# Patient Record
Sex: Female | Born: 1945 | Race: White | Hispanic: No | Marital: Married | Smoking: Never smoker
Health system: Southern US, Community
[De-identification: ages and names within clinical notes are randomized; demographics above are authoritative.]

## PROBLEM LIST (undated history)

## (undated) DIAGNOSIS — K5730 Diverticulosis of large intestine without perforation or abscess without bleeding: Secondary | ICD-10-CM

## (undated) DIAGNOSIS — E039 Hypothyroidism, unspecified: Secondary | ICD-10-CM

## (undated) DIAGNOSIS — Z9889 Other specified postprocedural states: Secondary | ICD-10-CM

## (undated) DIAGNOSIS — E119 Type 2 diabetes mellitus without complications: Secondary | ICD-10-CM

## (undated) DIAGNOSIS — R351 Nocturia: Secondary | ICD-10-CM

## (undated) DIAGNOSIS — E785 Hyperlipidemia, unspecified: Secondary | ICD-10-CM

## (undated) DIAGNOSIS — J45909 Unspecified asthma, uncomplicated: Secondary | ICD-10-CM

## (undated) DIAGNOSIS — I4510 Unspecified right bundle-branch block: Secondary | ICD-10-CM

## (undated) DIAGNOSIS — M5431 Sciatica, right side: Secondary | ICD-10-CM

## (undated) DIAGNOSIS — Z8639 Personal history of other endocrine, nutritional and metabolic disease: Secondary | ICD-10-CM

## (undated) DIAGNOSIS — Z87440 Personal history of urinary (tract) infections: Secondary | ICD-10-CM

## (undated) DIAGNOSIS — N200 Calculus of kidney: Secondary | ICD-10-CM

## (undated) DIAGNOSIS — K219 Gastro-esophageal reflux disease without esophagitis: Secondary | ICD-10-CM

## (undated) DIAGNOSIS — Z87442 Personal history of urinary calculi: Secondary | ICD-10-CM

## (undated) DIAGNOSIS — R112 Nausea with vomiting, unspecified: Secondary | ICD-10-CM

## (undated) DIAGNOSIS — Z8719 Personal history of other diseases of the digestive system: Secondary | ICD-10-CM

## (undated) DIAGNOSIS — T7840XA Allergy, unspecified, initial encounter: Secondary | ICD-10-CM

## (undated) DIAGNOSIS — R7303 Prediabetes: Secondary | ICD-10-CM

## (undated) DIAGNOSIS — I10 Essential (primary) hypertension: Secondary | ICD-10-CM

## (undated) HISTORY — DX: Gastro-esophageal reflux disease without esophagitis: K21.9

## (undated) HISTORY — PX: TONSILLECTOMY: SUR1361

## (undated) HISTORY — DX: Hyperlipidemia, unspecified: E78.5

## (undated) HISTORY — DX: Calculus of kidney: N20.0

## (undated) HISTORY — DX: Allergy, unspecified, initial encounter: T78.40XA

## (undated) HISTORY — DX: Essential (primary) hypertension: I10

## (undated) HISTORY — DX: Type 2 diabetes mellitus without complications: E11.9

---

## 1971-08-30 HISTORY — PX: TUBAL LIGATION: SHX77

## 1971-08-30 HISTORY — PX: OTHER SURGICAL HISTORY: SHX169

## 1972-08-29 HISTORY — PX: APPENDECTOMY: SHX54

## 1979-08-30 HISTORY — PX: OTHER SURGICAL HISTORY: SHX169

## 1993-08-29 HISTORY — PX: VAGINAL HYSTERECTOMY: SUR661

## 1998-02-04 ENCOUNTER — Other Ambulatory Visit: Admission: RE | Admit: 1998-02-04 | Discharge: 1998-02-04 | Payer: Self-pay | Admitting: *Deleted

## 1998-06-16 ENCOUNTER — Other Ambulatory Visit: Admission: RE | Admit: 1998-06-16 | Discharge: 1998-06-16 | Payer: Self-pay | Admitting: Podiatry

## 1999-04-21 ENCOUNTER — Other Ambulatory Visit: Admission: RE | Admit: 1999-04-21 | Discharge: 1999-04-21 | Payer: Self-pay | Admitting: *Deleted

## 2001-03-27 ENCOUNTER — Other Ambulatory Visit: Admission: RE | Admit: 2001-03-27 | Discharge: 2001-03-27 | Payer: Self-pay | Admitting: Internal Medicine

## 2002-12-28 ENCOUNTER — Encounter: Payer: Self-pay | Admitting: Internal Medicine

## 2003-10-22 ENCOUNTER — Encounter (INDEPENDENT_AMBULATORY_CARE_PROVIDER_SITE_OTHER): Payer: Self-pay | Admitting: Specialist

## 2003-10-22 ENCOUNTER — Ambulatory Visit (HOSPITAL_COMMUNITY): Admission: RE | Admit: 2003-10-22 | Discharge: 2003-10-22 | Payer: Self-pay | Admitting: Internal Medicine

## 2004-11-10 ENCOUNTER — Ambulatory Visit: Payer: Self-pay | Admitting: Internal Medicine

## 2004-11-17 ENCOUNTER — Ambulatory Visit: Payer: Self-pay | Admitting: Internal Medicine

## 2004-11-30 ENCOUNTER — Ambulatory Visit: Payer: Self-pay | Admitting: Internal Medicine

## 2004-12-15 ENCOUNTER — Ambulatory Visit: Payer: Self-pay | Admitting: Internal Medicine

## 2004-12-21 ENCOUNTER — Ambulatory Visit: Payer: Self-pay | Admitting: Internal Medicine

## 2004-12-21 LAB — HM COLONOSCOPY

## 2005-02-16 ENCOUNTER — Ambulatory Visit: Payer: Self-pay | Admitting: Internal Medicine

## 2005-06-01 ENCOUNTER — Ambulatory Visit: Payer: Self-pay | Admitting: Internal Medicine

## 2005-06-15 ENCOUNTER — Ambulatory Visit: Payer: Self-pay | Admitting: Internal Medicine

## 2005-08-01 ENCOUNTER — Ambulatory Visit: Payer: Self-pay | Admitting: Internal Medicine

## 2005-08-10 ENCOUNTER — Ambulatory Visit: Payer: Self-pay | Admitting: Internal Medicine

## 2005-08-17 ENCOUNTER — Ambulatory Visit: Payer: Self-pay | Admitting: Internal Medicine

## 2005-12-21 ENCOUNTER — Ambulatory Visit: Payer: Self-pay | Admitting: Internal Medicine

## 2005-12-28 ENCOUNTER — Ambulatory Visit: Payer: Self-pay | Admitting: Internal Medicine

## 2006-03-23 ENCOUNTER — Ambulatory Visit: Payer: Self-pay | Admitting: Internal Medicine

## 2006-03-30 ENCOUNTER — Ambulatory Visit: Payer: Self-pay | Admitting: Internal Medicine

## 2006-07-31 ENCOUNTER — Ambulatory Visit: Payer: Self-pay | Admitting: Internal Medicine

## 2006-10-05 ENCOUNTER — Ambulatory Visit: Payer: Self-pay | Admitting: Internal Medicine

## 2006-10-05 LAB — CONVERTED CEMR LAB
ALT: 24 units/L (ref 0–40)
AST: 27 units/L (ref 0–37)
Hgb A1c MFr Bld: 5.9 % (ref 4.6–6.0)
VLDL: 18 mg/dL (ref 0–40)

## 2006-11-30 ENCOUNTER — Ambulatory Visit: Payer: Self-pay | Admitting: Internal Medicine

## 2007-04-26 ENCOUNTER — Telehealth: Payer: Self-pay | Admitting: Internal Medicine

## 2007-04-27 ENCOUNTER — Encounter: Payer: Self-pay | Admitting: Internal Medicine

## 2007-06-22 ENCOUNTER — Ambulatory Visit: Payer: Self-pay | Admitting: Internal Medicine

## 2007-06-22 LAB — CONVERTED CEMR LAB
Alkaline Phosphatase: 76 units/L (ref 39–117)
BUN: 18 mg/dL (ref 6–23)
Basophils Relative: 0.5 % (ref 0.0–1.0)
Bilirubin, Direct: 0.1 mg/dL (ref 0.0–0.3)
CO2: 30 meq/L (ref 19–32)
Cholesterol: 162 mg/dL (ref 0–200)
GFR calc Af Amer: 93 mL/min
Glucose, Bld: 113 mg/dL — ABNORMAL HIGH (ref 70–99)
Glucose, Urine, Semiquant: NEGATIVE
HDL: 40.4 mg/dL (ref >39.0)
Hemoglobin: 13.3 g/dL (ref 12.0–15.0)
Lymphocytes Relative: 28.3 % (ref 12.0–46.0)
Monocytes Absolute: 0.6 10*3/uL (ref 0.2–0.7)
Monocytes Relative: 7.6 % (ref 3.0–11.0)
Neutro Abs: 4.9 10*3/uL (ref 1.4–7.7)
Potassium: 4.9 meq/L (ref 3.5–5.1)
Protein, U semiquant: NEGATIVE
Specific Gravity, Urine: 1.015
TSH: 2.35 microintl units/mL (ref 0.35–5.50)
Total Protein: 6.7 g/dL (ref 6.0–8.3)
VLDL: 15 mg/dL (ref 0–40)
Vit D, 1,25-Dihydroxy: 48 (ref 30–89)
WBC Urine, dipstick: NEGATIVE
pH: 5.5

## 2007-06-29 ENCOUNTER — Ambulatory Visit: Payer: Self-pay | Admitting: Internal Medicine

## 2007-06-29 DIAGNOSIS — N951 Menopausal and female climacteric states: Secondary | ICD-10-CM | POA: Insufficient documentation

## 2007-06-29 DIAGNOSIS — E039 Hypothyroidism, unspecified: Secondary | ICD-10-CM | POA: Insufficient documentation

## 2007-06-29 DIAGNOSIS — R7309 Other abnormal glucose: Secondary | ICD-10-CM | POA: Insufficient documentation

## 2007-06-29 DIAGNOSIS — J309 Allergic rhinitis, unspecified: Secondary | ICD-10-CM | POA: Insufficient documentation

## 2007-06-29 DIAGNOSIS — I10 Essential (primary) hypertension: Secondary | ICD-10-CM

## 2007-06-29 DIAGNOSIS — E782 Mixed hyperlipidemia: Secondary | ICD-10-CM

## 2007-06-29 DIAGNOSIS — M79609 Pain in unspecified limb: Secondary | ICD-10-CM | POA: Insufficient documentation

## 2007-06-29 DIAGNOSIS — K219 Gastro-esophageal reflux disease without esophagitis: Secondary | ICD-10-CM | POA: Insufficient documentation

## 2007-06-29 LAB — HM MAMMOGRAPHY

## 2007-08-13 ENCOUNTER — Telehealth: Payer: Self-pay | Admitting: *Deleted

## 2007-08-13 ENCOUNTER — Telehealth: Payer: Self-pay | Admitting: Internal Medicine

## 2007-08-27 ENCOUNTER — Ambulatory Visit (HOSPITAL_COMMUNITY): Admission: RE | Admit: 2007-08-27 | Discharge: 2007-08-27 | Payer: Self-pay | Admitting: Urology

## 2007-09-24 ENCOUNTER — Telehealth (INDEPENDENT_AMBULATORY_CARE_PROVIDER_SITE_OTHER): Payer: Self-pay | Admitting: *Deleted

## 2007-12-12 ENCOUNTER — Encounter: Payer: Self-pay | Admitting: Internal Medicine

## 2008-01-08 ENCOUNTER — Telehealth: Payer: Self-pay | Admitting: *Deleted

## 2008-03-06 ENCOUNTER — Ambulatory Visit: Payer: Self-pay | Admitting: Internal Medicine

## 2008-03-13 ENCOUNTER — Ambulatory Visit: Payer: Self-pay | Admitting: Internal Medicine

## 2008-03-13 DIAGNOSIS — R05 Cough: Secondary | ICD-10-CM

## 2008-03-13 DIAGNOSIS — Z87442 Personal history of urinary calculi: Secondary | ICD-10-CM | POA: Insufficient documentation

## 2008-03-18 ENCOUNTER — Encounter: Payer: Self-pay | Admitting: Internal Medicine

## 2008-04-07 ENCOUNTER — Encounter: Payer: Self-pay | Admitting: Internal Medicine

## 2008-04-21 ENCOUNTER — Encounter: Payer: Self-pay | Admitting: Internal Medicine

## 2008-04-25 ENCOUNTER — Ambulatory Visit (HOSPITAL_COMMUNITY): Admission: RE | Admit: 2008-04-25 | Discharge: 2008-04-25 | Payer: Self-pay | Admitting: Surgery

## 2008-04-29 ENCOUNTER — Telehealth: Payer: Self-pay | Admitting: Internal Medicine

## 2008-05-01 ENCOUNTER — Encounter: Payer: Self-pay | Admitting: Internal Medicine

## 2008-06-06 ENCOUNTER — Encounter: Payer: Self-pay | Admitting: Internal Medicine

## 2008-06-16 ENCOUNTER — Ambulatory Visit: Payer: Self-pay | Admitting: Internal Medicine

## 2008-06-16 DIAGNOSIS — N390 Urinary tract infection, site not specified: Secondary | ICD-10-CM

## 2008-06-16 LAB — CONVERTED CEMR LAB
Blood in Urine, dipstick: NEGATIVE
Glucose, Urine, Semiquant: NEGATIVE
Ketones, urine, test strip: NEGATIVE

## 2008-07-29 ENCOUNTER — Telehealth: Payer: Self-pay | Admitting: Internal Medicine

## 2008-08-14 ENCOUNTER — Encounter: Payer: Self-pay | Admitting: Internal Medicine

## 2008-08-14 ENCOUNTER — Encounter (INDEPENDENT_AMBULATORY_CARE_PROVIDER_SITE_OTHER): Payer: Self-pay | Admitting: Surgery

## 2008-08-14 ENCOUNTER — Ambulatory Visit (HOSPITAL_COMMUNITY): Admission: RE | Admit: 2008-08-14 | Discharge: 2008-08-14 | Payer: Self-pay | Admitting: Surgery

## 2008-08-14 HISTORY — PX: OTHER SURGICAL HISTORY: SHX169

## 2008-09-04 ENCOUNTER — Encounter: Payer: Self-pay | Admitting: Internal Medicine

## 2008-09-20 ENCOUNTER — Encounter: Admission: RE | Admit: 2008-09-20 | Discharge: 2008-09-20 | Payer: Self-pay | Admitting: Orthopaedic Surgery

## 2008-10-21 ENCOUNTER — Encounter: Payer: Self-pay | Admitting: Internal Medicine

## 2008-11-27 HISTORY — PX: KNEE ARTHROSCOPY: SUR90

## 2008-12-11 ENCOUNTER — Ambulatory Visit: Payer: Self-pay | Admitting: Internal Medicine

## 2008-12-11 LAB — CONVERTED CEMR LAB
AST: 33 units/L (ref 0–37)
Albumin: 3.8 g/dL (ref 3.5–5.2)
Basophils Absolute: 0 10*3/uL (ref 0.0–0.1)
CO2: 33 meq/L — ABNORMAL HIGH (ref 19–32)
Eosinophils Absolute: 0.3 10*3/uL (ref 0.0–0.7)
GFR calc non Af Amer: 76.55 mL/min (ref >60)
Glucose, Bld: 119 mg/dL — ABNORMAL HIGH (ref 70–99)
HCT: 38.7 % (ref 36.0–46.0)
HDL: 38.2 mg/dL — ABNORMAL LOW (ref >39.00)
Hgb A1c MFr Bld: 6.4 % (ref 4.6–6.5)
Lymphs Abs: 2.2 10*3/uL (ref 0.7–4.0)
MCHC: 34.8 g/dL (ref 30.0–36.0)
Monocytes Absolute: 0.6 10*3/uL (ref 0.1–1.0)
Monocytes Relative: 6.8 % (ref 3.0–12.0)
Platelets: 257 10*3/uL (ref 150.0–400.0)
Potassium: 4.5 meq/L (ref 3.5–5.1)
RDW: 12.8 % (ref 11.5–14.6)
Sodium: 145 meq/L (ref 135–145)
TSH: 2.43 microintl units/mL (ref 0.35–5.50)
Total Bilirubin: 0.6 mg/dL (ref 0.3–1.2)
Triglycerides: 174 mg/dL — ABNORMAL HIGH (ref 0.0–149.0)
VLDL: 34.8 mg/dL (ref 0.0–40.0)

## 2008-12-23 ENCOUNTER — Ambulatory Visit: Payer: Self-pay | Admitting: Internal Medicine

## 2008-12-23 DIAGNOSIS — E669 Obesity, unspecified: Secondary | ICD-10-CM

## 2009-01-07 ENCOUNTER — Encounter: Payer: Self-pay | Admitting: Internal Medicine

## 2009-02-19 ENCOUNTER — Ambulatory Visit (HOSPITAL_COMMUNITY): Admission: RE | Admit: 2009-02-19 | Discharge: 2009-02-25 | Payer: Self-pay | Admitting: Orthopaedic Surgery

## 2009-03-04 ENCOUNTER — Encounter (HOSPITAL_COMMUNITY): Admission: RE | Admit: 2009-03-04 | Discharge: 2009-04-03 | Payer: Self-pay | Admitting: Orthopaedic Surgery

## 2009-04-10 ENCOUNTER — Telehealth: Payer: Self-pay | Admitting: *Deleted

## 2009-04-18 ENCOUNTER — Ambulatory Visit: Payer: Self-pay | Admitting: Family Medicine

## 2009-04-18 LAB — CONVERTED CEMR LAB
Bilirubin Urine: NEGATIVE
Blood in Urine, dipstick: NEGATIVE
Glucose, Urine, Semiquant: NEGATIVE
Ketones, urine, test strip: NEGATIVE
pH: 6

## 2009-04-19 ENCOUNTER — Encounter: Payer: Self-pay | Admitting: Internal Medicine

## 2009-04-21 ENCOUNTER — Ambulatory Visit: Payer: Self-pay | Admitting: Internal Medicine

## 2009-04-21 LAB — CONVERTED CEMR LAB: Hgb A1c MFr Bld: 6.3 % (ref 4.6–6.5)

## 2009-04-24 ENCOUNTER — Telehealth: Payer: Self-pay | Admitting: *Deleted

## 2009-05-15 ENCOUNTER — Ambulatory Visit: Payer: Self-pay | Admitting: Internal Medicine

## 2009-07-08 ENCOUNTER — Ambulatory Visit: Payer: Self-pay | Admitting: Internal Medicine

## 2009-09-04 ENCOUNTER — Telehealth: Payer: Self-pay | Admitting: *Deleted

## 2009-09-10 ENCOUNTER — Ambulatory Visit: Payer: Self-pay | Admitting: Internal Medicine

## 2009-09-22 ENCOUNTER — Telehealth: Payer: Self-pay | Admitting: *Deleted

## 2009-11-11 ENCOUNTER — Encounter: Payer: Self-pay | Admitting: Internal Medicine

## 2009-11-17 ENCOUNTER — Inpatient Hospital Stay (HOSPITAL_COMMUNITY): Admission: RE | Admit: 2009-11-17 | Discharge: 2009-11-19 | Payer: Self-pay | Admitting: Orthopaedic Surgery

## 2009-11-17 HISTORY — PX: TOTAL KNEE ARTHROPLASTY: SHX125

## 2009-12-08 ENCOUNTER — Telehealth: Payer: Self-pay | Admitting: *Deleted

## 2009-12-28 ENCOUNTER — Encounter (HOSPITAL_COMMUNITY): Admission: RE | Admit: 2009-12-28 | Discharge: 2010-01-27 | Payer: Self-pay | Admitting: Orthopaedic Surgery

## 2010-01-26 ENCOUNTER — Ambulatory Visit: Payer: Self-pay | Admitting: Internal Medicine

## 2010-01-26 LAB — CONVERTED CEMR LAB
Albumin: 4.2 g/dL (ref 3.5–5.2)
Basophils Absolute: 0 10*3/uL (ref 0.0–0.1)
Basophils Relative: 0.4 % (ref 0.0–3.0)
Bilirubin Urine: NEGATIVE
Blood in Urine, dipstick: NEGATIVE
CO2: 32 meq/L (ref 19–32)
Calcium: 10.4 mg/dL (ref 8.4–10.5)
Chloride: 100 meq/L (ref 96–112)
Creatinine, Ser: 1 mg/dL (ref 0.4–1.2)
Eosinophils Absolute: 0.3 10*3/uL (ref 0.0–0.7)
Glucose, Bld: 115 mg/dL — ABNORMAL HIGH (ref 70–99)
Glucose, Urine, Semiquant: NEGATIVE
HDL: 44.3 mg/dL (ref >39.00)
Hemoglobin: 13 g/dL (ref 12.0–15.0)
Ketones, urine, test strip: NEGATIVE
Lymphocytes Relative: 25.8 % (ref 12.0–46.0)
MCHC: 33.9 g/dL (ref 30.0–36.0)
MCV: 88.9 fL (ref 78.0–100.0)
Monocytes Absolute: 0.6 10*3/uL (ref 0.1–1.0)
Neutro Abs: 6 10*3/uL (ref 1.4–7.7)
RBC: 4.32 M/uL (ref 3.87–5.11)
RDW: 14.5 % (ref 11.5–14.6)
Sodium: 143 meq/L (ref 135–145)
Specific Gravity, Urine: 1.02
TSH: 2.28 microintl units/mL (ref 0.35–5.50)
Total CHOL/HDL Ratio: 4
Total Protein: 7.3 g/dL (ref 6.0–8.3)
Triglycerides: 157 mg/dL — ABNORMAL HIGH (ref 0.0–149.0)
pH: 6

## 2010-01-28 ENCOUNTER — Encounter: Payer: Self-pay | Admitting: Internal Medicine

## 2010-01-29 ENCOUNTER — Encounter (HOSPITAL_COMMUNITY): Admission: RE | Admit: 2010-01-29 | Discharge: 2010-02-28 | Payer: Self-pay | Admitting: Orthopaedic Surgery

## 2010-02-02 ENCOUNTER — Ambulatory Visit: Payer: Self-pay | Admitting: Internal Medicine

## 2010-03-15 ENCOUNTER — Telehealth: Payer: Self-pay | Admitting: *Deleted

## 2010-03-24 ENCOUNTER — Telehealth: Payer: Self-pay | Admitting: *Deleted

## 2010-04-19 ENCOUNTER — Encounter: Payer: Self-pay | Admitting: Internal Medicine

## 2010-04-23 ENCOUNTER — Encounter: Payer: Self-pay | Admitting: Internal Medicine

## 2010-08-03 ENCOUNTER — Ambulatory Visit: Payer: Self-pay | Admitting: Internal Medicine

## 2010-08-03 DIAGNOSIS — L299 Pruritus, unspecified: Secondary | ICD-10-CM | POA: Insufficient documentation

## 2010-08-04 LAB — CONVERTED CEMR LAB
Alkaline Phosphatase: 84 units/L (ref 39–117)
Bilirubin, Direct: 0.1 mg/dL (ref 0.0–0.3)
Calcium: 9.7 mg/dL (ref 8.4–10.5)
GFR calc non Af Amer: 71.02 mL/min (ref >60.00)
Hgb A1c MFr Bld: 6.6 % — ABNORMAL HIGH (ref 4.6–6.5)
Potassium: 4.6 meq/L (ref 3.5–5.1)
Sodium: 143 meq/L (ref 135–145)
TSH: 2.98 microintl units/mL (ref 0.35–5.50)
Total Bilirubin: 0.4 mg/dL (ref 0.3–1.2)
Total Protein: 7 g/dL (ref 6.0–8.3)

## 2010-08-18 ENCOUNTER — Telehealth: Payer: Self-pay | Admitting: *Deleted

## 2010-09-07 ENCOUNTER — Telehealth: Payer: Self-pay | Admitting: *Deleted

## 2010-09-26 LAB — CONVERTED CEMR LAB
CO2: 29 meq/L (ref 19–32)
Calcium: 10.6 mg/dL — ABNORMAL HIGH (ref 8.4–10.5)
Creatinine, Ser: 0.8 mg/dL (ref 0.4–1.2)
Glucose, Bld: 99 mg/dL (ref 70–99)
Vit D, 1,25-Dihydroxy: 33 (ref 30–89)

## 2010-09-30 NOTE — Progress Notes (Signed)
Summary: refills  Phone Note Call from Patient Call back at Home Phone [phone]   Caller: Patient Summary of Call: Pt needs simvastatin and omeparazole fax to [facility].  Initial call taken by: Romualdo Bolk, CMA Duncan Dull),  March 15, 2010 4:50 PM  Follow-up for Phone Call        Phone Call Completed Follow-up by: Romualdo Bolk, CMA Duncan Dull),  March 15, 2010 4:50 PM    Prescriptions: OMEPRAZOLE 20 MG CPDR (OMEPRAZOLE) 1 by mouth once daily  #90 x 3   Entered by:   Romualdo Bolk, CMA (AAMA)   Authorized by:   Madelin Headings MD   Signed by:   Romualdo Bolk, CMA (AAMA) on 03/15/2010   Method used:   Electronically to        [facility] (retail)       [address] [place], [place]  [address]       Ph: [phone]       Fax: [phone]   RxID:   [id number] SIMVASTATIN 40 MG TABS (SIMVASTATIN) 1 by mouth once daily  #90 x 3   Entered by:   Romualdo Bolk, CMA (AAMA)   Authorized by:   Madelin Headings MD   Signed by:   Romualdo Bolk, CMA (AAMA) on 03/15/2010   Method used:   Electronically to        [facility] (retail)       [address] [place], [place]  [address]       Ph: [phone]       Fax: [phone]   RxID:   [id number]

## 2010-09-30 NOTE — Assessment & Plan Note (Signed)
Summary: 6 month rov/njr   Vital Signs:  Patient profile:   65 year old female Menstrual status:  hysterectomy Weight:      183 pounds Pulse rate:   66 / minute BP sitting:   120 / 80  (left arm) Cuff size:   regular  Vitals Entered By: Romualdo Bolk, CMA (AAMA) (August 03, 2010 8:11 AM) CC: Follow-up visit , Hypertension Management- Simvastatin causes itchy   History of Present Illness: Kerry Walters comes in today  for follow up of multiple medical problems . She feels well  but bothered  by itching   at times she thinks is from medication .  onset of itching   after 2 weeks  of  taking the simvastatin when she began taking the medicine in the morning she started doing better.  she had no other changes in no rash she is tolerating this problem. HT : doing well no change in medication. Thyroid:  taking medication every day. No chest pain shortness of breath ...... obesity: has had a problem losing weight tends to like her sweets. She plans on doing better.  Hypertension History:      She complains of side effects from treatment, but denies headache, chest pain, palpitations, dyspnea with exertion, orthopnea, PND, peripheral edema, visual symptoms, neurologic problems, and syncope.  She notes the following problems with antihypertensive medication side effects: simvastatin makes her itch.        Positive major cardiovascular risk factors include female age 63 years old or older, hyperlipidemia, and hypertension.  Negative major cardiovascular risk factors include non-tobacco-user status.        Further assessment for target organ damage reveals no history of ASHD, cardiac end-organ damage (CHF/LVH), stroke/TIA, peripheral vascular disease, or renal insufficiency.     Preventive Screening-Counseling & Management  Alcohol-Tobacco     Alcohol drinks/day: 0     Smoking Status: never  Caffeine-Diet-Exercise     Caffeine use/day: 1     Does Patient Exercise: yes     Type of  exercise: water aerobics  Current Medications (verified): 1)  Simvastatin 40 Mg Tabs (Simvastatin) .[name].. 1 By Mouth Once Daily 2)  Levothyroxine Sodium 75 Mcg Tabs (Levothyroxine Sodium) .... Take 1 Tablet By Mouth Once A Day 3)  Triamterene-Hctz 37.5-25 Mg Tabs (Triamterene-Hctz) .... Take 1 Tablet By Mouth Once A Day 4)  Omeprazole 20 Mg Cpdr (Omeprazole) .[name].. 1 By Mouth Once Daily 5)  Amoxicillin 500 Mg Caps (Amoxicillin) .... Take 4 Caps By Mouth 30-60 Mins Pre Procedure  Allergies (verified): 1)  Codeine Phosphate (Codeine Phosphate)  Past History:  Past medical, surgical, family and social histories (including risk factors) reviewed for relevance to current acute and chronic problems.  Past Medical History: Reviewed history from 12/23/2008 and no changes required. Allergic rhinitis GERD hx of stricture  2005  Hyperlipidemia Hypertension Hypothyroidism g2p2 Nephrolithiasis, hx of increase FBS  2007  [facility]  [facility]     Past Surgical History: Reviewed history from 02/02/2010 and no changes required. Appendectomy   fibroids and endometriosis   and total  Tonsillectomy   Parathryroidectomy 12/09 knee surgery  april 2010  Past History:  Care Management: Orthopedics: Dr. Cleophas Dunker- Surgery on Knee done 12/25/08. Gastroenterology: Lowella Curb : urology  [facility]   Family History: Reviewed history from 12/23/2008 and no changes required. Family History Diabetes 1st degree relative braon cancer liver cancer       Social History: Reviewed history from 02/02/2010 and no changes  required. Married Never Smoked Alcohol use-no  HHof 2     3 dogs     Review of Systems  The patient denies anorexia, fever, weight loss, vision loss, decreased hearing, dyspnea on exertion, peripheral edema, prolonged cough, headaches, abdominal pain, abnormal bleeding, enlarged lymph nodes, and angioedema.         left knee doing ok.  Physical Exam  General:   Well-developed,well-nourished,in no acute distress; alert,appropriate and cooperative throughout examination Head:  normocephalic and atraumatic.   Eyes:  vision grossly intact, pupils equal, and pupils round.   Neck:  No deformities, masses, or tenderness noted. Lungs:  Normal respiratory effort, chest expands symmetrically. Lungs are clear to auscultation, no crackles or wheezes. Heart:  Normal rate and regular rhythm. S1 and S2 normal without gallop, murmur, click, rub or other extra sounds. Abdomen:  Bowel sounds positive,abdomen soft and non-tender without masses, organomegaly or   noted. Pulses:  pulses intact without delay   Neurologic:  grossly non focal  Skin:  turgor normal, color normal, no ecchymoses, and no petechiae.   Cervical Nodes:  No lymphadenopathy noted Psych:  Oriented X3, good eye contact, not anxious appearing, and not depressed appearing.     Impression & Recommendations:  Problem # 1:  HYPERGLYCEMIA, FASTING (ICD-790.29)  recheck  is fasting today  Orders: TLB-A1C / Hgb A1C (Glycohemoglobin) (83036-A1C) TLB-BMP (Basic Metabolic Panel-BMET) (80048-METABOL) Specimen Handling (51884) Venipuncture (16606)  Labs Reviewed: Creat: 1.0 (01/26/2010)     Problem # 2:  HYPERLIPIDEMIA (ICD-272.2) changed med for cost reasons from crestor    ? if itching a se no other se  Her updated medication list for this problem includes:    Simvastatin 40 Mg Tabs (Simvastatin) .[name]... 1 by mouth once daily  Orders: TLB-Hepatic/Liver Function Pnl (80076-HEPATIC) Specimen Handling (30160) Venipuncture (10932) Prescription Created Electronically [phone])  Labs Reviewed: SGOT: 32 (01/26/2010)   SGPT: 34 (01/26/2010)  Lipid Goals: Chol Goal: 200 (06/29/2007)   HDL Goal: 40 (06/29/2007)   LDL Goal: 130 (06/29/2007)   TG Goal: 150 (06/29/2007)  Prior 10 Yr Risk Heart Disease: 13 % (02/02/2010)   HDL:44.30 (01/26/2010), 38.20 (12/11/2008)  LDL:120 (01/26/2010), 118 (12/11/2008)   Chol:196 (01/26/2010), 191 (12/11/2008)  Trig:157.0 (01/26/2010), 174.0 (12/11/2008)  Problem # 3:  HYPERTENSION (ICD-401.9) controlled  Her updated medication list for this problem includes:    Triamterene-hctz 37.5-25 Mg Tabs (Triamterene-hctz) .[name]... Take 1 tablet by mouth once a day  BP today: 120/80 Prior BP: 120/78 (02/02/2010)  Prior 10 Yr Risk Heart Disease: 13 % (02/02/2010)  Labs Reviewed: K+: 4.4 (01/26/2010) Creat: : 1.0 (01/26/2010)   Chol: 196 (01/26/2010)   HDL: 44.30 (01/26/2010)   LDL: 120 (01/26/2010)   TG: 157.0 (01/26/2010)  Problem # 4:  PRURITUS (ICD-698.9) Assessment: New pt feels from medication  as timing cw this.  no other alarm signs  no rashes noted Orders: TLB-TSH (Thyroid Stimulating Hormone) (84443-TSH) TLB-Hepatic/Liver Function Pnl (80076-HEPATIC) Specimen Handling (22025) Venipuncture (42706)  Problem # 5:  OBESITY (ICD-278.00) counseled  Ht: 62.25 (02/02/2010)   Wt: 183 (08/03/2010)   BMI: 32.23 (02/02/2010)  Complete Medication List: 1)  Simvastatin 40 Mg Tabs (Simvastatin) .[name].. 1 by mouth once daily 2)  Levothyroxine Sodium 75 Mcg Tabs (Levothyroxine sodium) .... Take 1 tablet by mouth once a day 3)  Triamterene-hctz 37.5-25 Mg Tabs (Triamterene-hctz) .... Take 1 tablet by mouth once a day 4)  Omeprazole 20 Mg Cpdr (Omeprazole) .[name].. 1 by mouth once daily 5)  Amoxicillin 500 Mg Caps (  Amoxicillin) .... Take 4 caps by mouth 30-60 mins pre procedure  Other Orders: Flu Vaccine 59yrs + MEDICARE PATIENTS (Z6109) Administration Flu vaccine - MCR (U0454)  Hypertension Assessment/Plan:      The patient's hypertensive risk group is category B: At least one risk factor (excluding diabetes) with no target organ damage.  Her calculated 10 year risk of coronary heart disease is 13 %.  Today's blood pressure is 120/80.  Her blood pressure goal is < 140/90.  Patient Instructions: 1)  consider    trial off of simvastatin  to see if itching goes away.  2)   And then if rechallendge and comes back it is the medication. 3)  Consider change to lipitor   as this is now generic 4)  call if want to switch .  5)  You will be informed of lab results when available.  6)  otherwise yearly  check up in a year  Prescriptions: TRIAMTERENE-HCTZ 37.5-25 MG TABS (TRIAMTERENE-HCTZ) Take 1 tablet by mouth once a day  #90 x 3   Entered and Authorized by:   Madelin Headings MD   Signed by:   Madelin Headings MD on 08/03/2010   Method used:   Electronically to        [facility] (retail)       [address] [place], [place]  [address]       Ph: [phone]       Fax: [phone]   RxID:   [id number] LEVOTHYROXINE SODIUM 75 MCG TABS (LEVOTHYROXINE SODIUM) Take 1 tablet by mouth once a day  #90 x 3   Entered and Authorized by:   Madelin Headings MD   Signed by:   Madelin Headings MD on 08/03/2010   Method used:   Electronically to        [facility] ([facility]ail)       [address] [place], [place]  [address]       Ph: [phone]       Fax: [phone]   RxID:   [id number]    Orders Added: 1)  Flu Vaccine 68yrs + MEDICARE PATIENTS [Q2039] 2)  Administration Flu vaccine - MCR [G0008] 3)  TLB-A1C / Hgb A1C (Glycohemoglobin) [83036-A1C] 4)  TLB-BMP (Basic Metabolic Panel-BMET) [80048-METABOL] 5)  TLB-TSH (Thyroid Stimulating Hormone) [84443-TSH] 6)  TLB-Hepatic/Liver Function Pnl [80076-HEPATIC] 7)  Specimen Handling [99000] 8)  Venipuncture [36415] 9)  Est. Patient Level IV [16010] 10)  Prescription Created Electronically [X3235] Flu Vaccine Consent Questions     Do you have a history of severe allergic reactions to this vaccine? no    Any prior history of allergic reactions to egg and/or gelatin? no    Do you have a sensitivity to the preservative Thimersol? no    Do you have a past history of Guillan-Barre Syndrome? no    Do you currently have an acute febrile illness? no    Have you ever had a severe reaction to latex? no    Vaccine  information given and explained to patient? yes    Are you currently pregnant? no    Lot Number:[id number]   Exp Date:02/26/2011   Manufacturer: [employer]    Site Given  Left Deltoid IM Romualdo Bolk, CMA (AAMA)  August 03, 2010 8:15 AM  istration Flu vaccine - MCR [G0008]       .medflu

## 2010-09-30 NOTE — Letter (Signed)
Summary: Request for Surgical Clearance/[facility]  Request for Surgical Clearance/[facility]   Imported By: Maryln Gottron 09/15/2009 13:26:41  _____________________________________________________________________  External Attachment:    Type:   Image     Comment:   External Document

## 2010-09-30 NOTE — Assessment & Plan Note (Signed)
Summary: m30 visit per shannon//ccm   Vital Signs:  Patient profile:   65 year old female Menstrual status:  hysterectomy Height:      62.25 inches Weight:      177 pounds BMI:     32.23 Pulse rate:   66 / minute BP sitting:   120 / 78  (left arm) Cuff size:   regular  Vitals Entered By: Romualdo Bolk, CMA (AAMA) (February 02, 2010 11:25 AM) CC: Follow-up visit on labs, Hypertension Management   History of Present Illness: Kerry Walters   comesin  for  yearly visit  wellness evaluation and med management . Since last visit  here  there have been no major changes in health status   She did have her surgery on her knee and doing better  Here for Medicare AWV:  1.   Risk factors based on Past M, S, F history: HT lipids weight BG  assesssed  2.   Physical Activities: counseled about exercise   no acute limitations 3.   Depression/mood:  doing well  4.   Hearing:   ok   no change  5.   ADL's: independent  6.   Fall Risk: assessed and no falling 7.   Home Safety:  reviewed  8.   Height, weight, &visual acuity: vision no  change  eye doc is Dr Dione Booze  9.   Counseling:  see below   in CPOA 10.   Labs ordered based on risk factors:  get dexa  11.           Referral Coordination   12.           Care Plan :see below 13.            Cognitive Assessment .   Pt is A&Ox3,affect,speech,memory,attention,&motor skills appear intact.  GERD :  ocass two times a day  ocass cough   LIPIDS:   ok  nose of meds DJD:  sp knee surgery this spring   HT stable. on medications Allergies no change HYperglycemia :  aware  of diet and exercise and weight loss as rx .  Hypertension History:      She denies headache, chest pain, palpitations, dyspnea with exertion, orthopnea, PND, peripheral edema, visual symptoms, neurologic problems, syncope, and side effects from treatment.  She notes no problems with any antihypertensive medication side effects.        Positive major cardiovascular risk factors include  female age 74 years old or older, hyperlipidemia, and hypertension.  Negative major cardiovascular risk factors include non-tobacco-user status.        Further assessment for target organ damage reveals no history of ASHD, cardiac end-organ damage (CHF/LVH), stroke/TIA, peripheral vascular disease, or renal insufficiency.    Preventive Care Screening  Mammogram:    Date:  01/28/2010    Results:  normal   Colonoscopy:    Date:  12/21/2004    Results:  normal   Last Tetanus Booster:    Date:  08/30/2003    Results:  Historical   Prior Values:    Last Flu Shot:  Fluvax 3+ (07/08/2009)   Contraindications/Deferment of Procedures/Staging:    Test/Procedure: Pneumovax vaccine    Reason for deferment: patient declined     Test/Procedure: PAP Smear    Reason for deferment: hysterectomy    Preventive Screening-Counseling & Management  Alcohol-Tobacco     Alcohol drinks/day: 0     Smoking Status: never  Caffeine-Diet-Exercise  Caffeine use/day: 1     Does Patient Exercise: yes     Type of exercise: water aerobics  Hep-HIV-STD-Contraception     Dental Visit-last 6 months yes     Sun Exposure-Excessive: no  Safety-Violence-Falls     Seat Belt Use: yes     Firearms in the Home: firearms in the home     Firearm Counseling: not indicated; uses recommended firearm safety measures     Smoke Detectors: yes     Fall Risk: none       Blood Transfusions:  no.    Current Medications (verified): 1)  Simvastatin 40 Mg Tabs (Simvastatin) .[name].. 1 By Mouth Once Daily 2)  Levothyroxine Sodium 75 Mcg Tabs (Levothyroxine Sodium) .... Take 1 Tablet By Mouth Once A Day 3)  Triamterene-Hctz 37.5-25 Mg Tabs (Triamterene-Hctz) .... Take 1 Tablet By Mouth Once A Day 4)  Omeprazole 20 Mg Cpdr (Omeprazole) .[name].. 1 By Mouth Once Daily  Allergies (verified): 1)  Codeine Phosphate (Codeine Phosphate)  Past History:  Past medical, surgical, family and social histories (including risk factors)  reviewed, and no changes noted (except as noted below).  Past Medical History: Reviewed history from 12/23/2008 and no changes required. Allergic rhinitis GERD hx of stricture  2005  Hyperlipidemia Hypertension Hypothyroidism g2p2 Nephrolithiasis, hx of increase FBS  2007  [facility]     Past Surgical History: Appendectomy   fibroids and endometriosis   and total  Tonsillectomy   Parathryroidectomy 12/09 knee surgery  april 2010  Past History:  Care Management: Orthopedics: Dr. Cleophas Dunker- Surgery on Knee done 12/25/08. Gastroenterology: Lowella Curb : urology  [facility]   Family History: Reviewed history from 12/23/2008 and no changes required. Family History Diabetes 1st degree relative braon cancer liver cancer       Social History: Reviewed history from 12/23/2008 and no changes required. Married Never Smoked Alcohol use-no  HHof 2     3 dogs   Seat Belt Use:  yes Dental Care w/in 6 mos.:  yes Sun Exposure-Excessive:  no Fall Risk:  none  Blood Transfusions:  no  Review of Systems  The patient denies anorexia, fever, weight loss, weight gain, vision loss, decreased hearing, chest pain, syncope, dyspnea on exertion, headaches, melena, hematochezia, severe indigestion/heartburn, muscle weakness, transient blindness, abnormal bleeding, enlarged lymph nodes, angioedema, and breast masses.         reflux cough on meds  Physical Exam  General:  alert, well-developed, well-nourished, and well-hydrated.   Head:  normocephalic, atraumatic, no abnormalities observed, and no abnormalities palpated.   Eyes:  PERRL, EOMs full, conjunctiva clear  Ears:  R ear normal, L ear normal, and no external deformities.   Nose:  no external deformity, no external erythema, and no nasal discharge.   Mouth:  good dentition and pharynx pink and moist.   Neck:  No deformities, masses, or tenderness noted. Chest Wall:  No deformities, masses, or tenderness  noted. Breasts:  No mass, nodules, thickening, tenderness, bulging, retraction, inflamation, nipple discharge or skin changes noted.   Lungs:  Normal respiratory effort, chest expands symmetrically. Lungs are clear to auscultation, no crackles or wheezes.no dullness.   Heart:  Normal rate and regular rhythm. S1 and S2 normal without gallop, murmur, click, rub or other extra sounds.no lifts.   Abdomen:  Bowel sounds positive,abdomen soft and non-tender without masses, organomegaly or   noted. Genitalia:  had hysterectomy  Msk:  healed lwft knee  no redness milninmal swelling  LOM  rest of jpoint no acut changes  Pulses:  pulses intact without delay   Extremities:  no clubbing cyanosis or edema  Neurologic:  slight antallgic alert & oriented X3 and strength normal in all extremities.   Skin:  turgor normal, color normal, no ecchymoses, no petechiae, and no purpura.   Cervical Nodes:  No lymphadenopathy notedno anterior cervical adenopathy and no posterior cervical adenopathy.   Axillary Nodes:  no R axillary adenopathy and no L axillary adenopathy.   Inguinal Nodes:  No significant adenopathyno R inguinal adenopathy and no L inguinal adenopathy.   Psych:  Pt is A&Ox3,affect,speech,memory,attention,&motor skills appear intact.  EKG dated November 01 2009 from Dr Joni Reining office: SInus srhytym  [employer] RBBB  no changes from previous ekg   Impression & Recommendations:  Problem # 1:  PREVENTIVE HEALTH CARE (ICD-V70.0)  Discussed nutrition,exercise,diet,healthy weight, vitamin D and calcium.          Orders: First annual wellness visit with prevention plan  [phone])  Problem # 2:  HYPERLIPIDEMIA (ICD-272.2)  acceptible but could be better   lifestyle intervention  Her updated medication list for this problem includes:    Simvastatin 40 Mg Tabs (Simvastatin) .[name]... 1 by mouth once daily  Labs Reviewed: SGOT: 32 (01/26/2010)   SGPT: 34 (01/26/2010)  Lipid Goals: Chol Goal: 200 (06/29/2007)    HDL Goal: 40 (06/29/2007)   LDL Goal: 130 (06/29/2007)   TG Goal: 150 (06/29/2007)  10 Yr Risk Heart Disease: 13 % Prior 10 Yr Risk Heart Disease: 17 % (06/29/2007)   HDL:44.30 (01/26/2010), 38.20 (12/11/2008)  LDL:120 (01/26/2010), 118 (12/11/2008)  Chol:196 (01/26/2010), 191 (12/11/2008)  Trig:157.0 (01/26/2010), 174.0 (12/11/2008)  Orders: First annual wellness visit with prevention plan  (Q4696)  Problem # 3:  HYPERTENSION (ICD-401.9)  Her updated medication list for this problem includes:    Triamterene-hctz 37.5-25 Mg Tabs (Triamterene-hctz) .[name]... Take 1 tablet by mouth once a day  BP today: 120/78 Prior BP: 150/90 (05/15/2009)  10 Yr Risk Heart Disease: 13 % Prior 10 Yr Risk Heart Disease: 17 % (06/29/2007)  Labs Reviewed: K+: 4.4 (01/26/2010) Creat: : 1.0 (01/26/2010)   Chol: 196 (01/26/2010)   HDL: 44.30 (01/26/2010)   LDL: 120 (01/26/2010)   TG: 157.0 (01/26/2010)  Orders: First annual wellness visit with prevention plan  (E9528)  Problem # 4:  GERD (ICD-530.81)  stable  Her updated medication list for this problem includes:    Omeprazole 20 Mg Cpdr (Omeprazole) .[name]... 1 by mouth once daily  Orders: First annual wellness visit with prevention plan  [phone])  Problem # 5:  HYPERGLYCEMIA, FASTING (ICD-790.29) Assessment: Unchanged  lifestyle intervention   Orders: First annual wellness visit with prevention plan  (M0102)  Problem # 6:  ALLERGIC RHINITIS (ICD-477.9)  stable   Orders: First annual wellness visit with prevention plan  (V2536)  Problem # 7:  DJD  no change   Complete Medication List: 1)  Simvastatin 40 Mg Tabs (Simvastatin) .[name].. 1 by mouth once daily 2)  Levothyroxine Sodium 75 Mcg Tabs (Levothyroxine sodium) .... Take 1 tablet by mouth once a day 3)  Triamterene-hctz 37.5-25 Mg Tabs (Triamterene-hctz) .... Take 1 tablet by mouth once a day 4)  Omeprazole 20 Mg Cpdr (Omeprazole) .[name].. 1 by mouth once daily  Hypertension Assessment/Plan:       The patient's hypertensive risk group is category B: At least one risk factor (excluding diabetes) with no target organ damage.  Her calculated 10 year risk of coronary heart disease is  13 %.  Today's blood pressure is 120/78.  Her blood pressure goal is < 140/90.  Patient Instructions: 1)  continued weight loss will help your knee lipids and blood sugar. 2)  Limit sweets and simple carbohydrateds  3)  No change in medications today.  4)  Get a dexa scan and will notify you of results. 5)  calcium 1000-1200 mg per day of calcium in diet  or supplements.  6)  Vitamin d [phone] international units vit d  per day. 7)  ROV in 6 months . 8)  DEXA scan  Preventive Care Screening  Mammogram:    Date:  01/28/2010    Results:  normal   Colonoscopy:    Date:  12/21/2004    Results:  normal   Last Tetanus Booster:    Date:  08/30/2003    Results:  Historical   Prior Values:    Last Flu Shot:  Fluvax 3+ (07/08/2009)

## 2010-09-30 NOTE — Progress Notes (Signed)
Summary: change crestor to something cheapier  Phone Note Call from Patient Call back at Home Phone [phone]   Caller: Patient Summary of Call: Pt wants to change crestor to something cheaper due to cost. She also needs a refill on omeprazole sent [facility]. Rx sent to [facility] for the omeprazole. Pt has an appt in May.  Initial call taken by: Romualdo Bolk, CMA Duncan Dull),  December 08, 2009 3:15 PM  Follow-up for Phone Call        she can change to  a les potent statin medication to try.  simvastatin 40 mg  1 by mouth once daily  disp 30 refill x 3  then get LIPIDS LFTS done in 6-8 weeks on meds .  Then OV . looks like she is  due for other labs at that time ...bmp, hg a1c , tsh .  Ok to refill med omeprezole until  next visit  Follow-up by: Madelin Headings MD,  December 08, 2009 5:27 PM  Additional Follow-up for Phone Call Additional follow up Details #1::        LMTOCB- Rx sent to [facility]. Also lets reschedule rov in may since she is due for labs. So we can do labs in 6-8 weeks with a rov afterwards. Additional Follow-up by: Romualdo Bolk, CMA Duncan Dull),  December 09, 2009 8:56 AM    Additional Follow-up for Phone Call Additional follow up Details #2::    LMTOCB Romualdo Bolk, CMA Duncan Dull)  December 16, 2009 8:53 AM Left message on machine to cancel may's appt and schedule labs in 6-8 weeks with a rov afterwards. Follow-up by: Romualdo Bolk, CMA (AAMA),  December 16, 2009 11:39 AM  New/Updated Medications: SIMVASTATIN 40 MG TABS (SIMVASTATIN) 1 by mouth once daily Prescriptions: SIMVASTATIN 40 MG TABS (SIMVASTATIN) 1 by mouth once daily  #90 x 0   Entered by:   Romualdo Bolk, CMA (AAMA)   Authorized by:   Madelin Headings MD   Signed by:   Romualdo Bolk, CMA (AAMA) on 12/09/2009   Method used:   Electronically to        [facility]* (retail)       [address] [place], [place]  [address]       Ph: [phone]       Fax: [phone]   RxID:    [id number] OMEPRAZOLE 20 MG CPDR (OMEPRAZOLE) 1 by mouth once daily  #90 x 0   Entered by:   Romualdo Bolk, CMA (AAMA)   Authorized by:   Madelin Headings MD   Signed by:   Romualdo Bolk, CMA (AAMA) on 12/08/2009   Method used:   Electronically to        [facility]* (retail)       [address] [place], [place]  [address]       Ph: [phone]       Fax: [phone]   RxID:   [id number]

## 2010-09-30 NOTE — Progress Notes (Signed)
Summary: refills  Phone Note Call from Patient Call back at Home Phone [phone]   Caller: Patient Summary of Call: Pt called wanting a refill on crestor and nexium rx sent to [facility] for 90 days. Initial call taken by: Romualdo Bolk, CMA Duncan Dull),  September 22, 2009 4:39 PM  Follow-up for Phone Call        Rx sent to pharmacy. Follow-up by: Romualdo Bolk, CMA (AAMA),  September 22, 2009 4:40 PM    Prescriptions: CRESTOR 10 MG TABS (ROSUVASTATIN CALCIUM) Take 1 twice a week.  #72 x 0   Entered by:   Romualdo Bolk, CMA (AAMA)   Authorized by:   Madelin Headings MD   Signed by:   Romualdo Bolk, CMA (AAMA) on 09/22/2009   Method used:   Electronically to        [facility]* (retail)       [address] [place], [place]  [address]       Ph: [phone]       Fax: [phone]   RxID:   [id number] OMEPRAZOLE 20 MG CPDR (OMEPRAZOLE) 1 by mouth once daily  #90 x 0   Entered by:   Romualdo Bolk, CMA (AAMA)   Authorized by:   Madelin Headings MD   Signed by:   Romualdo Bolk, CMA (AAMA) on 09/22/2009   Method used:   Electronically to        [facility]* (retail)       [address] [place], [place]  [address]       Ph: [phone]       Fax: [phone]   RxID:   [id number]

## 2010-09-30 NOTE — Progress Notes (Signed)
  Phone Note Call from Patient Call back at [facility] Phone [phone]   Caller: Patient Summary of Call: Pt called saying that [facility] didn't get her rx for her bp medications. Initial call taken by: Romualdo Bolk, CMA Duncan Dull),  August 18, 2010 4:47 PM  Follow-up for Phone Call        rx resent. Follow-up by: Romualdo Bolk, CMA Duncan Dull),  August 18, 2010 4:47 PM    Prescriptions: TRIAMTERENE-HCTZ 37.5-25 MG TABS (TRIAMTERENE-HCTZ) Take 1 tablet by mouth once a day  #90 x 3   Entered by:   Romualdo Bolk, CMA (AAMA)   Authorized by:   Madelin Headings MD   Signed by:   Romualdo Bolk, CMA (AAMA) on 08/18/2010   Method used:   Faxed to ...       [facility]* (retail)       [address] [place], [place]  [address]       Ph: [phone]       Fax: [phone]   RxID:   [id number]

## 2010-09-30 NOTE — Progress Notes (Signed)
Summary: premedication for teeth cleaning  Phone Note Call from Patient Call back at Home Phone [phone]   Caller: Patient Summary of Call: Pt called saying that she has to be premedicated for her teeth cleaning due to her knee replacement. Pt wants rx called into [facility]. Initial call taken by: Romualdo Bolk, CMA Duncan Dull),  March 24, 2010 12:11 PM  Follow-up for Phone Call        indication  less than 2 year  knee replacement  amoxicillin  500  take  4 by mouth 30- 60 minutes pr procedure  disp  : 4  refill x 1  Follow-up by: Madelin Headings MD,  March 24, 2010 2:15 PM  Additional Follow-up for Phone Call Additional follow up Details #1::        Rx sent to pharmacy and pt aware. Additional Follow-up by: Romualdo Bolk, CMA (AAMA),  March 24, 2010 2:45 PM    New/Updated Medications: AMOXICILLIN 500 MG CAPS (AMOXICILLIN) take 4 caps by mouth 30-60 mins pre procedure Prescriptions: AMOXICILLIN 500 MG CAPS (AMOXICILLIN) take 4 caps by mouth 30-60 mins pre procedure  #4 x 1   Entered by:   Romualdo Bolk, CMA (AAMA)   Authorized by:   Madelin Headings MD   Signed by:   Romualdo Bolk, CMA (AAMA) on 03/24/2010   Method used:   Electronically to        [facility]  [address] (retail)       [address]       [place], [place]  [address]       Ph: [phone]       Fax: [phone]   RxID:   [id number]

## 2010-09-30 NOTE — Progress Notes (Signed)
Summary: refills on synthroid and bp meds  Phone Note Call from Patient Call back at [facility] Phone [phone]   Caller: Patient Summary of Call: Pt needs refills on synthroid and bp meds. Pt is now going thru [facility] rx. Initial call taken by: Romualdo Bolk, CMA (AAMA),  September 04, 2009 11:23 AM    Prescriptions: TRIAMTERENE-HCTZ 37.5-25 MG TABS (TRIAMTERENE-HCTZ) Take 1 tablet by mouth once a day  #90 x 3   Entered by:   Romualdo Bolk, CMA (AAMA)   Authorized by:   Madelin Headings MD   Signed by:   Romualdo Bolk, CMA (AAMA) on 09/04/2009   Method used:   Electronically to        [facility]* (retail)       [address] [place], [place]  [address]       Ph: [phone]       Fax: [phone]   RxID:   [id number] LEVOTHYROXINE SODIUM 75 MCG TABS (LEVOTHYROXINE SODIUM) Take 1 tablet by mouth once a day  #90 x 3   Entered by:   Romualdo Bolk, CMA (AAMA)   Authorized by:   Madelin Headings MD   Signed by:   Romualdo Bolk, CMA (AAMA) on 09/04/2009   Method used:   Electronically to        [facility]* (retail)       [address] [place], [place]  [address]       Ph: [phone]       Fax: [phone]   RxID:   [id number]

## 2010-09-30 NOTE — Progress Notes (Signed)
Summary: change from simvastatin to crestor  Phone Note Call from Patient Call back at [facility] Phone [phone]   Caller: Patient Summary of Call: Pt needs a refill on crestor and omeprazole sent thru [facility]. Pt states that simvastatin doesn't work. She states that it makes her itch. So she wants to change to crestor. Omeprazole sent into [facility]. Initial call taken by: Romualdo Bolk, CMA (AAMA),  September 07, 2010 10:51 AM  Follow-up for Phone Call        can change ot crestor 10 mg  1 by mouth once daily  disp enpugh for 90 days  checkLIPIds and lfts in 6-8 week after the change  and then follow up OV. Follow-up by: Madelin Headings MD,  September 16, 2010 9:41 PM  Additional Follow-up for Phone Call Additional follow up Details #1::        Pt aware of this. Pt to call back to schedule these labs and rov in 6-8 weeks after starting this medication. Additional Follow-up by: Romualdo Bolk, CMA (AAMA),  September 17, 2010 8:26 AM    New/Updated Medications: CRESTOR 10 MG TABS (ROSUVASTATIN CALCIUM) 1 by mouth once daily Prescriptions: CRESTOR 10 MG TABS (ROSUVASTATIN CALCIUM) 1 by mouth once daily  #90 x 0   Entered by:   Romualdo Bolk, CMA (AAMA)   Authorized by:   Madelin Headings MD   Signed by:   Romualdo Bolk, CMA (AAMA) on 09/17/2010   Method used:   Electronically to        [facility]* (retail)             ,          Ph: [phone]       Fax: [phone]   RxID:   [id number] OMEPRAZOLE 20 MG CPDR (OMEPRAZOLE) 1 by mouth once daily  #90 x 0   Entered by:   Romualdo Bolk, CMA (AAMA)   Authorized by:   Madelin Headings MD   Signed by:   Romualdo Bolk, CMA (AAMA) on 09/07/2010   Method used:   Electronically to        [facility]* (retail)             ,          Ph: [phone]       Fax: [phone]   RxID:   [id number]

## 2010-11-15 ENCOUNTER — Other Ambulatory Visit (INDEPENDENT_AMBULATORY_CARE_PROVIDER_SITE_OTHER): Payer: PRIVATE HEALTH INSURANCE | Admitting: Internal Medicine

## 2010-11-15 DIAGNOSIS — E785 Hyperlipidemia, unspecified: Secondary | ICD-10-CM

## 2010-11-15 DIAGNOSIS — T887XXA Unspecified adverse effect of drug or medicament, initial encounter: Secondary | ICD-10-CM

## 2010-11-15 LAB — LIPID PANEL
HDL: 39.5 mg/dL (ref >39.00)
Total CHOL/HDL Ratio: 3

## 2010-11-15 LAB — HEPATIC FUNCTION PANEL
ALT: 33 U/L (ref 0–35)
AST: 33 U/L (ref 0–37)
Albumin: 4.1 g/dL (ref 3.5–5.2)
Alkaline Phosphatase: 67 U/L (ref 39–117)
Bilirubin, Direct: 0.1 mg/dL (ref 0.0–0.3)
Total Bilirubin: 0.6 mg/dL (ref 0.3–1.2)
Total Protein: 6.9 g/dL (ref 6.0–8.3)

## 2010-11-18 ENCOUNTER — Encounter: Payer: Self-pay | Admitting: Internal Medicine

## 2010-11-21 LAB — CBC
HCT: 31.5 % — ABNORMAL LOW (ref 36.0–46.0)
HCT: 33.8 % — ABNORMAL LOW (ref 36.0–46.0)
Hemoglobin: 10.9 g/dL — ABNORMAL LOW (ref 12.0–15.0)
MCHC: 34.2 g/dL (ref 30.0–36.0)
MCHC: 34.5 g/dL (ref 30.0–36.0)
MCHC: 34.5 g/dL (ref 30.0–36.0)
MCV: 89.1 fL (ref 78.0–100.0)
Platelets: 228 10*3/uL (ref 150–400)
Platelets: 251 10*3/uL (ref 150–400)
RBC: 3.79 MIL/uL — ABNORMAL LOW (ref 3.87–5.11)
RBC: 4.47 MIL/uL (ref 3.87–5.11)
RDW: 14.2 % (ref 11.5–15.5)

## 2010-11-21 LAB — URINALYSIS, ROUTINE W REFLEX MICROSCOPIC
Bilirubin Urine: NEGATIVE
Bilirubin Urine: NEGATIVE
Glucose, UA: NEGATIVE mg/dL
Glucose, UA: NEGATIVE mg/dL
Hgb urine dipstick: NEGATIVE
Hgb urine dipstick: NEGATIVE
Ketones, ur: NEGATIVE mg/dL
Protein, ur: NEGATIVE mg/dL
Specific Gravity, Urine: 1.008 (ref 1.005–1.030)
pH: 6 (ref 5.0–8.0)
pH: 6.5 (ref 5.0–8.0)

## 2010-11-21 LAB — BASIC METABOLIC PANEL
BUN: 12 mg/dL (ref 6–23)
BUN: 13 mg/dL (ref 6–23)
CO2: 28 mEq/L (ref 19–32)
CO2: 31 mEq/L (ref 19–32)
Chloride: 103 mEq/L (ref 96–112)
Creatinine, Ser: 0.77 mg/dL (ref 0.4–1.2)
GFR calc non Af Amer: >60 mL/min (ref >60)
Glucose, Bld: 172 mg/dL — ABNORMAL HIGH (ref 70–99)
Potassium: 3.8 mEq/L (ref 3.5–5.1)
Potassium: 4.1 mEq/L (ref 3.5–5.1)
Sodium: 138 mEq/L (ref 135–145)

## 2010-11-21 LAB — COMPREHENSIVE METABOLIC PANEL
ALT: 42 U/L — ABNORMAL HIGH (ref 0–35)
AST: 32 U/L (ref 0–37)
Alkaline Phosphatase: 94 U/L (ref 39–117)
CO2: 30 mEq/L (ref 19–32)
Calcium: 9.4 mg/dL (ref 8.4–10.5)
GFR calc Af Amer: >60 mL/min (ref >60)
Potassium: 3.8 mEq/L (ref 3.5–5.1)
Sodium: 138 mEq/L (ref 135–145)
Total Protein: 6.9 g/dL (ref 6.0–8.3)

## 2010-11-21 LAB — DIFFERENTIAL
Eosinophils Absolute: 0.2 10*3/uL (ref 0.0–0.7)
Eosinophils Relative: 3 % (ref 0–5)
Lymphs Abs: 2.7 10*3/uL (ref 0.7–4.0)
Monocytes Relative: 6 % (ref 3–12)

## 2010-11-21 LAB — URINE CULTURE
Colony Count: NO GROWTH
Colony Count: NO GROWTH
Culture: NO GROWTH

## 2010-11-21 LAB — PROTIME-INR: Prothrombin Time: 13.8 seconds (ref 11.6–15.2)

## 2010-11-21 LAB — TYPE AND SCREEN: ABO/RH(D): O POS

## 2010-11-22 ENCOUNTER — Encounter: Payer: Self-pay | Admitting: Internal Medicine

## 2010-11-22 ENCOUNTER — Ambulatory Visit (INDEPENDENT_AMBULATORY_CARE_PROVIDER_SITE_OTHER): Payer: PRIVATE HEALTH INSURANCE | Admitting: Internal Medicine

## 2010-11-22 DIAGNOSIS — E669 Obesity, unspecified: Secondary | ICD-10-CM

## 2010-11-22 DIAGNOSIS — M25569 Pain in unspecified knee: Secondary | ICD-10-CM

## 2010-11-22 DIAGNOSIS — R7309 Other abnormal glucose: Secondary | ICD-10-CM

## 2010-11-22 DIAGNOSIS — E782 Mixed hyperlipidemia: Secondary | ICD-10-CM

## 2010-11-22 DIAGNOSIS — K219 Gastro-esophageal reflux disease without esophagitis: Secondary | ICD-10-CM

## 2010-11-22 DIAGNOSIS — I10 Essential (primary) hypertension: Secondary | ICD-10-CM

## 2010-11-22 LAB — GLUCOSE, POCT (MANUAL RESULT ENTRY): POC Glucose: 169

## 2010-11-22 MED ORDER — ROSUVASTATIN CALCIUM 20 MG PO TABS: 20.0000 mg | ORAL_TABLET | Freq: Every day | ORAL | Status: DC

## 2010-11-22 MED ORDER — TRIAMTERENE-HCTZ 37.5-25 MG PO TABS: 1.0000 | ORAL_TABLET | Freq: Every day | ORAL | Status: DC

## 2010-11-22 MED ORDER — OMEPRAZOLE 20 MG PO CPDR: 20.0000 mg | DELAYED_RELEASE_CAPSULE | Freq: Every day | ORAL | Status: DC

## 2010-11-22 MED ORDER — LEVOTHYROXINE SODIUM 75 MCG PO TABS: 75.0000 ug | ORAL_TABLET | Freq: Every day | ORAL | Status: DC

## 2010-11-22 NOTE — Assessment & Plan Note (Signed)
Weight loss may help. No   alarm features today.

## 2010-11-22 NOTE — Assessment & Plan Note (Signed)
Last hemoglobin A1c was 6.6 or below her blood sugar today was 169 a few hours after fiber breakfast bar. Discussed importance of continued lifestyle intervention agree with stopping her desserts losing weight will help.

## 2010-11-22 NOTE — Progress Notes (Signed)
  Subjective:    Patient ID: Kerry Walters, female    DOB: August 17, 1946, 65 y.o.   MRN: [id number]  HPI  patient comes in today for followup of multiple medical problems but more specifically hyperlipidemia. Her medicines have been changed because of formulary status however her new insurance Will cover Crestor which she had been on without side effect in the past. So she restarted this and comes in today for followup readings.   Hypertension:  No side effects of medicine and is not checking her readings but they have been normal.  GERD:  Continues  On Prilosec mostly in the evening has some breakthrough symptoms has been evaluated before she gets nocturnal cough. No dysphasia  Hyperglycemia: since her last visit she has stopped most desserts and is trying to cut out sugar and feels that she is reasonably successful. Past Medical History  Diagnosis Date  . Allergy   . GERD (gastroesophageal reflux disease)     hx of stricture  2005  . Hyperlipidemia   . Hypertension   . Nephrolithiasis   . Fasting hyperglycemia 2007   Past Surgical History  Procedure Date  . Appendectomy   . Tonsillectomy   . Parathryroidectomy 07/2008  . Knee surgery 11-2008  . Abdominal hysterectomy     fibroids and endometriosis total    reports that she has never smoked. She does not have any smokeless tobacco history on file. She reports that she does not drink alcohol or use illicit drugs. family history includes Cancer in her father and mother and Diabetes in her sister. Allergies  Allergen Reactions  . Codeine Phosphate     REACTION: nausea    Review of Systems  No chest pain shortness of breath weight loss her knee is stable she is doing water aerobics no bleeding change in her other status. No swelling numbness vision changes.    Objective:   Physical Exam Weight: 185 lb (83.915 kg)  Wt Readings from Last 3 Encounters:  11/22/10 185 lb (83.915 kg)  08/03/10 183 lb (83.008 kg)  02/02/10 177 lb (80.287  kg)   well-developed well-nourished in no acute distress HEENT  Grossly normal Neck supple no masses  Chest:  Clear to A&P without wheezes rales or rhonchi CV:  S1-S2 no gallops or murmurs peripheral perfusion is normal No clubbing cyanosis or edema  Labs reviewed  ldl below 100 PP bg 169         Assessment & Plan:  LIPIDs   Controlled on Crestor. Obesity   Continues. HT  controlled GERD Hyperglycemia   Continuing in the range of prediabetic or early diabetic discussed potential medication metformin when she comes back for her checkup if this is continuing.

## 2010-11-22 NOTE — Assessment & Plan Note (Signed)
His back on the Crestor because of insurance changes and no side effects. Improved numbers. Lifestyle interventions.

## 2010-11-22 NOTE — Patient Instructions (Signed)
Lifestyle interventions are reviewed intensify, lose weight etc. And we'll see her in per checkup in August labs at that visit.

## 2010-11-22 NOTE — Assessment & Plan Note (Signed)
No side effects of medicine appears to be controlled

## 2011-01-11 NOTE — Op Note (Signed)
Kerry Walters, Kerry Walters                  ACCOUNT NO.:  [id number]   MEDICAL RECORD NO.:  [id number]          PATIENT TYPE:  OIB   LOCATION:  5125                         FACILITY:  [facility]   PHYSICIAN:  Velora Heckler, MD      DATE OF BIRTH:  1946-01-11   DATE OF PROCEDURE:  08/14/2008  DATE OF DISCHARGE:                               OPERATIVE REPORT   PREOPERATIVE DIAGNOSIS:  Primary hyperparathyroidism.   POSTOPERATIVE DIAGNOSIS:  Primary hyperparathyroidism.   PROCEDURE:  Right inferior minimally invasive parathyroidectomy.   SURGEON:  Velora Heckler, MD, FACS   ASSISTANT:  Leonie Man, MD   ANESTHESIA:  General per Dr. Diamantina Monks.   ESTIMATED BLOOD LOSS:  Minimal.   PREPARATION:  Chlorhexadine   COMPLICATIONS:  None.   INDICATIONS:  The patient is a 65 year old white female from [place],  [place] referred by Dr. Berniece Andreas for primary  hyperparathyroidism.  The patient has had elevated serum calcium levels  ranging from 10.6-10.8.  Intact parathyroid hormone level was elevated  at 91.  The patient has a history of nephrolithiasis.  Sestamibi scan  was obtained and localized an area of increased uptake behind the right  lobe of the thyroid consistent with parathyroid adenoma.  The patient  now comes to surgery for exploration.   BODY OF REPORT:  Procedure was done in OR #60 at the [facility].  The patient was brought to the operating room,  placed in supine position on the operating room table.  Following  administration of general anesthesia, the patient was positioned and  then prepped and draped in the usual strict aseptic fashion.  After  ascertaining that an adequate level of anesthesia had been achieved, a  right neck incision was made with a #15 blade.  Dissection was carried  down through subcutaneous tissues and platysma.  Hemostasis was obtained  with electrocautery.  Skin flaps were elevated cephalad and caudad.  Weitlaner  retractors placed for exposure.  Strap muscles were incised in  the midline and reflected to the right.  Right thyroid lobe was exposed.  Small venous tributaries were divided between small Ligaclip.  Thyroid  lobe was rolled anteriorly.  In the tracheoesophageal groove posterior  to the right lobe of the thyroid below the level of the inferior thyroid  artery is a nodular density consistent with parathyroid adenoma.  It is  small.  It is gently dissected out.  It appears bilobar.  Both fragments  are mobilized.  Vascular tributaries were divided between small Ligaclip  and the tissue was excised.  The dominant nodule appeared to be  parathyroid tissue.  It was less than a centimeter in size.  It was  submitted fresh to Pathology for frozen section where Dr. 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   Further exploration on the right side revealed no evidence of enlarged  parathyroid tissue.  Wound was irrigated and good hemostasis was noted.  Surgicel was placed in the operative field.  Strap muscles were  reapproximated in the midline with interrupted  3-0 Vicryl sutures.  Platysma was closed with interrupted 3-0 Vicryl sutures.  Skin was  anesthetized with local anesthetic.  Skin was closed with running 4-0  Monocryl subcuticular suture.  Wound was washed and dried and Benzoin  and Steri-Strips were applied.  Sterile dressings were applied.  The  patient was awakened from anesthesia and brought to the recovery room in  stable condition.  The patient tolerated the procedure well.      Velora Heckler, MD  Electronically Signed     TMG/MEDQ  D:  08/14/2008  T:  08/14/2008  Job:  [id number]   cc:   Neta Mends. Fabian Sharp, MD  Lindaann Slough, M.D.

## 2011-02-22 ENCOUNTER — Encounter: Payer: Self-pay | Admitting: Internal Medicine

## 2011-04-08 ENCOUNTER — Encounter: Payer: Self-pay | Admitting: Internal Medicine

## 2011-04-12 ENCOUNTER — Ambulatory Visit (INDEPENDENT_AMBULATORY_CARE_PROVIDER_SITE_OTHER): Payer: Medicare Other | Admitting: Internal Medicine

## 2011-04-12 ENCOUNTER — Encounter: Payer: Self-pay | Admitting: Internal Medicine

## 2011-04-12 VITALS — BP 178/90 | HR 66 | Ht 62.25 in | Wt 182.0 lb

## 2011-04-12 DIAGNOSIS — I10 Essential (primary) hypertension: Secondary | ICD-10-CM

## 2011-04-12 DIAGNOSIS — N951 Menopausal and female climacteric states: Secondary | ICD-10-CM

## 2011-04-12 DIAGNOSIS — Z0000 Encounter for general adult medical examination without abnormal findings: Secondary | ICD-10-CM

## 2011-04-12 DIAGNOSIS — R7309 Other abnormal glucose: Secondary | ICD-10-CM

## 2011-04-12 DIAGNOSIS — E782 Mixed hyperlipidemia: Secondary | ICD-10-CM

## 2011-04-12 DIAGNOSIS — K219 Gastro-esophageal reflux disease without esophagitis: Secondary | ICD-10-CM

## 2011-04-12 DIAGNOSIS — E669 Obesity, unspecified: Secondary | ICD-10-CM

## 2011-04-12 DIAGNOSIS — E039 Hypothyroidism, unspecified: Secondary | ICD-10-CM

## 2011-04-12 LAB — CBC WITH DIFFERENTIAL/PLATELET
Basophils Absolute: 0 10*3/uL (ref 0.0–0.1)
Eosinophils Absolute: 0.3 10*3/uL (ref 0.0–0.7)
HCT: 40.3 % (ref 36.0–46.0)
Hemoglobin: 13.5 g/dL (ref 12.0–15.0)
Lymphocytes Relative: 30 % (ref 12.0–46.0)
Lymphs Abs: 2.6 10*3/uL (ref 0.7–4.0)
MCHC: 33.4 g/dL (ref 30.0–36.0)
Neutro Abs: 5.1 10*3/uL (ref 1.4–7.7)
Platelets: 261 10*3/uL (ref 150.0–400.0)
RDW: 14.2 % (ref 11.5–14.6)

## 2011-04-12 LAB — LIPID PANEL
Cholesterol: 130 mg/dL (ref 0–200)
HDL: 44.1 mg/dL (ref >39.00)
LDL Cholesterol: 66 mg/dL (ref 0–99)
Total CHOL/HDL Ratio: 3
Triglycerides: 100 mg/dL (ref 0.0–149.0)

## 2011-04-12 LAB — TSH: TSH: 2.36 u[IU]/mL (ref 0.35–5.50)

## 2011-04-12 LAB — HEPATIC FUNCTION PANEL
ALT: 32 U/L (ref 0–35)
Bilirubin, Direct: 0 mg/dL (ref 0.0–0.3)
Total Bilirubin: 0.4 mg/dL (ref 0.3–1.2)

## 2011-04-12 LAB — BASIC METABOLIC PANEL
BUN: 24 mg/dL — ABNORMAL HIGH (ref 6–23)
Calcium: 10.1 mg/dL (ref 8.4–10.5)
Chloride: 105 mEq/L (ref 96–112)
Creatinine, Ser: 1 mg/dL (ref 0.4–1.2)

## 2011-04-12 LAB — HEMOGLOBIN A1C: Hgb A1c MFr Bld: 6.8 % — ABNORMAL HIGH (ref 4.6–6.5)

## 2011-04-12 NOTE — Progress Notes (Signed)
Subjective:    Kerry Walters is a 65 y.o. female who presents for  Medicare  Wellness  And exam.  No major change in health status since last visit . Stress with sons schizophrenia and off his med but coping.. Exercises with knee limitations and eats out a good bit. No se of meds and taking  gerd med  Every other day or daily.  Cardiac risk factors: advanced age (older than 77 for men, 2 for women), dyslipidemia, hypertension and obesity (BMI >= 30 kg/m2).  Activities of Daily Living  In your present state of health, do you have any difficulty performing the following activities?:  Preparing food and eating?: No Bathing yourself: No Getting dressed: No Using the toilet:No Moving around from place to place: No In the past year have you fallen or had a near fall?:No  Current exercise habits: Gym/ health club routine includes Water aerobics.   Dietary issues discussed:  Eating out sugar limitations  Depression Screen (Note: if answer to either of the following is "Yes", then a more complete depression screening is indicated)  Q1: Over the past two weeks, have you felt down, depressed or hopeless?no Q2: Over the past two weeks, have you felt little interest or pleasure in doing things? no     Hearing: ok  Vision:  No limitations at present . Dr Dione Booze   Safety:  Has smoke detector and wears seat belts.  No firearms. No excess sun exposure. Sees dentist regularly.  Falls: no  Advance directive :  Reviewed  Has one.  Memory: Felt to be good  , no concern from her or her family.  Depression: No anhedonia unusual crying or depressive symptoms  Nutrition: Eats well balanced diet; adequate calcium and vitamin D. No swallowing chewiing problems.  Injury: no major injuries in the last six months.  Other healthcare providers:  Reviewed today .  Social:  Lives with husband married. 4  pets. Dogs   Preventive parameters: up-to-date on colonoscopy, mammogram, immunizations.  Including Tdap declined pneumovax.  ADLS:   There are no problems or need for assistance  driving, feeding, obtaining food, dressing, toileting and bathing, managing money using phone. She is independent.  ROS:  GEN/ HEENTNo fever, significant weight changes sweats headaches vision problems hearing changes, CV/ PULM; No chest pain shortness of breath cough, syncope,edema  change in exercise tolerance. GI /GU: No adominal pain, vomiting, change in bowel habits. No blood in the stool. No significant GU symptoms. SKIN/HEME: ,no acute skin rashes suspicious lesions or bleeding. No lymphadenopathy, nodules, masses.  NEURO/ PSYCH:  No neurologic signs such as weakness numbness No depression anxiety. IMM/ Allergy: No unusual infections.  Allergy .   REST of 12 system review negative   Hot flushes  At night   Past history family history social history reviewed in the electronic medical record.   Objective:     Vision done 03/21/11 normal, done by Dr. Lavona Mound Blood pressure 178/90, pulse 66, height 5' 2.25" (1.581 m), weight 182 lb (82.555 kg). Body mass index is 33.02 kg/(m^2).  Physical Exam: Vital signs reviewed NWG:NFAO is a well-developed well-nourished alert cooperative  white female who appears her stated age in no acute distress.  HEENT: normocephalic  traumatic , Eyes: PERRL EOM's full, conjunctiva clear, Nares: paten,t no deformity discharge or tenderness., Ears: no deformity EAC's clear TMs with normal landmarks. Mouth: clear OP, no lesions, edema.  Moist mucous membranes. Dentition in adequate repair. NECK: supple without masses, thyromegaly or bruits.  CHEST/PULM:  Clear to auscultation and percussion breath sounds equal no wheeze , rales or rhonchi. No chest wall deformities or tenderness. CV: PMI is nondisplaced, S1 S2 no gallops, murmurs, rubs. Peripheral pulses are full without delay.No JVD . REPEAT BLOOD PRESSURE 130/72 right arm ABDOMEN: Bowel sounds normal nontender  No guard or  rebound, no hepato splenomegal no CVA tenderness.  No hernia. Extremtities:  No clubbing cyanosis or edema, no acute joint swelling or redness no focal atrophy scar left knee no edema NEURO:  Oriented x3, cranial nerves 3-12 appear to be intact, no obvious focal weakness,gait within normal limits no abnormal reflexes or asymmetrical SKIN: No acute rashes normal turgor, color, no bruising or petechiae. Wynelle Link chandges  PSYCH: Oriented, good eye contact, no obvious depression anxiety, cognition and judgment appear normal. Oriented x 3 and no noted deficits in memory, attention, and speech. LN: no cervical axillary inguinal adenopathy Breast: normal by inspection . No dimpling, discharge, masses, tenderness or discharge . Rectal; neg masses brown stool .  Oriented x 3 and no noted deficits in memory, attention, and speech.   Assessment:  Preventive Health Care Counseled regarding healthy nutrition, exercise, sleep, injury prevention, calcium vit d and healthy weight . Hyperglycemia   Pre diabetes Hypothyroid Hyperlipidemia  No se meds HT  Stable due for labs   Up today from stress  Nl on repeat Obesity  Hot flushes   Disc weight loss as an options and other options except estrogen not that effective       Plan:  Counseled regarding healthy nutrition, exercise, sleep, injury prevention, calcium vit d and healthy weight . Get bmi below 30 could help her health risk and problems  Otherwise doing pretty  Well.  Labs pending and fu if needed   During the course of the visit the patient was educated and counseled about appropriate screening and preventive services :    Patient Instructions (the written plan) was given to the patient.   Medicare Attestation I have personally reviewed: The patient's medical and social history Their use of alcohol, tobacco or illicit drugs Their current medications and supplements The patient's functional ability including ADLs,fall risks, home safety risks,  cognitive, and hearing and visual impairment Diet and physical activities Evidence for depression or mood disorders  The patient's weight, height, BMI, and visual acuity have been recorded in the chart.  I have made referrals, counseling, and provided education to the patient based on review of the above and I have provided the patient with a written personalized care plan for preventive services.

## 2011-04-12 NOTE — Patient Instructions (Signed)
Will notify you  of labs when available.  lifestyle intervention healthy eating and exercise . Weight loss may help your hot flushes

## 2011-04-13 ENCOUNTER — Telehealth: Payer: Self-pay | Admitting: *Deleted

## 2011-04-13 DIAGNOSIS — R7303 Prediabetes: Secondary | ICD-10-CM

## 2011-04-13 NOTE — Telephone Encounter (Signed)
Pt aware of results. She isn't happy about taking another medication. She wants to think about this before starting a new medication. Pt will call back to schedule a lab appointment and a rov. Order placed in epic for lab.

## 2011-04-13 NOTE — Telephone Encounter (Signed)
Message copied by Romualdo Bolk on Wed Apr 13, 2011  2:56 PM ------      Message from: [facility] [place] [facility]      Created: Tue Apr 12, 2011  6:18 PM       Tell patient that her laboratory tests are normal except that her blood sugar is now in the early diabetic range.            I think we should start metformin 500 mg XR 1 by mouth daily with meal. This will help with her fasting blood sugar.      Continue her lifestyle intervention as we discussed.      Check hemoglobin A1c in 3 months then ROV.

## 2011-06-03 LAB — COMPREHENSIVE METABOLIC PANEL
ALT: 39 U/L — ABNORMAL HIGH (ref 0–35)
AST: 37 U/L (ref 0–37)
Albumin: 3.9 g/dL (ref 3.5–5.2)
Alkaline Phosphatase: 87 U/L (ref 39–117)
Chloride: 103 mEq/L (ref 96–112)
Potassium: 3.8 mEq/L (ref 3.5–5.1)
Sodium: 141 mEq/L (ref 135–145)
Total Protein: 6.5 g/dL (ref 6.0–8.3)

## 2011-06-03 LAB — BASIC METABOLIC PANEL
CO2: 29
Calcium: 10.3
Creatinine, Ser: 0.74
Glucose, Bld: 102 — ABNORMAL HIGH

## 2011-06-03 LAB — DIFFERENTIAL
Basophils Relative: 0 % (ref 0–1)
Eosinophils Absolute: 0.3 10*3/uL (ref 0.0–0.7)
Eosinophils Relative: 2 % (ref 0–5)
Monocytes Absolute: 0.8 10*3/uL (ref 0.1–1.0)
Monocytes Relative: 7 % (ref 3–12)

## 2011-06-03 LAB — URINALYSIS, ROUTINE W REFLEX MICROSCOPIC
Hgb urine dipstick: NEGATIVE
Nitrite: NEGATIVE
Specific Gravity, Urine: 1.006 (ref 1.005–1.030)
Urobilinogen, UA: 0.2 mg/dL (ref 0.0–1.0)

## 2011-06-03 LAB — CBC
Platelets: 302 10*3/uL (ref 150–400)
RDW: 13.3 % (ref 11.5–15.5)
WBC: 12.3 10*3/uL — ABNORMAL HIGH (ref 4.0–10.5)

## 2011-06-03 LAB — URINE MICROSCOPIC-ADD ON

## 2011-06-13 ENCOUNTER — Telehealth: Payer: Self-pay | Admitting: Internal Medicine

## 2011-06-13 NOTE — Telephone Encounter (Signed)
Please order labs for her fup in December. Thanks.

## 2011-06-13 NOTE — Telephone Encounter (Signed)
Orders place in Epic for HgA1c only. Pt doesn't have to see Dr. Fabian Sharp unless she is having a problem. Left message for pt to call back about this.

## 2011-06-14 NOTE — Telephone Encounter (Signed)
Left message with pt's spouse about this.

## 2011-08-26 ENCOUNTER — Other Ambulatory Visit (INDEPENDENT_AMBULATORY_CARE_PROVIDER_SITE_OTHER): Payer: Medicare Other

## 2011-08-26 ENCOUNTER — Ambulatory Visit: Payer: Medicare Other | Admitting: Internal Medicine

## 2011-08-26 ENCOUNTER — Other Ambulatory Visit: Payer: Self-pay | Admitting: Internal Medicine

## 2011-08-26 DIAGNOSIS — I10 Essential (primary) hypertension: Secondary | ICD-10-CM

## 2011-08-26 DIAGNOSIS — R7309 Other abnormal glucose: Secondary | ICD-10-CM

## 2011-08-26 DIAGNOSIS — R7303 Prediabetes: Secondary | ICD-10-CM

## 2011-08-26 LAB — BASIC METABOLIC PANEL
BUN: 26 mg/dL — ABNORMAL HIGH (ref 6–23)
Calcium: 10.1 mg/dL (ref 8.4–10.5)
GFR: 71.77 mL/min (ref >60.00)
Glucose, Bld: 144 mg/dL — ABNORMAL HIGH (ref 70–99)

## 2011-09-27 ENCOUNTER — Encounter: Payer: Self-pay | Admitting: Internal Medicine

## 2011-09-27 ENCOUNTER — Ambulatory Visit (INDEPENDENT_AMBULATORY_CARE_PROVIDER_SITE_OTHER): Payer: Medicare Other | Admitting: Internal Medicine

## 2011-09-27 VITALS — BP 140/80 | HR 72 | Wt 184.0 lb

## 2011-09-27 DIAGNOSIS — E782 Mixed hyperlipidemia: Secondary | ICD-10-CM

## 2011-09-27 DIAGNOSIS — R7303 Prediabetes: Secondary | ICD-10-CM

## 2011-09-27 DIAGNOSIS — R7309 Other abnormal glucose: Secondary | ICD-10-CM

## 2011-09-27 DIAGNOSIS — E039 Hypothyroidism, unspecified: Secondary | ICD-10-CM

## 2011-09-27 DIAGNOSIS — I10 Essential (primary) hypertension: Secondary | ICD-10-CM

## 2011-09-27 MED ORDER — METFORMIN HCL ER 500 MG PO TB24: 500.0000 mg | ORAL_TABLET | Freq: Every day | ORAL | Status: DC

## 2011-09-27 NOTE — Progress Notes (Signed)
  Subjective:    Patient ID: Kerry Walters, female    DOB: 04-12-1946, 66 y.o.   MRN: [id number]  HPI Patient comes in today for follow up of  multiple medical problems.   Over all she is feeling well but hasnt done much for dietary changes  Just started curves  And swimming.   But  eating all the time.  Denies cp sob infections vision changes .   BP  Has been ok.  THyroid no change GERD stable Review of Systems. Neg cp sob weight loss bleeding   Rest as per hpi Past history family history social history reviewed in the electronic medical record.      Objective:   Physical Exam Wt Readings from Last 3 Encounters:  09/27/11 184 lb (83.462 kg)  04/12/11 182 lb (82.555 kg)  11/22/10 185 lb (83.915 kg)   WDWN in nad Neck: Supple without adenopathy or masses or bruits Chest:  Clear to A&P without wheezes rales or rhonchi CV:  S1-S2 no gallops or murmurs peripheral perfusion is normal Neuro grossly normal.skin no acute changes Lab Results  Component Value Date   HGBA1C 6.8* 08/26/2011   Lab Results  Component Value Date   CHOL 130 04/12/2011   HDL 44.10 04/12/2011   LDLCALC 66 04/12/2011   TRIG 100.0 04/12/2011   CHOLHDL 3 04/12/2011          Assessment & Plan:  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 At length about the importance of lifestyle intervention healthy eating and exercise .to avoid medical complication  Of dysmetabolic  problems . Will begin metformin  And fu.  Declined nutritional referral at this time.   LIPID  Crestor has been good  wo se but insurance changes and asks for change to less costly med: unsure which one didn't work or had se  Asked to call us or fax  List from  insurance co   Obesity; no change needs to lose weight.   HT  Stable borderline systolic today . LSI will help at this point will not add med but consider adding ace if needed in future.  1/ 31 /13   Received list of  Tiered lipid meds  atorva is q limit  30 and tier 1  Will plan to change to atorvastatin. and recheck her labs at the 3 months check.

## 2011-09-27 NOTE — Patient Instructions (Signed)
Look at atorvastatin  As an alternative of  Crestor.   You need to lose weight and get your blood sugars down.  Begin metformin 500 1 po qd with am meal   Check labs in 3 months and then rov.

## 2011-09-29 NOTE — Assessment & Plan Note (Signed)
Get [place] copy of  Covered meds    atorva is  tier

## 2011-11-10 ENCOUNTER — Encounter: Payer: Self-pay | Admitting: Internal Medicine

## 2011-11-10 ENCOUNTER — Telehealth: Payer: Self-pay | Admitting: *Deleted

## 2011-11-10 ENCOUNTER — Ambulatory Visit (INDEPENDENT_AMBULATORY_CARE_PROVIDER_SITE_OTHER): Payer: Medicare Other | Admitting: Internal Medicine

## 2011-11-10 VITALS — BP 140/80 | HR 66 | Wt 181.0 lb

## 2011-11-10 DIAGNOSIS — N644 Mastodynia: Secondary | ICD-10-CM

## 2011-11-10 DIAGNOSIS — N63 Unspecified lump in unspecified breast: Secondary | ICD-10-CM

## 2011-11-10 MED ORDER — CEPHALEXIN 500 MG PO CAPS: 500.0000 mg | ORAL_CAPSULE | Freq: Four times a day (QID) | ORAL | Status: AC

## 2011-11-10 NOTE — Patient Instructions (Signed)
Unsure if this is sometime of plugged gland or early infection   Get ultrasound asap may need a mammography if radiologist suggests this to help with dx.

## 2011-11-10 NOTE — Progress Notes (Signed)
  Subjective:    Patient ID: Kerry Walters, female    DOB: 04-20-1946, 66 y.o.   MRN: [id number]  HPI Patient comes in today for SDA for  new problem evaluation. Onset of pain 2 days ago on the left breast And pain at site of area that she can fill lumpiness. She has pain no matter what when she touches when she lays all the time. It is now progressing no treatment she has never had this before there's been no discharge redness or fever..    Last mammogram less than a year. No hx of breast cysts etc some thickening of breast per mammogram.  Family hx neg for breast disease  She is not on hormones. No other change in the rest of her health   Review of Systems Negative chest pain shortness of breath nipple discharge swollen glands.  Past history family history social history reviewed in the electronic medical record.     Objective:   Physical Exam Well-developed well-nourished in no acute distress Neck: Supple without adenopathy or masses or bruits Breast: normal by inspection . No dimpling, discharge, Left nipple might be slightly flatter than the right there is tenderness and lumpiness at about 12:00 just above the nipple in about a 2-3 cm area. It is very tender to touch and there is thickening of the breast. There is no redness or skin changes. No nipple discharge. Right breast is normal axilla is clear bilaterally. Abdomen:  Sof,t normal bowel sounds without hepatosplenomegaly, no guarding rebound or masses no CVA tenderness       Assessment & Plan:  Left breast lumps and pain.   New onset   significant pain No redness like mastitis  But I suppose possible .   Patient feels like she cannot tolerate a mammogram but will do it if necessary we will send her for an immediate ultrasound. uses to direct further treatment.

## 2011-11-10 NOTE — Telephone Encounter (Signed)
Per Dr. Fabian Sharp call in Keflex 500mg  1  q 6 hours x 7 days. If gets nausea then drop back to tid. Use warm compresses. Pt aware and rx called in.

## 2011-11-17 ENCOUNTER — Encounter: Payer: Self-pay | Admitting: Internal Medicine

## 2011-11-30 ENCOUNTER — Telehealth: Payer: Self-pay | Admitting: Internal Medicine

## 2011-11-30 NOTE — Telephone Encounter (Signed)
Patient called stating she need a refill for 90 supply of synthroid sent to [facility]. Please assist.

## 2011-12-02 MED ORDER — LEVOTHYROXINE SODIUM 75 MCG PO TABS: 75.0000 ug | ORAL_TABLET | Freq: Every day | ORAL | Status: DC

## 2011-12-06 LAB — HM MAMMOGRAPHY: HM Mammogram: NEGATIVE

## 2011-12-20 ENCOUNTER — Other Ambulatory Visit (INDEPENDENT_AMBULATORY_CARE_PROVIDER_SITE_OTHER): Payer: Medicare Other

## 2011-12-20 DIAGNOSIS — R7309 Other abnormal glucose: Secondary | ICD-10-CM

## 2011-12-20 DIAGNOSIS — E785 Hyperlipidemia, unspecified: Secondary | ICD-10-CM

## 2011-12-20 LAB — HEMOGLOBIN A1C: Hgb A1c MFr Bld: 6.8 % — ABNORMAL HIGH (ref 4.6–6.5)

## 2011-12-27 ENCOUNTER — Ambulatory Visit (INDEPENDENT_AMBULATORY_CARE_PROVIDER_SITE_OTHER): Payer: Medicare Other | Admitting: Internal Medicine

## 2011-12-27 ENCOUNTER — Encounter: Payer: Self-pay | Admitting: Internal Medicine

## 2011-12-27 VITALS — BP 132/72 | HR 90 | Temp 97.9°F | Wt 179.0 lb

## 2011-12-27 DIAGNOSIS — R7309 Other abnormal glucose: Secondary | ICD-10-CM

## 2011-12-27 DIAGNOSIS — E782 Mixed hyperlipidemia: Secondary | ICD-10-CM

## 2011-12-27 DIAGNOSIS — I10 Essential (primary) hypertension: Secondary | ICD-10-CM

## 2011-12-27 DIAGNOSIS — E669 Obesity, unspecified: Secondary | ICD-10-CM

## 2011-12-27 DIAGNOSIS — R7303 Prediabetes: Secondary | ICD-10-CM

## 2011-12-27 DIAGNOSIS — M545 Low back pain, unspecified: Secondary | ICD-10-CM

## 2011-12-27 MED ORDER — LEVOTHYROXINE SODIUM 75 MCG PO TABS: 75.0000 ug | ORAL_TABLET | Freq: Every day | ORAL | Status: DC

## 2011-12-27 MED ORDER — ATORVASTATIN CALCIUM 20 MG PO TABS: 20.0000 mg | ORAL_TABLET | Freq: Every day | ORAL | Status: DC

## 2011-12-27 MED ORDER — OMEPRAZOLE 20 MG PO CPDR: 20.0000 mg | DELAYED_RELEASE_CAPSULE | Freq: Every day | ORAL | Status: DC

## 2011-12-27 MED ORDER — METFORMIN HCL ER 500 MG PO TB24: 1000.0000 mg | ORAL_TABLET | Freq: Every day | ORAL | Status: DC

## 2011-12-27 MED ORDER — TRIAMTERENE-HCTZ 37.5-25 MG PO TABS: 1.0000 | ORAL_TABLET | Freq: Every day | ORAL | Status: DC

## 2011-12-27 MED ORDER — LIDOCAINE 5 % EX PTCH: 1.0000 | MEDICATED_PATCH | CUTANEOUS | Status: DC

## 2011-12-27 NOTE — Patient Instructions (Signed)
Continue your lifestyle changes and losing weight.  [facility].  We will increase the metformin to 2 pills a day or 1000 mg.  We'll send a prescription for Lidoderm patch to your local pharmacy but it may not be approved for your insurance. I agree that it is safer and probably more effective review for your back pain.   We should  plan on rechecking full set of labs at a wellness visit in August or September. We can do laboratory studies at that visit

## 2011-12-27 NOTE — Progress Notes (Signed)
  Subjective:    Patient ID: Kerry Walters, female    DOB: 29-Dec-1945, 66 y.o.   MRN: [id number]  HPI Patient comes in today for follow up of  multiple medical problems.   Change in diet  And less sugar. No tea and soft drinks Had cereal fiber this am and  Milk  No change reflux   Clear throat.  Stable LIPIDS never had the med change but formulary covers lipitor  Has intermittent back pain has seen dt stern in remote past  Non surgical problem  Used someone's lidoderm patch and it helped a lot when flared asks for rx.  Review of Systems Neg cp  sob  numbness vision change infection or falling  Past history family history social history reviewed in the electronic medical record. Outpatient Prescriptions Prior to Visit  Medication Sig Dispense Refill  . levothyroxine (SYNTHROID, LEVOTHROID) 75 MCG tablet Take 1 tablet (75 mcg total) by mouth daily.  90 tablet  0  . metFORMIN (GLUCOPHAGE-XR) 500 MG 24 hr tablet Take 1 tablet (500 mg total) by mouth daily with breakfast.  30 tablet  6  . omeprazole (PRILOSEC) 20 MG capsule Take 1 capsule (20 mg total) by mouth daily.  90 capsule  3  . rosuvastatin (CRESTOR) 20 MG tablet Take 1 tablet (20 mg total) by mouth daily.  90 tablet  3  . triamterene-hydrochlorothiazide (MAXZIDE-25) 37.5-25 MG per tablet Take 1 tablet by mouth daily.  90 tablet  3       Objective:   Physical Exam BP 132/72  Pulse 90  Temp(Src) 97.9 F (36.6 C) (Oral)  Wt 179 lb (81.194 kg)  SpO2 98% Wt Readings from Last 3 Encounters:  12/27/11 179 lb (81.194 kg)  11/10/11 181 lb (82.101 kg)  09/27/11 184 lb (83.462 kg)   WDWN in nad looks well  Back no bony tendernes  Walks well  Lab Results  Component Value Date   HGBA1C 6.8* 12/20/2011   Lab Results  Component Value Date   CHOL 129 12/20/2011   HDL 41.70 12/20/2011   LDLCALC 54 12/20/2011   TRIG 169.0* 12/20/2011   CHOLHDL 3 12/20/2011  see bg over 200      Assessment & Plan:  Hyperglycemia the sam a1c but 1-2  hours pp elevated bg 200  Meets criteria probably of dm not interested in checking her own BGs  Intensify lifestyle interventions. Increase metformin  LIPID change to  lipitor  And plan fu at wellness visit in 3-4 months.  Back pain poss djd  Other   rx lidoderm patch to limit nsaid use  As helps  GERDno change   Obesity weight loss to continue   HT refill meds today  Insurance change .   Total visit > 50% spent counseling and coordinating care

## 2012-01-02 ENCOUNTER — Telehealth: Payer: Self-pay | Admitting: Family Medicine

## 2012-01-02 MED ORDER — LEVOTHYROXINE SODIUM 75 MCG PO TABS: 75.0000 ug | ORAL_TABLET | Freq: Every day | ORAL | Status: DC

## 2012-01-02 NOTE — Telephone Encounter (Signed)
Ok per Dr. Fabian Sharp to give pt #30.  Faxed pharmacy.

## 2012-01-02 NOTE — Telephone Encounter (Signed)
[facility] has questions about the QTY on the Lidoderm patch. Comes in box of 30, and they can't break up the box. Please call Dawn at  pharmacy. Thanks!

## 2012-03-02 ENCOUNTER — Encounter: Payer: Self-pay | Admitting: Internal Medicine

## 2012-04-27 ENCOUNTER — Encounter: Payer: Self-pay | Admitting: Internal Medicine

## 2012-04-27 ENCOUNTER — Ambulatory Visit (INDEPENDENT_AMBULATORY_CARE_PROVIDER_SITE_OTHER): Payer: Medicare Other | Admitting: Internal Medicine

## 2012-04-27 VITALS — BP 156/84 | HR 91 | Temp 98.0°F | Ht 62.25 in | Wt 172.0 lb

## 2012-04-27 DIAGNOSIS — E782 Mixed hyperlipidemia: Secondary | ICD-10-CM

## 2012-04-27 DIAGNOSIS — Z23 Encounter for immunization: Secondary | ICD-10-CM

## 2012-04-27 DIAGNOSIS — E039 Hypothyroidism, unspecified: Secondary | ICD-10-CM

## 2012-04-27 DIAGNOSIS — Z0000 Encounter for general adult medical examination without abnormal findings: Secondary | ICD-10-CM | POA: Insufficient documentation

## 2012-04-27 DIAGNOSIS — R7303 Prediabetes: Secondary | ICD-10-CM

## 2012-04-27 DIAGNOSIS — R7309 Other abnormal glucose: Secondary | ICD-10-CM

## 2012-04-27 DIAGNOSIS — N951 Menopausal and female climacteric states: Secondary | ICD-10-CM

## 2012-04-27 DIAGNOSIS — I10 Essential (primary) hypertension: Secondary | ICD-10-CM

## 2012-04-27 DIAGNOSIS — K219 Gastro-esophageal reflux disease without esophagitis: Secondary | ICD-10-CM

## 2012-04-27 DIAGNOSIS — Z79899 Other long term (current) drug therapy: Secondary | ICD-10-CM

## 2012-04-27 LAB — LIPID PANEL
Cholesterol: 134 mg/dL (ref 0–200)
LDL Cholesterol: 70 mg/dL (ref 0–99)
Total CHOL/HDL Ratio: 3

## 2012-04-27 LAB — CBC WITH DIFFERENTIAL/PLATELET
Eosinophils Relative: 2.9 % (ref 0.0–5.0)
HCT: 41.6 % (ref 36.0–46.0)
Hemoglobin: 13.6 g/dL (ref 12.0–15.0)
Lymphocytes Relative: 28.8 % (ref 12.0–46.0)
Lymphs Abs: 2.4 10*3/uL (ref 0.7–4.0)
Monocytes Relative: 6.9 % (ref 3.0–12.0)
Platelets: 252 10*3/uL (ref 150.0–400.0)
WBC: 8.2 10*3/uL (ref 4.5–10.5)

## 2012-04-27 LAB — BASIC METABOLIC PANEL
BUN: 23 mg/dL (ref 6–23)
GFR: 73.64 mL/min (ref >60.00)
Potassium: 4.1 mEq/L (ref 3.5–5.1)
Sodium: 142 mEq/L (ref 135–145)

## 2012-04-27 LAB — TSH: TSH: 3.53 u[IU]/mL (ref 0.35–5.50)

## 2012-04-27 LAB — HEPATIC FUNCTION PANEL
ALT: 26 U/L (ref 0–35)
AST: 23 U/L (ref 0–37)
Alkaline Phosphatase: 76 U/L (ref 39–117)
Total Bilirubin: 0.3 mg/dL (ref 0.3–1.2)

## 2012-04-27 LAB — HEMOGLOBIN A1C: Hgb A1c MFr Bld: 6.3 % (ref 4.6–6.5)

## 2012-04-27 NOTE — Progress Notes (Signed)
Subjective:    Patient ID: Kerry Walters, female    DOB: Mar 20, 1946, 66 y.o.   MRN: [id number]  HPI Patient comes in today for Preventive Health Care visit and medical management. Since her last visit she is losing weight lifestyle intervention.  Blood pressure hasn't been checking her readings taking medication. Medication omeprazole is controlling her reflux symptoms. She is taking her thyroid medication religiously without missed doses. BG is taking metformin each day without symptoms. Lipids:   Has switched from Crestor to Lipitor. For cost reasons Has questions because of lawyer's on the TV talking about diabetes and Lipitor.   Hearing:  Good check right ear almost a buzz. No significant hearing loss  Vision:  No limitations at present .  Safety:  Has smoke detector and wears seat belts.  No firearms. No excess sun exposure. Sees dentist regularly.  Falls:  No falls   Advance directive :  Reviewed    Memory: Felt to be good  , no concern from her or her family.  Depression: No anhedonia unusual crying or depressive symptoms  Nutrition: Eats well balanced diet; adequate calcium and vitamin D. No swallowing chewiing problems.  Injury: no major injuries in the last six months.  Other healthcare providers:  Reviewed today .  Social:  Lives with husband married. Dog  pets.   Preventive parameters: up-to-date on colonoscopy, mammogram,  ADLS:   There are no problems or need for assistance  driving, feeding, obtaining food, dressing, toileting and bathing, managing money using phone. She is independent.  Exercise : Water aerobics at least 2 times a week. Also curves  Neg tad   Review of Systems ROS:  GEN/ HEENT: No fever, significant weight changes sweats headaches vision problems  changes, CV/ PULM; No chest pain shortness of breath cough, syncope,edema  change in exercise tolerance. GI /GU: No adominal pain, vomiting, change in bowel habits. No blood in the stool. No  significant GU symptoms. SKIN/HEME: ,no acute skin rashes suspicious lesions or bleeding. No lymphadenopathy, nodules, masses.  NEURO/ PSYCH:  No neurologic signs such as weakness numbness. No depression anxiety. IMM/ Allergy: No unusual infections.  Allergy .   REST of 12 system review negative except as per HPI or above   Outpatient Encounter Prescriptions as of 04/27/2012  Medication Sig Dispense Refill  . atorvastatin (LIPITOR) 20 MG tablet Take 1 tablet (20 mg total) by mouth daily.  90 tablet  3  . levothyroxine (SYNTHROID, LEVOTHROID) 75 MCG tablet Take 1 tablet (75 mcg total) by mouth daily.  90 tablet  3  . metFORMIN (GLUCOPHAGE-XR) 500 MG 24 hr tablet Take 2 tablets (1,000 mg total) by mouth daily with breakfast.  180 tablet  3  . omeprazole (PRILOSEC) 20 MG capsule Take 1 capsule (20 mg total) by mouth daily.  90 capsule  3  . triamterene-hydrochlorothiazide (MAXZIDE-25) 37.5-25 MG per tablet Take 1 each (1 tablet total) by mouth daily.  90 tablet  3      Objective:   Physical Exam BP 156/84  Pulse 91  Temp 98 F (36.7 C) (Oral)  Ht 5' 2.25" (1.581 m)  Wt 172 lb (78.019 kg)  BMI 31.21 kg/m2  SpO2 96% Wt Readings from Last 3 Encounters:  04/27/12 172 lb (78.019 kg)  12/27/11 179 lb (81.194 kg)  11/10/11 181 lb (82.101 kg)   Physical Exam: Vital signs reviewed NWG:NFAO is a well-developed well-nourished alert cooperative  white female who appears her stated age in no acute distress.  HEENT: normocephalic atraumatic , Eyes: PERRL EOM's full, conjunctiva clear, Nares: paten,t no deformity discharge or tenderness., Ears: no deformity EAC's clear TMs with normal landmarks.? If right retracted  Mouth: clear OP, no lesions, edema.  Moist mucous membranes. Dentition in adequate repair. NECK: supple without masses, thyromegaly or bruits. CHEST/PULM:  Clear to auscultation and percussion breath sounds equal no wheeze , rales or rhonchi. No chest wall deformities or  tenderness. Breast: normal by inspection . No dimpling, discharge, masses, tenderness or discharge . CV: PMI is nondisplaced, S1 S2 no gallops, murmurs, rubs. Peripheral pulses are full without delay.No JVD .  ABDOMEN: Bowel sounds normal nontender  No guard or rebound, no hepato splenomegal no CVA tenderness.  No hernia. Extremtities:  No clubbing cyanosis or edema, no acute joint swelling or redness no focal atrophy NEURO:  Oriented x3, cranial nerves 3-12 appear to be intact, no obvious focal weakness,gait within normal limits no abnormal reflexes or asymmetrical sensation intact on feet  Monofilament  SKIN: No acute rashes normal turgor, color, no bruising or petechiae. PSYCH: Oriented, good eye contact, no obvious depression anxiety, cognition and judgment appear normal. LN: no cervical axillary inguinal adenopathy  Oriented x 3 and no noted deficits in memory, attention, and speech.     Assessment & Plan:   Preventive Health Care Counseled regarding healthy nutrition, exercise, sleep, injury prevention, calcium vit d and healthy weight . Continue weight loss .   zostavax today  LIPIDS   Switched  to generic Lipitor reviewed what I can in the electronic record she has had high ratios in the past  her last LDL was quite low goal would be below 100 checked today. Discussed signal on the [place] trial in review of past studies about the elevated blood sugar phenomenon and slight increased risk of diabetes. At this time would not change medication  Although  may be able to adjust dosing down  Prediabetes    Continue weight loss     Check lab today  HT goal discussed consider change to prinzide type med if  Elevated at home  Repeat readings today are in the 140s  Low  Need fu about this or if elevated get a new monitor  THYROid check today  Ear looks ok but could have eustachian tube dysfunction   The patient will observe these symptoms, and report  any worsening or  Progression persistence

## 2012-04-27 NOTE — Patient Instructions (Signed)
Will notify you  of labs when available.  Continue healthy lifestyle weight loss exercise.  Because her blood pressure readings are elevated in the office please get another blood pressure monitor that you can use easily and get readings a couple times a week and if normal then once a month is fine. Bring the monitor yourself to your next office visit and we can check it. The goal would be to get your blood pressure below 140/90. Both numbers are important. If we need to augment her medicine we can change to an inexpensive combination medicine diuretic and ACE inhibitor.   No change in medication at this time if your labs are appropriate. If your lipids are good we may be able to decrease the milligrams.  ldl goal is below 100

## 2012-05-02 ENCOUNTER — Other Ambulatory Visit: Payer: Self-pay | Admitting: Family Medicine

## 2012-05-02 DIAGNOSIS — R7303 Prediabetes: Secondary | ICD-10-CM

## 2012-05-02 DIAGNOSIS — R7301 Impaired fasting glucose: Secondary | ICD-10-CM

## 2012-10-22 ENCOUNTER — Other Ambulatory Visit (INDEPENDENT_AMBULATORY_CARE_PROVIDER_SITE_OTHER): Payer: Medicare Other

## 2012-10-22 DIAGNOSIS — R7309 Other abnormal glucose: Secondary | ICD-10-CM

## 2012-10-22 LAB — HEMOGLOBIN A1C: Hgb A1c MFr Bld: 6.8 % — ABNORMAL HIGH (ref 4.6–6.5)

## 2012-10-30 ENCOUNTER — Ambulatory Visit: Payer: Medicare Other | Admitting: Internal Medicine

## 2012-10-31 ENCOUNTER — Other Ambulatory Visit: Payer: Self-pay | Admitting: Internal Medicine

## 2012-10-31 ENCOUNTER — Ambulatory Visit (INDEPENDENT_AMBULATORY_CARE_PROVIDER_SITE_OTHER): Payer: Medicare Other | Admitting: Internal Medicine

## 2012-10-31 ENCOUNTER — Encounter: Payer: Self-pay | Admitting: Internal Medicine

## 2012-10-31 VITALS — BP 130/80 | HR 100 | Temp 98.6°F | Wt 183.0 lb

## 2012-10-31 DIAGNOSIS — I10 Essential (primary) hypertension: Secondary | ICD-10-CM

## 2012-10-31 DIAGNOSIS — R7303 Prediabetes: Secondary | ICD-10-CM

## 2012-10-31 DIAGNOSIS — K219 Gastro-esophageal reflux disease without esophagitis: Secondary | ICD-10-CM

## 2012-10-31 DIAGNOSIS — G4762 Sleep related leg cramps: Secondary | ICD-10-CM | POA: Insufficient documentation

## 2012-10-31 DIAGNOSIS — E039 Hypothyroidism, unspecified: Secondary | ICD-10-CM

## 2012-10-31 DIAGNOSIS — R7309 Other abnormal glucose: Secondary | ICD-10-CM

## 2012-10-31 DIAGNOSIS — E782 Mixed hyperlipidemia: Secondary | ICD-10-CM

## 2012-10-31 MED ORDER — TRIAMTERENE-HCTZ 37.5-25 MG PO TABS: 1.0000 | ORAL_TABLET | Freq: Every day | ORAL | Status: DC

## 2012-10-31 MED ORDER — ATORVASTATIN CALCIUM 20 MG PO TABS: 20.0000 mg | ORAL_TABLET | Freq: Every day | ORAL | Status: DC

## 2012-10-31 MED ORDER — METFORMIN HCL ER 500 MG PO TB24: 1000.0000 mg | ORAL_TABLET | Freq: Every day | ORAL | Status: DC

## 2012-10-31 MED ORDER — PANTOPRAZOLE SODIUM 40 MG PO TBEC: 40.0000 mg | DELAYED_RELEASE_TABLET | Freq: Every day | ORAL | Status: DC

## 2012-10-31 MED ORDER — LEVOTHYROXINE SODIUM 75 MCG PO TABS: 75.0000 ug | ORAL_TABLET | Freq: Every day | ORAL | Status: DC

## 2012-10-31 NOTE — Patient Instructions (Addendum)
Weight loss  Can help the reflux .   Increase potassium and mag in foods .    change omeprazole to  Generic protonix .    Recheck you blood pressure    Call about cramps if ongoing and we can check potassium and magnesium  Labs . Wellness visit in 6 months  Will do labs at that visit.

## 2012-10-31 NOTE — Progress Notes (Signed)
Chief Complaint  Patient presents with  . Follow-up    HPI: Patient comes in today for follow up of  multiple medical problems.  bp 135 75 to 80  Ok at home     Needs refill med    Take diuretic in middle of day  relux    Increase since weight gain.   Throat clearing upper airway sx .  Taking omeprazole insurance didn't pay for nexium in the past. No cough sob. Thyroid no change  Lipids  No  se of med does like grapefruit a few times a week   ROS: See pertinent positives and negatives per HPI. Sleep an issue still  Trying otc supplement no bleeding falling neuro sx   Rest negative on   Past Medical History  Diagnosis Date  . Allergy   . GERD (gastroesophageal reflux disease)     hx of stricture  2005  . Hyperlipidemia   . Hypertension   . Nephrolithiasis   . Fasting hyperglycemia 2007    Family History  Problem Relation Age of Onset  . Cancer Son     liver  . Cancer Father     brain  . Diabetes Sister   . Schizophrenia Son     History   Social History  . Marital Status: Married    Spouse Name: N/A    Number of Children: N/A  . Years of Education: N/A   Social History Main Topics  . Smoking status: Never Smoker   . Smokeless tobacco: None  . Alcohol Use: No  . Drug Use: No  . Sexually Active: None   Other Topics Concern  . None   Social History Narrative   Married   hh of 2   4 dogs   G2 P2   Water exercise.          Outpatient Encounter Prescriptions as of 10/31/2012  Medication Sig Dispense Refill  . atorvastatin (LIPITOR) 20 MG tablet Take 1 tablet (20 mg total) by mouth daily.  90 tablet  3  . levothyroxine (SYNTHROID, LEVOTHROID) 75 MCG tablet Take 1 tablet (75 mcg total) by mouth daily.  90 tablet  3  . metFORMIN (GLUCOPHAGE-XR) 500 MG 24 hr tablet Take 2 tablets (1,000 mg total) by mouth daily with breakfast.  180 tablet  3  . omeprazole (PRILOSEC) 20 MG capsule Take 1 capsule (20 mg total) by mouth daily.  90 capsule  3  .  triamterene-hydrochlorothiazide (MAXZIDE-25) 37.5-25 MG per tablet Take 1 each (1 tablet total) by mouth daily.  90 tablet  3  . [DISCONTINUED] atorvastatin (LIPITOR) 20 MG tablet Take 1 tablet (20 mg total) by mouth daily.  90 tablet  3  . [DISCONTINUED] levothyroxine (SYNTHROID, LEVOTHROID) 75 MCG tablet Take 1 tablet (75 mcg total) by mouth daily.  90 tablet  3  . [DISCONTINUED] metFORMIN (GLUCOPHAGE-XR) 500 MG 24 hr tablet Take 2 tablets (1,000 mg total) by mouth daily with breakfast.  180 tablet  3  . [DISCONTINUED] triamterene-hydrochlorothiazide (MAXZIDE-25) 37.5-25 MG per tablet Take 1 each (1 tablet total) by mouth daily.  90 tablet  3  . pantoprazole (PROTONIX) 40 MG tablet Take 1 tablet (40 mg total) by mouth daily.  90 tablet  3   No facility-administered encounter medications on file as of 10/31/2012.    EXAM:  BP 130/80  Pulse 100  Temp(Src) 98.6 F (37 C) (Oral)  Wt 183 lb (83.008 kg)  BMI 33.21 kg/m2  SpO2 98%  Body  mass index is 33.21 kg/(m^2).  Wt Readings from Last 3 Encounters:  10/31/12 183 lb (83.008 kg)  04/27/12 172 lb (78.019 kg)  12/27/11 179 lb (81.194 kg)    GENERAL: vitals reviewed and listed above, alert, oriented, appears well hydrated and in no acute distress  HEENT: atraumatic, conjunctiva  clear, no obvious abnormalities on inspection of external nose and ears OP : no lesion edema or exudate   NECK: no obvious masses on inspection palpation   LUNGS: clear to auscultation bilaterally, no wheezes, rales or rhonchi, good air movement  CV: HRRR, no clubbing cyanosis or  peripheral edema nl cap refill   MS: moves all extremities without noticeable focal  abnormality  PSYCH: pleasant and cooperative, no obvious depression or anxiety Lab Results  Component Value Date   WBC 8.2 04/27/2012   HGB 13.6 04/27/2012   HCT 41.6 04/27/2012   PLT 252.0 04/27/2012   GLUCOSE 122* 04/27/2012   CHOL 134 04/27/2012   TRIG 102.0 04/27/2012   HDL 43.80 04/27/2012    LDLCALC 70 04/27/2012   ALT 26 04/27/2012   AST 23 04/27/2012   NA 142 04/27/2012   K 4.1 04/27/2012   CL 104 04/27/2012   CREATININE 0.8 04/27/2012   BUN 23 04/27/2012   CO2 30 04/27/2012   TSH 3.53 04/27/2012   INR 1.12 11/19/2009   HGBA1C 6.8* 10/22/2012    ASSESSMENT AND PLAN:  Discussed the following assessment and plan:  Pre-diabetes - stable  could be better   HYPERTENSION - up today ;better on recheck  uncertain  if she was ever on an acei or arb.  will review at next visit   HYPOTHYROIDISM  HYPERLIPIDEMIA - refill atorva  disc grapefruit  GERD - break through sx ELR?  try generic protonix   Nocturnal leg cramps - ocass  uses mustard ant this helps .  if needed can get repeat K  with bmp and Mg level . no visit needed  unless wishes  11 pound weight gain since last visit  Disc with Pt Metabolic effects of weight loss and gain  -Patient advised to return or notify health care team  if symptoms worsen or persist or new concerns arise.  Patient Instructions  Weight loss  Can help the reflux .   Increase potassium and mag in foods .    change omeprazole to  Generic protonix .    Recheck you blood pressure    Call about cramps if ongoing and we can check potassium and magnesium  Labs . Wellness visit in 6 months  Will do labs at that visit.      Neta Mends. Panosh M.D.

## 2012-11-13 ENCOUNTER — Telehealth: Payer: Self-pay | Admitting: Internal Medicine

## 2012-11-13 MED ORDER — TRIAMTERENE-HCTZ 37.5-25 MG PO TABS: 1.0000 | ORAL_TABLET | Freq: Every day | ORAL | Status: DC

## 2012-11-13 MED ORDER — LEVOTHYROXINE SODIUM 75 MCG PO TABS: 75.0000 ug | ORAL_TABLET | Freq: Every day | ORAL | Status: DC

## 2012-11-13 NOTE — Telephone Encounter (Signed)
Resent to [facility] by e-scribe.

## 2012-11-13 NOTE — Telephone Encounter (Signed)
[facility] said [facility] never received rxs for levothyroxine 75 mcg #90 and triamterene-hctz 37.5-25mg  #90 with 3 refills.

## 2013-02-12 ENCOUNTER — Other Ambulatory Visit: Payer: Self-pay | Admitting: Internal Medicine

## 2013-02-25 ENCOUNTER — Encounter: Payer: Self-pay | Admitting: Internal Medicine

## 2013-03-06 ENCOUNTER — Ambulatory Visit (INDEPENDENT_AMBULATORY_CARE_PROVIDER_SITE_OTHER): Payer: Medicare Other | Admitting: Internal Medicine

## 2013-03-06 ENCOUNTER — Ambulatory Visit: Payer: Medicare Other | Admitting: Family Medicine

## 2013-03-06 ENCOUNTER — Encounter: Payer: Self-pay | Admitting: Internal Medicine

## 2013-03-06 VITALS — BP 128/74 | HR 89 | Temp 98.4°F | Wt 179.0 lb

## 2013-03-06 DIAGNOSIS — N390 Urinary tract infection, site not specified: Secondary | ICD-10-CM

## 2013-03-06 DIAGNOSIS — R300 Dysuria: Secondary | ICD-10-CM

## 2013-03-06 DIAGNOSIS — I10 Essential (primary) hypertension: Secondary | ICD-10-CM

## 2013-03-06 DIAGNOSIS — Z87442 Personal history of urinary calculi: Secondary | ICD-10-CM

## 2013-03-06 DIAGNOSIS — R350 Frequency of micturition: Secondary | ICD-10-CM

## 2013-03-06 LAB — POCT URINALYSIS DIPSTICK
Blood, UA: 2
Ketones, UA: NEGATIVE
Protein, UA: 2
Spec Grav, UA: 1.015
Urobilinogen, UA: 0.2
pH, UA: 7

## 2013-03-06 MED ORDER — SULFAMETHOXAZOLE-TRIMETHOPRIM 800-160 MG PO TABS: 1.0000 | ORAL_TABLET | Freq: Two times a day (BID) | ORAL | Status: DC

## 2013-03-06 NOTE — Patient Instructions (Signed)
i think this is a uti  Take antibiotic 3-5 days  Will contact you about culture results when available.    Contact us if  persistent or progressive

## 2013-03-06 NOTE — Addendum Note (Signed)
Addended by: Rita Ohara R on: 03/06/2013 03:37 PM   Modules accepted: Orders

## 2013-03-06 NOTE — Progress Notes (Signed)
Chief Complaint  Patient presents with  . Dysuria    Started one week ago.  Has been taking OTC medication.  . Urinary Frequency  . Flank Pain  . Back Pain    HPI: Patient comes in today for SDA for  new problem evaluation. Above sx .   Using otc no help.  No gross hematuria feels uncomfortable but not terribly ill No fever  no vomiting diarrhea does get yeast infections with antibiotics. ROS: See pertinent positives and negatives per HPI. Hx of renal stones and removal.  Last dose pcn teeth cleaning   April no other recent antibiotic  Has questions about Lyme disease that she's on T.V she doesn't have these sx  Past Medical History  Diagnosis Date  . Allergy   . GERD (gastroesophageal reflux disease)     hx of stricture  2005  . Hyperlipidemia   . Hypertension   . Nephrolithiasis   . Fasting hyperglycemia 2007    Family History  Problem Relation Age of Onset  . Cancer Son     liver  . Cancer Father     brain  . Diabetes Sister   . Schizophrenia Son     History   Social History  . Marital Status: Married    Spouse Name: N/A    Number of Children: N/A  . Years of Education: N/A   Social History Main Topics  . Smoking status: Never Smoker   . Smokeless tobacco: None  . Alcohol Use: No  . Drug Use: No  . Sexually Active: None   Other Topics Concern  . None   Social History Narrative   Married   hh of 2   4 dogs   G2 P2   Water exercise.          Outpatient Encounter Prescriptions as of 03/06/2013  Medication Sig Dispense Refill  . atorvastatin (LIPITOR) 20 MG tablet Take 1 tablet (20 mg total) by mouth daily.  90 tablet  3  . levothyroxine (SYNTHROID, LEVOTHROID) 75 MCG tablet TAKE 1 TABLET DAILY  90 tablet  0  . metFORMIN (GLUCOPHAGE-XR) 500 MG 24 hr tablet Take 2 tablets (1,000 mg total) by mouth daily with breakfast.  180 tablet  3  . pantoprazole (PROTONIX) 40 MG tablet Take 1 tablet (40 mg total) by mouth daily.  90 tablet  3  .  triamterene-hydrochlorothiazide (MAXZIDE-25) 37.5-25 MG per tablet Take 1 each (1 tablet total) by mouth daily.  90 tablet  3  . sulfamethoxazole-trimethoprim (SEPTRA DS) 800-160 MG per tablet Take 1 tablet by mouth 2 (two) times daily. For uti  10 tablet  0   No facility-administered encounter medications on file as of 03/06/2013.    EXAM:  BP 128/74  Pulse 89  Temp(Src) 98.4 F (36.9 C) (Oral)  Wt 179 lb (81.194 kg)  BMI 32.48 kg/m2  SpO2 98%  Body mass index is 32.48 kg/(m^2).  GENERAL: vitals reviewed and listed above, alert, oriented, appears well hydrated and in no acute distress  HEENT: atraumatic, conjunctiva  clear, no obvious abnormalities on inspection of external nose and ears  NECK: no obvious masses on inspection palpation  CV: HRRR, no clubbing cyanosis or  peripheral edema nl cap refill  Abdomen:  Sof,t normal bowel sounds without hepatosplenomegaly, no guarding rebound or masses no CVA tenderness points to rlq are of pain at times that come and go and RLback  MS: moves all extremities without noticeable acute focal  abnormality PSYCH: pleasant  and cooperative, no obvious depression or anxiety ua 2+ bl 3+ leuk   ASSESSMENT AND PLAN:  Discussed the following assessment and plan:  Dysuria - Plan: POC Urinalysis Dipstick, Urine culture  Urinary frequency - Plan: POC Urinalysis Dipstick, Urine culture  UTI (urinary tract infection)  NEPHROLITHIASIS, HX OF Begin Septra 3-5 days pain doesn't really seem like renal stones .  Culture pending treatment as needed for followup make a yeast infection and treat. Answer questions as best possible. -Patient advised to return or notify health care team  if symptoms worsen or persist or new concerns arise.  Patient Instructions  i think this is a uti  Take antibiotic 3-5 days  Will contact you about culture results when available.    Contact us if  persistent or progressive      Burna Mortimer K.  M.D.

## 2013-03-08 LAB — URINE CULTURE

## 2013-04-03 ENCOUNTER — Other Ambulatory Visit: Payer: Self-pay

## 2013-05-03 ENCOUNTER — Encounter: Payer: Self-pay | Admitting: Internal Medicine

## 2013-05-03 ENCOUNTER — Ambulatory Visit (INDEPENDENT_AMBULATORY_CARE_PROVIDER_SITE_OTHER): Payer: Medicare Other | Admitting: Internal Medicine

## 2013-05-03 VITALS — BP 138/84 | HR 75 | Temp 97.5°F | Wt 177.0 lb

## 2013-05-03 DIAGNOSIS — Z0110 Encounter for examination of ears and hearing without abnormal findings: Secondary | ICD-10-CM

## 2013-05-03 DIAGNOSIS — Z0000 Encounter for general adult medical examination without abnormal findings: Secondary | ICD-10-CM

## 2013-05-03 DIAGNOSIS — E782 Mixed hyperlipidemia: Secondary | ICD-10-CM

## 2013-05-03 DIAGNOSIS — R6889 Other general symptoms and signs: Secondary | ICD-10-CM

## 2013-05-03 DIAGNOSIS — I10 Essential (primary) hypertension: Secondary | ICD-10-CM

## 2013-05-03 DIAGNOSIS — R7309 Other abnormal glucose: Secondary | ICD-10-CM

## 2013-05-03 DIAGNOSIS — K219 Gastro-esophageal reflux disease without esophagitis: Secondary | ICD-10-CM

## 2013-05-03 DIAGNOSIS — R7303 Prediabetes: Secondary | ICD-10-CM

## 2013-05-03 DIAGNOSIS — E669 Obesity, unspecified: Secondary | ICD-10-CM

## 2013-05-03 DIAGNOSIS — E039 Hypothyroidism, unspecified: Secondary | ICD-10-CM

## 2013-05-03 LAB — HEPATIC FUNCTION PANEL
Albumin: 4.1 g/dL (ref 3.5–5.2)
Alkaline Phosphatase: 73 U/L (ref 39–117)
Bilirubin, Direct: 0.1 mg/dL (ref 0.0–0.3)

## 2013-05-03 LAB — LIPID PANEL
HDL: 38.7 mg/dL — ABNORMAL LOW (ref >39.00)
Total CHOL/HDL Ratio: 4
VLDL: 25.8 mg/dL (ref 0.0–40.0)

## 2013-05-03 LAB — BASIC METABOLIC PANEL
BUN: 21 mg/dL (ref 6–23)
Calcium: 10.2 mg/dL (ref 8.4–10.5)
GFR: 62.71 mL/min (ref >60.00)
Glucose, Bld: 128 mg/dL — ABNORMAL HIGH (ref 70–99)

## 2013-05-03 LAB — CBC WITH DIFFERENTIAL/PLATELET
Basophils Absolute: 0 10*3/uL (ref 0.0–0.1)
Lymphocytes Relative: 27.3 % (ref 12.0–46.0)
Monocytes Relative: 6.7 % (ref 3.0–12.0)
Neutrophils Relative %: 62.6 % (ref 43.0–77.0)
Platelets: 283 10*3/uL (ref 150.0–400.0)
RDW: 14 % (ref 11.5–14.6)

## 2013-05-03 LAB — TSH: TSH: 2.14 u[IU]/mL (ref 0.35–5.50)

## 2013-05-03 LAB — HEMOGLOBIN A1C: Hgb A1c MFr Bld: 6.8 % — ABNORMAL HIGH (ref 4.6–6.5)

## 2013-05-03 MED ORDER — LEVOTHYROXINE SODIUM 75 MCG PO TABS: ORAL_TABLET | ORAL | Status: DC

## 2013-05-03 NOTE — Patient Instructions (Signed)
Continue lifestyle intervention healthy eating and exercise . Weight loss   weight watchers is less expensive than getting and managing diabetes. Avoid eating out until  You get success. Increase protonix to twice a day for a month. Then go back down to once a day if better. If  persistent or progressive then consider seeing dr Juanda Chance.   Consider getting prevnar vaccine for pneumococcal disease prevention.   Preventive Care for Adults, Female A healthy lifestyle and preventive care can promote health and wellness. Preventive health guidelines for women include the following key practices.  A routine yearly physical is a good way to check with your caregiver about your health and preventive screening. It is a chance to share any concerns and updates on your health, and to receive a thorough exam.  Visit your dentist for a routine exam and preventive care every 6 months. Brush your teeth twice a day and floss once a day. Good oral hygiene prevents tooth decay and gum disease.  The frequency of eye exams is based on your age, health, family medical history, use of contact lenses, and other factors. Follow your caregiver's recommendations for frequency of eye exams.  Eat a healthy diet. Foods like vegetables, fruits, whole grains, low-fat dairy products, and lean protein foods contain the nutrients you need without too many calories. Decrease your intake of foods high in solid fats, added sugars, and salt. Eat the right amount of calories for you.Get information about a proper diet from your caregiver, if necessary.  Regular physical exercise is one of the most important things you can do for your health. Most adults should get at least 150 minutes of moderate-intensity exercise (any activity that increases your heart rate and causes you to sweat) each week. In addition, most adults need muscle-strengthening exercises on 2 or more days a week.  Maintain a healthy weight. The body mass index (BMI)  is a screening tool to identify possible weight problems. It provides an estimate of body fat based on height and weight. Your caregiver can help determine your BMI, and can help you achieve or maintain a healthy weight.For adults 20 years and older:  A BMI below 18.5 is considered underweight.  A BMI of 18.5 to 24.9 is normal.  A BMI of 25 to 29.9 is considered overweight.  A BMI of 30 and above is considered obese.  Maintain normal blood lipids and cholesterol levels by exercising and minimizing your intake of saturated fat. Eat a balanced diet with plenty of fruit and vegetables. Blood tests for lipids and cholesterol should begin at age 74 and be repeated every 5 years. If your lipid or cholesterol levels are high, you are over 50, or you are at high risk for heart disease, you may need your cholesterol levels checked more frequently.Ongoing high lipid and cholesterol levels should be treated with medicines if diet and exercise are not effective.  If you smoke, find out from your caregiver how to quit. If you do not use tobacco, do not start.  If you are pregnant, do not drink alcohol. If you are breastfeeding, be very cautious about drinking alcohol. If you are not pregnant and choose to drink alcohol, do not exceed 1 drink per day. One drink is considered to be 12 ounces (355 mL) of beer, 5 ounces (148 mL) of wine, or 1.5 ounces (44 mL) of liquor.  Avoid use of street drugs. Do not share needles with anyone. Ask for help if you need support or instructions  about stopping the use of drugs.  High blood pressure causes heart disease and increases the risk of stroke. Your blood pressure should be checked at least every 1 to 2 years. Ongoing high blood pressure should be treated with medicines if weight loss and exercise are not effective.  If you are 29 to 69 years old, ask your caregiver if you should take aspirin to prevent strokes.  Diabetes screening involves taking a blood sample to  check your fasting blood sugar level. This should be done once every 3 years, after age 52, if you are within normal weight and without risk factors for diabetes. Testing should be considered at a younger age or be carried out more frequently if you are overweight and have at least 1 risk factor for diabetes.  Breast cancer screening is essential preventive care for women. You should practice "breast self-awareness." This means understanding the normal appearance and feel of your breasts and may include breast self-examination. Any changes detected, no matter how small, should be reported to a caregiver. Women in their 27s and 30s should have a clinical breast exam (CBE) by a caregiver as part of a regular health exam every 1 to 3 years. After age 16, women should have a CBE every year. Starting at age 15, women should consider having a mammography (breast X-ray test) every year. Women who have a family history of breast cancer should talk to their caregiver about genetic screening. Women at a high risk of breast cancer should talk to their caregivers about having magnetic resonance imaging (MRI) and a mammography every year.  The Pap test is a screening test for cervical cancer. A Pap test can show cell changes on the cervix that might become cervical cancer if left untreated. A Pap test is a procedure in which cells are obtained and examined from the lower end of the uterus (cervix).  Women should have a Pap test starting at age 45.  Between ages 48 and 36, Pap tests should be repeated every 2 years.  Beginning at age 77, you should have a Pap test every 3 years as long as the past 3 Pap tests have been normal.  Some women have medical problems that increase the chance of getting cervical cancer. Talk to your caregiver about these problems. It is especially important to talk to your caregiver if a new problem develops soon after your last Pap test. In these cases, your caregiver may recommend more  frequent screening and Pap tests.  The above recommendations are the same for women who have or have not gotten the vaccine for human papillomavirus (HPV).  If you had a hysterectomy for a problem that was not cancer or a condition that could lead to cancer, then you no longer need Pap tests. Even if you no longer need a Pap test, a regular exam is a good idea to make sure no other problems are starting.  If you are between ages 109 and 88, and you have had normal Pap tests going back 10 years, you no longer need Pap tests. Even if you no longer need a Pap test, a regular exam is a good idea to make sure no other problems are starting.  If you have had past treatment for cervical cancer or a condition that could lead to cancer, you need Pap tests and screening for cancer for at least 20 years after your treatment.  If Pap tests have been discontinued, risk factors (such as a new sexual partner)  need to be reassessed to determine if screening should be resumed.  The HPV test is an additional test that may be used for cervical cancer screening. The HPV test looks for the virus that can cause the cell changes on the cervix. The cells collected during the Pap test can be tested for HPV. The HPV test could be used to screen women aged 22 years and older, and should be used in women of any age who have unclear Pap test results. After the age of 3, women should have HPV testing at the same frequency as a Pap test.  Colorectal cancer can be detected and often prevented. Most routine colorectal cancer screening begins at the age of 21 and continues through age 37. However, your caregiver may recommend screening at an earlier age if you have risk factors for colon cancer. On a yearly basis, your caregiver may provide home test kits to check for hidden blood in the stool. Use of a small camera at the end of a tube, to directly examine the colon (sigmoidoscopy or colonoscopy), can detect the earliest forms of  colorectal cancer. Talk to your caregiver about this at age 36, when routine screening begins. Direct examination of the colon should be repeated every 5 to 10 years through age 21, unless early forms of pre-cancerous polyps or small growths are found.  Hepatitis C blood testing is recommended for all people born from [birth year] through 1965 and any individual with known risks for hepatitis C.  Practice safe sex. Use condoms and avoid high-risk sexual practices to reduce the spread of sexually transmitted infections (STIs). STIs include gonorrhea, chlamydia, syphilis, trichomonas, herpes, HPV, and human immunodeficiency virus (HIV). Herpes, HIV, and HPV are viral illnesses that have no cure. They can result in disability, cancer, and death. Sexually active women aged 58 and younger should be checked for chlamydia. Older women with new or multiple partners should also be tested for chlamydia. Testing for other STIs is recommended if you are sexually active and at increased risk.  Osteoporosis is a disease in which the bones lose minerals and strength with aging. This can result in serious bone fractures. The risk of osteoporosis can be identified using a bone density scan. Women ages 56 and over and women at risk for fractures or osteoporosis should discuss screening with their caregivers. Ask your caregiver whether you should take a calcium supplement or vitamin D to reduce the rate of osteoporosis.  Menopause can be associated with physical symptoms and risks. Hormone replacement therapy is available to decrease symptoms and risks. You should talk to your caregiver about whether hormone replacement therapy is right for you.  Use sunscreen with sun protection factor (SPF) of 30 or more. Apply sunscreen liberally and repeatedly throughout the day. You should seek shade when your shadow is shorter than you. Protect yourself by wearing long sleeves, pants, a wide-brimmed hat, and sunglasses year round, whenever  you are outdoors.  Once a month, do a whole body skin exam, using a mirror to look at the skin on your back. Notify your caregiver of new moles, moles that have irregular borders, moles that are larger than a pencil eraser, or moles that have changed in shape or color.  Stay current with required immunizations.  Influenza. You need a dose every fall (or winter). The composition of the flu vaccine changes each year, so being vaccinated once is not enough.  Pneumococcal polysaccharide. You need 1 to 2 doses if you smoke cigarettes or  if you have certain chronic medical conditions. You need 1 dose at age 48 (or older) if you have never been vaccinated.  Tetanus, diphtheria, pertussis (Tdap, Td). Get 1 dose of Tdap vaccine if you are younger than age 55, are over 101 and have contact with an infant, are a Research scientist (physical sciences), are pregnant, or simply want to be protected from whooping cough. After that, you need a Td booster dose every 10 years. Consult your caregiver if you have not had at least 3 tetanus and diphtheria-containing shots sometime in your life or have a deep or dirty wound.  HPV. You need this vaccine if you are a woman age 34 or younger. The vaccine is given in 3 doses over 6 months.  Measles, mumps, rubella (MMR). You need at least 1 dose of MMR if you were born in 1957 or later. You may also need a second dose.  Meningococcal. If you are age 74 to 36 and a first-year college student living in a residence hall, or have one of several medical conditions, you need to get vaccinated against meningococcal disease. You may also need additional booster doses.  Zoster (shingles). If you are age 20 or older, you should get this vaccine.  Varicella (chickenpox). If you have never had chickenpox or you were vaccinated but received only 1 dose, talk to your caregiver to find out if you need this vaccine.  Hepatitis A. You need this vaccine if you have a specific risk factor for hepatitis A virus  infection or you simply wish to be protected from this disease. The vaccine is usually given as 2 doses, 6 to 18 months apart.  Hepatitis B. You need this vaccine if you have a specific risk factor for hepatitis B virus infection or you simply wish to be protected from this disease. The vaccine is given in 3 doses, usually over 6 months. Preventive Services / Frequency Ages 57 to 75  Blood pressure check.** / Every 1 to 2 years.  Lipid and cholesterol check.** / Every 5 years beginning at age 36.  Clinical breast exam.** / Every 3 years for women in their 61s and 30s.  Pap test.** / Every 2 years from ages 60 through 93. Every 3 years starting at age 74 through age 31 or 69 with a history of 3 consecutive normal Pap tests.  HPV screening.** / Every 3 years from ages 56 through ages 5 to 44 with a history of 3 consecutive normal Pap tests.  Hepatitis C blood test.** / For any individual with known risks for hepatitis C.  Skin self-exam. / Monthly.  Influenza immunization.** / Every year.  Pneumococcal polysaccharide immunization.** / 1 to 2 doses if you smoke cigarettes or if you have certain chronic medical conditions.  Tetanus, diphtheria, pertussis (Tdap, Td) immunization. / A one-time dose of Tdap vaccine. After that, you need a Td booster dose every 10 years.  HPV immunization. / 3 doses over 6 months, if you are 67 and younger.  Measles, mumps, rubella (MMR) immunization. / You need at least 1 dose of MMR if you were born in 1957 or later. You may also need a second dose.  Meningococcal immunization. / 1 dose if you are age 13 to 69 and a first-year college student living in a residence hall, or have one of several medical conditions, you need to get vaccinated against meningococcal disease. You may also need additional booster doses.  Varicella immunization.** / Consult your caregiver.  Hepatitis A immunization.** /  Consult your caregiver. 2 doses, 6 to 18 months  apart.  Hepatitis B immunization.** / Consult your caregiver. 3 doses usually over 6 months. Ages 18 to 49  Blood pressure check.** / Every 1 to 2 years.  Lipid and cholesterol check.** / Every 5 years beginning at age 32.  Clinical breast exam.** / Every year after age 67.  Mammogram.** / Every year beginning at age 45 and continuing for as long as you are in good health. Consult with your caregiver.  Pap test.** / Every 3 years starting at age 41 through age 60 or 43 with a history of 3 consecutive normal Pap tests.  HPV screening.** / Every 3 years from ages 68 through ages 61 to 54 with a history of 3 consecutive normal Pap tests.  Fecal occult blood test (FOBT) of stool. / Every year beginning at age 52 and continuing until age 38. You may not need to do this test if you get a colonoscopy every 10 years.  Flexible sigmoidoscopy or colonoscopy.** / Every 5 years for a flexible sigmoidoscopy or every 10 years for a colonoscopy beginning at age 28 and continuing until age 72.  Hepatitis C blood test.** / For all people born from [birth year] through 1965 and any individual with known risks for hepatitis C.  Skin self-exam. / Monthly.  Influenza immunization.** / Every year.  Pneumococcal polysaccharide immunization.** / 1 to 2 doses if you smoke cigarettes or if you have certain chronic medical conditions.  Tetanus, diphtheria, pertussis (Tdap, Td) immunization.** / A one-time dose of Tdap vaccine. After that, you need a Td booster dose every 10 years.  Measles, mumps, rubella (MMR) immunization. / You need at least 1 dose of MMR if you were born in 1957 or later. You may also need a second dose.  Varicella immunization.** / Consult your caregiver.  Meningococcal immunization.** / Consult your caregiver.  Hepatitis A immunization.** / Consult your caregiver. 2 doses, 6 to 18 months apart.  Hepatitis B immunization.** / Consult your caregiver. 3 doses, usually over 6 months. Ages 24  and over  Blood pressure check.** / Every 1 to 2 years.  Lipid and cholesterol check.** / Every 5 years beginning at age 110.  Clinical breast exam.** / Every year after age 82.  Mammogram.** / Every year beginning at age 56 and continuing for as long as you are in good health. Consult with your caregiver.  Pap test.** / Every 3 years starting at age 32 through age 59 or 48 with a 3 consecutive normal Pap tests. Testing can be stopped between 65 and 70 with 3 consecutive normal Pap tests and no abnormal Pap or HPV tests in the past 10 years.  HPV screening.** / Every 3 years from ages 63 through ages 10 or 49 with a history of 3 consecutive normal Pap tests. Testing can be stopped between 65 and 70 with 3 consecutive normal Pap tests and no abnormal Pap or HPV tests in the past 10 years.  Fecal occult blood test (FOBT) of stool. / Every year beginning at age 20 and continuing until age 1. You may not need to do this test if you get a colonoscopy every 10 years.  Flexible sigmoidoscopy or colonoscopy.** / Every 5 years for a flexible sigmoidoscopy or every 10 years for a colonoscopy beginning at age 66 and continuing until age 35.  Hepatitis C blood test.** / For all people born from [birth year] through 1967 and any individual with known risks for  hepatitis C.  Osteoporosis screening.** / A one-time screening for women ages 29 and over and women at risk for fractures or osteoporosis.  Skin self-exam. / Monthly.  Influenza immunization.** / Every year.  Pneumococcal polysaccharide immunization.** / 1 dose at age 79 (or older) if you have never been vaccinated.  Tetanus, diphtheria, pertussis (Tdap, Td) immunization. / A one-time dose of Tdap vaccine if you are over 65 and have contact with an infant, are a Research scientist (physical sciences), or simply want to be protected from whooping cough. After that, you need a Td booster dose every 10 years.  Varicella immunization.** / Consult your  caregiver.  Meningococcal immunization.** / Consult your caregiver.  Hepatitis A immunization.** / Consult your caregiver. 2 doses, 6 to 18 months apart.  Hepatitis B immunization.** / Check with your caregiver. 3 doses, usually over 6 months. ** Family history and personal history of risk and conditions may change your caregiver's recommendations. Document Released: 10/11/2001 Document Revised: 11/07/2011 Document Reviewed: 01/10/2011 [facility] Patient Information 2014 [place], [place].

## 2013-05-03 NOTE — Progress Notes (Signed)
Chief Complaint  Patient presents with  . Medicare Wellness  . Hypothyroidism  . Hypertension  . Hyperlipidemia    HPI: Patient comes in today for Preventive Medicare wellness visit . No major injuries, ed visits ,hospitalizations , new medications since last visit.  herlp no se of med LIPIDS no se of med Thyroid same dosing no hair changes edema Weight about the same does water aerobics still    Hearing:  Ok   Vision:  No limitations at present . Last eye check UTD  Safety:  Has smoke detector and wears seat belts.  No firearms. No excess sun exposure. Sees dentist regularly.  Falls: over a dog line tripping.   Advance directive :  Reviewed    Memory: Felt to be good  , no concern from her or her family.  Depression: No anhedonia unusual crying or depressive symptoms  Nutrition: Eats well balanced diet; adequate calcium and vitamin D. No swallowing chewing problems.  Injury: no major injuries in the last six months.  Other healthcare providers:  Reviewed today .  Social:  Lives with spouse married. No pets.   Preventive parameters: up-to-date  Reviewed   ADLS:   There are no problems or need for assistance  driving, feeding, obtaining food, dressing, toileting and bathing, managing money using phone. She is independent.  EXERCISE/ HABITS  Per week  Water aerobics twice a week.  No tobacco    etoh no,    ROS:  GEN/ HEENT: No fever, significant weight changes sweats headaches vision problems hearing changes, CV/ PULM; No chest pain shortness of breath cough, syncope,edema  change in exercise tolerance. GI /GU: No adominal pain, vomiting, change in bowel habits. No blood in the stool. No significant GU symptoms. SKIN/HEME: ,no acute skin rashes suspicious lesions or bleeding. No lymphadenopathy, nodules, masses.  NEURO/ PSYCH:  No neurologic signs such as weakness numbness. No depression anxiety. IMM/ Allergy: No unusual infections.  Allergy .   REST of 12 system  review negative except as per HPI   Past Medical History  Diagnosis Date  . Allergy   . GERD (gastroesophageal reflux disease)     hx of stricture  2005  . Hyperlipidemia   . Hypertension   . Nephrolithiasis   . Fasting hyperglycemia 2007    Family History  Problem Relation Age of Onset  . Cancer Son     liver  . Cancer Father     brain  . Diabetes Sister   . Schizophrenia Son     History   Social History  . Marital Status: Married    Spouse Name: N/A    Number of Children: N/A  . Years of Education: N/A   Social History Main Topics  . Smoking status: Never Smoker   . Smokeless tobacco: None  . Alcohol Use: No  . Drug Use: No  . Sexual Activity: None   Other Topics Concern  . None   Social History Narrative   Married   hh of 2   4 dogs   G2 P2   Water exercise.          Outpatient Encounter Prescriptions as of 05/03/2013  Medication Sig Dispense Refill  . atorvastatin (LIPITOR) 20 MG tablet Take 1 tablet (20 mg total) by mouth daily.  90 tablet  3  . levothyroxine (SYNTHROID, LEVOTHROID) 75 MCG tablet TAKE 1 TABLET DAILY  90 tablet  3  . metFORMIN (GLUCOPHAGE-XR) 500 MG 24 hr tablet Take 2 tablets (1,000  mg total) by mouth daily with breakfast.  180 tablet  3  . pantoprazole (PROTONIX) 40 MG tablet Take 1 tablet (40 mg total) by mouth daily.  90 tablet  3  . triamterene-hydrochlorothiazide (MAXZIDE-25) 37.5-25 MG per tablet Take 1 each (1 tablet total) by mouth daily.  90 tablet  3  . [DISCONTINUED] levothyroxine (SYNTHROID, LEVOTHROID) 75 MCG tablet TAKE 1 TABLET DAILY  90 tablet  0  . [DISCONTINUED] sulfamethoxazole-trimethoprim (SEPTRA DS) 800-160 MG per tablet Take 1 tablet by mouth 2 (two) times daily. For uti  10 tablet  0   No facility-administered encounter medications on file as of 05/03/2013.    EXAM:  BP 138/84  Pulse 75  Temp(Src) 97.5 F (36.4 C) (Oral)  Wt 177 lb (80.287 kg)  BMI 32.12 kg/m2  SpO2 96%  Body mass index is 32.12  kg/(m^2).  Physical Exam: Vital signs reviewed [id number] is a well-developed well-nourished alert cooperative   who appears stated age in no acute distress.  HEENT: normocephalic atraumatic , Eyes: PERRL EOM's full, conjunctiva clear, Nares: paten,t no deformity discharge or tenderness., Ears: no deformity EAC's clear TMs with normal landmarks. Mouth: clear OP, no lesions, edema.  Moist mucous membranes. Dentition in adequate repair. NECK: supple without masses, thyromegaly or bruits. CHEST/PULM:  Clear to auscultation and percussion breath sounds equal no wheeze , rales or rhonchi. No chest wall deformities or tenderness. Breast: normal by inspection . No dimpling, discharge, masses, tenderness or discharge . CV: PMI is nondisplaced, S1 S2 no gallops, murmurs, rubs. Peripheral pulses are full without delay.No JVD .  ABDOMEN: Bowel sounds normal nontender  No guard or rebound, no hepato splenomegal no CVA tenderness.  No hernia. Extremtities:  No clubbing cyanosis or edema, no acute joint swelling or redness no focal atrophy no ulcers or  lesins  NEURO:  Oriented x3, cranial nerves 3-12 appear to be intact, no obvious focal weakness,gait within normal limits no abnormal reflexes or asymmetrical SKIN: No acute rashes normal turgor, color, no bruising or petechiae. Sun changes  PSYCH: Oriented, good eye contact, no obvious depression anxiety, cognition and judgment appear normal. LN: no cervical axillary inguinal adenopathy No noted deficits in memory, attention, and speech.   Lab Results  Component Value Date   WBC 9.7 05/03/2013   HGB 13.2 05/03/2013   HCT 39.2 05/03/2013   PLT 283.0 05/03/2013   GLUCOSE 128* 05/03/2013   CHOL 157 05/03/2013   TRIG 129.0 05/03/2013   HDL 38.70* 05/03/2013   LDLCALC 93 05/03/2013   ALT 36* 05/03/2013   AST 33 05/03/2013   NA 146* 05/03/2013   K 4.3 05/03/2013   CL 108 05/03/2013   CREATININE 0.9 05/03/2013   BUN 21 05/03/2013   CO2 31 05/03/2013   TSH 2.14 05/03/2013   INR 1.12  11/19/2009   HGBA1C 6.8* 05/03/2013    ASSESSMENT AND PLAN:  Discussed the following assessment and plan:  Encounter for preventive health examination - Plan: Basic metabolic panel, CBC with Differential, Hemoglobin A1c, Hepatic function panel, Lipid panel, TSH  HYPERLIPIDEMIA - on meds - Plan: Basic metabolic panel, CBC with Differential, Hemoglobin A1c, Hepatic function panel, Lipid panel, TSH  HYPERTENSION - controlled - Plan: Basic metabolic panel, CBC with Differential, Hemoglobin A1c, Hepatic function panel, Lipid panel, TSH  GERD - Plan: Basic metabolic panel, CBC with Differential, Hemoglobin A1c, Hepatic function panel, Lipid panel, TSH  Pre-diabetes - intesnify lsi eats out 3 x per week. weight wathcers helps but "too expensive" counsel  about "expense" of diabetes if occures - Plan: Basic metabolic panel, CBC with Differential, Hemoglobin A1c, Hepatic function panel, Lipid panel, TSH  Throat clearing - Possible aggravation of reflux ;short-term trial of twice a day Protonix ;weight loss would help; remote history of esophageal dilatation and  HYPOTHYROIDISM - refill med tsh pending Counseled regarding healthy nutrition, exercise, sleep, injury prevention, calcium vit d and healthy weight . Suggest going back to weight watchers. consdier change bp med to acei at future visits cause of the hyperglycemia issue Patient Care Team: Madelin Headings, MD as PCP - General Valeria Batman, MD (Orthopedic Surgery) Claudie Revering, MD (Dermatology) Dorothyann Gibbs, MD (Ophthalmology)  Patient Instructions  Continue lifestyle intervention healthy eating and exercise . Weight loss   weight watchers is less expensive than getting and managing diabetes. Avoid eating out until  You get success. Increase protonix to twice a day for a month. Then go back down to once a day if better. If  persistent or progressive then consider seeing dr Juanda Chance.   Consider getting prevnar vaccine  for pneumococcal disease prevention.   Preventive Care for Adults, Female A healthy lifestyle and preventive care can promote health and wellness. Preventive health guidelines for women include the following key practices.  A routine yearly physical is a good way to check with your caregiver about your health and preventive screening. It is a chance to share any concerns and updates on your health, and to receive a thorough exam.  Visit your dentist for a routine exam and preventive care every 6 months. Brush your teeth twice a day and floss once a day. Good oral hygiene prevents tooth decay and gum disease.  The frequency of eye exams is based on your age, health, family medical history, use of contact lenses, and other factors. Follow your caregiver's recommendations for frequency of eye exams.  Eat a healthy diet. Foods like vegetables, fruits, whole grains, low-fat dairy products, and lean protein foods contain the nutrients you need without too many calories. Decrease your intake of foods high in solid fats, added sugars, and salt. Eat the right amount of calories for you.Get information about a proper diet from your caregiver, if necessary.  Regular physical exercise is one of the most important things you can do for your health. Most adults should get at least 150 minutes of moderate-intensity exercise (any activity that increases your heart rate and causes you to sweat) each week. In addition, most adults need muscle-strengthening exercises on 2 or more days a week.  Maintain a healthy weight. The body mass index (BMI) is a screening tool to identify possible weight problems. It provides an estimate of body fat based on height and weight. Your caregiver can help determine your BMI, and can help you achieve or maintain a healthy weight.For adults 20 years and older:  A BMI below 18.5 is considered underweight.  A BMI of 18.5 to 24.9 is normal.  A BMI of 25 to 29.9 is considered  overweight.  A BMI of 30 and above is considered obese.  Maintain normal blood lipids and cholesterol levels by exercising and minimizing your intake of saturated fat. Eat a balanced diet with plenty of fruit and vegetables. Blood tests for lipids and cholesterol should begin at age 12 and be repeated every 5 years. If your lipid or cholesterol levels are high, you are over 50, or you are at high risk for heart disease, you may need your cholesterol levels checked  more frequently.Ongoing high lipid and cholesterol levels should be treated with medicines if diet and exercise are not effective.  If you smoke, find out from your caregiver how to quit. If you do not use tobacco, do not start.  If you are pregnant, do not drink alcohol. If you are breastfeeding, be very cautious about drinking alcohol. If you are not pregnant and choose to drink alcohol, do not exceed 1 drink per day. One drink is considered to be 12 ounces (355 mL) of beer, 5 ounces (148 mL) of wine, or 1.5 ounces (44 mL) of liquor.  Avoid use of street drugs. Do not share needles with anyone. Ask for help if you need support or instructions about stopping the use of drugs.  High blood pressure causes heart disease and increases the risk of stroke. Your blood pressure should be checked at least every 1 to 2 years. Ongoing high blood pressure should be treated with medicines if weight loss and exercise are not effective.  If you are 62 to 69 years old, ask your caregiver if you should take aspirin to prevent strokes.  Diabetes screening involves taking a blood sample to check your fasting blood sugar level. This should be done once every 3 years, after age 75, if you are within normal weight and without risk factors for diabetes. Testing should be considered at a younger age or be carried out more frequently if you are overweight and have at least 1 risk factor for diabetes.  Breast cancer screening is essential preventive care for  women. You should practice "breast self-awareness." This means understanding the normal appearance and feel of your breasts and may include breast self-examination. Any changes detected, no matter how small, should be reported to a caregiver. Women in their 53s and 30s should have a clinical breast exam (CBE) by a caregiver as part of a regular health exam every 1 to 3 years. After age 46, women should have a CBE every year. Starting at age 2, women should consider having a mammography (breast X-ray test) every year. Women who have a family history of breast cancer should talk to their caregiver about genetic screening. Women at a high risk of breast cancer should talk to their caregivers about having magnetic resonance imaging (MRI) and a mammography every year.  The Pap test is a screening test for cervical cancer. A Pap test can show cell changes on the cervix that might become cervical cancer if left untreated. A Pap test is a procedure in which cells are obtained and examined from the lower end of the uterus (cervix).  Women should have a Pap test starting at age 36.  Between ages 14 and 48, Pap tests should be repeated every 2 years.  Beginning at age 62, you should have a Pap test every 3 years as long as the past 3 Pap tests have been normal.  Some women have medical problems that increase the chance of getting cervical cancer. Talk to your caregiver about these problems. It is especially important to talk to your caregiver if a new problem develops soon after your last Pap test. In these cases, your caregiver may recommend more frequent screening and Pap tests.  The above recommendations are the same for women who have or have not gotten the vaccine for human papillomavirus (HPV).  If you had a hysterectomy for a problem that was not cancer or a condition that could lead to cancer, then you no longer need Pap tests. Even if  you no longer need a Pap test, a regular exam is a good idea to make  sure no other problems are starting.  If you are between ages 89 and 33, and you have had normal Pap tests going back 10 years, you no longer need Pap tests. Even if you no longer need a Pap test, a regular exam is a good idea to make sure no other problems are starting.  If you have had past treatment for cervical cancer or a condition that could lead to cancer, you need Pap tests and screening for cancer for at least 20 years after your treatment.  If Pap tests have been discontinued, risk factors (such as a new sexual partner) need to be reassessed to determine if screening should be resumed.  The HPV test is an additional test that may be used for cervical cancer screening. The HPV test looks for the virus that can cause the cell changes on the cervix. The cells collected during the Pap test can be tested for HPV. The HPV test could be used to screen women aged 105 years and older, and should be used in women of any age who have unclear Pap test results. After the age of 92, women should have HPV testing at the same frequency as a Pap test.  Colorectal cancer can be detected and often prevented. Most routine colorectal cancer screening begins at the age of 34 and continues through age 22. However, your caregiver may recommend screening at an earlier age if you have risk factors for colon cancer. On a yearly basis, your caregiver may provide home test kits to check for hidden blood in the stool. Use of a small camera at the end of a tube, to directly examine the colon (sigmoidoscopy or colonoscopy), can detect the earliest forms of colorectal cancer. Talk to your caregiver about this at age 65, when routine screening begins. Direct examination of the colon should be repeated every 5 to 10 years through age 23, unless early forms of pre-cancerous polyps or small growths are found.  Hepatitis C blood testing is recommended for all people born from [birth year] through 1965 and any individual with known risks  for hepatitis C.  Practice safe sex. Use condoms and avoid high-risk sexual practices to reduce the spread of sexually transmitted infections (STIs). STIs include gonorrhea, chlamydia, syphilis, trichomonas, herpes, HPV, and human immunodeficiency virus (HIV). Herpes, HIV, and HPV are viral illnesses that have no cure. They can result in disability, cancer, and death. Sexually active women aged 31 and younger should be checked for chlamydia. Older women with new or multiple partners should also be tested for chlamydia. Testing for other STIs is recommended if you are sexually active and at increased risk.  Osteoporosis is a disease in which the bones lose minerals and strength with aging. This can result in serious bone fractures. The risk of osteoporosis can be identified using a bone density scan. Women ages 6 and over and women at risk for fractures or osteoporosis should discuss screening with their caregivers. Ask your caregiver whether you should take a calcium supplement or vitamin D to reduce the rate of osteoporosis.  Menopause can be associated with physical symptoms and risks. Hormone replacement therapy is available to decrease symptoms and risks. You should talk to your caregiver about whether hormone replacement therapy is right for you.  Use sunscreen with sun protection factor (SPF) of 30 or more. Apply sunscreen liberally and repeatedly throughout the day. You should seek  shade when your shadow is shorter than you. Protect yourself by wearing long sleeves, pants, a wide-brimmed hat, and sunglasses year round, whenever you are outdoors.  Once a month, do a whole body skin exam, using a mirror to look at the skin on your back. Notify your caregiver of new moles, moles that have irregular borders, moles that are larger than a pencil eraser, or moles that have changed in shape or color.  Stay current with required immunizations.  Influenza. You need a dose every fall (or winter). The  composition of the flu vaccine changes each year, so being vaccinated once is not enough.  Pneumococcal polysaccharide. You need 1 to 2 doses if you smoke cigarettes or if you have certain chronic medical conditions. You need 1 dose at age 86 (or older) if you have never been vaccinated.  Tetanus, diphtheria, pertussis (Tdap, Td). Get 1 dose of Tdap vaccine if you are younger than age 64, are over 65 and have contact with an infant, are a Research scientist (physical sciences), are pregnant, or simply want to be protected from whooping cough. After that, you need a Td booster dose every 10 years. Consult your caregiver if you have not had at least 3 tetanus and diphtheria-containing shots sometime in your life or have a deep or dirty wound.  HPV. You need this vaccine if you are a woman age 63 or younger. The vaccine is given in 3 doses over 6 months.  Measles, mumps, rubella (MMR). You need at least 1 dose of MMR if you were born in 1957 or later. You may also need a second dose.  Meningococcal. If you are age 21 to 58 and a first-year college student living in a residence hall, or have one of several medical conditions, you need to get vaccinated against meningococcal disease. You may also need additional booster doses.  Zoster (shingles). If you are age 38 or older, you should get this vaccine.  Varicella (chickenpox). If you have never had chickenpox or you were vaccinated but received only 1 dose, talk to your caregiver to find out if you need this vaccine.  Hepatitis A. You need this vaccine if you have a specific risk factor for hepatitis A virus infection or you simply wish to be protected from this disease. The vaccine is usually given as 2 doses, 6 to 18 months apart.  Hepatitis B. You need this vaccine if you have a specific risk factor for hepatitis B virus infection or you simply wish to be protected from this disease. The vaccine is given in 3 doses, usually over 6 months. Preventive Services /  Frequency Ages 42 to 48  Blood pressure check.** / Every 1 to 2 years.  Lipid and cholesterol check.** / Every 5 years beginning at age 65.  Clinical breast exam.** / Every 3 years for women in their 67s and 30s.  Pap test.** / Every 2 years from ages 61 through 25. Every 3 years starting at age 42 through age 32 or 40 with a history of 3 consecutive normal Pap tests.  HPV screening.** / Every 3 years from ages 103 through ages 44 to 37 with a history of 3 consecutive normal Pap tests.  Hepatitis C blood test.** / For any individual with known risks for hepatitis C.  Skin self-exam. / Monthly.  Influenza immunization.** / Every year.  Pneumococcal polysaccharide immunization.** / 1 to 2 doses if you smoke cigarettes or if you have certain chronic medical conditions.  Tetanus, diphtheria, pertussis (  Tdap, Td) immunization. / A one-time dose of Tdap vaccine. After that, you need a Td booster dose every 10 years.  HPV immunization. / 3 doses over 6 months, if you are 67 and younger.  Measles, mumps, rubella (MMR) immunization. / You need at least 1 dose of MMR if you were born in 1957 or later. You may also need a second dose.  Meningococcal immunization. / 1 dose if you are age 37 to 34 and a first-year college student living in a residence hall, or have one of several medical conditions, you need to get vaccinated against meningococcal disease. You may also need additional booster doses.  Varicella immunization.** / Consult your caregiver.  Hepatitis A immunization.** / Consult your caregiver. 2 doses, 6 to 18 months apart.  Hepatitis B immunization.** / Consult your caregiver. 3 doses usually over 6 months. Ages 64 to 6  Blood pressure check.** / Every 1 to 2 years.  Lipid and cholesterol check.** / Every 5 years beginning at age 31.  Clinical breast exam.** / Every year after age 27.  Mammogram.** / Every year beginning at age 89 and continuing for as long as you are in  good health. Consult with your caregiver.  Pap test.** / Every 3 years starting at age 61 through age 10 or 74 with a history of 3 consecutive normal Pap tests.  HPV screening.** / Every 3 years from ages 89 through ages 35 to 82 with a history of 3 consecutive normal Pap tests.  Fecal occult blood test (FOBT) of stool. / Every year beginning at age 77 and continuing until age 12. You may not need to do this test if you get a colonoscopy every 10 years.  Flexible sigmoidoscopy or colonoscopy.** / Every 5 years for a flexible sigmoidoscopy or every 10 years for a colonoscopy beginning at age 14 and continuing until age 66.  Hepatitis C blood test.** / For all people born from [birth year] through 1967 and any individual with known risks for hepatitis C.  Skin self-exam. / Monthly.  Influenza immunization.** / Every year.  Pneumococcal polysaccharide immunization.** / 1 to 2 doses if you smoke cigarettes or if you have certain chronic medical conditions.  Tetanus, diphtheria, pertussis (Tdap, Td) immunization.** / A one-time dose of Tdap vaccine. After that, you need a Td booster dose every 10 years.  Measles, mumps, rubella (MMR) immunization. / You need at least 1 dose of MMR if you were born in 1957 or later. You may also need a second dose.  Varicella immunization.** / Consult your caregiver.  Meningococcal immunization.** / Consult your caregiver.  Hepatitis A immunization.** / Consult your caregiver. 2 doses, 6 to 18 months apart.  Hepatitis B immunization.** / Consult your caregiver. 3 doses, usually over 6 months. Ages 71 and over  Blood pressure check.** / Every 1 to 2 years.  Lipid and cholesterol check.** / Every 5 years beginning at age 85.  Clinical breast exam.** / Every year after age 100.  Mammogram.** / Every year beginning at age 63 and continuing for as long as you are in good health. Consult with your caregiver.  Pap test.** / Every 3 years starting at age 26 through  age 60 or 48 with a 3 consecutive normal Pap tests. Testing can be stopped between 65 and 70 with 3 consecutive normal Pap tests and no abnormal Pap or HPV tests in the past 10 years.  HPV screening.** / Every 3 years from ages 68 through ages 36  or 70 with a history of 3 consecutive normal Pap tests. Testing can be stopped between 65 and 70 with 3 consecutive normal Pap tests and no abnormal Pap or HPV tests in the past 10 years.  Fecal occult blood test (FOBT) of stool. / Every year beginning at age 6 and continuing until age 44. You may not need to do this test if you get a colonoscopy every 10 years.  Flexible sigmoidoscopy or colonoscopy.** / Every 5 years for a flexible sigmoidoscopy or every 10 years for a colonoscopy beginning at age 44 and continuing until age 55.  Hepatitis C blood test.** / For all people born from [birth year] through 1967 and any individual with known risks for hepatitis C.  Osteoporosis screening.** / A one-time screening for women ages 25 and over and women at risk for fractures or osteoporosis.  Skin self-exam. / Monthly.  Influenza immunization.** / Every year.  Pneumococcal polysaccharide immunization.** / 1 dose at age 41 (or older) if you have never been vaccinated.  Tetanus, diphtheria, pertussis (Tdap, Td) immunization. / A one-time dose of Tdap vaccine if you are over 65 and have contact with an infant, are a Research scientist (physical sciences), or simply want to be protected from whooping cough. After that, you need a Td booster dose every 10 years.  Varicella immunization.** / Consult your caregiver.  Meningococcal immunization.** / Consult your caregiver.  Hepatitis A immunization.** / Consult your caregiver. 2 doses, 6 to 18 months apart.  Hepatitis B immunization.** / Check with your caregiver. 3 doses, usually over 6 months. ** Family history and personal history of risk and conditions may change your caregiver's recommendations. Document Released: 10/11/2001 Document  Revised: 11/07/2011 Document Reviewed: 01/10/2011 [facility] Patient Information 2014 [place], [place].     Neta Mends. Panosh M.D.  Health Maintenance  Topic Date Due  . Influenza Vaccine  03/29/2013  . Pneumococcal Polysaccharide Vaccine Age 23 And Over  04/29/2014  . Tetanus/tdap  08/29/2013  . Colonoscopy  12/22/2014  . Mammogram  02/23/2015  . Zostavax  Completed   Health Maintenance Review

## 2013-05-09 ENCOUNTER — Other Ambulatory Visit: Payer: Self-pay | Admitting: Family Medicine

## 2013-05-09 DIAGNOSIS — R7303 Prediabetes: Secondary | ICD-10-CM

## 2013-05-09 DIAGNOSIS — R945 Abnormal results of liver function studies: Secondary | ICD-10-CM

## 2013-05-09 DIAGNOSIS — R739 Hyperglycemia, unspecified: Secondary | ICD-10-CM

## 2013-07-04 ENCOUNTER — Other Ambulatory Visit: Payer: Self-pay

## 2013-09-02 ENCOUNTER — Encounter: Payer: Self-pay | Admitting: Internal Medicine

## 2013-09-02 ENCOUNTER — Ambulatory Visit (INDEPENDENT_AMBULATORY_CARE_PROVIDER_SITE_OTHER): Payer: Managed Care, Other (non HMO) | Admitting: Internal Medicine

## 2013-09-02 VITALS — BP 140/80 | HR 94 | Temp 97.6°F | Wt 181.0 lb

## 2013-09-02 DIAGNOSIS — N390 Urinary tract infection, site not specified: Secondary | ICD-10-CM

## 2013-09-02 DIAGNOSIS — R300 Dysuria: Secondary | ICD-10-CM

## 2013-09-02 LAB — POCT URINALYSIS DIPSTICK
Bilirubin, UA: NEGATIVE
Glucose, UA: NEGATIVE
Ketones, UA: NEGATIVE
Nitrite, UA: NEGATIVE
PROTEIN UA: NEGATIVE
Urobilinogen, UA: 0.2
pH, UA: 5.5

## 2013-09-02 MED ORDER — CIPROFLOXACIN HCL 500 MG PO TABS: 500.0000 mg | ORAL_TABLET | Freq: Two times a day (BID) | ORAL | Status: DC

## 2013-09-02 NOTE — Progress Notes (Signed)
Pre visit review using our clinic review tool, if applicable. No additional management support is needed unless otherwise documented below in the visit note. Chief Complaint  Patient presents with  . Dysuria    HPI: Patient comes in today for SDA for  new problem evaluation. Onset at x mas and getting worse . No fever chills  Pain and nausea  cystex   ? Helped some  Very painful but no fever hematuria . Some bladder tenderness  Last rx septra in summer  ROS: See pertinent positives and negatives per HPI.  Past Medical History  Diagnosis Date  . Allergy   . GERD (gastroesophageal reflux disease)     hx of stricture  2005  . Hyperlipidemia   . Hypertension   . Nephrolithiasis   . Fasting hyperglycemia 2007    Family History  Problem Relation Age of Onset  . Cancer Son     liver  . Cancer Father     brain  . Diabetes Sister   . Schizophrenia Son     History   Social History  . Marital Status: Married    Spouse Name: N/A    Number of Children: N/A  . Years of Education: N/A   Social History Main Topics  . Smoking status: Never Smoker   . Smokeless tobacco: None  . Alcohol Use: No  . Drug Use: No  . Sexual Activity: None   Other Topics Concern  . None   Social History Narrative   Married   hh of 2   4 dogs   G2 P2   Water exercise.          Outpatient Encounter Prescriptions as of 09/02/2013  Medication Sig  . atorvastatin (LIPITOR) 20 MG tablet Take 1 tablet (20 mg total) by mouth daily.  [name]. levothyroxine (SYNTHROID, LEVOTHROID) 75 MCG tablet TAKE 1 TABLET DAILY  . metFORMIN (GLUCOPHAGE-XR) 500 MG 24 hr tablet Take 2 tablets (1,000 mg total) by mouth daily with breakfast.  . pantoprazole (PROTONIX) 40 MG tablet Take 1 tablet (40 mg total) by mouth daily.  [name]. triamterene-hydrochlorothiazide (MAXZIDE-25) 37.5-25 MG per tablet Take 1 each (1 tablet total) by mouth daily.  . ciprofloxacin (CIPRO) 500 MG tablet Take 1 tablet (500 mg total) by mouth 2 (two) times  daily.    EXAM:  BP 140/80  Pulse 94  Temp(Src) 97.6 F (36.4 C) (Oral)  Wt 181 lb (82.101 kg)  SpO2 96%  Body mass index is 32.85 kg/(m^2).  GENERAL: vitals reviewed and listed above, alert, oriented, appears well hydrated and in no acute distress  Uncomfortable   In nad non toxic  Abdomen:  Sof,t normal bowel sounds without hepatosplenomegaly, no guarding rebound or masses no CVA tenderness tender suprapubic area no massess noted  PSYCH: pleasant and cooperative, no obvious depression or anxiety  ASSESSMENT AND PLAN:  Discussed the following assessment and plan:  Acute lower UTI - hx of same  typical cipro fu if not better quickly   Dysuria - Plan: POC Urinalysis Dipstick Hx of same  Empiric rx at this time  -Patient advised to return or notify health care team  if symptoms worsen or persist or new concerns arise.  Patient Instructions  uti  Take all med   Expect resolve buy end of med      Neta MendsWanda K. Panosh M.D.

## 2013-09-02 NOTE — Patient Instructions (Signed)
uti  Take all med   Expect resolve buy end of med

## 2013-09-25 ENCOUNTER — Other Ambulatory Visit: Payer: Self-pay | Admitting: Dermatology

## 2013-10-25 ENCOUNTER — Other Ambulatory Visit (INDEPENDENT_AMBULATORY_CARE_PROVIDER_SITE_OTHER): Payer: Managed Care, Other (non HMO)

## 2013-10-25 DIAGNOSIS — R739 Hyperglycemia, unspecified: Secondary | ICD-10-CM

## 2013-10-25 DIAGNOSIS — R7309 Other abnormal glucose: Secondary | ICD-10-CM

## 2013-10-25 DIAGNOSIS — E785 Hyperlipidemia, unspecified: Secondary | ICD-10-CM

## 2013-10-25 DIAGNOSIS — R7303 Prediabetes: Secondary | ICD-10-CM

## 2013-10-25 DIAGNOSIS — R7989 Other specified abnormal findings of blood chemistry: Secondary | ICD-10-CM

## 2013-10-25 DIAGNOSIS — E119 Type 2 diabetes mellitus without complications: Secondary | ICD-10-CM

## 2013-10-25 DIAGNOSIS — R945 Abnormal results of liver function studies: Secondary | ICD-10-CM

## 2013-10-25 LAB — HEMOGLOBIN A1C: HEMOGLOBIN A1C: 6.9 % — AB (ref 4.6–6.5)

## 2013-10-25 LAB — HEPATIC FUNCTION PANEL
ALBUMIN: 4 g/dL (ref 3.5–5.2)
ALK PHOS: 76 U/L (ref 39–117)
ALT: 29 U/L (ref 0–35)
AST: 24 U/L (ref 0–37)
Bilirubin, Direct: 0 mg/dL (ref 0.0–0.3)
Total Bilirubin: 0.6 mg/dL (ref 0.3–1.2)
Total Protein: 7.7 g/dL (ref 6.0–8.3)

## 2013-11-01 ENCOUNTER — Ambulatory Visit: Payer: Medicare Other | Admitting: Internal Medicine

## 2013-11-05 ENCOUNTER — Encounter: Payer: Self-pay | Admitting: Internal Medicine

## 2013-11-05 ENCOUNTER — Ambulatory Visit (INDEPENDENT_AMBULATORY_CARE_PROVIDER_SITE_OTHER): Payer: Managed Care, Other (non HMO) | Admitting: Internal Medicine

## 2013-11-05 VITALS — BP 134/76 | Temp 98.2°F | Ht 62.25 in | Wt 181.0 lb

## 2013-11-05 DIAGNOSIS — R6889 Other general symptoms and signs: Secondary | ICD-10-CM

## 2013-11-05 DIAGNOSIS — R7303 Prediabetes: Secondary | ICD-10-CM

## 2013-11-05 DIAGNOSIS — R7309 Other abnormal glucose: Secondary | ICD-10-CM

## 2013-11-05 DIAGNOSIS — Z6832 Body mass index (BMI) 32.0-32.9, adult: Secondary | ICD-10-CM

## 2013-11-05 DIAGNOSIS — R0989 Other specified symptoms and signs involving the circulatory and respiratory systems: Secondary | ICD-10-CM

## 2013-11-05 DIAGNOSIS — I10 Essential (primary) hypertension: Secondary | ICD-10-CM

## 2013-11-05 DIAGNOSIS — K219 Gastro-esophageal reflux disease without esophagitis: Secondary | ICD-10-CM

## 2013-11-05 DIAGNOSIS — Z23 Encounter for immunization: Secondary | ICD-10-CM

## 2013-11-05 DIAGNOSIS — E039 Hypothyroidism, unspecified: Secondary | ICD-10-CM

## 2013-11-05 MED ORDER — LEVOTHYROXINE SODIUM 75 MCG PO TABS: ORAL_TABLET | ORAL | Status: DC

## 2013-11-05 MED ORDER — PANTOPRAZOLE SODIUM 40 MG PO TBEC: 40.0000 mg | DELAYED_RELEASE_TABLET | Freq: Every day | ORAL | Status: DC

## 2013-11-05 NOTE — Progress Notes (Signed)
Chief Complaint  Patient presents with  . Follow-up    HPI: Patient comes in today for follow up of  multiple medical problems.   Prediabetes tryin gto eat better   bp was 119/73 at urologist .   LIPIDS no se of meds   GERD only using qd ppi  Some nights bad  Gets cough  ROS: See pertinent positives and negatives per HPI. No syncope sob eye or neuropathy changes   Past Medical History  Diagnosis Date  . Allergy   . GERD (gastroesophageal reflux disease)     hx of stricture  2005  . Hyperlipidemia   . Hypertension   . Nephrolithiasis   . Fasting hyperglycemia 2007    Family History  Problem Relation Age of Onset  . Cancer Son     liver  . Cancer Father     brain  . Diabetes Sister   . Schizophrenia Son     History   Social History  . Marital Status: Married    Spouse Name: N/A    Number of Children: N/A  . Years of Education: N/A   Social History Main Topics  . Smoking status: Never Smoker   . Smokeless tobacco: None  . Alcohol Use: No  . Drug Use: No  . Sexual Activity: None   Other Topics Concern  . None   Social History Narrative   Married   hh of 2   4 dogs   G2 P2   Water exercise.          Outpatient Encounter Prescriptions as of 11/05/2013  Medication Sig  . levothyroxine (SYNTHROID, LEVOTHROID) 75 MCG tablet TAKE 1 TABLET DAILY  . pantoprazole (PROTONIX) 40 MG tablet Take 1 tablet (40 mg total) by mouth daily.  [name] PREMARIN vaginal cream Use 2-3 times weekly  . triamterene-hydrochlorothiazide (MAXZIDE-25) 37.5-25 MG per tablet Take 1 each (1 tablet total) by mouth daily.  . [DISCONTINUED] levothyroxine (SYNTHROID, LEVOTHROID) 75 MCG tablet TAKE 1 TABLET DAILY  . [DISCONTINUED] pantoprazole (PROTONIX) 40 MG tablet Take 1 tablet (40 mg total) by mouth daily.  [name] atorvastatin (LIPITOR) 20 MG tablet Take 1 tablet (20 mg total) by mouth daily.  . metFORMIN (GLUCOPHAGE-XR) 500 MG 24 hr tablet Take 2 tablets (1,000 mg total) by mouth daily  with breakfast.  . [DISCONTINUED] ciprofloxacin (CIPRO) 500 MG tablet Take 1 tablet (500 mg total) by mouth 2 (two) times daily.    EXAM:  BP 134/76  Temp(Src) 98.2 F (36.8 C) (Oral)  Ht 5' 2.25" (1.581 m)  Wt 181 lb (82.101 kg)  BMI 32.85 kg/m2  Body mass index is 32.85 kg/(m^2).  GENERAL: vitals reviewed and listed above, alert, oriented, appears well hydrated and in no acute distress  PSYCH: pleasant and cooperative, no obvious depression or anxiety Lab Results  Component Value Date   WBC 9.7 05/03/2013   HGB 13.2 05/03/2013   HCT 39.2 05/03/2013   PLT 283.0 05/03/2013   GLUCOSE 128* 05/03/2013   CHOL 157 05/03/2013   TRIG 129.0 05/03/2013   HDL 38.70* 05/03/2013   LDLCALC 93 05/03/2013   ALT 29 10/25/2013   AST 24 10/25/2013   NA 146* 05/03/2013   K 4.3 05/03/2013   CL 108 05/03/2013   CREATININE 0.9 05/03/2013   BUN 21 05/03/2013   CO2 31 05/03/2013   TSH 2.14 05/03/2013   INR 1.12 11/19/2009   HGBA1C 6.9* 10/25/2013    ASSESSMENT AND PLAN:  Discussed the following assessment and  plan:  Pre-diabetes - a1c cw controlled dm follow up lsi fyu in 6 months at check up   Throat clearing - nocrturnal gerd taking once a day fu with dr Juanda Chancebrodie if stil problematic ir getting worse.  Esophageal reflux - nocturnal sx not totally controlled  weigh tloss advised   Unspecified essential hypertension - has been controlled   Unspecified hypothyroidism  Need for vaccination with 13-polyvalent pneumococcal conjugate vaccine - Plan: Pneumococcal conjugate vaccine 13-valent  BMI 32.0-32.9,adult prevnar 13  -Patient advised to return or notify health care team  if symptoms worsen or persist or new concerns arise.  Patient Instructions   Liver and sugar is stable.  Weight loss can help the reflux also . i f getting worse  See your GI doctor .) Wt Readings from Last 3 Encounters:  11/05/13 181 lb (82.101 kg)  09/02/13 181 lb (82.101 kg)  05/03/13 177 lb (80.287 kg)   Wellness visit in 6 months an  we will do full set of labs at that point    Total visit 25mins > 50% spent counseling and coordinating care   Disc vaccine etc  Burna MortimerWanda K. Panosh M.D.   Pre visit review using our clinic review tool, if applicable. No additional management support is needed unless otherwise documented below in the visit note.

## 2013-11-05 NOTE — Patient Instructions (Signed)
Liver and sugar is stable.  Weight loss can help the reflux also . i f getting worse  See your GI doctor .) Wt Readings from Last 3 Encounters:  11/05/13 181 lb (82.101 kg)  09/02/13 181 lb (82.101 kg)  05/03/13 177 lb (80.287 kg)   Wellness visit in 6 months an we will do full set of labs at that point

## 2013-11-06 ENCOUNTER — Telehealth: Payer: Self-pay | Admitting: Internal Medicine

## 2013-11-06 NOTE — Telephone Encounter (Signed)
Relevant patient education assigned to patient using Emmi. ° °

## 2013-11-11 ENCOUNTER — Other Ambulatory Visit: Payer: Self-pay | Admitting: Family Medicine

## 2013-11-11 MED ORDER — LEVOTHYROXINE SODIUM 75 MCG PO TABS: ORAL_TABLET | ORAL | Status: DC

## 2013-11-11 MED ORDER — PANTOPRAZOLE SODIUM 40 MG PO TBEC: 40.0000 mg | DELAYED_RELEASE_TABLET | Freq: Every day | ORAL | Status: DC

## 2013-12-17 ENCOUNTER — Telehealth: Payer: Self-pay | Admitting: Family Medicine

## 2013-12-17 MED ORDER — METFORMIN HCL ER 500 MG PO TB24: 1000.0000 mg | ORAL_TABLET | Freq: Every day | ORAL | Status: DC

## 2013-12-17 NOTE — Telephone Encounter (Signed)
Pt called and left a message on my machine stating she needs a refill of her Metformin (90 day supply) sent to [facility] in [place]. Sent by e-scribe.

## 2014-01-02 ENCOUNTER — Telehealth: Payer: Self-pay | Admitting: Family Medicine

## 2014-01-03 MED ORDER — ATORVASTATIN CALCIUM 20 MG PO TABS: 20.0000 mg | ORAL_TABLET | Freq: Every day | ORAL | Status: DC

## 2014-01-03 MED ORDER — TRIAMTERENE-HCTZ 37.5-25 MG PO TABS: 1.0000 | ORAL_TABLET | Freq: Every day | ORAL | Status: DC

## 2014-01-09 NOTE — Telephone Encounter (Signed)
Sent by e-scribe. 

## 2014-03-25 LAB — HM MAMMOGRAPHY

## 2014-04-02 ENCOUNTER — Encounter: Payer: Self-pay | Admitting: Internal Medicine

## 2014-05-06 ENCOUNTER — Encounter: Payer: Self-pay | Admitting: Internal Medicine

## 2014-05-06 ENCOUNTER — Ambulatory Visit (INDEPENDENT_AMBULATORY_CARE_PROVIDER_SITE_OTHER): Payer: Managed Care, Other (non HMO) | Admitting: Internal Medicine

## 2014-05-06 VITALS — BP 120/80 | Temp 98.1°F | Ht 62.25 in | Wt 184.0 lb

## 2014-05-06 DIAGNOSIS — Z8739 Personal history of other diseases of the musculoskeletal system and connective tissue: Secondary | ICD-10-CM

## 2014-05-06 DIAGNOSIS — R7309 Other abnormal glucose: Secondary | ICD-10-CM

## 2014-05-06 DIAGNOSIS — E782 Mixed hyperlipidemia: Secondary | ICD-10-CM

## 2014-05-06 DIAGNOSIS — E039 Hypothyroidism, unspecified: Secondary | ICD-10-CM

## 2014-05-06 DIAGNOSIS — K219 Gastro-esophageal reflux disease without esophagitis: Secondary | ICD-10-CM

## 2014-05-06 DIAGNOSIS — Z0000 Encounter for general adult medical examination without abnormal findings: Secondary | ICD-10-CM

## 2014-05-06 DIAGNOSIS — I10 Essential (primary) hypertension: Secondary | ICD-10-CM

## 2014-05-06 LAB — LIPID PANEL
CHOLESTEROL: 149 mg/dL (ref 0–200)
HDL: 39.2 mg/dL (ref >39.00)
LDL CALC: 85 mg/dL (ref 0–99)
NonHDL: 109.8
TRIGLYCERIDES: 125 mg/dL (ref 0.0–149.0)
Total CHOL/HDL Ratio: 4
VLDL: 25 mg/dL (ref 0.0–40.0)

## 2014-05-06 LAB — HEPATIC FUNCTION PANEL
ALBUMIN: 3.9 g/dL (ref 3.5–5.2)
ALT: 30 U/L (ref 0–35)
AST: 29 U/L (ref 0–37)
Alkaline Phosphatase: 75 U/L (ref 39–117)
BILIRUBIN TOTAL: 0.4 mg/dL (ref 0.2–1.2)
Bilirubin, Direct: 0 mg/dL (ref 0.0–0.3)
Total Protein: 7.6 g/dL (ref 6.0–8.3)

## 2014-05-06 LAB — BASIC METABOLIC PANEL
BUN: 25 mg/dL — AB (ref 6–23)
CHLORIDE: 104 meq/L (ref 96–112)
CO2: 28 meq/L (ref 19–32)
Calcium: 10 mg/dL (ref 8.4–10.5)
Creatinine, Ser: 1 mg/dL (ref 0.4–1.2)
GFR: 58.22 mL/min — ABNORMAL LOW (ref >60.00)
Glucose, Bld: 131 mg/dL — ABNORMAL HIGH (ref 70–99)
Potassium: 4.6 mEq/L (ref 3.5–5.1)
Sodium: 144 mEq/L (ref 135–145)

## 2014-05-06 LAB — CBC WITH DIFFERENTIAL/PLATELET
Basophils Absolute: 0 10*3/uL (ref 0.0–0.1)
Basophils Relative: 0.3 % (ref 0.0–3.0)
EOS ABS: 0.3 10*3/uL (ref 0.0–0.7)
Eosinophils Relative: 2.6 % (ref 0.0–5.0)
HEMATOCRIT: 40.2 % (ref 36.0–46.0)
Hemoglobin: 13.3 g/dL (ref 12.0–15.0)
LYMPHS ABS: 2.6 10*3/uL (ref 0.7–4.0)
Lymphocytes Relative: 26.2 % (ref 12.0–46.0)
MCHC: 33 g/dL (ref 30.0–36.0)
MCV: 89 fl (ref 78.0–100.0)
MONO ABS: 0.6 10*3/uL (ref 0.1–1.0)
Monocytes Relative: 5.9 % (ref 3.0–12.0)
Neutro Abs: 6.5 10*3/uL (ref 1.4–7.7)
Neutrophils Relative %: 65 % (ref 43.0–77.0)
PLATELETS: 282 10*3/uL (ref 150.0–400.0)
RBC: 4.52 Mil/uL (ref 3.87–5.11)
RDW: 14.4 % (ref 11.5–15.5)
WBC: 10 10*3/uL (ref 4.0–10.5)

## 2014-05-06 LAB — TSH: TSH: 2.14 u[IU]/mL (ref 0.35–4.50)

## 2014-05-06 LAB — HEMOGLOBIN A1C: Hgb A1c MFr Bld: 7.3 % — ABNORMAL HIGH (ref 4.6–6.5)

## 2014-05-06 MED ORDER — TRIAMTERENE-HCTZ 37.5-25 MG PO TABS: 1.0000 | ORAL_TABLET | Freq: Every day | ORAL | Status: DC

## 2014-05-06 MED ORDER — METFORMIN HCL ER 500 MG PO TB24: 1000.0000 mg | ORAL_TABLET | Freq: Every day | ORAL | Status: DC

## 2014-05-06 MED ORDER — LIDOCAINE 5 % EX PTCH: 1.0000 | MEDICATED_PATCH | CUTANEOUS | Status: AC

## 2014-05-06 NOTE — Patient Instructions (Addendum)
Continue lifestyle intervention healthy eating and exercise .   Healthy lifestyle includes : At least 150 minutes of exercise weeks  , weight at healthy levels, which is usually   BMI 19-25. Avoid trans fats and processed foods;  Increase fresh fruits and veges to 5 servings per day. And avoid sweet beverages including tea and juice. Mediterranean diet with olive oil and nuts have been noted to be heart and brain healthy . Avoid tobacco products . Limit  alcohol to  7 per week for women and 14 servings for men.  Get adequate sleep . Wear seat belts . Don't text and drive .  Will notify you  of labs when available. If all is well .  6 months  Labs pre visit  hgba1c

## 2014-05-06 NOTE — Progress Notes (Signed)
Pre visit review using our clinic review tool, if applicable. No additional management support is needed unless otherwise documented below in the visit note.  Chief Complaint  Patient presents with  . Medicare Wellness  . Hypertension  . Hypothyroidism    HPI: Patient comes in today for Preventive Medicare wellness visit . No major injuries, ed visits ,hospitalizations , new medications since last visit. Generally feels well . BP usually good  On meds  LIPIDS o natrova Thyroid no change in meds  No numbness edema  GERD no change taking med  BG :taking metformin no se reported  Monitoring  No eye or neuro sx  BACK: chiro ocass lidoderm patch helps a lot   Avoiding nsaids and pain meds  Paying out of pocket .   Health Maintenance  Topic Date Due  . Foot Exam  02/14/1956  . Ophthalmology Exam  02/14/1956  . Urine Microalbumin  02[birth year]14/1954  . Pneumococcal Polysaccharide Vaccine Age 80 And Over  02/14/2009  . Tetanus/tdap  08/29/2013  . Influenza Vaccine  03/29/2014  . Hemoglobin A1c  04/24/2014  . Colonoscopy  12/22/2014  . Mammogram  03/25/2016  . Zostavax  Completed   Health Maintenance Review LIFESTYLE:  Exercise:  Water aerobics 2 x per week walking gardening   Tobacco/ETS: no Alcohol: per day  no Sugar beverages: limited  Sleep:ok except for ocass nocturia  Drug use: no Bone density:  Colonoscopy: 4 2006?   Hearing: ok   Vision:  No limitations at present . Last eye check UTD ok   Safety:  Has smoke detector and wears seat belts.  No firearms. No excess sun exposure. Sees dentist regularly.  Falls: no   Advance directive :  adressed     Memory: Felt to be good  , no concern from her or her family.  Depression: No anhedonia unusual crying or depressive symptoms  Nutrition: Eats well balanced diet; adequate calcium and vitamin D. No swallowing chewing problems.  Injury: no major injuries in the last six months.  Other healthcare providers:  Reviewed today  .  Social:  Lives with spouse married.  4 dogs   Preventive parameters: up-to-date  Reviewed   ADLS:   There are no problems or need for assistance  driving, feeding, obtaining food, dressing, toileting and bathing, managing money using phone. She is independent.   ROS:  alittle tired .  GEN/ HEENT: No fever, significant weight changes sweats headaches vision problems hearing changes, CV/ PULM; No chest pain shortness of breath cough, syncope,edema  change in exercise tolerance. GI /GU: No adominal pain, vomiting, change in bowel habits. No blood in the stool. No significant GU symptoms. SKIN/HEME: ,no acute skin rashes suspicious lesions or bleeding. No lymphadenopathy, nodules, masses.  NEURO/ PSYCH:  No neurologic signs such as weakness numbness. No depression anxiety. IMM/ Allergy: No unusual infections.  Allergy .   REST of 12 system review negative except as per HPI   Past Medical History  Diagnosis Date  . Allergy   . GERD (gastroesophageal reflux disease)     hx of stricture  2005  . Hyperlipidemia   . Hypertension   . Nephrolithiasis   . Fasting hyperglycemia 2007    Family History  Problem Relation Age of Onset  . Cancer Son     liver  . Cancer Father     brain  . Diabetes Sister   . Schizophrenia Son     History   Social History  . Marital Status:  Married    Spouse Name: N/A    Number of Children: N/A  . Years of Education: N/A   Social History Main Topics  . Smoking status: Never Smoker   . Smokeless tobacco: None  . Alcohol Use: No  . Drug Use: No  . Sexual Activity: None   Other Topics Concern  . None   Social History Narrative   Married   hh of 2   4 dogs   G2 P2   Water exercise.   Retired    [employer]    great grandchild              Outpatient Encounter Prescriptions as of 05/06/2014  Medication Sig  . atorvastatin (LIPITOR) 20 MG tablet Take 1 tablet (20 mg total) by mouth daily.  [name] levothyroxine (SYNTHROID, LEVOTHROID) 75 MCG  tablet TAKE 1 TABLET DAILY  . metFORMIN (GLUCOPHAGE-XR) 500 MG 24 hr tablet Take 2 tablets (1,000 mg total) by mouth daily with breakfast.  . pantoprazole (PROTONIX) 40 MG tablet Take 1 tablet (40 mg total) by mouth daily.  [name] PREMARIN vaginal cream Use 2-3 times weekly  . triamterene-hydrochlorothiazide (MAXZIDE-25) 37.5-25 MG per tablet Take 1 tablet by mouth daily.  . [DISCONTINUED] metFORMIN (GLUCOPHAGE-XR) 500 MG 24 hr tablet Take 2 tablets (1,000 mg total) by mouth daily with breakfast.  . [DISCONTINUED] triamterene-hydrochlorothiazide (MAXZIDE-25) 37.5-25 MG per tablet Take 1 tablet by mouth daily.  [name] lidocaine (LIDODERM) 5 % Place 1 patch onto the skin daily. Remove & Discard patch within 12 hours or as directed by MD    EXAM:  BP 120/80  Temp(Src) 98.1 F (36.7 C) (Oral)  Ht 5' 2.25" (1.581 m)  Wt 184 lb (83.462 kg)  BMI 33.39 kg/m2  Body mass index is 33.39 kg/(m^2).  Physical Exam: Vital signs reviewed ONG:EXBM is a well-developed well-nourished alert cooperative   who appears stated age in no acute distress.  HEENT: normocephalic atraumatic , Eyes: PERRL EOM's full, conjunctiva clear, Nares: paten,t no deformity discharge or tenderness., Ears: no deformity EAC's clear TMs with normal landmarks. Mouth: clear OP, no lesions, edema.  Moist mucous membranes. Dentition in adequate repair. NECK: supple without masses, thyromegaly or bruits. CHEST/PULM:  Clear to auscultation and percussion breath sounds equal no wheeze , rales or rhonchi. No chest wall deformities or tenderness. CV: PMI is nondisplaced, S1 S2 no gallops, murmurs, rubs. Peripheral pulses are full without delay.No JVD .  Breast: normal by inspection . No dimpling, discharge, masses, tenderness or discharge . ABDOMEN: Bowel sounds normal nontender  No guard or rebound, no hepato splenomegal no CVA tenderness.  No hernia. Extremtities:  No clubbing cyanosis or edema, no acute joint swelling or redness no focal atrophy  feet no ulcers  lesions NEURO:  Oriented x3, cranial nerves 3-12 appear to be intact, no obvious focal weakness,gait within normal limits no abnormal reflexes or asymmetrical SKIN: No acute rashes normal turgor, color, no bruising or petechiae. Sun changes     PSYCH: Oriented, good eye contact, no obvious depression anxiety, cognition and judgment appear normal. LN: no cervical axillary inguinal adenopathy No noted deficits in memory, attention, and speech.   ASSESSMENT AND PLAN:  Discussed the following assessment and plan:  Encounter for preventive health examination - will get hihg dose fluzone when availble   HYPERLIPIDEMIA - check labs continue meds  - Plan: Basic metabolic panel, CBC with Differential, Hemoglobin A1c, Hepatic function panel, Lipid panel, TSH  HYPERGLYCEMIA, FASTING - in early diabetic range on  metformin no eye findings or neuropathy sx. - Plan: Basic metabolic panel, CBC with Differential, Hemoglobin A1c, Hepatic function panel, Lipid panel, TSH  Gastroesophageal reflux disease, esophagitis presence not specified - Plan: Basic metabolic panel, CBC with Differential, Hemoglobin A1c, Hepatic function panel, Lipid panel, TSH  HYPERTENSION - controlled on second read if sugars  in dm range consider acei  - Plan: Basic metabolic panel, CBC with Differential, Hemoglobin A1c, Hepatic function panel, Lipid panel, TSH  HYPOTHYROIDISM - monitoring check lab - Plan: Basic metabolic panel, CBC with Differential, Hemoglobin A1c, Hepatic function panel, Lipid panel, TSH  Hx of low back pain -  prn lidoderm at night ocass and very helpful ( paying out of ppocket )good for avoiding nsaid with its risks  Medicare annual wellness visit, subsequent  Patient Care Team: Madelin Headings, MD as PCP - General Valeria Batman, MD (Orthopedic Surgery) Claudie Revering, MD (Dermatology) Ernesto Rutherford, MD (Ophthalmology) Danae Chen, MD as Consulting Physician (Urology) Dr Leana Roe chiropractor   Patient Instructions  Continue lifestyle intervention healthy eating and exercise .   Healthy lifestyle includes : At least 150 minutes of exercise weeks  , weight at healthy levels, which is usually   BMI 19-25. Avoid trans fats and processed foods;  Increase fresh fruits and veges to 5 servings per day. And avoid sweet beverages including tea and juice. Mediterranean diet with olive oil and nuts have been noted to be heart and brain healthy . Avoid tobacco products . Limit  alcohol to  7 per week for women and 14 servings for men.  Get adequate sleep . Wear seat belts . Don't text and drive .  Will notify you  of labs when available. If all is well .  6 months  Labs pre visit  hgba1c      Neta Mends.  M.D.

## 2014-05-08 ENCOUNTER — Ambulatory Visit: Payer: Managed Care, Other (non HMO) | Admitting: Internal Medicine

## 2014-06-16 ENCOUNTER — Other Ambulatory Visit: Payer: Self-pay | Admitting: Family Medicine

## 2014-06-18 ENCOUNTER — Other Ambulatory Visit: Payer: Self-pay | Admitting: Family Medicine

## 2014-07-17 ENCOUNTER — Ambulatory Visit: Payer: Managed Care, Other (non HMO)

## 2014-07-17 ENCOUNTER — Ambulatory Visit (INDEPENDENT_AMBULATORY_CARE_PROVIDER_SITE_OTHER): Payer: Managed Care, Other (non HMO)

## 2014-07-17 DIAGNOSIS — Z23 Encounter for immunization: Secondary | ICD-10-CM

## 2014-08-20 ENCOUNTER — Telehealth: Payer: Self-pay | Admitting: Internal Medicine

## 2014-08-20 NOTE — Telephone Encounter (Signed)
[employer] called due to the fact pt has had some lapse /delays in her med atorvastatin (LIPITOR) 20 MG tablet.  Pt had filled 5/8  For 90 day and 9/21 for 90 days. They just wanted to bring to your attention

## 2014-08-20 NOTE — Telephone Encounter (Signed)
FYI

## 2014-08-21 NOTE — Telephone Encounter (Signed)
Noted  

## 2014-09-02 ENCOUNTER — Telehealth: Payer: Self-pay | Admitting: Internal Medicine

## 2014-09-02 NOTE — Telephone Encounter (Signed)
Use slot and have pt speak to CAN

## 2014-09-02 NOTE — Telephone Encounter (Signed)
Pt has had stomach issue about a month. Sometimes severe, sometimes not. nausea and chills. Pt states maybe uti, ?? Ok to see this week or next, only same day appts left. pls advise.

## 2014-09-02 NOTE — Telephone Encounter (Signed)
Please  Get more clinical information as to  [employer]  Can us 130 sda on Friday if shouldn't wait until next week.

## 2014-09-03 NOTE — Telephone Encounter (Signed)
Pt states this has been going on for over a month, gets better and worse. Friday is great. Refused triage at this time.

## 2014-09-05 ENCOUNTER — Encounter: Payer: Self-pay | Admitting: Internal Medicine

## 2014-09-05 ENCOUNTER — Ambulatory Visit (INDEPENDENT_AMBULATORY_CARE_PROVIDER_SITE_OTHER): Payer: No Typology Code available for payment source | Admitting: Internal Medicine

## 2014-09-05 VITALS — BP 144/88 | HR 107 | Temp 97.8°F | Ht 62.25 in | Wt 182.4 lb

## 2014-09-05 DIAGNOSIS — R1030 Lower abdominal pain, unspecified: Secondary | ICD-10-CM

## 2014-09-05 DIAGNOSIS — E782 Mixed hyperlipidemia: Secondary | ICD-10-CM

## 2014-09-05 DIAGNOSIS — Z87442 Personal history of urinary calculi: Secondary | ICD-10-CM

## 2014-09-05 DIAGNOSIS — R7303 Prediabetes: Secondary | ICD-10-CM

## 2014-09-05 DIAGNOSIS — M5441 Lumbago with sciatica, right side: Secondary | ICD-10-CM

## 2014-09-05 DIAGNOSIS — M5442 Lumbago with sciatica, left side: Secondary | ICD-10-CM

## 2014-09-05 DIAGNOSIS — R7309 Other abnormal glucose: Secondary | ICD-10-CM

## 2014-09-05 LAB — CBC WITH DIFFERENTIAL/PLATELET
BASOS PCT: 0.4 % (ref 0.0–3.0)
Basophils Absolute: 0 10*3/uL (ref 0.0–0.1)
Eosinophils Absolute: 0.3 10*3/uL (ref 0.0–0.7)
Eosinophils Relative: 2.6 % (ref 0.0–5.0)
HCT: 40.5 % (ref 36.0–46.0)
HEMOGLOBIN: 13.1 g/dL (ref 12.0–15.0)
Lymphocytes Relative: 26.1 % (ref 12.0–46.0)
Lymphs Abs: 3.2 10*3/uL (ref 0.7–4.0)
MCHC: 32.2 g/dL (ref 30.0–36.0)
MCV: 89.1 fl (ref 78.0–100.0)
MONOS PCT: 5.8 % (ref 3.0–12.0)
Monocytes Absolute: 0.7 10*3/uL (ref 0.1–1.0)
Neutro Abs: 8.1 10*3/uL — ABNORMAL HIGH (ref 1.4–7.7)
Neutrophils Relative %: 65.1 % (ref 43.0–77.0)
Platelets: 378 10*3/uL (ref 150.0–400.0)
RBC: 4.55 Mil/uL (ref 3.87–5.11)
RDW: 14.1 % (ref 11.5–15.5)
WBC: 12.4 10*3/uL — AB (ref 4.0–10.5)

## 2014-09-05 LAB — POCT URINALYSIS DIPSTICK
Bilirubin, UA: NEGATIVE
Blood, UA: NEGATIVE
Glucose, UA: NEGATIVE
KETONES UA: NEGATIVE
LEUKOCYTES UA: NEGATIVE
Nitrite, UA: NEGATIVE
Protein, UA: NEGATIVE
UROBILINOGEN UA: NEGATIVE
pH, UA: 6

## 2014-09-05 LAB — BASIC METABOLIC PANEL
BUN: 24 mg/dL — AB (ref 6–23)
CALCIUM: 10.5 mg/dL (ref 8.4–10.5)
CO2: 32 mEq/L (ref 19–32)
Chloride: 107 mEq/L (ref 96–112)
Creatinine, Ser: 1.3 mg/dL — ABNORMAL HIGH (ref 0.4–1.2)
GFR: 44.96 mL/min — ABNORMAL LOW (ref >60.00)
Glucose, Bld: 161 mg/dL — ABNORMAL HIGH (ref 70–99)
POTASSIUM: 5.4 meq/L — AB (ref 3.5–5.1)
Sodium: 150 mEq/L — ABNORMAL HIGH (ref 135–145)

## 2014-09-05 LAB — HEPATIC FUNCTION PANEL
ALBUMIN: 3.9 g/dL (ref 3.5–5.2)
ALK PHOS: 80 U/L (ref 39–117)
ALT: 35 U/L (ref 0–35)
AST: 39 U/L — ABNORMAL HIGH (ref 0–37)
Bilirubin, Direct: 0 mg/dL (ref 0.0–0.3)
TOTAL PROTEIN: 7.7 g/dL (ref 6.0–8.3)
Total Bilirubin: 0.4 mg/dL (ref 0.2–1.2)

## 2014-09-05 LAB — HEMOGLOBIN A1C: Hgb A1c MFr Bld: 7.4 % — ABNORMAL HIGH (ref 4.6–6.5)

## 2014-09-05 MED ORDER — ATORVASTATIN CALCIUM 20 MG PO TABS: 20.0000 mg | ORAL_TABLET | Freq: Every day | ORAL | Status: DC

## 2014-09-05 NOTE — Patient Instructions (Addendum)
Uncertain cause of your pain  Urine is clear today. Will notify you  of labs when available. Today and then ct scan of abd . If no cause and persistent we may plan  to get dr Juanda ChanceBrodie to see you.  Will do referral to you chiro for your acute back pain sciatica since it  Seems to help you.  Stop the metformin before and  Just after the scan  As per protochol

## 2014-09-05 NOTE — Progress Notes (Signed)
Pre visit review using our clinic review tool, if applicable. No additional management support is needed unless otherwise documented below in the visit note.   Chief Complaint  Patient presents with  . Abdominal Pain    pt c/o severe, cutting intermittent lower abdominal pain x 1 month. She ?? if she may have a UTI but denies any dysuria, odor, cloudy urine, or fever.   . Sciatica    HPI: Kerry Walters 69 y.o.   comesin for acute visit today  For above .  Side pain after eating then rlq and then lower abd pain  Bad gas  X no better and   husband Told her to come in. And had chills  Pain now was pelvic and severe not bad for the last 5-7 days but still not right   Eating careful  Feels sick at  Times  And off and on nausea.   No blood sugar .check    hx of kidney stones  ROS: See pertinent positives and negatives per HPI. After init diarrhea no change in habits and no blood . Last colon dr Juanda Chance ? When due no hx diverticulitis   lbp and sciatica that is helped by chiro but not needs referral .  Dr spell  Needs refill lipitor ? About sugar issues with this    Past Medical History  Diagnosis Date  . Allergy   . GERD (gastroesophageal reflux disease)     hx of stricture  2005  . Hyperlipidemia   . Hypertension   . Nephrolithiasis   . Fasting hyperglycemia 2007    Family History  Problem Relation Age of Onset  . Cancer Son     liver  . Cancer Father     brain  . Diabetes Sister   . Schizophrenia Son     History   Social History  . Marital Status: Married    Spouse Name: N/A    Number of Children: N/A  . Years of Education: N/A   Social History Main Topics  . Smoking status: Never Smoker   . Smokeless tobacco: None  . Alcohol Use: No  . Drug Use: No  . Sexual Activity: None   Other Topics Concern  . None   Social History Narrative   Married   hh of 2   4 dogs   G2 P2   Water exercise.   Retired    [employer]    great grandchild               Outpatient Encounter Prescriptions as of 09/05/2014  Medication Sig  . atorvastatin (LIPITOR) 20 MG tablet Take 1 tablet (20 mg total) by mouth daily.  [name] levothyroxine (SYNTHROID, LEVOTHROID) 75 MCG tablet TAKE 1 TABLET DAILY  . metFORMIN (GLUCOPHAGE-XR) 500 MG 24 hr tablet Take 2 tablets (1,000 mg total) by mouth daily with breakfast.  . pantoprazole (PROTONIX) 40 MG tablet Take 1 tablet (40 mg total) by mouth daily.  [name] PREMARIN vaginal cream Use 2-3 times weekly  . triamterene-hydrochlorothiazide (MAXZIDE-25) 37.5-25 MG per tablet Take 1 tablet by mouth daily.  . [DISCONTINUED] atorvastatin (LIPITOR) 20 MG tablet Take 1 tablet (20 mg total) by mouth daily.    EXAM:  BP 144/88 mmHg  Pulse 107  Temp(Src) 97.8 F (36.6 C) (Oral)  Ht 5' 2.25" (1.581 m)  Wt 182 lb 6.4 oz (82.736 kg)  BMI 33.10 kg/m2  Body mass index is 33.1 kg/(m^2).  GENERAL: vitals reviewed and listed above, alert, oriented,  appears well hydrated and in no acute distress HEENT: atraumatic, conjunctiva  clear, no obvious abnormalities on inspection of external nose and ears OP : no lesion edema or exudate  NECK: no obvious masses on inspection palpation  LUNGS: clear to auscultation bilaterally, no wheezes, rales or rhonchi, good air movement CV: HRRR, no clubbing cyanosis or  peripheral edema nl cap refill  Abdomen:  Sof,t normal bowel sounds without hepatosplenomegaly, no guarding rebound or masses no CVA tenderness Mild tenderness suprapubic area no mass noted  MS: moves all extremities without noticeable focal  abnormality PSYCH: pleasant and cooperative, no obvious depression or anxiety   ASSESSMENT AND PLAN:  Discussed the following assessment and plan:  Lower abdominal pain - Plan: CBC with Differential, Basic metabolic panel, Hemoglobin A1c, Hepatic function panel, CT Abdomen Pelvis W Contrast, POCT Urine Dip  Pre-diabetes - Plan: CBC with Differential, Basic metabolic panel, Hemoglobin A1c, Hepatic  function panel, CT Abdomen Pelvis W Contrast  Bilateral low back pain with sciatica, sciatica laterality unspecified - Plan: Ambulatory referral to Chiropractic  NEPHROLITHIASIS, HX OF - Plan: CT Abdomen Pelvis W Contrast  HYPERLIPIDEMIA - benefot more than risk   continue at this time No ovaries no appendix . Has GB hx of renal stones  ? If could have atypical diverticulitis other  Labs and ct scan.  -Patient advised to return or notify health care team  if symptoms worsen ,persist or new concerns arise.  Patient Instructions  Uncertain cause of your pain  Urine is clear today. Will notify you  of labs when available. Today and then ct scan of abd . If no cause and persistent we may plan  to get dr Juanda ChanceBrodie to see you.  Will do referral to you chiro for your acute back pain sciatica since it  Seems to help you.  Stop the metformin before and  Just after the scan  As per protochol   Wanda K. Panosh M.D.

## 2014-09-10 ENCOUNTER — Telehealth: Payer: Self-pay

## 2014-09-10 NOTE — Telephone Encounter (Signed)
Kerry Walters from CT called and states pt called her and told her back in the 80's she had a reaction to IVP.  Pt told Kerry Walters it caused her to have an asthma attack; pt denied anaphylactic shock or serious reactions.  Pt told Kerry Walters she will drink the oral contrast but her urologist told her to never have the IVP dye again.  Kerry Walters would like to know exactly what Dr. Fabian SharpPanosh is looking for with the scan?  The only two options now are to medicate the patient before the procedure or do the procedure with the oral contrast only.  Pls advise.

## 2014-09-10 NOTE — Telephone Encounter (Signed)
Per Dr. Fabian SharpPanosh do the oral contrast only; looking for possible diverituculitis, gallbladder or renal stones.  Called and spoke with [name] and she is aware and will call the patient.

## 2014-09-11 ENCOUNTER — Ambulatory Visit (INDEPENDENT_AMBULATORY_CARE_PROVIDER_SITE_OTHER)
Admission: RE | Admit: 2014-09-11 | Discharge: 2014-09-11 | Disposition: A | Discharge status: Home / Self Care | Payer: No Typology Code available for payment source | Source: Ambulatory Visit | Attending: Internal Medicine | Admitting: Internal Medicine

## 2014-09-11 DIAGNOSIS — R1030 Lower abdominal pain, unspecified: Secondary | ICD-10-CM

## 2014-09-11 DIAGNOSIS — Z87442 Personal history of urinary calculi: Secondary | ICD-10-CM

## 2014-09-11 DIAGNOSIS — R7309 Other abnormal glucose: Secondary | ICD-10-CM

## 2014-09-11 DIAGNOSIS — R7303 Prediabetes: Secondary | ICD-10-CM

## 2014-09-22 ENCOUNTER — Telehealth: Payer: Self-pay | Admitting: Internal Medicine

## 2014-09-22 NOTE — Telephone Encounter (Signed)
Pt would like a cb for results of labs and ct scan done last week. Pls call.

## 2014-09-23 NOTE — Telephone Encounter (Signed)
Pt notified of results by telephone.  See result note. 

## 2014-10-27 ENCOUNTER — Other Ambulatory Visit: Payer: Self-pay | Admitting: Urology

## 2014-10-29 ENCOUNTER — Encounter: Payer: Self-pay | Admitting: Internal Medicine

## 2014-11-05 ENCOUNTER — Other Ambulatory Visit (INDEPENDENT_AMBULATORY_CARE_PROVIDER_SITE_OTHER): Payer: Medicare Other

## 2014-11-05 DIAGNOSIS — R7303 Prediabetes: Secondary | ICD-10-CM

## 2014-11-05 DIAGNOSIS — R7309 Other abnormal glucose: Secondary | ICD-10-CM

## 2014-11-05 DIAGNOSIS — I10 Essential (primary) hypertension: Secondary | ICD-10-CM

## 2014-11-05 LAB — BASIC METABOLIC PANEL
BUN: 16 mg/dL (ref 6–23)
CO2: 31 mEq/L (ref 19–32)
Calcium: 9.6 mg/dL (ref 8.4–10.5)
Chloride: 105 mEq/L (ref 96–112)
Creatinine, Ser: 0.9 mg/dL (ref 0.40–1.20)
GFR: 65.65 mL/min (ref >60.00)
Glucose, Bld: 157 mg/dL — ABNORMAL HIGH (ref 70–99)
Potassium: 3.8 mEq/L (ref 3.5–5.1)
Sodium: 142 mEq/L (ref 135–145)

## 2014-11-05 LAB — HEMOGLOBIN A1C: Hgb A1c MFr Bld: 7.4 % — ABNORMAL HIGH (ref 4.6–6.5)

## 2014-11-06 ENCOUNTER — Encounter (HOSPITAL_BASED_OUTPATIENT_CLINIC_OR_DEPARTMENT_OTHER): Payer: Self-pay | Admitting: *Deleted

## 2014-11-07 ENCOUNTER — Encounter (HOSPITAL_BASED_OUTPATIENT_CLINIC_OR_DEPARTMENT_OTHER): Payer: Self-pay | Admitting: *Deleted

## 2014-11-07 NOTE — Progress Notes (Signed)
NPO AFTER MN. ARRIVE AT 0715. NEEDS ISTAT AND EKG. WILL TAKE SYNTHROID AM DOS W/ SIPS OF WATER.

## 2014-11-11 ENCOUNTER — Ambulatory Visit (HOSPITAL_BASED_OUTPATIENT_CLINIC_OR_DEPARTMENT_OTHER): Payer: Medicare Other | Admitting: Anesthesiology

## 2014-11-11 ENCOUNTER — Encounter (HOSPITAL_BASED_OUTPATIENT_CLINIC_OR_DEPARTMENT_OTHER): Payer: Self-pay | Admitting: Anesthesiology

## 2014-11-11 ENCOUNTER — Encounter (HOSPITAL_BASED_OUTPATIENT_CLINIC_OR_DEPARTMENT_OTHER)
Admission: RE | Disposition: A | Discharge status: Home / Self Care | Payer: Self-pay | Source: Ambulatory Visit | Attending: Urology

## 2014-11-11 ENCOUNTER — Ambulatory Visit (HOSPITAL_BASED_OUTPATIENT_CLINIC_OR_DEPARTMENT_OTHER)
Admission: RE | Admit: 2014-11-11 | Discharge: 2014-11-11 | Disposition: A | Discharge status: Home / Self Care | Payer: Medicare Other | Source: Ambulatory Visit | Attending: Urology | Admitting: Urology

## 2014-11-11 DIAGNOSIS — I4510 Unspecified right bundle-branch block: Secondary | ICD-10-CM | POA: Diagnosis not present

## 2014-11-11 DIAGNOSIS — E780 Pure hypercholesterolemia: Secondary | ICD-10-CM | POA: Insufficient documentation

## 2014-11-11 DIAGNOSIS — E039 Hypothyroidism, unspecified: Secondary | ICD-10-CM | POA: Insufficient documentation

## 2014-11-11 DIAGNOSIS — J45909 Unspecified asthma, uncomplicated: Secondary | ICD-10-CM | POA: Insufficient documentation

## 2014-11-11 DIAGNOSIS — Z9049 Acquired absence of other specified parts of digestive tract: Secondary | ICD-10-CM | POA: Insufficient documentation

## 2014-11-11 DIAGNOSIS — Z9851 Tubal ligation status: Secondary | ICD-10-CM | POA: Insufficient documentation

## 2014-11-11 DIAGNOSIS — Z96659 Presence of unspecified artificial knee joint: Secondary | ICD-10-CM | POA: Insufficient documentation

## 2014-11-11 DIAGNOSIS — Z886 Allergy status to analgesic agent status: Secondary | ICD-10-CM | POA: Diagnosis not present

## 2014-11-11 DIAGNOSIS — E669 Obesity, unspecified: Secondary | ICD-10-CM | POA: Insufficient documentation

## 2014-11-11 DIAGNOSIS — K219 Gastro-esophageal reflux disease without esophagitis: Secondary | ICD-10-CM | POA: Insufficient documentation

## 2014-11-11 DIAGNOSIS — N200 Calculus of kidney: Secondary | ICD-10-CM | POA: Insufficient documentation

## 2014-11-11 DIAGNOSIS — Z6832 Body mass index (BMI) 32.0-32.9, adult: Secondary | ICD-10-CM | POA: Diagnosis not present

## 2014-11-11 DIAGNOSIS — Z91041 Radiographic dye allergy status: Secondary | ICD-10-CM | POA: Diagnosis not present

## 2014-11-11 DIAGNOSIS — Z90710 Acquired absence of both cervix and uterus: Secondary | ICD-10-CM | POA: Diagnosis not present

## 2014-11-11 DIAGNOSIS — I10 Essential (primary) hypertension: Secondary | ICD-10-CM | POA: Diagnosis not present

## 2014-11-11 HISTORY — DX: Diverticulosis of large intestine without perforation or abscess without bleeding: K57.30

## 2014-11-11 HISTORY — DX: Personal history of urinary (tract) infections: Z87.440

## 2014-11-11 HISTORY — DX: Personal history of other endocrine, nutritional and metabolic disease: Z86.39

## 2014-11-11 HISTORY — DX: Other specified postprocedural states: Z98.890

## 2014-11-11 HISTORY — DX: Personal history of urinary calculi: Z87.442

## 2014-11-11 HISTORY — DX: Personal history of other diseases of the digestive system: Z87.19

## 2014-11-11 HISTORY — PX: CYSTOSCOPY WITH URETEROSCOPY AND STENT PLACEMENT: SHX6377

## 2014-11-11 HISTORY — DX: Unspecified right bundle-branch block: I45.10

## 2014-11-11 HISTORY — DX: Other specified postprocedural states: R11.2

## 2014-11-11 HISTORY — DX: Prediabetes: R73.03

## 2014-11-11 HISTORY — DX: Hypothyroidism, unspecified: E03.9

## 2014-11-11 HISTORY — DX: Nocturia: R35.1

## 2014-11-11 HISTORY — DX: Unspecified asthma, uncomplicated: J45.909

## 2014-11-11 HISTORY — DX: Sciatica, right side: M54.31

## 2014-11-11 LAB — POCT HEMOGLOBIN-HEMACUE: HEMOGLOBIN: 14 g/dL (ref 12.0–15.0)

## 2014-11-11 LAB — GLUCOSE, CAPILLARY
GLUCOSE-CAPILLARY: 165 mg/dL — AB (ref 70–99)
Glucose-Capillary: 136 mg/dL — ABNORMAL HIGH (ref 70–99)

## 2014-11-11 SURGERY — CYSTOURETEROSCOPY, WITH STENT INSERTION
Anesthesia: General | Site: Ureter | Laterality: Right

## 2014-11-11 MED ORDER — ACETAMINOPHEN 10 MG/ML IV SOLN
INTRAVENOUS | Status: DC | PRN
  Administered 2014-11-11: 1000 mg via INTRAVENOUS

## 2014-11-11 MED ORDER — TROSPIUM CHLORIDE ER 60 MG PO CP24: 60.0000 mg | ORAL_CAPSULE | Freq: Every day | ORAL | Status: DC

## 2014-11-11 MED ORDER — LIDOCAINE HCL (CARDIAC) 20 MG/ML IV SOLN
INTRAVENOUS | Status: DC | PRN
  Administered 2014-11-11: 80 mg via INTRAVENOUS

## 2014-11-11 MED ORDER — PROMETHAZINE HCL 25 MG/ML IJ SOLN
6.2500 mg | INTRAMUSCULAR | Status: DC | PRN
  Filled 2014-11-11: qty 1

## 2014-11-11 MED ORDER — DEXAMETHASONE SODIUM PHOSPHATE 10 MG/ML IJ SOLN
INTRAMUSCULAR | Status: DC | PRN
  Administered 2014-11-11: 10 mg via INTRAVENOUS

## 2014-11-11 MED ORDER — PHENAZOPYRIDINE HCL 200 MG PO TABS
200.0000 mg | ORAL_TABLET | Freq: Three times a day (TID) | ORAL | Status: DC
  Administered 2014-11-11: 200 mg via ORAL
  Filled 2014-11-11: qty 1

## 2014-11-11 MED ORDER — ONDANSETRON HCL 4 MG/2ML IJ SOLN
INTRAMUSCULAR | Status: DC | PRN
  Administered 2014-11-11: 4 mg via INTRAVENOUS

## 2014-11-11 MED ORDER — SODIUM CHLORIDE 0.9 % IR SOLN
Status: DC | PRN
  Administered 2014-11-11: 4000 mL

## 2014-11-11 MED ORDER — LACTATED RINGERS IV SOLN
INTRAVENOUS | Status: DC
  Administered 2014-11-11: 08:00:00 via INTRAVENOUS
  Filled 2014-11-11: qty 1000

## 2014-11-11 MED ORDER — EPHEDRINE SULFATE 50 MG/ML IJ SOLN
INTRAMUSCULAR | Status: DC | PRN
  Administered 2014-11-11 (×2): 12.5 mg via INTRAVENOUS

## 2014-11-11 MED ORDER — PHENAZOPYRIDINE HCL 200 MG PO TABS: 200.0000 mg | ORAL_TABLET | Freq: Three times a day (TID) | ORAL | Status: DC | PRN

## 2014-11-11 MED ORDER — IOHEXOL 350 MG/ML SOLN
INTRAVENOUS | Status: DC | PRN
Start: 2014-11-11 — End: 2014-11-11
  Administered 2014-11-11: 45 mL

## 2014-11-11 MED ORDER — SUCCINYLCHOLINE CHLORIDE 20 MG/ML IJ SOLN
INTRAMUSCULAR | Status: DC | PRN
  Administered 2014-11-11: 100 mg via INTRAVENOUS

## 2014-11-11 MED ORDER — FENTANYL CITRATE 0.05 MG/ML IJ SOLN
INTRAMUSCULAR | Status: DC | PRN
  Administered 2014-11-11: 100 ug via INTRAVENOUS

## 2014-11-11 MED ORDER — BELLADONNA ALKALOIDS-OPIUM 16.2-60 MG RE SUPP
RECTAL | Status: DC | PRN
  Administered 2014-11-11: 1 via RECTAL

## 2014-11-11 MED ORDER — CIPROFLOXACIN IN D5W 400 MG/200ML IV SOLN
INTRAVENOUS | Status: AC
  Filled 2014-11-11: qty 200

## 2014-11-11 MED ORDER — KETOROLAC TROMETHAMINE 30 MG/ML IJ SOLN
INTRAMUSCULAR | Status: DC | PRN
  Administered 2014-11-11: 30 mg via INTRAVENOUS

## 2014-11-11 MED ORDER — PHENYLEPHRINE HCL 10 MG/ML IJ SOLN
INTRAMUSCULAR | Status: DC | PRN
  Administered 2014-11-11: 120 ug via INTRAVENOUS
  Administered 2014-11-11: 80 ug via INTRAVENOUS

## 2014-11-11 MED ORDER — TRAMADOL HCL 50 MG PO TABS
50.0000 mg | ORAL_TABLET | Freq: Four times a day (QID) | ORAL | Status: DC | PRN
  Administered 2014-11-11: 50 mg via ORAL
  Filled 2014-11-11: qty 1

## 2014-11-11 MED ORDER — MIDAZOLAM HCL 5 MG/5ML IJ SOLN
INTRAMUSCULAR | Status: DC | PRN
  Administered 2014-11-11: 2 mg via INTRAVENOUS

## 2014-11-11 MED ORDER — PROPOFOL INFUSION 10 MG/ML OPTIME
INTRAVENOUS | Status: DC | PRN
  Administered 2014-11-11: 180 mL via INTRAVENOUS

## 2014-11-11 MED ORDER — TRAMADOL HCL 50 MG PO TABS: 50.0000 mg | ORAL_TABLET | Freq: Four times a day (QID) | ORAL | Status: DC | PRN

## 2014-11-11 MED ORDER — BELLADONNA ALKALOIDS-OPIUM 16.2-60 MG RE SUPP
RECTAL | Status: AC
  Filled 2014-11-11: qty 1

## 2014-11-11 MED ORDER — KETOROLAC TROMETHAMINE 30 MG/ML IJ SOLN
30.0000 mg | Freq: Once | INTRAMUSCULAR | Status: DC | PRN
  Filled 2014-11-11: qty 1

## 2014-11-11 MED ORDER — MENTHOL 3 MG MT LOZG
LOZENGE | OROMUCOSAL | Status: AC
  Filled 2014-11-11: qty 9

## 2014-11-11 MED ORDER — PHENAZOPYRIDINE HCL 100 MG PO TABS
ORAL_TABLET | ORAL | Status: AC
  Filled 2014-11-11: qty 2

## 2014-11-11 MED ORDER — TRAMADOL HCL 50 MG PO TABS
ORAL_TABLET | ORAL | Status: AC
  Filled 2014-11-11: qty 1

## 2014-11-11 MED ORDER — FENTANYL CITRATE 0.05 MG/ML IJ SOLN
INTRAMUSCULAR | Status: AC
  Filled 2014-11-11: qty 4

## 2014-11-11 MED ORDER — CIPROFLOXACIN IN D5W 400 MG/200ML IV SOLN
400.0000 mg | INTRAVENOUS | Status: AC
  Administered 2014-11-11: 400 mg via INTRAVENOUS
  Filled 2014-11-11: qty 200

## 2014-11-11 MED ORDER — LACTATED RINGERS IV SOLN
INTRAVENOUS | Status: DC | PRN
  Administered 2014-11-11 (×2): via INTRAVENOUS

## 2014-11-11 MED ORDER — FENTANYL CITRATE 0.05 MG/ML IJ SOLN
25.0000 ug | INTRAMUSCULAR | Status: DC | PRN
  Filled 2014-11-11: qty 1

## 2014-11-11 MED ORDER — MIDAZOLAM HCL 2 MG/2ML IJ SOLN
INTRAMUSCULAR | Status: AC
  Filled 2014-11-11: qty 2

## 2014-11-11 SURGICAL SUPPLY — 41 items
ADAPTER CATH URET PLST 4-6FR (CATHETERS) IMPLANT
BAG DRAIN URO-CYSTO SKYTR STRL (DRAIN) ×4 IMPLANT
BASKET LASER NITINOL 1.9FR (BASKET) IMPLANT
BASKET STNLS GEMINI 4WIRE 3FR (BASKET) IMPLANT
BASKET STONE 1.7 NGAGE (UROLOGICAL SUPPLIES) ×4 IMPLANT
BASKET STONE NCOMPASS (UROLOGICAL SUPPLIES) ×4 IMPLANT
BASKET ZERO TIP NITINOL 2.4FR (BASKET) IMPLANT
CANISTER SUCT LVC 12 LTR MEDI- (MISCELLANEOUS) ×4 IMPLANT
CATH INTERMIT  6FR 70CM (CATHETERS) IMPLANT
CATH URET 5FR 28IN CONE TIP (BALLOONS)
CATH URET 5FR 28IN OPEN ENDED (CATHETERS) ×4 IMPLANT
CATH URET 5FR 70CM CONE TIP (BALLOONS) IMPLANT
CATH URET DUAL LUMEN 6-10FR 50 (CATHETERS) IMPLANT
CLOTH BEACON ORANGE TIMEOUT ST (SAFETY) ×4 IMPLANT
DRSG TEGADERM 2-3/8X2-3/4 SM (GAUZE/BANDAGES/DRESSINGS) IMPLANT
FIBER LASER FLEXIVA 200 (UROLOGICAL SUPPLIES) IMPLANT
FIBER LASER FLEXIVA 365 (UROLOGICAL SUPPLIES) IMPLANT
GLOVE BIO SURGEON STRL SZ 6.5 (GLOVE) ×6 IMPLANT
GLOVE BIO SURGEON STRL SZ7 (GLOVE) ×4 IMPLANT
GLOVE BIO SURGEON STRL SZ7.5 (GLOVE) ×4 IMPLANT
GLOVE BIO SURGEONS STRL SZ 6.5 (GLOVE) ×2
GLOVE INDICATOR 6.5 STRL GRN (GLOVE) ×12 IMPLANT
GLOVE INDICATOR 7.5 STRL GRN (GLOVE) ×4 IMPLANT
GLOVE SURG SS PI 7.5 STRL IVOR (GLOVE) ×4 IMPLANT
GOWN STRL REUS W/ TWL LRG LVL3 (GOWN DISPOSABLE) ×2 IMPLANT
GOWN STRL REUS W/ TWL XL LVL3 (GOWN DISPOSABLE) ×4 IMPLANT
GOWN STRL REUS W/TWL LRG LVL3 (GOWN DISPOSABLE) ×2
GOWN STRL REUS W/TWL XL LVL3 (GOWN DISPOSABLE) ×8 IMPLANT
GUIDEWIRE 0.038 PTFE COATED (WIRE) IMPLANT
GUIDEWIRE ANG ZIPWIRE 038X150 (WIRE) IMPLANT
GUIDEWIRE STR DUAL SENSOR (WIRE) ×12 IMPLANT
IV NS IRRIG 3000ML ARTHROMATIC (IV SOLUTION) ×4 IMPLANT
KIT BALLIN UROMAX 15FX10 (LABEL) IMPLANT
KIT BALLN UROMAX 15FX4 (MISCELLANEOUS) IMPLANT
KIT BALLN UROMAX 26 75X4 (MISCELLANEOUS)
NS IRRIG 500ML POUR BTL (IV SOLUTION) ×4 IMPLANT
PACK CYSTO (CUSTOM PROCEDURE TRAY) ×4 IMPLANT
SET HIGH PRES BAL DIL (LABEL)
SHEATH ACCESS URETERAL 38CM (SHEATH) ×4 IMPLANT
STENT URET 6FRX24 CONTOUR (STENTS) ×4 IMPLANT
TUBE FEEDING 8FR 16IN STR KANG (MISCELLANEOUS) IMPLANT

## 2014-11-11 NOTE — Transfer of Care (Signed)
Immediate Anesthesia Transfer of Care Note  Patient: Kerry GrovesMary Sue Eblin  Procedure(s) Performed: Procedure(s): CYSTOSCOPY WITH URETEROSCOPY AND STENT PLACEMENT (Right)  Patient Location: PACU  Anesthesia Type:General  Level of Consciousness: awake, alert , oriented and patient cooperative  Airway & Oxygen Therapy: Patient Spontanous Breathing and Patient connected to nasal cannula oxygen  Post-op Assessment: Report given to RN and Post -op Vital signs reviewed and stable  Post vital signs: Reviewed and stable  Last Vitals:  Filed Vitals:   11/11/14 0731  BP: 160/71  Pulse: 81  Temp: 36.6 C  Resp: 16    Complications: No apparent anesthesia complications

## 2014-11-11 NOTE — H&P (Signed)
Reason For Visit discussion of stone surgery   History of Present Illness 89F with a long history of kidney stones who has been treated for years by Dr. Brunilda PayorNesi. 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 We discussed ureteroscopy over the phone, the patient presents today for further discussion.  The patient states that she has ongoing right flank pain, she is eager to get her surgery behind her.   The patient tells me that she has a history of a ureterolithotomy in the 1980s. She doesn't have any more details than this.   Past Medical History Problems  1. History of Asthma (J45.909) 2. History of heartburn (Z87.898) 3. History of hypercholesterolemia (Z86.39) 4. History of hypertension (Z86.79)  Surgical History Problems  1. History of Appendectomy 2. History of Excision Of Urethral Caruncle 3. History of Foot Surgery 4. History of Hysterectomy 5. History of Knee Replacement 6. History of Lithotripsy  Current Meds 1. Lipitor 20 MG Oral Tablet;  Therapy: (Recorded:19Jan2015) to Recorded 2. NexIUM 40 MG Oral Capsule Delayed Release;  Therapy: (Recorded:20Nov2008) to Recorded 3. Premarin 0.625 MG/GM Vaginal Cream; APPLY A PEA-SIZED AMOUNT TO VAGINAL  OPENING 3 TO 4 TIMES PER WEEK;  Therapy: 12Feb2015 to (Last Rx:19Feb2016)  Requested for: 19Feb2016 Ordered 4. Synthroid TABS;  Therapy: (Recorded:20Nov2008) to Recorded 5. Triamterene-HCTZ 37.5-25 MG Oral Capsule;  Therapy: (Recorded:20Nov2008) to Recorded 6. Vivelle 0.025 MG/24HR PTTW;  Therapy: (Recorded:20Nov2008) to Recorded  Allergies Medication  1. Codeine Derivatives Non-Medication  2. Contrast Dye  Family History Problems  1. Family history of Brain Cancer : Mother 2. Family history of Family Health Status Number Of Children   2 sons 3. Family history of renal failure (Z84.1) 4. Family history of Father  Deceased At Age ____ : Sister   5874 Brain tumor 5. Family history of Liver Cancer : Sister 6. Family history of Mother Deceased At Age ____ : Sister   2270 liver ca  Social History Problems  1. Denied: History of Alcohol Use 2. Caffeine Use   1 or 2 3. Marital History - Currently Married 4. Never smoker 5. Denied: History of Tobacco Use  Review of Systems No changes in pts bowel habits, neurological changes, or progressive lower urinary tract symptoms.    Vitals Vital Signs [Data Includes: Last 1 Day]  Recorded: 01Mar2016 12:41PM  Blood Pressure: 144 / 90 Heart Rate: 97  Physical Exam Constitutional: Well nourished and well developed . No acute distress.  ENT:. The ears and nose are normal in appearance.  Neck: The appearance of the neck is normal and no neck mass is present.  Pulmonary: No respiratory distress and normal respiratory rhythm and effort.  Cardiovascular: Heart rate and rhythm are normal . No peripheral edema.  Abdomen: The abdomen is soft and nontender. No masses are palpated. No CVA tenderness. No hernias are palpable. No hepatosplenomegaly noted.  Skin: Normal skin turgor, no visible rash and no visible skin lesions.  Neuro/Psych:. Mood and affect are appropriate.    Results/Data Patient did not leave a urine sample  I've individually reviewed the patient's CT scan revealing 2 stones within the right collecting system and likely narrow UPJ.     Plan Kidney stone on right side  1. Follow-up Keep Future Appt Office  Follow-up  Status: Complete  Done: 01Mar2016 2. 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 We went over the treatment algorithm for her which I recommended a staged ureteroscopy. The first stage would  include dilation of her UPJ and laser lithotripsy. The second stage will likely involve stone extraction. I went over the risks and benefits of the operation in detail. We'll get this patient scheduled as soon as possible.

## 2014-11-11 NOTE — Op Note (Signed)
Preoperative diagnosis: right renal calculus  Postoperative diagnosis: right renal calculus  Procedure:  1. Cystoscopy 2. right ureteroscopy and stone removal 3. right 59F x 24cm ureteral stent placement  4. right retrograde pyelography with interpretation  Surgeon: Crist Fat, MD  Anesthesia: General  Complications: None  Intraoperative findings: Right retrograde pyelography demonstrated a filling no defect or evidence of UPJ obstruction.  There were no other abnormalities.  EBL: Minimal  Specimens: 1. Right renal calculus  Disposition of specimens: [facility] for stone analysis  Indication: Kerry Walters is a 69 y.o.   patient with urolithiasis. After reviewing the management options for treatment, the patient elected to proceed with the above surgical procedure(s). We have discussed the potential benefits and risks of the procedure, side effects of the proposed treatment, the likelihood of the patient achieving the goals of the procedure, and any potential problems that might occur during the procedure or recuperation. Informed consent has been obtained.  Description of procedure:  The patient was taken to the operating room and general anesthesia was induced.  The patient was placed in the dorsal lithotomy position, prepped and draped in the usual sterile fashion, and preoperative antibiotics were administered. A preoperative time-out was performed.   Cystourethroscopy was performed.  The patient's urethra was examined and was normal.  The bladder was then systematically examined in its entirety. There was no evidence for any bladder tumors, stones, or other mucosal pathology.    Attention then turned to the right ureteral orifice and a ureteral catheter was used to intubate the ureteral orifice.  Omnipaque contrast was injected through the ureteral catheter and a retrograde pyelogram was performed with findings as dictated above.  A 0.38 sensor  guidewire was then advanced up the right ureter into the renal pelvis under fluoroscopic guidance. The 6 Fr semirigid ureteroscope was then advanced into the ureter and into the proximal ureter.  I then backed the scope out after placing a second 0.38 sensor wire. I then advanced a 12/14 [place] ureteral access sheath over the second wire and gently advanced it under fluoroscopic guidance up to the proximal ureter. Then removed the inner portion of the access sheath and passed a flexible ureteroscope into the right renal pelvis. I then performed pyeloscopy using fluoroscopic guidance and ultimately encountered 2 calyces in the lower pole that had numerous smaller stone fragments. Using the engage and in Compass baskets I was able to get the majority of these larger fragments. There were several small fragments that escaped the basket as they were too small to grasp. I then confirmed that there were no additional large stone fragments using fluoroscopic guided pyeloscopy. I then slowly backed out the flexible ureteroscope removing the access sheath as I pulled back through the ureter and visually inspected the ureter noting no mucosal abnormalities or stones within the ureter.  I then advanced the stent over the working wire and into the right renal pelvis under fluoroscopic guidance. Once this proximal end was in the right renal pelvis I pulled the wire holding pressure against the pubic bone so as to prevent a stent from sliding out which ultimately retracted into the patient's bladder. A nice curl was noted in the renal pelvis as well as the bladder using fluoroscopy. At this point I emptied the patient's bladder using the cystoscope sheath and tucked the string into the patient's vagina.  The patient was subsequently awakened and transferred to the PACU  without complications and in satisfactory condition.  Disposition: The tether of the stent was left on tucked inside the patient's vagina.  The patient  will be scheduled for stent removal in 5-7 days in our clinic.

## 2014-11-11 NOTE — Addendum Note (Signed)
Addendum  created 11/11/14 1303 by Tyrone Nineobin F , CRNA   Modules edited: Charges VN

## 2014-11-11 NOTE — Anesthesia Procedure Notes (Signed)
Procedure Name: Intubation Date/Time: 11/11/2014 9:10 AM Performed by: Tyrone NineSAUVE,  F Pre-anesthesia Checklist: Patient identified, Timeout performed, Emergency Drugs available, Suction available and Patient being monitored Patient Re-evaluated:Patient Re-evaluated prior to inductionOxygen Delivery Method: Circle system utilized Preoxygenation: Pre-oxygenation with 100% oxygen Intubation Type: IV induction Ventilation: Mask ventilation without difficulty Laryngoscope Size: Mac and 3 Grade View: Grade I Tube type: Oral Tube size: 7.0 mm Number of attempts: 1 Airway Equipment and Method: Video-laryngoscopy,  Bite block and Rigid stylet Placement Confirmation: ETT inserted through vocal cords under direct vision,  breath sounds checked- equal and bilateral and positive ETCO2 Secured at: 21 cm Tube secured with: Tape Dental Injury: Teeth and Oropharynx as per pre-operative assessment  Difficulty Due To: Difficulty was anticipated, Difficult Airway- due to dentition and Difficult Airway- due to limited oral opening

## 2014-11-11 NOTE — Anesthesia Postprocedure Evaluation (Signed)
  Anesthesia Post-op Note  Patient: Kerry GrovesMary Sue Walters  Procedure(s) Performed: Procedure(s) (LRB): CYSTOSCOPY WITH URETEROSCOPY AND STENT PLACEMENT (Right)  Patient Location: PACU  Anesthesia Type: General  Level of Consciousness: awake and alert   Airway and Oxygen Therapy: Patient Spontanous Breathing  Post-op Pain: mild  Post-op Assessment: Post-op Vital signs reviewed, Patient's Cardiovascular Status Stable, Respiratory Function Stable, Patent Airway and No signs of Nausea or vomiting  Last Vitals:  Filed Vitals:   11/11/14 1130  BP: 135/78  Pulse: 85  Temp: 35.9 C  Resp: 16    Post-op Vital Signs: stable   Complications: No apparent anesthesia complications

## 2014-11-11 NOTE — Discharge Instructions (Signed)
DISCHARGE INSTRUCTIONS FOR KIDNEY STONE/URETERAL STENT   MEDICATIONS:  1. Resume all your other meds from home - except do not take any extra narcotic pain meds that you may have at home.  2. Trospium is to prevent bladder spasms and help reduce urinary frequency. 3. Pyridium is to help with the burning/stinging when you urinate. 4. Tramadol is for breakthrough pain, otherwise taking upto 1000mg  every 6 hours of plainTylenol will help treat your pain.  Do not take both at the same time.   ACTIVITY:  1. No strenuous activity x 1week  2. No driving while on narcotic pain medications  3. Drink plenty of water  4. Continue to walk at home - you can still get blood clots when you are at home, so keep active, but don't over do it.  5. May return to work/school tomorrow or when you feel ready   BATHING:  1. You can shower and we recommend daily showers  2. You have a string coming from your urethra: The stent string is attached to your ureteral stent. Do not pull on this.   SIGNS/SYMPTOMS TO CALL:  Please call us if you have a fever greater than 101.5, uncontrolled nausea/vomiting, uncontrolled pain, dizziness, unable to urinate, bloody urine, chest pain, shortness of breath, leg swelling, leg pain, redness around wound, drainage from wound, or any other concerns or questions.   You can reach us at [phone].   FOLLOW-UP:  1. We will schedule you for follow-up in 1 week for stent removal. Post Anesthesia Home Care Instructions  Activity: Get plenty of rest for the remainder of the day. A responsible adult should stay with you for 24 hours following the procedure.  For the next 24 hours, DO NOT: -Drive a car -Advertising copywriterperate machinery -Drink alcoholic beverages -Take any medication unless instructed by your physician -Make any legal decisions or sign important papers.  Meals: Start with liquid foods such as gelatin or soup. Progress to regular foods as tolerated. Avoid greasy, spicy, heavy  foods. If nausea and/or vomiting occur, drink only clear liquids until the nausea and/or vomiting subsides. Call your physician if vomiting continues.  Special Instructions/Symptoms: Your throat may feel dry or sore from the anesthesia or the breathing tube placed in your throat during surgery. If this causes discomfort, gargle with warm salt water. The discomfort should disappear within 24 hours.

## 2014-11-11 NOTE — Interval H&P Note (Signed)
History and Physical Interval Note:  11/11/2014 8:59 AM  Kerry GrovesMary Sue Sherwin  has presented today for surgery, with the diagnosis of RIGHT RENAL PELVIC STONES  The various methods of treatment have been discussed with the patient and family. After consideration of risks, benefits and other options for treatment, the patient has consented to  Procedure(s): CYSTOSCOPY WITH URETEROSCOPY AND STENT PLACEMENT (Right) HOLMIUM LASER APPLICATION (Right) as a surgical intervention .  The patient's history has been reviewed, patient examined, no change in status, stable for surgery.  I have reviewed the patient's chart and labs.  Questions were answered to the patient's satisfaction.     Berniece SalinesHERRICK,  W

## 2014-11-11 NOTE — Anesthesia Preprocedure Evaluation (Addendum)
Anesthesia Evaluation  Patient identified by MRN, date of birth, ID band Patient awake    Reviewed: Allergy & Precautions, NPO status , Patient's Chart, lab work & pertinent test results  History of Anesthesia Complications (+) PONV  Airway Mallampati: II  TM Distance: <3 FB Neck ROM: Full  Mouth opening: Limited Mouth Opening  Dental no notable dental hx. (+) Teeth Intact, Dental Advisory Given, Caps,    Pulmonary neg pulmonary ROS,  breath sounds clear to auscultation  Pulmonary exam normal       Cardiovascular Exercise Tolerance: Good hypertension, Pt. on medications Rhythm:Regular Rate:Normal     Neuro/Psych negative neurological ROS  negative psych ROS   GI/Hepatic Neg liver ROS, hiatal hernia, GERD-  Medicated and Poorly Controlled,  Endo/Other  Hypothyroidism   Renal/GU negative Renal ROS  negative genitourinary   Musculoskeletal negative musculoskeletal ROS (+)   Abdominal   Peds negative pediatric ROS (+)  Hematology negative hematology ROS (+)   Anesthesia Other Findings   Reproductive/Obstetrics negative OB ROS                          Anesthesia Physical Anesthesia Plan  ASA: II  Anesthesia Plan: General   Post-op Pain Management:    Induction: Intravenous  Airway Management Planned: LMA  Additional Equipment:   Intra-op Plan:   Post-operative Plan: Extubation in OR  Informed Consent: I have reviewed the patients History and Physical, chart, labs and discussed the procedure including the risks, benefits and alternatives for the proposed anesthesia with the patient or authorized representative who has indicated his/her understanding and acceptance.   Dental advisory given  Plan Discussed with: CRNA and Surgeon  Anesthesia Plan Comments:         Anesthesia Quick Evaluation

## 2014-11-12 ENCOUNTER — Ambulatory Visit: Payer: Managed Care, Other (non HMO) | Admitting: Internal Medicine

## 2014-11-12 ENCOUNTER — Encounter (HOSPITAL_BASED_OUTPATIENT_CLINIC_OR_DEPARTMENT_OTHER): Payer: Self-pay | Admitting: Urology

## 2014-11-20 ENCOUNTER — Ambulatory Visit: Payer: No Typology Code available for payment source | Admitting: Internal Medicine

## 2014-11-24 ENCOUNTER — Encounter: Payer: Self-pay | Admitting: Internal Medicine

## 2014-11-24 ENCOUNTER — Ambulatory Visit (INDEPENDENT_AMBULATORY_CARE_PROVIDER_SITE_OTHER): Payer: Medicare Other | Admitting: Internal Medicine

## 2014-11-24 VITALS — BP 128/60 | Temp 98.1°F | Ht 62.25 in | Wt 180.8 lb

## 2014-11-24 DIAGNOSIS — K219 Gastro-esophageal reflux disease without esophagitis: Secondary | ICD-10-CM

## 2014-11-24 DIAGNOSIS — Z23 Encounter for immunization: Secondary | ICD-10-CM | POA: Diagnosis not present

## 2014-11-24 DIAGNOSIS — I10 Essential (primary) hypertension: Secondary | ICD-10-CM

## 2014-11-24 DIAGNOSIS — E119 Type 2 diabetes mellitus without complications: Secondary | ICD-10-CM

## 2014-11-24 DIAGNOSIS — E782 Mixed hyperlipidemia: Secondary | ICD-10-CM | POA: Diagnosis not present

## 2014-11-24 DIAGNOSIS — Z87442 Personal history of urinary calculi: Secondary | ICD-10-CM

## 2014-11-24 MED ORDER — PANTOPRAZOLE SODIUM 40 MG PO TBEC: 40.0000 mg | DELAYED_RELEASE_TABLET | Freq: Every day | ORAL | Status: DC

## 2014-11-24 MED ORDER — LEVOTHYROXINE SODIUM 75 MCG PO TABS: ORAL_TABLET | ORAL | Status: DC

## 2014-11-24 MED ORDER — METFORMIN HCL ER 500 MG PO TB24: ORAL_TABLET | ORAL | Status: DC

## 2014-11-24 NOTE — Progress Notes (Signed)
Pre visit review using our clinic review tool, if applicable. No additional management support is needed unless otherwise documented below in the visit note.  Chief Complaint  Patient presents with  . Follow-up    early dm bp bg     HPI: Kerry Walters 69 y.o. u Chronic disease management  Since last visit had  Renal stone and surgical removal 3 15 right  With stent placement  BG tryin g to eat right  No neuro vision changes BP has been ok but up at surgery . Prefers no change in med at this time Needs refill of thyroid medication  Taking daily without problem [facility] med ? What tio do  No neuropathy cv pulm sx today  ROS: See pertinent positives and negatives per HPI.  Past Medical History  Diagnosis Date  . Hyperlipidemia   . Hypertension   . Nephrolithiasis     RIGHT   . GERD (gastroesophageal reflux disease)   . History of esophageal dilatation     FOR STRICTURE IN 2005  . History of kidney stones   . History of recurrent UTIs   . Pre-diabetes   . History of hyperparathyroidism     PRIMARY--  S/P RIGHT PARATHYROIDECTOMY  . History of hypercalcemia     SECONDARY TO HYPERPARATHYROID  . Hypothyroidism   . Diverticulosis of colon   . Incomplete right bundle branch block   . PONV (postoperative nausea and vomiting)   . Asthma due to environmental allergies     no inhaler  . History of hiatal hernia   . Sciatica of right side   . Nocturia     Family History  Problem Relation Age of Onset  . Cancer Son     liver  . Cancer Father     brain  . Diabetes Sister   . Schizophrenia Son     History   Social History  . Marital Status: Married    Spouse Name: N/A  . Number of Children: N/A  . Years of Education: N/A   Social History Main Topics  . Smoking status: Never Smoker   . Smokeless tobacco: Never Used  . Alcohol Use: No  . Drug Use: No  . Sexual Activity: Not on file   Other Topics Concern  . None   Social History Narrative   Married   hh of 2   4 dogs   G2 P2   Water exercise.   Retired    [employer]    great grandchild              Outpatient Encounter Prescriptions as of 11/24/2014  Medication Sig  . atorvastatin (LIPITOR) 20 MG tablet Take 1 tablet (20 mg total) by mouth daily. (Patient taking differently: Take 20 mg by mouth every evening. )  . levothyroxine (SYNTHROID, LEVOTHROID) 75 MCG tablet TAKE 1 TABLET DAILY  . metFORMIN (GLUCOPHAGE-XR) 500 MG 24 hr tablet Take 3 tablets a day.  . Multiple Vitamin (MULTIVITAMIN) tablet Take 1 tablet by mouth daily.  . pantoprazole (PROTONIX) 40 MG tablet Take 1 tablet (40 mg total) by mouth daily.  [name]. PREMARIN vaginal cream Place 1 Applicatorful vaginally as directed. Use 2-3 times weekly  prn  . triamterene-hydrochlorothiazide (MAXZIDE-25) 37.5-25 MG per tablet Take 1 tablet by mouth daily. (Patient taking differently: Take 1 tablet by mouth every morning. )  . [DISCONTINUED] levothyroxine (SYNTHROID, LEVOTHROID) 75 MCG tablet TAKE 1 TABLET DAILY (Patient taking differently: Take 75 mcg by  mouth daily before breakfast. TAKE 1 TABLET DAILY)  . [DISCONTINUED] metFORMIN (GLUCOPHAGE-XR) 500 MG 24 hr tablet Take 2 tablets (1,000 mg total) by mouth daily with breakfast.  . [DISCONTINUED] pantoprazole (PROTONIX) 40 MG tablet Take 1 tablet (40 mg total) by mouth daily. (Patient taking differently: Take 40 mg by mouth every evening. )  . [DISCONTINUED] phenazopyridine (PYRIDIUM) 200 MG tablet Take 1 tablet (200 mg total) by mouth 3 (three) times daily as needed for pain.  . [DISCONTINUED] traMADol (ULTRAM) 50 MG tablet Take 1 tablet (50 mg total) by mouth every 6 (six) hours as needed.  . [DISCONTINUED] Trospium Chloride 60 MG CP24 Take 1 capsule (60 mg total) by mouth daily.    EXAM:  BP 128/60 mmHg  Temp(Src) 98.1 F (36.7 C) (Oral)  Ht 5' 2.25" (1.581 m)  Wt 180 lb 12.8 oz (82.01 kg)  BMI 32.81 kg/m2  Body mass index is 32.81 kg/(m^2). bp better on repeat sitting    GENERAL: vitals reviewed and listed above, alert, oriented, appears well hydrated and in no acute distress HEENT: atraumatic, conjunctiva  clear, no obvious abnormalities on inspection of external nose and ears  NECK: no obvious masses on inspection palpation  LUNGS: clear to auscultation bilaterally, no wheezes, rales or rhonchi, good air movement CV: HRRR, no clubbing cyanosis or  peripheral edema nl cap refill  MS: moves all extremities without noticeable focal  abnormality PSYCH: pleasant and cooperative, no obvious depression or anxiety Lab Results  Component Value Date   WBC 12.4* 09/05/2014   HGB 14.0 11/11/2014   HCT 40.5 09/05/2014   PLT 378.0 09/05/2014   GLUCOSE 157* 11/05/2014   CHOL 149 05/06/2014   TRIG 125.0 05/06/2014   HDL 39.20 05/06/2014   LDLCALC 85 05/06/2014   ALT 35 09/05/2014   AST 39* 09/05/2014   NA 142 11/05/2014   K 3.8 11/05/2014   CL 105 11/05/2014   CREATININE 0.90 11/05/2014   BUN 16 11/05/2014   CO2 31 11/05/2014   TSH 2.14 05/06/2014   INR 1.12 11/19/2009   HGBA1C 7.4* 11/05/2014   Lab Results  Component Value Date   HGBA1C 7.4* 11/05/2014   HGBA1C 7.4* 09/05/2014   HGBA1C 7.3* 05/06/2014   Lab Results  Component Value Date   LDLCALC 85 05/06/2014   CREATININE 0.90 11/05/2014   BP Readings from Last 3 Encounters:  11/24/14 128/60  11/11/14 170/73  09/05/14 144/88   Wt Readings from Last 3 Encounters:  11/24/14 180 lb 12.8 oz (82.01 kg)  11/11/14 182 lb 8 oz (82.781 kg)  09/05/14 182 lb 6.4 oz (82.736 kg)      ASSESSMENT AND PLAN:  Discussed the following assessment and plan:  Diabetes mellitus without complication - could be better  inc metfromin to 1500 per day  HYPERLIPIDEMIA  Essential hypertension - monitor pt prefers no change  consider acei arb  Need for tetanus booster - Plan: Td vaccine greater than or equal to 7yo preservative free IM  Gastroesophageal reflux disease, esophagitis presence not specified -  with HH disc med and lsi weight loss  NEPHROLITHIASIS, HX OF - recent stent  Total visit > 50% spent counseling and coordinating care  -Patient advised to return or notify health care team  if symptoms worsen ,persist or new concerns arise.  Patient Instructions    Intensify lifestyle interventions.change  DM medication to increase metformin Glad you are doing better. blood pressure is better on repeat .  Check ocassionally to make sure  below 140/90.  Return in 3-4 months(A1C pre visit)     Neta Mends. Panosh M.D.

## 2014-11-24 NOTE — Progress Notes (Signed)
Pre visit review using our clinic review tool, if applicable. No additional management support is needed unless otherwise documented below in the visit note.  Chief Complaint  Patient presents with  . Follow-up    early dm bp bg     HPI: Kerry GrovesMary Sue Walters 69 y.o. u Chronic disease management  Since last visit had  Renal stone and surgical removal 3 15 right  With stent placement   With removal and fu . Still toired   ROS: See pertinent positives and negatives per HPI.  Past Medical History  Diagnosis Date  . Hyperlipidemia   . Hypertension   . Nephrolithiasis     RIGHT   . GERD (gastroesophageal reflux disease)   . History of esophageal dilatation     FOR STRICTURE IN 2005  . History of kidney stones   . History of recurrent UTIs   . Pre-diabetes   . History of hyperparathyroidism     PRIMARY--  S/P RIGHT PARATHYROIDECTOMY  . History of hypercalcemia     SECONDARY TO HYPERPARATHYROID  . Hypothyroidism   . Diverticulosis of colon   . Incomplete right bundle branch block   . PONV (postoperative nausea and vomiting)   . Asthma due to environmental allergies     no inhaler  . History of hiatal hernia   . Sciatica of right side   . Nocturia     Family History  Problem Relation Age of Onset  . Cancer Son     liver  . Cancer Father     brain  . Diabetes Sister   . Schizophrenia Son     History   Social History  . Marital Status: Married    Spouse Name: N/A  . Number of Children: N/A  . Years of Education: N/A   Social History Main Topics  . Smoking status: Never Smoker   . Smokeless tobacco: Never Used  . Alcohol Use: No  . Drug Use: No  . Sexual Activity: Not on file   Other Topics Concern  . None   Social History Narrative   Married   hh of 2   4 dogs   G2 P2   Water exercise.   Retired    [employer]    great grandchild              Outpatient Encounter Prescriptions as of 11/24/2014  Medication Sig  . atorvastatin (LIPITOR) 20 MG tablet Take  1 tablet (20 mg total) by mouth daily. (Patient taking differently: Take 20 mg by mouth every evening. )  . levothyroxine (SYNTHROID, LEVOTHROID) 75 MCG tablet TAKE 1 TABLET DAILY  . metFORMIN (GLUCOPHAGE-XR) 500 MG 24 hr tablet Take 3 tablets a day.  . Multiple Vitamin (MULTIVITAMIN) tablet Take 1 tablet by mouth daily.  . pantoprazole (PROTONIX) 40 MG tablet Take 1 tablet (40 mg total) by mouth daily.  [name]. PREMARIN vaginal cream Place 1 Applicatorful vaginally as directed. Use 2-3 times weekly  prn  . triamterene-hydrochlorothiazide (MAXZIDE-25) 37.5-25 MG per tablet Take 1 tablet by mouth daily. (Patient taking differently: Take 1 tablet by mouth every morning. )  . [DISCONTINUED] levothyroxine (SYNTHROID, LEVOTHROID) 75 MCG tablet TAKE 1 TABLET DAILY (Patient taking differently: Take 75 mcg by mouth daily before breakfast. TAKE 1 TABLET DAILY)  . [DISCONTINUED] metFORMIN (GLUCOPHAGE-XR) 500 MG 24 hr tablet Take 2 tablets (1,000 mg total) by mouth daily with breakfast.  . [DISCONTINUED] pantoprazole (PROTONIX) 40 MG tablet Take 1 tablet (40 mg total) by mouth  daily. (Patient taking differently: Take 40 mg by mouth every evening. )  . [DISCONTINUED] phenazopyridine (PYRIDIUM) 200 MG tablet Take 1 tablet (200 mg total) by mouth 3 (three) times daily as needed for pain.  . [DISCONTINUED] traMADol (ULTRAM) 50 MG tablet Take 1 tablet (50 mg total) by mouth every 6 (six) hours as needed.  . [DISCONTINUED] Trospium Chloride 60 MG CP24 Take 1 capsule (60 mg total) by mouth daily.    EXAM:  BP 128/60 mmHg  Temp(Src) 98.1 F (36.7 C) (Oral)  Ht 5' 2.25" (1.581 m)  Wt 180 lb 12.8 oz (82.01 kg)  BMI 32.81 kg/m2  Body mass index is 32.81 kg/(m^2).  GENERAL: vitals reviewed and listed above, alert, oriented, appears well hydrated and in no acute distress HEENT: atraumatic, conjunctiva  clear, no obvious abnormalities on inspection of external nose and ears OP : no lesion edema or exudate  NECK: no  obvious masses on inspection palpation  LUNGS: clear to auscultation bilaterally, no wheezes, rales or rhonchi, good air movement CV: HRRR, no clubbing cyanosis or  peripheral edema nl cap refill  MS: moves all extremities without noticeable focal  abnormality PSYCH: pleasant and cooperative, no obvious depression or anxiety Lab Results  Component Value Date   WBC 12.4* 09/05/2014   HGB 14.0 11/11/2014   HCT 40.5 09/05/2014   PLT 378.0 09/05/2014   GLUCOSE 157* 11/05/2014   CHOL 149 05/06/2014   TRIG 125.0 05/06/2014   HDL 39.20 05/06/2014   LDLCALC 85 05/06/2014   ALT 35 09/05/2014   AST 39* 09/05/2014   NA 142 11/05/2014   K 3.8 11/05/2014   CL 105 11/05/2014   CREATININE 0.90 11/05/2014   BUN 16 11/05/2014   CO2 31 11/05/2014   TSH 2.14 05/06/2014   INR 1.12 11/19/2009   HGBA1C 7.4* 11/05/2014   Lab Results  Component Value Date   HGBA1C 7.4* 11/05/2014   HGBA1C 7.4* 09/05/2014   HGBA1C 7.3* 05/06/2014   Lab Results  Component Value Date   LDLCALC 85 05/06/2014   CREATININE 0.90 11/05/2014   BP Readings from Last 3 Encounters:  11/24/14 128/60  11/11/14 170/73  09/05/14 144/88   Wt Readings from Last 3 Encounters:  11/24/14 180 lb 12.8 oz (82.01 kg)  11/11/14 182 lb 8 oz (82.781 kg)  09/05/14 182 lb 6.4 oz (82.736 kg)      ASSESSMENT AND PLAN:  Discussed the following assessment and plan:  Diabetes mellitus without complication - could be better  inc metfromin to 1500 per day  HYPERLIPIDEMIA  Essential hypertension - monitor pt prefers no change  consider acei arb  Need for tetanus booster - Plan: Td vaccine greater than or equal to 7yo preservative free IM  Gastroesophageal reflux disease, esophagitis presence not specified - with HH disc med and lsi weight loss Trial of januvia    Check bp  And consider  Other intervention.  -Patient advised to return or notify health care team  if symptoms worsen ,persist or new concerns arise.  Patient  Instructions    Intensify lifestyle interventions.change  DM medication to increase metformin Glad you are doing better. blood pressure is better on repeat .  Check ocassionally to make sure below 140/90.  Return in 3-4 months(A1C pre visit)        Neta Mends. Panosh M.D.

## 2014-11-24 NOTE — Patient Instructions (Addendum)
   Intensify lifestyle interventions.change  DM medication to increase metformin Glad you are doing better. blood pressure is better on repeat .  Check ocassionally to make sure below 140/90.  Return in 3-4 months(A1C pre visit)

## 2015-02-16 ENCOUNTER — Other Ambulatory Visit (INDEPENDENT_AMBULATORY_CARE_PROVIDER_SITE_OTHER): Payer: Medicare Other

## 2015-02-16 DIAGNOSIS — E119 Type 2 diabetes mellitus without complications: Secondary | ICD-10-CM | POA: Diagnosis not present

## 2015-02-16 LAB — HEMOGLOBIN A1C: Hgb A1c MFr Bld: 6.8 % — ABNORMAL HIGH (ref 4.6–6.5)

## 2015-02-23 ENCOUNTER — Encounter: Payer: Self-pay | Admitting: Internal Medicine

## 2015-02-23 ENCOUNTER — Ambulatory Visit (INDEPENDENT_AMBULATORY_CARE_PROVIDER_SITE_OTHER): Payer: Medicare Other | Admitting: Internal Medicine

## 2015-02-23 VITALS — BP 134/64 | Temp 97.4°F | Ht 62.25 in | Wt 179.4 lb

## 2015-02-23 DIAGNOSIS — E119 Type 2 diabetes mellitus without complications: Secondary | ICD-10-CM | POA: Diagnosis not present

## 2015-02-23 DIAGNOSIS — R945 Abnormal results of liver function studies: Secondary | ICD-10-CM

## 2015-02-23 DIAGNOSIS — I10 Essential (primary) hypertension: Secondary | ICD-10-CM

## 2015-02-23 DIAGNOSIS — Z87442 Personal history of urinary calculi: Secondary | ICD-10-CM

## 2015-02-23 DIAGNOSIS — I7090 Unspecified atherosclerosis: Secondary | ICD-10-CM

## 2015-02-23 DIAGNOSIS — R7989 Other specified abnormal findings of blood chemistry: Secondary | ICD-10-CM | POA: Diagnosis not present

## 2015-02-23 DIAGNOSIS — E785 Hyperlipidemia, unspecified: Secondary | ICD-10-CM | POA: Diagnosis not present

## 2015-02-23 LAB — MICROALBUMIN / CREATININE URINE RATIO
CREATININE, U: 94.8 mg/dL
MICROALB/CREAT RATIO: 1.3 mg/g (ref 0.0–30.0)
Microalb, Ur: 1.2 mg/dL (ref 0.0–1.9)

## 2015-02-23 MED ORDER — ATORVASTATIN CALCIUM 20 MG PO TABS: 20.0000 mg | ORAL_TABLET | Freq: Every evening | ORAL | Status: DC

## 2015-02-23 NOTE — Patient Instructions (Signed)
Continue lifestyle intervention healthy eating and exercise . Continue medication .  wellenss with full set of labs and hga1c in about 6 months

## 2015-02-23 NOTE — Progress Notes (Signed)
Pre visit review using our clinic review tool, if applicable. No additional management support is needed unless otherwise documented below in the visit note.  Chief Complaint  Patient presents with  . Follow-up    BG, LIPIDS etc     HPI: Kerry Walters 69 y.o.  comes in for chronic disease/ medication management   BG: now on  1500 mg per day  Metformin   No neuropathy no eye sx   Renal Stones:  Stable one left right   Now on magnesium and b6  To avoid C   No abd pain now.   BP: takes med doesn't check it   LIPID:  Needs  refill atorva  Before next visit  ROS: See pertinent positives and negatives per HPI.  Past Medical History  Diagnosis Date  . Hyperlipidemia   . Hypertension   . Nephrolithiasis     RIGHT   . GERD (gastroesophageal reflux disease)   . History of esophageal dilatation     FOR STRICTURE IN 2005  . History of kidney stones   . History of recurrent UTIs   . Pre-diabetes   . History of hyperparathyroidism     PRIMARY--  S/P RIGHT PARATHYROIDECTOMY  . History of hypercalcemia     SECONDARY TO HYPERPARATHYROID  . Hypothyroidism   . Diverticulosis of colon   . Incomplete right bundle branch block   . PONV (postoperative nausea and vomiting)   . Asthma due to environmental allergies     no inhaler  . History of hiatal hernia   . Sciatica of right side   . Nocturia     Family History  Problem Relation Age of Onset  . Cancer Son     liver  . Cancer Father     brain  . Diabetes Sister   . Schizophrenia Son     History   Social History  . Marital Status: Married    Spouse Name: N/A  . Number of Children: N/A  . Years of Education: N/A   Social History Main Topics  . Smoking status: Never Smoker   . Smokeless tobacco: Never Used  . Alcohol Use: No  . Drug Use: No  . Sexual Activity: Not on file   Other Topics Concern  . Not on file   Social History Narrative   Married   hh of 2   4 dogs   G2 P2   Water exercise.   Retired    [employer]    great grandchild              Outpatient Prescriptions Prior to Visit  Medication Sig Dispense Refill  . levothyroxine (SYNTHROID, LEVOTHROID) 75 MCG tablet TAKE 1 TABLET DAILY 90 tablet 2  . metFORMIN (GLUCOPHAGE-XR) 500 MG 24 hr tablet Take 3 tablets a day. 270 tablet 2  . Multiple Vitamin (MULTIVITAMIN) tablet Take 1 tablet by mouth daily.    . pantoprazole (PROTONIX) 40 MG tablet Take 1 tablet (40 mg total) by mouth daily. 90 tablet 2  . PREMARIN vaginal cream Place 1 Applicatorful vaginally as directed. Use 2-3 times weekly  prn    . triamterene-hydrochlorothiazide (MAXZIDE-25) 37.5-25 MG per tablet Take 1 tablet by mouth daily. (Patient taking differently: Take 1 tablet by mouth every morning. ) 90 tablet 3  . atorvastatin (LIPITOR) 20 MG tablet Take 1 tablet (20 mg total) by mouth daily. (Patient taking differently: Take 20 mg by mouth every evening. ) 90 tablet 1   No facility-administered  medications prior to visit.     EXAM:  BP 134/64 mmHg  Temp(Src) 97.4 F (36.3 C) (Oral)  Ht 5' 2.25" (1.581 m)  Wt 179 lb 6.4 oz (81.375 kg)  BMI 32.56 kg/m2  Body mass index is 32.56 kg/(m^2).  GENERAL: vitals reviewed and listed above, alert, oriented, appears well hydrated and in no acute distress HEENT: atraumatic, conjunctiva  clear, no obvious abnormalities on inspection of external nose and ears  CV: HRRR, no clubbing cyanosis or  peripheral edema nl cap refill  MS: moves all extremities without noticeable focal  Abnormality Diabetic Foot Exam - Simple   Simple Foot Form  Diabetic Foot exam was performed with the following findings:  Yes 02/23/2015  8:47 AM  Visual Inspection  No deformities, no ulcerations, no other skin breakdown bilaterally:  Yes  Sensation Testing  Intact to touch and monofilament testing bilaterally:  Yes  Pulse Check  Posterior Tibialis and Dorsalis pulse intact bilaterally:  Yes  Comments      PSYCH: pleasant and cooperative, no  obvious depression or anxiety Lab Results  Component Value Date   WBC 12.4* 09/05/2014   HGB 14.0 11/11/2014   HCT 40.5 09/05/2014   PLT 378.0 09/05/2014   GLUCOSE 157* 11/05/2014   CHOL 149 05/06/2014   TRIG 125.0 05/06/2014   HDL 39.20 05/06/2014   LDLCALC 85 05/06/2014   ALT 35 09/05/2014   AST 39* 09/05/2014   NA 142 11/05/2014   K 3.8 11/05/2014   CL 105 11/05/2014   CREATININE 0.90 11/05/2014   BUN 16 11/05/2014   CO2 31 11/05/2014   TSH 2.14 05/06/2014   INR 1.12 11/19/2009   HGBA1C 6.8* 02/16/2015   Diabetes Health Maintenance Due  Topic Date Due  . FOOT EXAM  02/14/1956  . OPHTHALMOLOGY EXAM  02/14/1956  . URINE MICROALBUMIN  02/14/1956  . HEMOGLOBIN A1C  08/18/2015     BP Readings from Last 3 Encounters:  02/23/15 134/64  11/24/14 128/60  11/11/14 170/73    ASSESSMENT AND PLAN:  Discussed the following assessment and plan:  Diabetes mellitus without complication - better  continue lsi and metformin - Plan: Microalbumin / creatinine urine ratio  Hyperlipidemia - refill atorva  written given .  Essential hypertension - controlled not on acei consider change or add on if needed   Abnormal LFTs - liver ok on ct from jan prob med related can wait until next lab check   Personal history of kidney stones  Atherosclerosis - seen on ct  Wellness  Or yearly visit  In about 6 months or as needed  -Patient advised to return or notify health care team  if symptoms worsen ,persist or new concerns arise. Patient Instructions  Continue lifestyle intervention healthy eating and exercise . Continue medication .  wellenss with full set of labs and hga1c in about 6 months       Neta Mends.  M.D. IMPRESSION: 1. No acute findings in the abdomen or pelvis to account for the patient's lower abdominal pain or diarrhea. 2. There is colonic diverticulosis, but no current findings suggestive of acute diverticulitis at this time. 3. Nonobstructive calculi are  present within the lower pole collecting system of the right kidney, largest of which measures 12 mm. There is fullness in the right renal collecting system, with an appearance that could suggest a low-grade stricture at the right UPJ, likely related to prior stone passage. Mild scarring is also noted in the right kidney. 4. Large hiatal  hernia. 5. Atherosclerosis.   Electronically Signed  By: Trudie Reed M.D.  On: 09/11/2014 10:54

## 2015-03-06 LAB — HM DIABETES EYE EXAM

## 2015-03-09 ENCOUNTER — Encounter: Payer: Self-pay | Admitting: Family Medicine

## 2015-03-27 LAB — HM MAMMOGRAPHY

## 2015-03-30 ENCOUNTER — Encounter: Payer: Self-pay | Admitting: Family Medicine

## 2015-05-18 ENCOUNTER — Ambulatory Visit (INDEPENDENT_AMBULATORY_CARE_PROVIDER_SITE_OTHER): Payer: Medicare Other | Admitting: Internal Medicine

## 2015-05-18 ENCOUNTER — Encounter: Payer: Self-pay | Admitting: Internal Medicine

## 2015-05-18 VITALS — BP 154/74 | Temp 97.9°F | Ht 62.25 in | Wt 179.2 lb

## 2015-05-18 DIAGNOSIS — Z23 Encounter for immunization: Secondary | ICD-10-CM

## 2015-05-18 DIAGNOSIS — R300 Dysuria: Secondary | ICD-10-CM

## 2015-05-18 LAB — POCT URINALYSIS DIPSTICK
Bilirubin, UA: NEGATIVE
Glucose, UA: NEGATIVE
KETONES UA: NEGATIVE
NITRITE UA: NEGATIVE
PROTEIN UA: NEGATIVE
Spec Grav, UA: <=1.005
Urobilinogen, UA: 0.2
pH, UA: 6

## 2015-05-18 MED ORDER — CEPHALEXIN 500 MG PO CAPS: 500.0000 mg | ORAL_CAPSULE | Freq: Two times a day (BID) | ORAL | Status: DC

## 2015-05-18 NOTE — Patient Instructions (Signed)
I agree this is uti   Will let you know cutlure results when back. Contact us if not better by end of medication.  Can do stool test called IFOB for colon  Screening  Not sure that cologuard ,meets requirements for  Colon screening .

## 2015-05-18 NOTE — Progress Notes (Signed)
Pre visit review using our clinic review tool, if applicable. No additional management support is needed unless otherwise documented below in the visit note.  Chief Complaint  Patient presents with  . Dysuria    X1wk  . Urinary Urgency  . Urinary Frequency    HPI: Patient Kerry Walters  comes in today for SDA for  new problem evaluation.  Over a week of dysuria  Tried cystex  And  Didn't help now up all night with freuwuency likea uti . No flank p hematuria  Fever chills .   " bactrim  doesn't work "  Had had times and never cured her utis ROS: See pertinent positives and negatives per HPI.  Past Medical History  Diagnosis Date  . Hyperlipidemia   . Hypertension   . Nephrolithiasis     RIGHT   . GERD (gastroesophageal reflux disease)   . History of esophageal dilatation     FOR STRICTURE IN 2005  . History of kidney stones   . History of recurrent UTIs   . Pre-diabetes   . History of hyperparathyroidism     PRIMARY--  S/P RIGHT PARATHYROIDECTOMY  . History of hypercalcemia     SECONDARY TO HYPERPARATHYROID  . Hypothyroidism   . Diverticulosis of colon   . Incomplete right bundle branch block   . PONV (postoperative nausea and vomiting)   . Asthma due to environmental allergies     no inhaler  . History of hiatal hernia   . Sciatica of right side   . Nocturia     Family History  Problem Relation Age of Onset  . Cancer Son     liver  . Cancer Father     brain  . Diabetes Sister   . Schizophrenia Son     Social History   Social History  . Marital Status: Married    Spouse Name: N/A  . Number of Children: N/A  . Years of Education: N/A   Social History Main Topics  . Smoking status: Never Smoker   . Smokeless tobacco: Never Used  . Alcohol Use: No  . Drug Use: No  . Sexual Activity: Not on file   Other Topics Concern  . Not on file   Social History Narrative   Married   hh of 2   4 dogs   G2 P2   Water exercise.   Retired    [employer]    great  grandchild              Outpatient Prescriptions Prior to Visit  Medication Sig Dispense Refill  . atorvastatin (LIPITOR) 20 MG tablet Take 1 tablet (20 mg total) by mouth every evening. 90 tablet 1  . levothyroxine (SYNTHROID, LEVOTHROID) 75 MCG tablet TAKE 1 TABLET DAILY 90 tablet 2  . metFORMIN (GLUCOPHAGE-XR) 500 MG 24 hr tablet Take 3 tablets a day. 270 tablet 2  . pantoprazole (PROTONIX) 40 MG tablet Take 1 tablet (40 mg total) by mouth daily. 90 tablet 2  . PREMARIN vaginal cream Place 1 Applicatorful vaginally as directed. Use 2-3 times weekly  prn    . triamterene-hydrochlorothiazide (MAXZIDE-25) 37.5-25 MG per tablet Take 1 tablet by mouth daily. (Patient taking differently: Take 1 tablet by mouth every morning. ) 90 tablet 3  . Multiple Vitamin (MULTIVITAMIN) tablet Take 1 tablet by mouth daily.     No facility-administered medications prior to visit.     EXAM:  BP 154/74 mmHg  Temp(Src) 97.9 F (36.6 C) (  Oral)  Ht 5' 2.25" (1.581 m)  Wt 179 lb 3.2 oz (81.285 kg)  BMI 32.52 kg/m2  Body mass index is 32.52 kg/(m^2).  GENERAL: vitals reviewed and listed above, alert, oriented, appears well hydrated and in no acute distress HEENT: atraumatic, conjunctiva  clear, no obvious abnormalities on inspection of external nose and ears NECK: no obvious masses on inspection palpation  LUNGS: clear to auscultation bilaterally, no wheezes, rales or rhonchi, good air movement No falnk pain cva tenderness CV: HRRR, no clubbing cyanosis nl cap refill  MS: moves all extremities without noticeable focal  abnormality PSYCH: pleasant and cooperative, no obvious depression or anxiety ua 1+ leuk  ASSESSMENT AND PLAN:  Discussed the following assessment and plan:  Dysuria - Plan: POC Urinalysis Dipstick, Culture, Urine  Need for prophylactic vaccination and inoculation against influenza - Plan: Flu vaccine HIGH DOSE PF (Fluzone High dose) Empiric rx  Change med as per cx and clinical   Status.  Pt doesn't want to do colonoscopy for screening  n opolyps nl risk  Disc ifob  Uncertain if cologuard qualifies and screen.  -Patient advised to return or notify health care team  if symptoms worsen ,persist or new concerns arise.  Patient Instructions  I agree this is uti   Will let you know cutlure results when back. Contact us if not better by end of medication.  Can do stool test called IFOB for colon  Screening  Not sure that cologuard ,meets requirements for  Colon screening .    Neta Mends. Panosh M.D.

## 2015-05-21 LAB — URINE CULTURE: Colony Count: >=100000

## 2015-05-21 NOTE — Progress Notes (Signed)
Quick Note:  Tell patient that urine culture shows e coli . Should resolve with current treatment .FU if not better. ______

## 2015-05-22 ENCOUNTER — Encounter: Payer: Self-pay | Admitting: Internal Medicine

## 2015-07-13 ENCOUNTER — Other Ambulatory Visit: Payer: Self-pay | Admitting: Family Medicine

## 2015-07-13 MED ORDER — TRIAMTERENE-HCTZ 37.5-25 MG PO TABS: 1.0000 | ORAL_TABLET | Freq: Every day | ORAL | Status: DC

## 2015-07-13 NOTE — Telephone Encounter (Signed)
Sent to the pharmacy by e-scribe. 

## 2015-08-04 ENCOUNTER — Other Ambulatory Visit (INDEPENDENT_AMBULATORY_CARE_PROVIDER_SITE_OTHER): Payer: Medicare Other

## 2015-08-04 DIAGNOSIS — Z0000 Encounter for general adult medical examination without abnormal findings: Secondary | ICD-10-CM

## 2015-08-04 LAB — CBC WITH DIFFERENTIAL/PLATELET
BASOS ABS: 0 10*3/uL (ref 0.0–0.1)
Basophils Relative: 0.4 % (ref 0.0–3.0)
EOS ABS: 0.3 10*3/uL (ref 0.0–0.7)
Eosinophils Relative: 3.4 % (ref 0.0–5.0)
HCT: 39.5 % (ref 36.0–46.0)
HEMOGLOBIN: 12.9 g/dL (ref 12.0–15.0)
Lymphocytes Relative: 27.3 % (ref 12.0–46.0)
Lymphs Abs: 2.3 10*3/uL (ref 0.7–4.0)
MCHC: 32.8 g/dL (ref 30.0–36.0)
MCV: 87 fl (ref 78.0–100.0)
MONO ABS: 0.5 10*3/uL (ref 0.1–1.0)
Monocytes Relative: 6.4 % (ref 3.0–12.0)
Neutro Abs: 5.3 10*3/uL (ref 1.4–7.7)
Neutrophils Relative %: 62.5 % (ref 43.0–77.0)
Platelets: 275 10*3/uL (ref 150.0–400.0)
RBC: 4.54 Mil/uL (ref 3.87–5.11)
RDW: 14.3 % (ref 11.5–15.5)
WBC: 8.4 10*3/uL (ref 4.0–10.5)

## 2015-08-04 LAB — HEPATIC FUNCTION PANEL
ALBUMIN: 3.9 g/dL (ref 3.5–5.2)
ALT: 28 U/L (ref 0–35)
AST: 23 U/L (ref 0–37)
Alkaline Phosphatase: 85 U/L (ref 39–117)
BILIRUBIN TOTAL: 0.3 mg/dL (ref 0.2–1.2)
Bilirubin, Direct: 0 mg/dL (ref 0.0–0.3)
TOTAL PROTEIN: 6.9 g/dL (ref 6.0–8.3)

## 2015-08-04 LAB — BASIC METABOLIC PANEL
BUN: 18 mg/dL (ref 6–23)
CALCIUM: 10.1 mg/dL (ref 8.4–10.5)
CO2: 31 mEq/L (ref 19–32)
CREATININE: 1 mg/dL (ref 0.40–1.20)
Chloride: 106 mEq/L (ref 96–112)
GFR: 58.01 mL/min — AB (ref >60.00)
GLUCOSE: 145 mg/dL — AB (ref 70–99)
POTASSIUM: 4.8 meq/L (ref 3.5–5.1)
Sodium: 144 mEq/L (ref 135–145)

## 2015-08-04 LAB — HEMOGLOBIN A1C: HEMOGLOBIN A1C: 6.9 % — AB (ref 4.6–6.5)

## 2015-08-04 LAB — LIPID PANEL
CHOLESTEROL: 138 mg/dL (ref 0–200)
HDL: 35.5 mg/dL — ABNORMAL LOW (ref >39.00)
LDL Cholesterol: 85 mg/dL (ref 0–99)
NONHDL: 102.74
Total CHOL/HDL Ratio: 4
Triglycerides: 88 mg/dL (ref 0.0–149.0)
VLDL: 17.6 mg/dL (ref 0.0–40.0)

## 2015-08-04 LAB — TSH: TSH: 3.39 u[IU]/mL (ref 0.35–4.50)

## 2015-08-11 ENCOUNTER — Encounter: Payer: Self-pay | Admitting: Internal Medicine

## 2015-08-11 ENCOUNTER — Ambulatory Visit (INDEPENDENT_AMBULATORY_CARE_PROVIDER_SITE_OTHER): Payer: Medicare Other | Admitting: Internal Medicine

## 2015-08-11 VITALS — BP 142/70 | Temp 97.4°F | Ht 62.75 in | Wt 177.6 lb

## 2015-08-11 DIAGNOSIS — E785 Hyperlipidemia, unspecified: Secondary | ICD-10-CM | POA: Diagnosis not present

## 2015-08-11 DIAGNOSIS — G4700 Insomnia, unspecified: Secondary | ICD-10-CM

## 2015-08-11 DIAGNOSIS — Z0000 Encounter for general adult medical examination without abnormal findings: Secondary | ICD-10-CM | POA: Diagnosis not present

## 2015-08-11 DIAGNOSIS — Z79899 Other long term (current) drug therapy: Secondary | ICD-10-CM

## 2015-08-11 DIAGNOSIS — I10 Essential (primary) hypertension: Secondary | ICD-10-CM | POA: Diagnosis not present

## 2015-08-11 DIAGNOSIS — R258 Other abnormal involuntary movements: Secondary | ICD-10-CM | POA: Diagnosis not present

## 2015-08-11 DIAGNOSIS — E119 Type 2 diabetes mellitus without complications: Secondary | ICD-10-CM

## 2015-08-11 DIAGNOSIS — Z23 Encounter for immunization: Secondary | ICD-10-CM

## 2015-08-11 MED ORDER — ATORVASTATIN CALCIUM 20 MG PO TABS: 20.0000 mg | ORAL_TABLET | Freq: Every evening | ORAL | Status: DC

## 2015-08-11 MED ORDER — GABAPENTIN 100 MG PO CAPS: 100.0000 mg | ORAL_CAPSULE | Freq: Every day | ORAL | Status: DC

## 2015-08-11 MED ORDER — TRIAMTERENE-HCTZ 37.5-25 MG PO TABS: 1.0000 | ORAL_TABLET | Freq: Every day | ORAL | Status: DC

## 2015-08-11 MED ORDER — LEVOTHYROXINE SODIUM 75 MCG PO TABS: ORAL_TABLET | ORAL | Status: DC

## 2015-08-11 MED ORDER — PANTOPRAZOLE SODIUM 40 MG PO TBEC: 40.0000 mg | DELAYED_RELEASE_TABLET | Freq: Every day | ORAL | Status: DC

## 2015-08-11 MED ORDER — METFORMIN HCL ER 500 MG PO TB24: ORAL_TABLET | ORAL | Status: DC

## 2015-08-11 NOTE — Patient Instructions (Addendum)
Blood sugar in control   Consider  Trying differeent  meds at night to see if help.  Can try  Gabapentin at night can cause drowsiness but only take at night . Can try  to 300 mg  As tolerated  May help  Hot flushes some ( lyrica has also been used)        Insomnia Insomnia is a sleep disorder that makes it difficult to fall asleep or to stay asleep. Insomnia can cause tiredness (fatigue), low energy, difficulty concentrating, mood swings, and poor performance at work or school.  There are three different ways to classify insomnia:  Difficulty falling asleep.  Difficulty staying asleep.  Waking up too early in the morning. Any type of insomnia can be long-term (chronic) or short-term (acute). Both are common. Short-term insomnia usually lasts for three months or less. Chronic insomnia occurs at least three times a week for longer than three months. CAUSES  Insomnia may be caused by another condition, situation, or substance, such as:  Anxiety.  Certain medicines.  Gastroesophageal reflux disease (GERD) or other gastrointestinal conditions.  Asthma or other breathing conditions.  Restless legs syndrome, sleep apnea, or other sleep disorders.  Chronic pain.  Menopause. This may include hot flashes.  Stroke.  Abuse of alcohol, tobacco, or illegal drugs.  Depression.  Caffeine.   Neurological disorders, such as Alzheimer disease.  An overactive thyroid (hyperthyroidism). The cause of insomnia may not be known. RISK FACTORS Risk factors for insomnia include:  Gender. Women are more commonly affected than men.  Age. Insomnia is more common as you get older.  Stress. This may involve your professional or personal life.  Income. Insomnia is more common in people with lower income.  Lack of exercise.   Irregular work schedule or night shifts.  Traveling between different time zones. SIGNS AND SYMPTOMS If you have insomnia, trouble falling asleep or  trouble staying asleep is the main symptom. This may lead to other symptoms, such as:  Feeling fatigued.  Feeling nervous about going to sleep.  Not feeling rested in the morning.  Having trouble concentrating.  Feeling irritable, anxious, or depressed. TREATMENT  Treatment for insomnia depends on the cause. If your insomnia is caused by an underlying condition, treatment will focus on addressing the condition. Treatment may also include:   Medicines to help you sleep.  Counseling or therapy.  Lifestyle adjustments. HOME CARE INSTRUCTIONS   Take medicines only as directed by your health care provider.  Keep regular sleeping and waking hours. Avoid naps.  Keep a sleep diary to help you and your health care provider figure out what could be causing your insomnia. Include:   When you sleep.  When you wake up during the night.  How well you sleep.   How rested you feel the next day.  Any side effects of medicines you are taking.  What you eat and drink.   Make your bedroom a comfortable place where it is easy to fall asleep:  Put up shades or special blackout curtains to block light from outside.  Use a white noise machine to block noise.  Keep the temperature cool.   Exercise regularly as directed by your health care provider. Avoid exercising right before bedtime.  Use relaxation techniques to manage stress. Ask your health care provider to suggest some techniques that may work well for you. These may include:  Breathing exercises.  Routines to release muscle tension.  Visualizing peaceful scenes.  Cut back on alcohol,  caffeinated beverages, and cigarettes, especially close to bedtime. These can disrupt your sleep.  Do not overeat or eat spicy foods right before bedtime. This can lead to digestive discomfort that can make it hard for you to sleep.  Limit screen use before bedtime. This includes:  Watching TV.  Using your smartphone, tablet, and  computer.  Stick to a routine. This can help you fall asleep faster. Try to do a quiet activity, brush your teeth, and go to bed at the same time each night.  Get out of bed if you are still awake after 15 minutes of trying to sleep. Keep the lights down, but try reading or doing a quiet activity. When you feel sleepy, go back to bed.  Make sure that you drive carefully. Avoid driving if you feel very sleepy.  Keep all follow-up appointments as directed by your health care provider. This is important. SEEK MEDICAL CARE IF:   You are tired throughout the day or have trouble in your daily routine due to sleepiness.  You continue to have sleep problems or your sleep problems get worse. SEEK IMMEDIATE MEDICAL CARE IF:   You have serious thoughts about hurting yourself or someone else.   This information is not intended to replace advice given to you by your health care provider. Make sure you discuss any questions you have with your health care provider.   Document Released: 08/12/2000 Document Revised: 05/06/2015 Document Reviewed: 05/16/2014 Else[employer] Interactive Patient Education 2016 [employer]   Restless Legs Syndrome Restless legs syndrome is a condition that causes uncomfortable feelings or sensations in the legs, especially while sitting or lying down. The sensations usually cause an overwhelming urge to move the legs. The arms can also sometimes be affected. The condition can range from mild to severe. The symptoms often interfere with a person's ability to sleep. CAUSES The cause of this condition is not known. RISK FACTORS This condition is more likely to develop in:  People who are older than age 35.  Pregnant women. In general, restless legs syndrome is more common in women than in men.  People who have a family history of the condition.  People who have certain medical conditions, such as iron deficiency, kidney disease, Parkinson disease, or nerve damage.  People  who take certain medicines, such as medicines for high blood pressure, nausea, colds, allergies, depression, and some heart conditions. SYMPTOMS The main symptom of this condition is uncomfortable sensations in the legs. These sensations may be:  Described as pulling, tingling, prickling, throbbing, crawling, or burning.  Worse while you are sitting or lying down.  Worse during periods of rest or inactivity.  Worse at night, often interfering with your sleep.  Accompanied by a very strong urge to move your legs.  Temporarily relieved by movement of your legs. The sensations usually affect both sides of the body. The arms can also be affected, but this is rare. People who have this condition often have tiredness during the day because of their lack of sleep at night. DIAGNOSIS This condition may be diagnosed based on your description of the symptoms. You may also have tests, including blood tests, to check for other conditions that may lead to your symptoms. In some cases, you may be asked to spend some time in a sleep lab so your sleeping can be monitored. TREATMENT Treatment for this condition is focused on managing the symptoms. Treatment may include:  Self-help and lifestyle changes.  Medicines. HOME CARE INSTRUCTIONS  Take  medicines only as directed by your health care provider.  Try these methods to get temporary relief from the uncomfortable sensations:  Massage your legs.  Walk or stretch.  Take a cold or hot bath.  Practice good sleep habits. For example, go to bed and get up at the same time every day.  Exercise regularly.  Practice ways of relaxing, such as yoga or meditation.  Avoid caffeine and alcohol.  Do not use any tobacco products, including cigarettes, chewing tobacco, or electronic cigarettes. If you need help quitting, ask your health care provider.  Keep all follow-up visits as directed by your health care provider. This is important. SEEK MEDICAL  CARE IF: Your symptoms do not improve with treatment, or they get worse.   This information is not intended to replace advice given to you by your health care provider. Make sure you discuss any questions you have with your health care provider.   Document Released: 08/05/2002 Document Revised: 12/30/2014 Document Reviewed: 08/11/2014 [employer] Interactive Patient Education [employer]

## 2015-08-11 NOTE — Progress Notes (Signed)
Pre visit review using our clinic review tool, if applicable. No additional management support is needed unless otherwise documented below in the visit note.  Chief Complaint  Patient presents with  . Medicare Wellness    sleep issues  . Diabetes  . Hyperlipidemia  . Hypertension    HPI: Kerry Walters 69 y.o. comes in today for Preventive Medicare wellness visit . And Chronic disease managementcpx Since last visit. BG ok  On metformin  No se of med noted  BPdoesnt check takes med  LIPIDS  Take meds needs refill Concern about sleep never great  But cant go back to sleep  Falls asleep with tv on .   Health Maintenance  Topic Date Due  . Hepatitis C Screening  [birth year]  . DEXA SCAN  02/14/2009  . COLONOSCOPY  12/22/2014  . HEMOGLOBIN A1C  02/02/2016  . FOOT EXAM  02/23/2016  . URINE MICROALBUMIN  02/23/2016  . OPHTHALMOLOGY EXAM  03/05/2016  . INFLUENZA VACCINE  03/29/2016  . MAMMOGRAM  03/26/2017  . TETANUS/TDAP  11/23/2024  . ZOSTAVAX  Completed  . PNA vac Low Risk Adult  Completed   Health Maintenance Review LIFESTYLE:  TAD neg  Sugar beverages:ocass Sleep: hard to go back to sleep after upto void 4 am etc     MEDICARE DOCUMENT QUESTIONS  TO SCAN     Hearing: ok  Vision:  No limitations at present . Last eye check UTD no retinopathy  Safety:  Has smoke detector and wears seat belts.  No firearms. No excess sun exposure. Sees dentist regularly.  Falls: n  Advance directive :  Reviewed  Has one.  Memory: Felt to be good  , no concern from her or her family.  Depression: No anhedonia unusual crying or depressive symptoms  Nutrition: Eats well balanced diet; adequate calcium and vitamin D. No swallowing chewing problems.  Injury: no major injuries in the last six months.  Other healthcare providers:  Reviewed today .  Social:  Lives with spouse married. No pets.   Preventive parameters: up-to-date  Reviewed   ADLS:   There are no problems or need  for assistance  driving, feeding, obtaining food, dressing, toileting and bathing, managing money using phone. She is independent.    ROS:  GEN/ HEENT: No fever, significant weight changes sweats headaches vision problems hearing changes, CV/ PULM; No chest pain shortness of breath cough, syncope,edema  change in exercise tolerance. GI /GU: No adominal pain, vomiting, change in bowel habits. No blood in the stool. No significant GU symptoms. SKIN/HEME: ,no acute skin rashes suspicious lesions or bleeding. No lymphadenopathy, nodules, masses.  NEURO/ PSYCH:  No neurologic signs such as weakness numbness. No depression anxiety. IMM/ Allergy: No unusual infections.  Allergy .   REST of 12 system review negative except as per HPI   Past Medical History  Diagnosis Date  . Hyperlipidemia   . Hypertension   . Nephrolithiasis     RIGHT   . GERD (gastroesophageal reflux disease)   . History of esophageal dilatation     FOR STRICTURE IN 2005  . History of kidney stones   . History of recurrent UTIs   . Pre-diabetes   . History of hyperparathyroidism     PRIMARY--  S/P RIGHT PARATHYROIDECTOMY  . History of hypercalcemia     SECONDARY TO HYPERPARATHYROID  . Hypothyroidism   . Diverticulosis of colon   . Incomplete right bundle branch block   . PONV (postoperative nausea and vomiting)   .  Asthma due to environmental allergies     no inhaler  . History of hiatal hernia   . Sciatica of right side   . Nocturia     Family History  Problem Relation Age of Onset  . Cancer Son     liver  . Cancer Father     brain  . Diabetes Sister   . Schizophrenia Son     Social History   Social History  . Marital Status: Married    Spouse Name: N/A  . Number of Children: N/A  . Years of Education: N/A   Social History Main Topics  . Smoking status: Never Smoker   . Smokeless tobacco: Never Used  . Alcohol Use: No  . Drug Use: No  . Sexual Activity: Not Asked   Other Topics Concern  .  None   Social History Narrative   Married   hh of 2   4 dogs   G2 P2   Water exercise.   Retired    [employer]    great grandchild              Outpatient Encounter Prescriptions as of 08/11/2015  Medication Sig  . atorvastatin (LIPITOR) 20 MG tablet Take 1 tablet (20 mg total) by mouth every evening.  [name]. levothyroxine (SYNTHROID, LEVOTHROID) 75 MCG tablet TAKE 1 TABLET DAILY  . metFORMIN (GLUCOPHAGE-XR) 500 MG 24 hr tablet Take 3 tablets a day.  . pantoprazole (PROTONIX) 40 MG tablet Take 1 tablet (40 mg total) by mouth daily.  [name]. PREMARIN vaginal cream Place 1 Applicatorful vaginally as directed. Use 2-3 times weekly  prn  . triamterene-hydrochlorothiazide (MAXZIDE-25) 37.5-25 MG tablet Take 1 tablet by mouth daily.  . [DISCONTINUED] atorvastatin (LIPITOR) 20 MG tablet Take 1 tablet (20 mg total) by mouth every evening.  . [DISCONTINUED] levothyroxine (SYNTHROID, LEVOTHROID) 75 MCG tablet TAKE 1 TABLET DAILY  . [DISCONTINUED] metFORMIN (GLUCOPHAGE-XR) 500 MG 24 hr tablet Take 3 tablets a day.  . [DISCONTINUED] pantoprazole (PROTONIX) 40 MG tablet Take 1 tablet (40 mg total) by mouth daily.  . [DISCONTINUED] triamterene-hydrochlorothiazide (MAXZIDE-25) 37.5-25 MG tablet Take 1 tablet by mouth daily.  [name]. gabapentin (NEURONTIN) 100 MG capsule Take 1-3 capsules (100-300 mg total) by mouth at bedtime. asdirected  . [DISCONTINUED] cephALEXin (KEFLEX) 500 MG capsule Take 1 capsule (500 mg total) by mouth 2 (two) times daily.   No facility-administered encounter medications on file as of 08/11/2015.    EXAM:  BP 142/70 mmHg  Temp(Src) 97.4 F (36.3 C) (Oral)  Ht 5' 2.75" (1.594 m)  Wt 177 lb 9.6 oz (80.559 kg)  BMI 31.71 kg/m2  Body mass index is 31.71 kg/(m^2).  Physical Exam: Vital signs reviewed NWG:NFAOGEN:This is a well-developed well-nourished alert cooperative   who appears stated age in no acute distress.  HEENT: normocephalic atraumatic , Eyes: PERRL EOM's full, conjunctiva  clear, Nares: paten,t no deformity discharge or tenderness., Ears: no deformity EAC's clear TMs with normal landmarks. Mouth: clear OP, no lesions, edema.  Moist mucous membranes. Dentition in adequate repair. NECK: supple without masses, thyromegaly or bruits. CHEST/PULM:  Clear to auscultation and percussion breath sounds equal no wheeze , rales or rhonchi. No chest wall deformities or tenderness. br no nodules or dc  CV: PMI is nondisplaced, S1 S2 no gallops, murmurs, rubs. Peripheral pulses are full without delay.No JVD .  ABDOMEN: Bowel sounds normal nontender  No guard or rebound, no hepato splenomegal no CVA tenderness.   Extremtities:  No clubbing cyanosis  or edema, no acute joint swelling or redness no focal atrophy NEURO:  Oriented x3, cranial nerves 3-12 appear to be intact, no obvious focal weakness,gait within normal limits no abnormal reflexes or asymmetrical SKIN: No acute rashes normal turgor, color, no bruising or petechiae. PSYCH: Oriented, good eye contact, no obvious depression anxiety, cognition and judgment appear normal. LN: no cervical axillary inguinal adenopathy No noted deficits in memory, attention, and speech.   Lab Results  Component Value Date   WBC 8.4 08/04/2015   HGB 12.9 08/04/2015   HCT 39.5 08/04/2015   PLT 275.0 08/04/2015   GLUCOSE 145* 08/04/2015   CHOL 138 08/04/2015   TRIG 88.0 08/04/2015   HDL 35.50* 08/04/2015   LDLCALC 85 08/04/2015   ALT 28 08/04/2015   AST 23 08/04/2015   NA 144 08/04/2015   K 4.8 08/04/2015   CL 106 08/04/2015   CREATININE 1.00 08/04/2015   BUN 18 08/04/2015   CO2 31 08/04/2015   TSH 3.39 08/04/2015   INR 1.12 11/19/2009   HGBA1C 6.9* 08/04/2015   MICROALBUR 1.2 02/23/2015    ASSESSMENT AND PLAN:  Discussed the following assessment and plan:  Visit for preventive health examination  Medicare annual wellness visit, subsequent  Diabetes mellitus without complication (HCC)  Essential  hypertension  Hyperlipidemia  Nocturnal leg movements - poss rls vs other neg fam hx can try gabapentin 100 - 300 hx  lsi alos advised   Medication management  Need for 23-polyvalent pneumococcal polysaccharide vaccine - Plan: Pneumococcal polysaccharide vaccine 23-valent greater than or equal to 2yo subcutaneous/IM  Insomnia - cannot go back to sleep after up to go to br denies osa . counseled   If needed augmntation do arb acei to diuretic  Unless hx of se .   Better bp when resting   utd on eye exam  Patient Care Team: Madelin Headings, MD as PCP - General Valeria Batman, MD (Orthopedic Surgery) Cherlyn Roberts, MD (Dermatology) Ernesto Rutherford, MD (Ophthalmology) Su Grand, MD as Consulting Physician (Urology) Dr Leana Roe chiropractor   Patient Instructions  Blood sugar in control   Consider  Trying differeent  meds at night to see if help.  Can try  Gabapentin at night can cause drowsiness but only take at night . Can try 100mg  to 300 mg  As tolerated  May help  Hot flushes some ( lyrica has also been used)        Insomnia Insomnia is a sleep disorder that makes it difficult to fall asleep or to stay asleep. Insomnia can cause tiredness (fatigue), low energy, difficulty concentrating, mood swings, and poor performance at work or school.  There are three different ways to classify insomnia:  Difficulty falling asleep.  Difficulty staying asleep.  Waking up too early in the morning. Any type of insomnia can be long-term (chronic) or short-term (acute). Both are common. Short-term insomnia usually lasts for three months or less. Chronic insomnia occurs at least three times a week for longer than three months. CAUSES  Insomnia may be caused by another condition, situation, or substance, such as:  Anxiety.  Certain medicines.  Gastroesophageal reflux disease (GERD) or other gastrointestinal conditions.  Asthma or other breathing conditions.  Restless legs  syndrome, sleep apnea, or other sleep disorders.  Chronic pain.  Menopause. This may include hot flashes.  Stroke.  Abuse of alcohol, tobacco, or illegal drugs.  Depression.  Caffeine.   Neurological disorders, such as Alzheimer disease.  An overactive thyroid (hyperthyroidism).  The cause of insomnia may not be known. RISK FACTORS Risk factors for insomnia include:  Gender. Women are more commonly affected than men.  Age. Insomnia is more common as you get older.  Stress. This may involve your professional or personal life.  Income. Insomnia is more common in people with lower income.  Lack of exercise.   Irregular work schedule or night shifts.  Traveling between different time zones. SIGNS AND SYMPTOMS If you have insomnia, trouble falling asleep or trouble staying asleep is the main symptom. This may lead to other symptoms, such as:  Feeling fatigued.  Feeling nervous about going to sleep.  Not feeling rested in the morning.  Having trouble concentrating.  Feeling irritable, anxious, or depressed. TREATMENT  Treatment for insomnia depends on the cause. If your insomnia is caused by an underlying condition, treatment will focus on addressing the condition. Treatment may also include:   Medicines to help you sleep.  Counseling or therapy.  Lifestyle adjustments. HOME CARE INSTRUCTIONS   Take medicines only as directed by your health care provider.  Keep regular sleeping and waking hours. Avoid naps.  Keep a sleep diary to help you and your health care provider figure out what could be causing your insomnia. Include:   When you sleep.  When you wake up during the night.  How well you sleep.   How rested you feel the next day.  Any side effects of medicines you are taking.  What you eat and drink.   Make your bedroom a comfortable place where it is easy to fall asleep:  Put up shades or special blackout curtains to block light from  outside.  Use a white noise machine to block noise.  Keep the temperature cool.   Exercise regularly as directed by your health care provider. Avoid exercising right before bedtime.  Use relaxation techniques to manage stress. Ask your health care provider to suggest some techniques that may work well for you. These may include:  Breathing exercises.  Routines to release muscle tension.  Visualizing peaceful scenes.  Cut back on alcohol, caffeinated beverages, and cigarettes, especially close to bedtime. These can disrupt your sleep.  Do not overeat or eat spicy foods right before bedtime. This can lead to digestive discomfort that can make it hard for you to sleep.  Limit screen use before bedtime. This includes:  Watching TV.  Using your smartphone, tablet, and computer.  Stick to a routine. This can help you fall asleep faster. Try to do a quiet activity, brush your teeth, and go to bed at the same time each night.  Get out of bed if you are still awake after 15 minutes of trying to sleep. Keep the lights down, but try reading or doing a quiet activity. When you feel sleepy, go back to bed.  Make sure that you drive carefully. Avoid driving if you feel very sleepy.  Keep all follow-up appointments as directed by your health care provider. This is important. SEEK MEDICAL CARE IF:   You are tired throughout the day or have trouble in your daily routine due to sleepiness.  You continue to have sleep problems or your sleep problems get worse. SEEK IMMEDIATE MEDICAL CARE IF:   You have serious thoughts about hurting yourself or someone else.   This information is not intended to replace advice given to you by your health care provider. Make sure you discuss any questions you have with your health care provider.   Document Released:  08/12/2000 Document Revised: 05/06/2015 Document Reviewed: 05/16/2014 Elsevier Interactive Patient Education 2016 [employer]   Restless  Legs Syndrome Restless legs syndrome is a condition that causes uncomfortable feelings or sensations in the legs, especially while sitting or lying down. The sensations usually cause an overwhelming urge to move the legs. The arms can also sometimes be affected. The condition can range from mild to severe. The symptoms often interfere with a person's ability to sleep. CAUSES The cause of this condition is not known. RISK FACTORS This condition is more likely to develop in:  People who are older than age 33.  Pregnant women. In general, restless legs syndrome is more common in women than in men.  People who have a family history of the condition.  People who have certain medical conditions, such as iron deficiency, kidney disease, Parkinson disease, or nerve damage.  People who take certain medicines, such as medicines for high blood pressure, nausea, colds, allergies, depression, and some heart conditions. SYMPTOMS The main symptom of this condition is uncomfortable sensations in the legs. These sensations may be:  Described as pulling, tingling, prickling, throbbing, crawling, or burning.  Worse while you are sitting or lying down.  Worse during periods of rest or inactivity.  Worse at night, often interfering with your sleep.  Accompanied by a very strong urge to move your legs.  Temporarily relieved by movement of your legs. The sensations usually affect both sides of the body. The arms can also be affected, but this is rare. People who have this condition often have tiredness during the day because of their lack of sleep at night. DIAGNOSIS This condition may be diagnosed based on your description of the symptoms. You may also have tests, including blood tests, to check for other conditions that may lead to your symptoms. In some cases, you may be asked to spend some time in a sleep lab so your sleeping can be monitored. TREATMENT Treatment for this condition is focused on  managing the symptoms. Treatment may include:  Self-help and lifestyle changes.  Medicines. HOME CARE INSTRUCTIONS  Take medicines only as directed by your health care provider.  Try these methods to get temporary relief from the uncomfortable sensations:  Massage your legs.  Walk or stretch.  Take a cold or hot bath.  Practice good sleep habits. For example, go to bed and get up at the same time every day.  Exercise regularly.  Practice ways of relaxing, such as yoga or meditation.  Avoid caffeine and alcohol.  Do not use any tobacco products, including cigarettes, chewing tobacco, or electronic cigarettes. If you need help quitting, ask your health care provider.  Keep all follow-up visits as directed by your health care provider. This is important. SEEK MEDICAL CARE IF: Your symptoms do not improve with treatment, or they get worse.   This information is not intended to replace advice given to you by your health care provider. Make sure you discuss any questions you have with your health care provider.   Document Released: 08/05/2002 Document Revised: 12/30/2014 Document Reviewed: 08/11/2014 Elsevier Interactive Patient Education 2016 [employer].     [place].  M.D.

## 2015-09-01 DIAGNOSIS — M9905 Segmental and somatic dysfunction of pelvic region: Secondary | ICD-10-CM | POA: Diagnosis not present

## 2015-09-01 DIAGNOSIS — M9904 Segmental and somatic dysfunction of sacral region: Secondary | ICD-10-CM | POA: Diagnosis not present

## 2015-09-01 DIAGNOSIS — M9903 Segmental and somatic dysfunction of lumbar region: Secondary | ICD-10-CM | POA: Diagnosis not present

## 2015-09-01 DIAGNOSIS — M5136 Other intervertebral disc degeneration, lumbar region: Secondary | ICD-10-CM | POA: Diagnosis not present

## 2015-09-09 DIAGNOSIS — M9903 Segmental and somatic dysfunction of lumbar region: Secondary | ICD-10-CM | POA: Diagnosis not present

## 2015-09-09 DIAGNOSIS — M9904 Segmental and somatic dysfunction of sacral region: Secondary | ICD-10-CM | POA: Diagnosis not present

## 2015-09-09 DIAGNOSIS — M5136 Other intervertebral disc degeneration, lumbar region: Secondary | ICD-10-CM | POA: Diagnosis not present

## 2015-09-09 DIAGNOSIS — M9905 Segmental and somatic dysfunction of pelvic region: Secondary | ICD-10-CM | POA: Diagnosis not present

## 2015-09-28 DIAGNOSIS — M5136 Other intervertebral disc degeneration, lumbar region: Secondary | ICD-10-CM | POA: Diagnosis not present

## 2015-09-28 DIAGNOSIS — M9903 Segmental and somatic dysfunction of lumbar region: Secondary | ICD-10-CM | POA: Diagnosis not present

## 2015-09-28 DIAGNOSIS — M9905 Segmental and somatic dysfunction of pelvic region: Secondary | ICD-10-CM | POA: Diagnosis not present

## 2015-09-28 DIAGNOSIS — M9904 Segmental and somatic dysfunction of sacral region: Secondary | ICD-10-CM | POA: Diagnosis not present

## 2015-10-07 ENCOUNTER — Telehealth: Payer: Self-pay | Admitting: Internal Medicine

## 2015-10-07 NOTE — Telephone Encounter (Signed)
error 

## 2015-10-09 ENCOUNTER — Encounter: Payer: Self-pay | Admitting: Internal Medicine

## 2015-10-09 ENCOUNTER — Ambulatory Visit (INDEPENDENT_AMBULATORY_CARE_PROVIDER_SITE_OTHER): Payer: Medicare Other | Admitting: Internal Medicine

## 2015-10-09 VITALS — BP 134/74 | Temp 98.4°F | Ht 62.75 in | Wt 178.0 lb

## 2015-10-09 DIAGNOSIS — R3915 Urgency of urination: Secondary | ICD-10-CM

## 2015-10-09 DIAGNOSIS — R300 Dysuria: Secondary | ICD-10-CM

## 2015-10-09 DIAGNOSIS — N390 Urinary tract infection, site not specified: Secondary | ICD-10-CM | POA: Diagnosis not present

## 2015-10-09 LAB — POC URINALSYSI DIPSTICK (AUTOMATED)
Bilirubin, UA: NEGATIVE
GLUCOSE UA: NEGATIVE
Ketones, UA: NEGATIVE
NITRITE UA: POSITIVE
Protein, UA: NEGATIVE
RBC UA: NEGATIVE
Spec Grav, UA: 1.015
UROBILINOGEN UA: 0.2
pH, UA: 6

## 2015-10-09 MED ORDER — CEPHALEXIN 500 MG PO CAPS: 500.0000 mg | ORAL_CAPSULE | Freq: Three times a day (TID) | ORAL | Status: DC

## 2015-10-09 NOTE — Patient Instructions (Addendum)
You have uti . Take antibiotic and will let you know culture . contact us if not getting better .

## 2015-10-09 NOTE — Progress Notes (Signed)
Pre visit review using our clinic review tool, if applicable. No additional management support is needed unless otherwise documented below in the visit note.  Chief Complaint  Patient presents with  . Dysuria    X3wks  . Urinary Urgency  . Foul Urine Odor    HPI: Patient Kerry Walters  comes in today for SDA for  new problem evaluation. Onset 3 weeks ago   Had flu like illness and  Self rx.  .  Cough and stomach   Got worse .  The better    thendeveloped   uti sx  Later   In past week  . Go worse.  No recent fever chills . No blood  No flank pain  Some nausea and not feeling well  Last uti 9 16 ROS: See pertinent positives and negatives per HPI.  Past Medical History  Diagnosis Date  . Hyperlipidemia   . Hypertension   . Nephrolithiasis     RIGHT   . GERD (gastroesophageal reflux disease)   . History of esophageal dilatation     FOR STRICTURE IN 2005  . History of kidney stones   . History of recurrent UTIs   . Pre-diabetes   . History of hyperparathyroidism     PRIMARY--  S/P RIGHT PARATHYROIDECTOMY  . History of hypercalcemia     SECONDARY TO HYPERPARATHYROID  . Hypothyroidism   . Diverticulosis of colon   . Incomplete right bundle branch block   . PONV (postoperative nausea and vomiting)   . Asthma due to environmental allergies     no inhaler  . History of hiatal hernia   . Sciatica of right side   . Nocturia     Family History  Problem Relation Age of Onset  . Cancer Son     liver  . Cancer Father     brain  . Diabetes Sister   . Schizophrenia Son     Social History   Social History  . Marital Status: Married    Spouse Name: N/A  . Number of Children: N/A  . Years of Education: N/A   Social History Main Topics  . Smoking status: Never Smoker   . Smokeless tobacco: Never Used  . Alcohol Use: No  . Drug Use: No  . Sexual Activity: Not Asked   Other Topics Concern  . None   Social History Narrative   Married   hh of 2   4 dogs   G2 P2     Water exercise.   Retired    [employer]    great grandchild              Outpatient Prescriptions Prior to Visit  Medication Sig Dispense Refill  . atorvastatin (LIPITOR) 20 MG tablet Take 1 tablet (20 mg total) by mouth every evening. 90 tablet 3  . gabapentin (NEURONTIN) 100 MG capsule Take 1-3 capsules (100-300 mg total) by mouth at bedtime. asdirected 90 capsule 1  . levothyroxine (SYNTHROID, LEVOTHROID) 75 MCG tablet TAKE 1 TABLET DAILY 90 tablet 3  . metFORMIN (GLUCOPHAGE-XR) 500 MG 24 hr tablet Take 3 tablets a day. 270 tablet 3  . pantoprazole (PROTONIX) 40 MG tablet Take 1 tablet (40 mg total) by mouth daily. 90 tablet 3  . PREMARIN vaginal cream Place 1 Applicatorful vaginally as directed. Use 2-3 times weekly  prn    . triamterene-hydrochlorothiazide (MAXZIDE-25) 37.5-25 MG tablet Take 1 tablet by mouth daily. 90 tablet 3   No facility-administered medications prior  to visit.     EXAM:  BP 134/74 mmHg  Temp(Src) 98.4 F (36.9 C) (Oral)  Ht 5' 2.75" (1.594 m)  Wt 178 lb (80.74 kg)  BMI 31.78 kg/m2  Body mass index is 31.78 kg/(m^2).  GENERAL: vitals reviewed and listed above, alert, oriented, appears well hydrated and in no acute distress HEENT: atraumatic, conjunctiva  clear, no obvious abnormalities on inspection of external nose and ears  No cva pain  MS: moves all extremities without noticeable focal  abnormality PSYCH: pleasant and cooperative, no obvious depression or anxiety ua 3+ leuk  ASSESSMENT AND PLAN:  Discussed the following assessment and plan:  Urinary tract infection, site not specified  Dysuria - Plan: POCT Urinalysis Dipstick (Automated), Urine culture  Urinary urgency - Plan: POCT Urinalysis Dipstick (Automated), Urine culture Hx of  Stones  No evidence today  rx and fu if  persistent or progressive  -Patient advised to return or notify health care team  if symptoms worsen ,persist or new concerns arise.  Patient Instructions  You have  uti . Take antibiotic and will let you know culture . contact us if not getting better .      Neta Mends. Panosh M.D.

## 2015-10-12 DIAGNOSIS — M9903 Segmental and somatic dysfunction of lumbar region: Secondary | ICD-10-CM | POA: Diagnosis not present

## 2015-10-12 DIAGNOSIS — M5136 Other intervertebral disc degeneration, lumbar region: Secondary | ICD-10-CM | POA: Diagnosis not present

## 2015-10-12 DIAGNOSIS — M9905 Segmental and somatic dysfunction of pelvic region: Secondary | ICD-10-CM | POA: Diagnosis not present

## 2015-10-12 DIAGNOSIS — M9904 Segmental and somatic dysfunction of sacral region: Secondary | ICD-10-CM | POA: Diagnosis not present

## 2015-10-12 LAB — URINE CULTURE: Colony Count: >=100000

## 2015-10-12 NOTE — Progress Notes (Signed)
Quick Note:  Tell patient that urine culture shows e coli sensitive to medication given . Should resolve with current treatment .FU if not better. ______ 

## 2015-10-30 DIAGNOSIS — M9903 Segmental and somatic dysfunction of lumbar region: Secondary | ICD-10-CM | POA: Diagnosis not present

## 2015-10-30 DIAGNOSIS — M5136 Other intervertebral disc degeneration, lumbar region: Secondary | ICD-10-CM | POA: Diagnosis not present

## 2015-10-30 DIAGNOSIS — M9904 Segmental and somatic dysfunction of sacral region: Secondary | ICD-10-CM | POA: Diagnosis not present

## 2015-10-30 DIAGNOSIS — M9905 Segmental and somatic dysfunction of pelvic region: Secondary | ICD-10-CM | POA: Diagnosis not present

## 2015-11-05 DIAGNOSIS — Z0000 Encounter for general adult medical examination without abnormal findings: Secondary | ICD-10-CM | POA: Diagnosis not present

## 2015-11-05 DIAGNOSIS — N200 Calculus of kidney: Secondary | ICD-10-CM | POA: Diagnosis not present

## 2015-11-09 DIAGNOSIS — M9904 Segmental and somatic dysfunction of sacral region: Secondary | ICD-10-CM | POA: Diagnosis not present

## 2015-11-09 DIAGNOSIS — M9905 Segmental and somatic dysfunction of pelvic region: Secondary | ICD-10-CM | POA: Diagnosis not present

## 2015-11-09 DIAGNOSIS — M5136 Other intervertebral disc degeneration, lumbar region: Secondary | ICD-10-CM | POA: Diagnosis not present

## 2015-11-09 DIAGNOSIS — M9903 Segmental and somatic dysfunction of lumbar region: Secondary | ICD-10-CM | POA: Diagnosis not present

## 2015-11-19 DIAGNOSIS — M5136 Other intervertebral disc degeneration, lumbar region: Secondary | ICD-10-CM | POA: Diagnosis not present

## 2015-11-19 DIAGNOSIS — M9905 Segmental and somatic dysfunction of pelvic region: Secondary | ICD-10-CM | POA: Diagnosis not present

## 2015-11-19 DIAGNOSIS — M9904 Segmental and somatic dysfunction of sacral region: Secondary | ICD-10-CM | POA: Diagnosis not present

## 2015-11-19 DIAGNOSIS — M9903 Segmental and somatic dysfunction of lumbar region: Secondary | ICD-10-CM | POA: Diagnosis not present

## 2015-11-23 DIAGNOSIS — M9903 Segmental and somatic dysfunction of lumbar region: Secondary | ICD-10-CM | POA: Diagnosis not present

## 2015-11-23 DIAGNOSIS — M5136 Other intervertebral disc degeneration, lumbar region: Secondary | ICD-10-CM | POA: Diagnosis not present

## 2015-11-23 DIAGNOSIS — M9905 Segmental and somatic dysfunction of pelvic region: Secondary | ICD-10-CM | POA: Diagnosis not present

## 2015-11-23 DIAGNOSIS — M9904 Segmental and somatic dysfunction of sacral region: Secondary | ICD-10-CM | POA: Diagnosis not present

## 2015-11-26 DIAGNOSIS — M5136 Other intervertebral disc degeneration, lumbar region: Secondary | ICD-10-CM | POA: Diagnosis not present

## 2015-11-26 DIAGNOSIS — M9905 Segmental and somatic dysfunction of pelvic region: Secondary | ICD-10-CM | POA: Diagnosis not present

## 2015-11-26 DIAGNOSIS — M9904 Segmental and somatic dysfunction of sacral region: Secondary | ICD-10-CM | POA: Diagnosis not present

## 2015-11-26 DIAGNOSIS — M9903 Segmental and somatic dysfunction of lumbar region: Secondary | ICD-10-CM | POA: Diagnosis not present

## 2015-12-03 ENCOUNTER — Other Ambulatory Visit: Payer: Medicare Other

## 2015-12-10 ENCOUNTER — Ambulatory Visit: Payer: Medicare Other | Admitting: Internal Medicine

## 2015-12-10 ENCOUNTER — Other Ambulatory Visit (INDEPENDENT_AMBULATORY_CARE_PROVIDER_SITE_OTHER): Payer: Medicare Other

## 2015-12-10 DIAGNOSIS — E119 Type 2 diabetes mellitus without complications: Secondary | ICD-10-CM

## 2015-12-10 DIAGNOSIS — I10 Essential (primary) hypertension: Secondary | ICD-10-CM | POA: Diagnosis not present

## 2015-12-10 LAB — HEMOGLOBIN A1C: HEMOGLOBIN A1C: 6.9 % — AB (ref 4.6–6.5)

## 2015-12-10 LAB — BASIC METABOLIC PANEL
BUN: 19 mg/dL (ref 6–23)
CO2: 31 mEq/L (ref 19–32)
Calcium: 9.9 mg/dL (ref 8.4–10.5)
Chloride: 103 mEq/L (ref 96–112)
Creatinine, Ser: 0.97 mg/dL (ref 0.40–1.20)
GFR: 60.03 mL/min (ref >60.00)
Glucose, Bld: 136 mg/dL — ABNORMAL HIGH (ref 70–99)
POTASSIUM: 4 meq/L (ref 3.5–5.1)
SODIUM: 142 meq/L (ref 135–145)

## 2015-12-16 NOTE — Progress Notes (Signed)
Pre visit review using our clinic review tool, if applicable. No additional management support is needed unless otherwise documented below in the visit note.  Chief Complaint  Patient presents with  . Follow-up    HPI: Kerry Walters 70 y.o.  comesin for Chronic disease management DM  3 pills   Metformin per day no se doing well .  LEGS neurontin helps some    Takes if bad night .  No every night  Not a whole lot of sleep .  7 hours but feels good   Stress   per[place] out of house in own apt and feels good  Lipids no se taking  ROS: See pertinent positives and negatives per HPI. No cp sob new numbness major vision changes  Feels less stressed and well  Past Medical History  Diagnosis Date  . Hyperlipidemia   . Hypertension   . Nephrolithiasis     RIGHT   . GERD (gastroesophageal reflux disease)   . History of esophageal dilatation     FOR STRICTURE IN 2005  . History of kidney stones   . History of recurrent UTIs   . Pre-diabetes   . History of hyperparathyroidism     PRIMARY--  S/P RIGHT PARATHYROIDECTOMY  . History of hypercalcemia     SECONDARY TO HYPERPARATHYROID  . Hypothyroidism   . Diverticulosis of colon   . Incomplete right bundle branch block   . PONV (postoperative nausea and vomiting)   . Asthma due to environmental allergies     no inhaler  . History of hiatal hernia   . Sciatica of right side   . Nocturia     Family History  Problem Relation Age of Onset  . Cancer Son     liver  . Cancer Father     brain  . Diabetes Sister   . Schizophrenia Son     Social History   Social History  . Marital Status: Married    Spouse Name: N/A  . Number of Children: N/A  . Years of Education: N/A   Social History Main Topics  . Smoking status: Never Smoker   . Smokeless tobacco: Never Used  . Alcohol Use: No  . Drug Use: No  . Sexual Activity: Not Asked   Other Topics Concern  . None   Social History Narrative   Married   hh of 2   4 dogs   G2 P2   Water exercise.   Retired    [employer]    great grandchild              Outpatient Prescriptions Prior to Visit  Medication Sig Dispense Refill  . atorvastatin (LIPITOR) 20 MG tablet Take 1 tablet (20 mg total) by mouth every evening. 90 tablet 3  . levothyroxine (SYNTHROID, LEVOTHROID) 75 MCG tablet TAKE 1 TABLET DAILY 90 tablet 3  . metFORMIN (GLUCOPHAGE-XR) 500 MG 24 hr tablet Take 3 tablets a day. 270 tablet 3  . pantoprazole (PROTONIX) 40 MG tablet Take 1 tablet (40 mg total) by mouth daily. 90 tablet 3  . PREMARIN vaginal cream Place 1 Applicatorful vaginally as directed. Use 2-3 times weekly  prn    . triamterene-hydrochlorothiazide (MAXZIDE-25) 37.5-25 MG tablet Take 1 tablet by mouth daily. 90 tablet 3  . gabapentin (NEURONTIN) 100 MG capsule Take 1-3 capsules (100-300 mg total) by mouth at bedtime. asdirected 90 capsule 1  . cephALEXin (KEFLEX) 500 MG capsule Take 1 capsule (500 mg total) by mouth 3 (  three) times daily. 15 capsule 0   No facility-administered medications prior to visit.     EXAM:  BP 130/70 mmHg  Temp(Src) 98.4 F (36.9 C) (Oral)  Ht 5' 2.75" (1.594 m)  Wt 178 lb 8 oz (80.967 kg)  BMI 31.87 kg/m2  Body mass index is 31.87 kg/(m^2).  GENERAL: vitals reviewed and listed above, alert, oriented, appears well hydrated and in no acute distress  MS: moves all extremities without noticeable focal  abnormality PSYCH: pleasant and cooperative, no obvious depression or anxiety Lab Results  Component Value Date   WBC 8.4 08/04/2015   HGB 12.9 08/04/2015   HCT 39.5 08/04/2015   PLT 275.0 08/04/2015   GLUCOSE 136* 12/10/2015   CHOL 138 08/04/2015   TRIG 88.0 08/04/2015   HDL 35.50* 08/04/2015   LDLCALC 85 08/04/2015   ALT 28 08/04/2015   AST 23 08/04/2015   NA 142 12/10/2015   K 4.0 12/10/2015   CL 103 12/10/2015   CREATININE 0.97 12/10/2015   BUN 19 12/10/2015   CO2 31 12/10/2015   TSH 3.39 08/04/2015   INR 1.12 11/19/2009   HGBA1C 6.9*  12/10/2015   MICROALBUR 1.2 02/23/2015   BP Readings from Last 3 Encounters:  12/17/15 130/70  10/09/15 134/74  08/11/15 142/70   Wt Readings from Last 3 Encounters:  12/17/15 178 lb 8 oz (80.967 kg)  10/09/15 178 lb (80.74 kg)  08/11/15 177 lb 9.6 oz (80.559 kg)    ASSESSMENT AND PLAN:  Discussed the following assessment and plan:  Diabetes mellitus without complication (HCC) - doing well less stress stable check uirn protein today 6 months cpx labs etc  - Plan: Microalbumin / creatinine urine ratio  Essential hypertension - doing so well that unless proteinuria will  stay on same regimen  acei option disc w p   Nocturnal leg movements - gabapentin helping taking 1-2 as neededusually about 3 x per week .  Ok to refill med for now  -Patient advised to return or notify health care team  if symptoms worsen ,persist or new concerns arise.  Patient Instructions  Continue lifestyle intervention healthy eating and exercise . Labs are good .  Check urine for protein  Today .  Ok to use the gabapentin as needed .   Wellness physical  With labs in 6 months    Neta Mends.  M.D.

## 2015-12-17 ENCOUNTER — Encounter: Payer: Self-pay | Admitting: Internal Medicine

## 2015-12-17 ENCOUNTER — Ambulatory Visit (INDEPENDENT_AMBULATORY_CARE_PROVIDER_SITE_OTHER): Payer: Medicare Other | Admitting: Internal Medicine

## 2015-12-17 VITALS — BP 130/70 | Temp 98.4°F | Ht 62.75 in | Wt 178.5 lb

## 2015-12-17 DIAGNOSIS — R258 Other abnormal involuntary movements: Secondary | ICD-10-CM

## 2015-12-17 DIAGNOSIS — I10 Essential (primary) hypertension: Secondary | ICD-10-CM | POA: Diagnosis not present

## 2015-12-17 DIAGNOSIS — Z79899 Other long term (current) drug therapy: Secondary | ICD-10-CM

## 2015-12-17 DIAGNOSIS — E119 Type 2 diabetes mellitus without complications: Secondary | ICD-10-CM

## 2015-12-17 LAB — MICROALBUMIN / CREATININE URINE RATIO
CREATININE, U: 53.7 mg/dL
MICROALB UR: 1.9 mg/dL (ref 0.0–1.9)
Microalb Creat Ratio: 3.5 mg/g (ref 0.0–30.0)

## 2015-12-17 MED ORDER — GABAPENTIN 100 MG PO CAPS: 100.0000 mg | ORAL_CAPSULE | Freq: Every day | ORAL | Status: DC

## 2015-12-17 NOTE — Patient Instructions (Signed)
Continue lifestyle intervention healthy eating and exercise . Labs are good .  Check urine for protein  Today .  Ok to use the gabapentin as needed .   Wellness physical  With labs in 6 months

## 2015-12-21 DIAGNOSIS — M9904 Segmental and somatic dysfunction of sacral region: Secondary | ICD-10-CM | POA: Diagnosis not present

## 2015-12-21 DIAGNOSIS — M5136 Other intervertebral disc degeneration, lumbar region: Secondary | ICD-10-CM | POA: Diagnosis not present

## 2015-12-21 DIAGNOSIS — M9905 Segmental and somatic dysfunction of pelvic region: Secondary | ICD-10-CM | POA: Diagnosis not present

## 2015-12-21 DIAGNOSIS — M9903 Segmental and somatic dysfunction of lumbar region: Secondary | ICD-10-CM | POA: Diagnosis not present

## 2016-01-06 DIAGNOSIS — M9905 Segmental and somatic dysfunction of pelvic region: Secondary | ICD-10-CM | POA: Diagnosis not present

## 2016-01-06 DIAGNOSIS — M5136 Other intervertebral disc degeneration, lumbar region: Secondary | ICD-10-CM | POA: Diagnosis not present

## 2016-01-06 DIAGNOSIS — M9904 Segmental and somatic dysfunction of sacral region: Secondary | ICD-10-CM | POA: Diagnosis not present

## 2016-01-06 DIAGNOSIS — M9903 Segmental and somatic dysfunction of lumbar region: Secondary | ICD-10-CM | POA: Diagnosis not present

## 2016-01-20 ENCOUNTER — Encounter: Payer: Self-pay | Admitting: Internal Medicine

## 2016-01-20 ENCOUNTER — Ambulatory Visit (INDEPENDENT_AMBULATORY_CARE_PROVIDER_SITE_OTHER): Payer: Medicare Other | Admitting: Internal Medicine

## 2016-01-20 VITALS — BP 154/78 | HR 85 | Temp 98.3°F | Ht 62.75 in | Wt 176.0 lb

## 2016-01-20 DIAGNOSIS — R21 Rash and other nonspecific skin eruption: Secondary | ICD-10-CM | POA: Diagnosis not present

## 2016-01-20 DIAGNOSIS — J988 Other specified respiratory disorders: Secondary | ICD-10-CM | POA: Diagnosis not present

## 2016-01-20 MED ORDER — FLUOCINONIDE-E 0.05 % EX CREA: 1.0000 "application " | TOPICAL_CREAM | Freq: Two times a day (BID) | CUTANEOUS | Status: DC

## 2016-01-20 NOTE — Progress Notes (Signed)
Pre visit review using our clinic review tool, if applicable. No additional management support is needed unless otherwise documented below in the visit note. 

## 2016-01-20 NOTE — Patient Instructions (Addendum)
Acts like an allergic response   Skin    Uncertain cause   take antihistamine  Daily whil having  allergy itchy  rash sx

## 2016-01-20 NOTE — Progress Notes (Signed)
Chief Complaint  Kerry presents with  . Rash    rash on both arms, noticed on sunday, has taken benadryl and cortisone cream     HPI: Kerry Walters 70 y.o.  Kerry Walters  comes in today for SDA for  new problem evaluation. Sun cough runny nose for 1 day  Sneezing coughing and scratcy thraot. then began a rash left forearm m and now on both arsm comup as bumps that itch  And then look blistery  Feels different that Pi was in garden and spraying on the weekend no fever no pain used otc hcs Taking  otc benadryl q 4 hours  ? No help  Some sinus pressure ROS: See pertinent positives and negatives per HPI. No fever sob other illness  New meds   Past Medical History  Diagnosis Date  . Hyperlipidemia   . Hypertension   . Nephrolithiasis     RIGHT   . GERD (gastroesophageal reflux disease)   . History of esophageal dilatation     FOR STRICTURE IN 2005  . History of kidney stones   . History of recurrent UTIs   . Pre-diabetes   . History of hyperparathyroidism     PRIMARY--  S/P RIGHT PARATHYROIDECTOMY  . History of hypercalcemia     SECONDARY TO HYPERPARATHYROID  . Hypothyroidism   . Diverticulosis of colon   . Incomplete right bundle branch block   . PONV (postoperative nausea and vomiting)   . Asthma due to environmental allergies     no inhaler  . History of hiatal hernia   . Sciatica of right side   . Nocturia     Family History  Problem Relation Age of Onset  . Cancer Son     liver  . Cancer Father     brain  . Diabetes Sister   . Schizophrenia Son     Social History   Social History  . Marital Status: Married    Spouse Name: N/A  . Number of Children: N/A  . Years of Education: N/A   Social History Main Topics  . Smoking status: Never Smoker   . Smokeless tobacco: Never Used  . Alcohol Use: No  . Drug Use: No  . Sexual Activity: Not Asked   Other Topics Concern  . None   Social History Narrative   Married   hh of 2   4 dogs   G2 P2     Water exercise.   Retired    [employer]    great grandchild              Outpatient Prescriptions Prior to Visit  Medication Sig Dispense Refill  . atorvastatin (LIPITOR) 20 MG tablet Take 1 tablet (20 mg total) by mouth every evening. 90 tablet 3  . gabapentin (NEURONTIN) 100 MG capsule Take 1-3 capsules (100-300 mg total) by mouth at bedtime. asdirected 90 capsule 1  . levothyroxine (SYNTHROID, LEVOTHROID) 75 MCG tablet TAKE 1 TABLET DAILY 90 tablet 3  . metFORMIN (GLUCOPHAGE-XR) 500 MG 24 hr tablet Take 3 tablets a day. 270 tablet 3  . pantoprazole (PROTONIX) 40 MG tablet Take 1 tablet (40 mg total) by mouth daily. 90 tablet 3  . PREMARIN vaginal cream Place 1 Applicatorful vaginally as directed. Use 2-3 times weekly  prn    . triamterene-hydrochlorothiazide (MAXZIDE-25) 37.5-25 MG tablet Take 1 tablet by mouth daily. 90 tablet 3   No facility-administered medications prior to visit.  EXAM:  BP 154/78 mmHg  Pulse 85  Temp(Src) 98.3 F (36.8 C) (Oral)  Ht 5' 2.75" (1.594 m)  Wt 176 lb (79.833 kg)  BMI 31.42 kg/m2  SpO2 97%  Body mass index is 31.42 kg/(m^2).  GENERAL: vitals reviewed and listed above, alert, oriented, appears well hydrated and in no acute distress mild nasal clear congestion HEENT: atraumatic, conjunctiva  clear, no obvious abnormalities on inspection of external nose and ears OP : no lesion edema or exudate  NECK: no obvious masses on inspection palpation  LUNGS: clear to auscultation bilaterally, no wheezes, rales or rhonchi, good air movement CV: HRRR,  Skin scattered pink red palpable dots with som blistering no vesicles  Umbilicated no pustules   On both forearms  Left more than right  No other rash no edema or hives  MS: moves all extremities without noticeable focal  abnormality PSYCH: pleasant and cooperative, no obvious depression or anxiety  ASSESSMENT AND PLAN:  Discussed the following assessment and plan:  Rash and nonspecific skin  eruption - ? allergic ? contact  no obv nifection rx with high potency steroid topical and follwo  Congestion of respiratory tract - prob allertgicbased oj context     Expectant management. No compromise  -Kerry advised to return or notify health care team  if symptoms worsen ,persist or new concerns arise.  Kerry Instructions  Acts like an allergic response   Skin    Uncertain cause   take antihistamine  Daily whil having  allergy itchy  rash sx      Burna MortimerWanda K. Panosh M.D.   ----------------------------------------------------------------------------------------

## 2016-02-08 DIAGNOSIS — M9905 Segmental and somatic dysfunction of pelvic region: Secondary | ICD-10-CM | POA: Diagnosis not present

## 2016-02-08 DIAGNOSIS — M5136 Other intervertebral disc degeneration, lumbar region: Secondary | ICD-10-CM | POA: Diagnosis not present

## 2016-02-08 DIAGNOSIS — M9904 Segmental and somatic dysfunction of sacral region: Secondary | ICD-10-CM | POA: Diagnosis not present

## 2016-02-08 DIAGNOSIS — M9903 Segmental and somatic dysfunction of lumbar region: Secondary | ICD-10-CM | POA: Diagnosis not present

## 2016-02-17 DIAGNOSIS — M9904 Segmental and somatic dysfunction of sacral region: Secondary | ICD-10-CM | POA: Diagnosis not present

## 2016-02-17 DIAGNOSIS — M5136 Other intervertebral disc degeneration, lumbar region: Secondary | ICD-10-CM | POA: Diagnosis not present

## 2016-02-17 DIAGNOSIS — M9905 Segmental and somatic dysfunction of pelvic region: Secondary | ICD-10-CM | POA: Diagnosis not present

## 2016-02-17 DIAGNOSIS — M9903 Segmental and somatic dysfunction of lumbar region: Secondary | ICD-10-CM | POA: Diagnosis not present

## 2016-02-25 DIAGNOSIS — M5136 Other intervertebral disc degeneration, lumbar region: Secondary | ICD-10-CM | POA: Diagnosis not present

## 2016-02-25 DIAGNOSIS — M9903 Segmental and somatic dysfunction of lumbar region: Secondary | ICD-10-CM | POA: Diagnosis not present

## 2016-02-25 DIAGNOSIS — M9904 Segmental and somatic dysfunction of sacral region: Secondary | ICD-10-CM | POA: Diagnosis not present

## 2016-02-25 DIAGNOSIS — M9905 Segmental and somatic dysfunction of pelvic region: Secondary | ICD-10-CM | POA: Diagnosis not present

## 2016-03-07 DIAGNOSIS — M9905 Segmental and somatic dysfunction of pelvic region: Secondary | ICD-10-CM | POA: Diagnosis not present

## 2016-03-07 DIAGNOSIS — M9904 Segmental and somatic dysfunction of sacral region: Secondary | ICD-10-CM | POA: Diagnosis not present

## 2016-03-07 DIAGNOSIS — M5136 Other intervertebral disc degeneration, lumbar region: Secondary | ICD-10-CM | POA: Diagnosis not present

## 2016-03-07 DIAGNOSIS — M9903 Segmental and somatic dysfunction of lumbar region: Secondary | ICD-10-CM | POA: Diagnosis not present

## 2016-03-15 DIAGNOSIS — H40013 Open angle with borderline findings, low risk, bilateral: Secondary | ICD-10-CM | POA: Diagnosis not present

## 2016-03-15 DIAGNOSIS — H2513 Age-related nuclear cataract, bilateral: Secondary | ICD-10-CM | POA: Diagnosis not present

## 2016-03-15 DIAGNOSIS — H43813 Vitreous degeneration, bilateral: Secondary | ICD-10-CM | POA: Diagnosis not present

## 2016-03-15 DIAGNOSIS — E119 Type 2 diabetes mellitus without complications: Secondary | ICD-10-CM | POA: Diagnosis not present

## 2016-03-15 LAB — HM DIABETES EYE EXAM

## 2016-03-28 ENCOUNTER — Encounter: Payer: Self-pay | Admitting: Family Medicine

## 2016-03-29 DIAGNOSIS — M50322 Other cervical disc degeneration at C5-C6 level: Secondary | ICD-10-CM | POA: Diagnosis not present

## 2016-03-29 DIAGNOSIS — M531 Cervicobrachial syndrome: Secondary | ICD-10-CM | POA: Diagnosis not present

## 2016-03-29 DIAGNOSIS — M9902 Segmental and somatic dysfunction of thoracic region: Secondary | ICD-10-CM | POA: Diagnosis not present

## 2016-03-29 DIAGNOSIS — M9901 Segmental and somatic dysfunction of cervical region: Secondary | ICD-10-CM | POA: Diagnosis not present

## 2016-04-01 DIAGNOSIS — M9901 Segmental and somatic dysfunction of cervical region: Secondary | ICD-10-CM | POA: Diagnosis not present

## 2016-04-01 DIAGNOSIS — M531 Cervicobrachial syndrome: Secondary | ICD-10-CM | POA: Diagnosis not present

## 2016-04-01 DIAGNOSIS — M9902 Segmental and somatic dysfunction of thoracic region: Secondary | ICD-10-CM | POA: Diagnosis not present

## 2016-04-01 DIAGNOSIS — M50322 Other cervical disc degeneration at C5-C6 level: Secondary | ICD-10-CM | POA: Diagnosis not present

## 2016-04-07 DIAGNOSIS — M9902 Segmental and somatic dysfunction of thoracic region: Secondary | ICD-10-CM | POA: Diagnosis not present

## 2016-04-07 DIAGNOSIS — H2513 Age-related nuclear cataract, bilateral: Secondary | ICD-10-CM | POA: Diagnosis not present

## 2016-04-07 DIAGNOSIS — M50322 Other cervical disc degeneration at C5-C6 level: Secondary | ICD-10-CM | POA: Diagnosis not present

## 2016-04-07 DIAGNOSIS — M9901 Segmental and somatic dysfunction of cervical region: Secondary | ICD-10-CM | POA: Diagnosis not present

## 2016-04-07 DIAGNOSIS — M531 Cervicobrachial syndrome: Secondary | ICD-10-CM | POA: Diagnosis not present

## 2016-04-07 DIAGNOSIS — H40013 Open angle with borderline findings, low risk, bilateral: Secondary | ICD-10-CM | POA: Diagnosis not present

## 2016-04-11 DIAGNOSIS — Z1231 Encounter for screening mammogram for malignant neoplasm of breast: Secondary | ICD-10-CM | POA: Diagnosis not present

## 2016-04-11 LAB — HM MAMMOGRAPHY

## 2016-04-13 ENCOUNTER — Encounter: Payer: Self-pay | Admitting: Family Medicine

## 2016-04-18 DIAGNOSIS — M50322 Other cervical disc degeneration at C5-C6 level: Secondary | ICD-10-CM | POA: Diagnosis not present

## 2016-04-18 DIAGNOSIS — M9903 Segmental and somatic dysfunction of lumbar region: Secondary | ICD-10-CM | POA: Diagnosis not present

## 2016-04-18 DIAGNOSIS — M9901 Segmental and somatic dysfunction of cervical region: Secondary | ICD-10-CM | POA: Diagnosis not present

## 2016-04-18 DIAGNOSIS — M5136 Other intervertebral disc degeneration, lumbar region: Secondary | ICD-10-CM | POA: Diagnosis not present

## 2016-04-20 DIAGNOSIS — M9901 Segmental and somatic dysfunction of cervical region: Secondary | ICD-10-CM | POA: Diagnosis not present

## 2016-04-20 DIAGNOSIS — M50322 Other cervical disc degeneration at C5-C6 level: Secondary | ICD-10-CM | POA: Diagnosis not present

## 2016-04-20 DIAGNOSIS — M9903 Segmental and somatic dysfunction of lumbar region: Secondary | ICD-10-CM | POA: Diagnosis not present

## 2016-04-20 DIAGNOSIS — M5136 Other intervertebral disc degeneration, lumbar region: Secondary | ICD-10-CM | POA: Diagnosis not present

## 2016-04-28 DIAGNOSIS — M5136 Other intervertebral disc degeneration, lumbar region: Secondary | ICD-10-CM | POA: Diagnosis not present

## 2016-04-28 DIAGNOSIS — M9903 Segmental and somatic dysfunction of lumbar region: Secondary | ICD-10-CM | POA: Diagnosis not present

## 2016-04-28 DIAGNOSIS — M9901 Segmental and somatic dysfunction of cervical region: Secondary | ICD-10-CM | POA: Diagnosis not present

## 2016-04-28 DIAGNOSIS — M50322 Other cervical disc degeneration at C5-C6 level: Secondary | ICD-10-CM | POA: Diagnosis not present

## 2016-05-10 DIAGNOSIS — M5136 Other intervertebral disc degeneration, lumbar region: Secondary | ICD-10-CM | POA: Diagnosis not present

## 2016-05-10 DIAGNOSIS — M9901 Segmental and somatic dysfunction of cervical region: Secondary | ICD-10-CM | POA: Diagnosis not present

## 2016-05-10 DIAGNOSIS — M9903 Segmental and somatic dysfunction of lumbar region: Secondary | ICD-10-CM | POA: Diagnosis not present

## 2016-05-10 DIAGNOSIS — M50322 Other cervical disc degeneration at C5-C6 level: Secondary | ICD-10-CM | POA: Diagnosis not present

## 2016-05-16 DIAGNOSIS — M5136 Other intervertebral disc degeneration, lumbar region: Secondary | ICD-10-CM | POA: Diagnosis not present

## 2016-05-16 DIAGNOSIS — M50322 Other cervical disc degeneration at C5-C6 level: Secondary | ICD-10-CM | POA: Diagnosis not present

## 2016-05-16 DIAGNOSIS — M9903 Segmental and somatic dysfunction of lumbar region: Secondary | ICD-10-CM | POA: Diagnosis not present

## 2016-05-16 DIAGNOSIS — M9901 Segmental and somatic dysfunction of cervical region: Secondary | ICD-10-CM | POA: Diagnosis not present

## 2016-05-24 DIAGNOSIS — M9903 Segmental and somatic dysfunction of lumbar region: Secondary | ICD-10-CM | POA: Diagnosis not present

## 2016-05-24 DIAGNOSIS — M5136 Other intervertebral disc degeneration, lumbar region: Secondary | ICD-10-CM | POA: Diagnosis not present

## 2016-05-24 DIAGNOSIS — M50322 Other cervical disc degeneration at C5-C6 level: Secondary | ICD-10-CM | POA: Diagnosis not present

## 2016-05-24 DIAGNOSIS — M9901 Segmental and somatic dysfunction of cervical region: Secondary | ICD-10-CM | POA: Diagnosis not present

## 2016-06-01 DIAGNOSIS — M9903 Segmental and somatic dysfunction of lumbar region: Secondary | ICD-10-CM | POA: Diagnosis not present

## 2016-06-01 DIAGNOSIS — M50322 Other cervical disc degeneration at C5-C6 level: Secondary | ICD-10-CM | POA: Diagnosis not present

## 2016-06-01 DIAGNOSIS — M5136 Other intervertebral disc degeneration, lumbar region: Secondary | ICD-10-CM | POA: Diagnosis not present

## 2016-06-01 DIAGNOSIS — M9901 Segmental and somatic dysfunction of cervical region: Secondary | ICD-10-CM | POA: Diagnosis not present

## 2016-06-13 ENCOUNTER — Other Ambulatory Visit: Payer: Medicare Other

## 2016-06-14 DIAGNOSIS — M5136 Other intervertebral disc degeneration, lumbar region: Secondary | ICD-10-CM | POA: Diagnosis not present

## 2016-06-14 DIAGNOSIS — M50322 Other cervical disc degeneration at C5-C6 level: Secondary | ICD-10-CM | POA: Diagnosis not present

## 2016-06-14 DIAGNOSIS — M9901 Segmental and somatic dysfunction of cervical region: Secondary | ICD-10-CM | POA: Diagnosis not present

## 2016-06-14 DIAGNOSIS — M9903 Segmental and somatic dysfunction of lumbar region: Secondary | ICD-10-CM | POA: Diagnosis not present

## 2016-06-17 ENCOUNTER — Ambulatory Visit: Payer: Medicare Other | Admitting: Internal Medicine

## 2016-06-21 DIAGNOSIS — M50322 Other cervical disc degeneration at C5-C6 level: Secondary | ICD-10-CM | POA: Diagnosis not present

## 2016-06-21 DIAGNOSIS — M9903 Segmental and somatic dysfunction of lumbar region: Secondary | ICD-10-CM | POA: Diagnosis not present

## 2016-06-21 DIAGNOSIS — M9901 Segmental and somatic dysfunction of cervical region: Secondary | ICD-10-CM | POA: Diagnosis not present

## 2016-06-21 DIAGNOSIS — M5136 Other intervertebral disc degeneration, lumbar region: Secondary | ICD-10-CM | POA: Diagnosis not present

## 2016-06-23 DIAGNOSIS — M50322 Other cervical disc degeneration at C5-C6 level: Secondary | ICD-10-CM | POA: Diagnosis not present

## 2016-06-23 DIAGNOSIS — M9901 Segmental and somatic dysfunction of cervical region: Secondary | ICD-10-CM | POA: Diagnosis not present

## 2016-06-23 DIAGNOSIS — M9903 Segmental and somatic dysfunction of lumbar region: Secondary | ICD-10-CM | POA: Diagnosis not present

## 2016-06-23 DIAGNOSIS — M5136 Other intervertebral disc degeneration, lumbar region: Secondary | ICD-10-CM | POA: Diagnosis not present

## 2016-06-27 DIAGNOSIS — M5136 Other intervertebral disc degeneration, lumbar region: Secondary | ICD-10-CM | POA: Diagnosis not present

## 2016-06-27 DIAGNOSIS — M50322 Other cervical disc degeneration at C5-C6 level: Secondary | ICD-10-CM | POA: Diagnosis not present

## 2016-06-27 DIAGNOSIS — M9901 Segmental and somatic dysfunction of cervical region: Secondary | ICD-10-CM | POA: Diagnosis not present

## 2016-06-27 DIAGNOSIS — M9903 Segmental and somatic dysfunction of lumbar region: Secondary | ICD-10-CM | POA: Diagnosis not present

## 2016-07-01 DIAGNOSIS — M9903 Segmental and somatic dysfunction of lumbar region: Secondary | ICD-10-CM | POA: Diagnosis not present

## 2016-07-01 DIAGNOSIS — M9901 Segmental and somatic dysfunction of cervical region: Secondary | ICD-10-CM | POA: Diagnosis not present

## 2016-07-01 DIAGNOSIS — M5136 Other intervertebral disc degeneration, lumbar region: Secondary | ICD-10-CM | POA: Diagnosis not present

## 2016-07-01 DIAGNOSIS — M50322 Other cervical disc degeneration at C5-C6 level: Secondary | ICD-10-CM | POA: Diagnosis not present

## 2016-07-06 DIAGNOSIS — M5136 Other intervertebral disc degeneration, lumbar region: Secondary | ICD-10-CM | POA: Diagnosis not present

## 2016-07-06 DIAGNOSIS — M9903 Segmental and somatic dysfunction of lumbar region: Secondary | ICD-10-CM | POA: Diagnosis not present

## 2016-07-06 DIAGNOSIS — M50322 Other cervical disc degeneration at C5-C6 level: Secondary | ICD-10-CM | POA: Diagnosis not present

## 2016-07-06 DIAGNOSIS — M9901 Segmental and somatic dysfunction of cervical region: Secondary | ICD-10-CM | POA: Diagnosis not present

## 2016-07-11 DIAGNOSIS — M9901 Segmental and somatic dysfunction of cervical region: Secondary | ICD-10-CM | POA: Diagnosis not present

## 2016-07-11 DIAGNOSIS — M50322 Other cervical disc degeneration at C5-C6 level: Secondary | ICD-10-CM | POA: Diagnosis not present

## 2016-07-11 DIAGNOSIS — M5136 Other intervertebral disc degeneration, lumbar region: Secondary | ICD-10-CM | POA: Diagnosis not present

## 2016-07-11 DIAGNOSIS — M9903 Segmental and somatic dysfunction of lumbar region: Secondary | ICD-10-CM | POA: Diagnosis not present

## 2016-07-14 DIAGNOSIS — M5136 Other intervertebral disc degeneration, lumbar region: Secondary | ICD-10-CM | POA: Diagnosis not present

## 2016-07-14 DIAGNOSIS — M9903 Segmental and somatic dysfunction of lumbar region: Secondary | ICD-10-CM | POA: Diagnosis not present

## 2016-07-14 DIAGNOSIS — M9901 Segmental and somatic dysfunction of cervical region: Secondary | ICD-10-CM | POA: Diagnosis not present

## 2016-07-14 DIAGNOSIS — M50322 Other cervical disc degeneration at C5-C6 level: Secondary | ICD-10-CM | POA: Diagnosis not present

## 2016-07-19 DIAGNOSIS — M9903 Segmental and somatic dysfunction of lumbar region: Secondary | ICD-10-CM | POA: Diagnosis not present

## 2016-07-19 DIAGNOSIS — M5136 Other intervertebral disc degeneration, lumbar region: Secondary | ICD-10-CM | POA: Diagnosis not present

## 2016-07-19 DIAGNOSIS — M50322 Other cervical disc degeneration at C5-C6 level: Secondary | ICD-10-CM | POA: Diagnosis not present

## 2016-07-19 DIAGNOSIS — M9901 Segmental and somatic dysfunction of cervical region: Secondary | ICD-10-CM | POA: Diagnosis not present

## 2016-07-26 DIAGNOSIS — M9901 Segmental and somatic dysfunction of cervical region: Secondary | ICD-10-CM | POA: Diagnosis not present

## 2016-07-26 DIAGNOSIS — M50322 Other cervical disc degeneration at C5-C6 level: Secondary | ICD-10-CM | POA: Diagnosis not present

## 2016-07-26 DIAGNOSIS — M9903 Segmental and somatic dysfunction of lumbar region: Secondary | ICD-10-CM | POA: Diagnosis not present

## 2016-07-26 DIAGNOSIS — M5136 Other intervertebral disc degeneration, lumbar region: Secondary | ICD-10-CM | POA: Diagnosis not present

## 2016-07-28 DIAGNOSIS — M5136 Other intervertebral disc degeneration, lumbar region: Secondary | ICD-10-CM | POA: Diagnosis not present

## 2016-07-28 DIAGNOSIS — M9901 Segmental and somatic dysfunction of cervical region: Secondary | ICD-10-CM | POA: Diagnosis not present

## 2016-07-28 DIAGNOSIS — M50322 Other cervical disc degeneration at C5-C6 level: Secondary | ICD-10-CM | POA: Diagnosis not present

## 2016-07-28 DIAGNOSIS — M9903 Segmental and somatic dysfunction of lumbar region: Secondary | ICD-10-CM | POA: Diagnosis not present

## 2016-08-02 DIAGNOSIS — M50322 Other cervical disc degeneration at C5-C6 level: Secondary | ICD-10-CM | POA: Diagnosis not present

## 2016-08-02 DIAGNOSIS — M9903 Segmental and somatic dysfunction of lumbar region: Secondary | ICD-10-CM | POA: Diagnosis not present

## 2016-08-02 DIAGNOSIS — M5136 Other intervertebral disc degeneration, lumbar region: Secondary | ICD-10-CM | POA: Diagnosis not present

## 2016-08-02 DIAGNOSIS — M9901 Segmental and somatic dysfunction of cervical region: Secondary | ICD-10-CM | POA: Diagnosis not present

## 2016-08-09 DIAGNOSIS — M9903 Segmental and somatic dysfunction of lumbar region: Secondary | ICD-10-CM | POA: Diagnosis not present

## 2016-08-09 DIAGNOSIS — M5136 Other intervertebral disc degeneration, lumbar region: Secondary | ICD-10-CM | POA: Diagnosis not present

## 2016-08-09 DIAGNOSIS — M50322 Other cervical disc degeneration at C5-C6 level: Secondary | ICD-10-CM | POA: Diagnosis not present

## 2016-08-09 DIAGNOSIS — M9901 Segmental and somatic dysfunction of cervical region: Secondary | ICD-10-CM | POA: Diagnosis not present

## 2016-08-10 ENCOUNTER — Other Ambulatory Visit: Payer: Medicare Other

## 2016-08-12 DIAGNOSIS — M9903 Segmental and somatic dysfunction of lumbar region: Secondary | ICD-10-CM | POA: Diagnosis not present

## 2016-08-12 DIAGNOSIS — M9901 Segmental and somatic dysfunction of cervical region: Secondary | ICD-10-CM | POA: Diagnosis not present

## 2016-08-12 DIAGNOSIS — M5136 Other intervertebral disc degeneration, lumbar region: Secondary | ICD-10-CM | POA: Diagnosis not present

## 2016-08-12 DIAGNOSIS — M50322 Other cervical disc degeneration at C5-C6 level: Secondary | ICD-10-CM | POA: Diagnosis not present

## 2016-08-15 ENCOUNTER — Encounter: Payer: Medicare Other | Admitting: Internal Medicine

## 2016-08-16 DIAGNOSIS — M9903 Segmental and somatic dysfunction of lumbar region: Secondary | ICD-10-CM | POA: Diagnosis not present

## 2016-08-16 DIAGNOSIS — M50322 Other cervical disc degeneration at C5-C6 level: Secondary | ICD-10-CM | POA: Diagnosis not present

## 2016-08-16 DIAGNOSIS — M9901 Segmental and somatic dysfunction of cervical region: Secondary | ICD-10-CM | POA: Diagnosis not present

## 2016-08-16 DIAGNOSIS — M5136 Other intervertebral disc degeneration, lumbar region: Secondary | ICD-10-CM | POA: Diagnosis not present

## 2016-08-18 ENCOUNTER — Other Ambulatory Visit (INDEPENDENT_AMBULATORY_CARE_PROVIDER_SITE_OTHER): Payer: Medicare Other

## 2016-08-18 DIAGNOSIS — Z0000 Encounter for general adult medical examination without abnormal findings: Secondary | ICD-10-CM

## 2016-08-18 LAB — CBC WITH DIFFERENTIAL/PLATELET
BASOS PCT: 0.3 % (ref 0.0–3.0)
Basophils Absolute: 0 10*3/uL (ref 0.0–0.1)
EOS ABS: 0.2 10*3/uL (ref 0.0–0.7)
EOS PCT: 2.4 % (ref 0.0–5.0)
HCT: 38 % (ref 36.0–46.0)
HEMOGLOBIN: 12.9 g/dL (ref 12.0–15.0)
LYMPHS ABS: 2.3 10*3/uL (ref 0.7–4.0)
Lymphocytes Relative: 28.8 % (ref 12.0–46.0)
MCHC: 33.9 g/dL (ref 30.0–36.0)
MCV: 85.4 fl (ref 78.0–100.0)
MONO ABS: 0.5 10*3/uL (ref 0.1–1.0)
Monocytes Relative: 6.5 % (ref 3.0–12.0)
NEUTROS ABS: 5 10*3/uL (ref 1.4–7.7)
Neutrophils Relative %: 62 % (ref 43.0–77.0)
PLATELETS: 286 10*3/uL (ref 150.0–400.0)
RBC: 4.45 Mil/uL (ref 3.87–5.11)
RDW: 14.1 % (ref 11.5–15.5)
WBC: 8.1 10*3/uL (ref 4.0–10.5)

## 2016-08-18 LAB — BASIC METABOLIC PANEL
BUN: 18 mg/dL (ref 6–23)
CHLORIDE: 102 meq/L (ref 96–112)
CO2: 34 meq/L — AB (ref 19–32)
CREATININE: 1.04 mg/dL (ref 0.40–1.20)
Calcium: 10.4 mg/dL (ref 8.4–10.5)
GFR: 55.28 mL/min — ABNORMAL LOW (ref >60.00)
GLUCOSE: 151 mg/dL — AB (ref 70–99)
POTASSIUM: 4.2 meq/L (ref 3.5–5.1)
Sodium: 142 mEq/L (ref 135–145)

## 2016-08-18 LAB — MICROALBUMIN / CREATININE URINE RATIO
Creatinine,U: 121 mg/dL
MICROALB UR: 6.8 mg/dL — AB (ref 0.0–1.9)
MICROALB/CREAT RATIO: 5.6 mg/g (ref 0.0–30.0)

## 2016-08-18 LAB — HEPATIC FUNCTION PANEL
ALT: 38 U/L — AB (ref 0–35)
AST: 26 U/L (ref 0–37)
Albumin: 4.2 g/dL (ref 3.5–5.2)
Alkaline Phosphatase: 68 U/L (ref 39–117)
BILIRUBIN DIRECT: 0.1 mg/dL (ref 0.0–0.3)
BILIRUBIN TOTAL: 0.5 mg/dL (ref 0.2–1.2)
Total Protein: 6.8 g/dL (ref 6.0–8.3)

## 2016-08-18 LAB — LIPID PANEL
CHOLESTEROL: 142 mg/dL (ref 0–200)
HDL: 38.2 mg/dL — AB (ref >39.00)
LDL CALC: 74 mg/dL (ref 0–99)
NonHDL: 103.46
TRIGLYCERIDES: 145 mg/dL (ref 0.0–149.0)
Total CHOL/HDL Ratio: 4
VLDL: 29 mg/dL (ref 0.0–40.0)

## 2016-08-18 LAB — HEMOGLOBIN A1C: HEMOGLOBIN A1C: 6.8 % — AB (ref 4.6–6.5)

## 2016-08-18 LAB — TSH: TSH: 2.85 u[IU]/mL (ref 0.35–4.50)

## 2016-08-23 ENCOUNTER — Ambulatory Visit (INDEPENDENT_AMBULATORY_CARE_PROVIDER_SITE_OTHER): Payer: Medicare Other | Admitting: Internal Medicine

## 2016-08-23 ENCOUNTER — Encounter: Payer: Self-pay | Admitting: Internal Medicine

## 2016-08-23 VITALS — BP 152/70 | Temp 97.6°F | Ht 62.0 in | Wt 175.4 lb

## 2016-08-23 DIAGNOSIS — I10 Essential (primary) hypertension: Secondary | ICD-10-CM

## 2016-08-23 DIAGNOSIS — Z0000 Encounter for general adult medical examination without abnormal findings: Secondary | ICD-10-CM | POA: Diagnosis not present

## 2016-08-23 DIAGNOSIS — E785 Hyperlipidemia, unspecified: Secondary | ICD-10-CM | POA: Diagnosis not present

## 2016-08-23 DIAGNOSIS — F439 Reaction to severe stress, unspecified: Secondary | ICD-10-CM

## 2016-08-23 DIAGNOSIS — Z79899 Other long term (current) drug therapy: Secondary | ICD-10-CM | POA: Diagnosis not present

## 2016-08-23 DIAGNOSIS — E119 Type 2 diabetes mellitus without complications: Secondary | ICD-10-CM | POA: Diagnosis not present

## 2016-08-23 DIAGNOSIS — Z23 Encounter for immunization: Secondary | ICD-10-CM | POA: Diagnosis not present

## 2016-08-23 MED ORDER — PANTOPRAZOLE SODIUM 40 MG PO TBEC: 40.0000 mg | DELAYED_RELEASE_TABLET | Freq: Every day | ORAL | 3 refills | Status: DC

## 2016-08-23 MED ORDER — METFORMIN HCL ER 500 MG PO TB24: ORAL_TABLET | ORAL | 3 refills | Status: DC

## 2016-08-23 MED ORDER — ATORVASTATIN CALCIUM 20 MG PO TABS: 20.0000 mg | ORAL_TABLET | Freq: Every evening | ORAL | 3 refills | Status: DC

## 2016-08-23 MED ORDER — LOSARTAN POTASSIUM-HCTZ 50-12.5 MG PO TABS: 1.0000 | ORAL_TABLET | Freq: Every day | ORAL | 3 refills | Status: DC

## 2016-08-23 MED ORDER — GABAPENTIN 100 MG PO CAPS: 100.0000 mg | ORAL_CAPSULE | Freq: Every day | ORAL | 1 refills | Status: DC

## 2016-08-23 MED ORDER — LEVOTHYROXINE SODIUM 75 MCG PO TABS: ORAL_TABLET | ORAL | 3 refills | Status: DC

## 2016-08-23 NOTE — Patient Instructions (Addendum)
Continue avoiding sugars and simple carbohydrates. Your diabetes is stable. We should still consider adding ace receptor blocker ace inhibitor to your blood pressure regimen to protect your kidneys and control blood pressure optimally. Newer  BP goal is  Now in the 130/80 range .   We can change t your bp med to diuretic and  acei  Try to maintain a healthy lifestyle despite the stress.  When ready  Stop the diuretic pill and begin new medication  .  ROV after a month   And we may adjsut medication  dosing if needed at that time   Cologuard for colon cancer screening     Kit to be sent to you . If negative  No screen needed for 3 years.

## 2016-08-23 NOTE — Progress Notes (Signed)
Pre visit review using our clinic review tool, if applicable. No additional management support is needed unless otherwise documented below in the visit note.  Chief Complaint  Patient presents with  . Medicare Wellness    HPI: Kerry Walters 70 y.o. comes in today for Preventive Medicare wellness visit  And Chronic disease management .Since last visit.  BP not checking taking diuretic  Med  No se of meds   Lots os stress's coping   Gabapentin as needed Helps  Back and  Leg pain at night  Wanting to move . Sleep   Takes mustard for leg cramps   Stressful.   gg daughter drug effects .   DM bg  No change in meds  Doesn't check  Sugars .  Lipids no se of meds noted ?  gerd  Needs refill has  Cough from reflux  Thyroid good commpliance with meds   Health Maintenance  Topic Date Due  . DEXA SCAN  02/14/2009  . FOOT EXAM  02/23/2016  . COLONOSCOPY  08/22/2017 (Originally 12/22/2014)  . Hepatitis C Screening  08/22/2017 (Originally 09/07/1945)  . HEMOGLOBIN A1C  02/16/2017  . OPHTHALMOLOGY EXAM  03/15/2017  . URINE MICROALBUMIN  08/18/2017  . MAMMOGRAM  04/11/2018  . TETANUS/TDAP  11/23/2024  . INFLUENZA VACCINE  Completed  . ZOSTAVAX  Completed  . PNA vac Low Risk Adult  Completed   Health Maintenance Review LIFESTYLE:  TAD n Sugar beverages: no Sleep: no sleeping well stress.  And age .    Off and on .  hh of 2   MEDICARE DOCUMENT QUESTIONS  TO SCAN  Hearing:  Ok   Vision:  No limitations at present . Last eye check UTD  Safety:  Has smoke detector and wears seat belts.  No firearms. No excess sun exposure. Sees dentist regularly.  Falls:  no  Advance directive :  Reviewed  Has one.  Memory: Felt to be good  , no concern from her or her family.  Depression: No anhedonia unusual crying or depressive symptoms  Nutrition: Eats well balanced diet; adequate calcium and vitamin D. No swallowing chewing problems.  Injury: no major injuries in the last six  months.  Other healthcare providers:  Reviewed today .  Social:  Lives with spouse married. hh of 2  No pets.   Preventive parameters: up-to-date  Reviewed   ADLS:   There are no problems or need for assistance  driving, feeding, obtaining food, dressing, toileting and bathing, managing money using phone. She is independent.    ROS: see abovea bout legs  No numbness or tingling or weakness GEN/ HEENT: No fever, significant weight changes sweats headaches vision problems hearing changes, CV/ PULM; No chest pain shortness of breath cough, syncope,edema  change in exercise tolerance. GI /GU: No adominal pain, vomiting, change in bowel habits. No blood in the stool. No significant GU symptoms. SKIN/HEME: ,no acute skin rashes suspicious lesions or bleeding. No lymphadenopathy, nodules, masses.  NEURO/ PSYCH:  No neurologic signs such as weakness numbness. No depression anxiety. IMM/ Allergy: No unusual infections.  Allergy .   REST of 12 system review negative except as per HPI   Past Medical History:  Diagnosis Date  . Asthma due to environmental allergies    no inhaler  . Diverticulosis of colon   . GERD (gastroesophageal reflux disease)   . History of esophageal dilatation    FOR STRICTURE IN 2005  . History of hiatal hernia   .  History of hypercalcemia    SECONDARY TO HYPERPARATHYROID  . History of hyperparathyroidism    PRIMARY--  S/P RIGHT PARATHYROIDECTOMY  . History of kidney stones   . History of recurrent UTIs   . Hyperlipidemia   . Hypertension   . Hypothyroidism   . Incomplete right bundle branch block   . Nephrolithiasis    RIGHT   . Nocturia   . PONV (postoperative nausea and vomiting)   . Pre-diabetes   . Sciatica of right side     Family History  Problem Relation Age of Onset  . Cancer Son     liver  . Cancer Father     brain  . Diabetes Sister   . Schizophrenia Son     Social History   Social History  . Marital status: Married    Spouse  name: N/A  . Number of children: N/A  . Years of education: N/A   Social History Main Topics  . Smoking status: Never Smoker  . Smokeless tobacco: Never Used  . Alcohol use No  . Drug use: No  . Sexual activity: Not Asked   Other Topics Concern  . None   Social History Narrative   Married   hh of 2   4 dogs   G2 P2   Water exercise.   Retired    [employer]    great grandchild              Outpatient Encounter Prescriptions as of 08/23/2016  Medication Sig  . gabapentin (NEURONTIN) 100 MG capsule Take 1-3 capsules (100-300 mg total) by mouth at bedtime. asdirected  . levothyroxine (SYNTHROID, LEVOTHROID) 75 MCG tablet TAKE 1 TABLET DAILY  . metFORMIN (GLUCOPHAGE-XR) 500 MG 24 hr tablet Take 3 tablets a day.  . pantoprazole (PROTONIX) 40 MG tablet Take 1 tablet (40 mg total) by mouth daily.  [name] PREMARIN vaginal cream Place 1 Applicatorful vaginally as directed. Use 2-3 times weekly  prn  . [DISCONTINUED] gabapentin (NEURONTIN) 100 MG capsule Take 1-3 capsules (100-300 mg total) by mouth at bedtime. asdirected  . [DISCONTINUED] levothyroxine (SYNTHROID, LEVOTHROID) 75 MCG tablet TAKE 1 TABLET DAILY  . [DISCONTINUED] metFORMIN (GLUCOPHAGE-XR) 500 MG 24 hr tablet Take 3 tablets a day.  . [DISCONTINUED] pantoprazole (PROTONIX) 40 MG tablet Take 1 tablet (40 mg total) by mouth daily.  . [DISCONTINUED] triamterene-hydrochlorothiazide (MAXZIDE-25) 37.5-25 MG tablet Take 1 tablet by mouth daily.  [name] atorvastatin (LIPITOR) 20 MG tablet Take 1 tablet (20 mg total) by mouth every evening.  [name] losartan-hydrochlorothiazide (HYZAAR) 50-12.5 MG tablet Take 1 tablet by mouth daily.  . [DISCONTINUED] atorvastatin (LIPITOR) 20 MG tablet Take 1 tablet (20 mg total) by mouth every evening.  . [DISCONTINUED] fluocinonide-emollient (LIDEX-E) 0.05 % cream Apply 1 application topically 2 (two) times daily.   No facility-administered encounter medications on file as of 08/23/2016.     EXAM:  BP (!)  152/70 (BP Location: Right Arm, Cuff Size: Large)   Temp 97.6 F (36.4 C) (Oral)   Ht '5\' 2"'  (1.575 m)   Wt 175 lb 6.4 oz (79.6 kg)   BMI 32.08 kg/m   Body mass index is 32.08 kg/m.  Physical Exam: Vital signs reviewed QQP:YPPJ is a well-developed well-nourished alert cooperative   who appears stated age in no acute distress.  HEENT: normocephalic atraumatic , Eyes: PERRL EOM's full, conjunctiva clear, Nares: paten,t no deformity discharge or tenderness., Ears: no deformity EAC's clear TMs with normal landmarks. Mouth: clear OP, no lesions, edema.  Moist mucous membranes. Dentition in adequate repair. NECK: supple without masses, thyromegaly or bruits. CHEST/PULM:  Clear to auscultation and percussion breath sounds equal no wheeze , rales or rhonchi. No chest wall deformities or tenderness.Breast: normal by inspection . No dimpling, discharge, masses, tenderness or discharge . CV: PMI is nondisplaced, S1 S2 no gallops, murmurs, rubs. Peripheral pulses are full without delay.No JVD .  ABDOMEN: Bowel sounds normal nontender  No guard or rebound, no hepato splenomegal no CVA tenderness.   Extremtities:  No clubbing cyanosis or edema, no acute joint swelling or redness no focal atrophy NEURO:  Oriented x3, cranial nerves 3-12 appear to be intact, no obvious focal weakness,gait within normal limits no abnormal reflexes or asymmetrical SKIN: No acute rashes normal turgor, color, no bruising or petechiae. PSYCH: Oriented, good eye contact, no obvious depression anxiety, cognition and judgment appear normal. LN: no cervical axillary inguinal adenopathy No noted deficits in memory, attention, and speech. Diabetic Foot Exam - Simple   Simple Foot Form Diabetic Foot exam was performed with the following findings:  Yes 08/23/2016  2:55 PM  Visual Inspection No deformities, no ulcerations, no other skin breakdown bilaterally:  Yes Sensation Testing Intact to touch and monofilament testing  bilaterally:  Yes Pulse Check Posterior Tibialis and Dorsalis pulse intact bilaterally:  Yes Comments      Lab Results  Component Value Date   WBC 8.1 08/18/2016   HGB 12.9 08/18/2016   HCT 38.0 08/18/2016   PLT 286.0 08/18/2016   GLUCOSE 151 (H) 08/18/2016   CHOL 142 08/18/2016   TRIG 145.0 08/18/2016   HDL 38.20 (L) 08/18/2016   LDLCALC 74 08/18/2016   ALT 38 (H) 08/18/2016   AST 26 08/18/2016   NA 142 08/18/2016   K 4.2 08/18/2016   CL 102 08/18/2016   CREATININE 1.04 08/18/2016   BUN 18 08/18/2016   CO2 34 (H) 08/18/2016   TSH 2.85 08/18/2016   INR 1.12 11/19/2009   HGBA1C 6.8 (H) 08/18/2016   MICROALBUR 6.8 (H) 08/18/2016   BP Readings from Last 3 Encounters:  08/23/16 (!) 152/70  01/20/16 (!) 154/78  12/17/15 130/70   Wt Readings from Last 3 Encounters:  08/23/16 175 lb 6.4 oz (79.6 kg)  01/20/16 176 lb (79.8 kg)  12/17/15 178 lb 8 oz (81 kg)   reviewed labs and repeat bp readings elevated   ASSESSMENT AND PLAN:  Discussed the following assessment and plan:  Visit for preventive health examination  Medicare annual wellness visit, subsequent  Essential hypertension - not at goal has cough gerd will change ot arb diuretic  pt skeptical but will try  Diabetes mellitus without complication ([place]) - controlled stay on med  Hyperlipidemia, unspecified hyperlipidemia type  Medication management  Need for prophylactic vaccination and inoculation against influenza - Plan: Flu vaccine HIGH DOSE PF (Fluzone High dose)  Stress  type 2 dm in control Minor liver abnormality probably from fatty liver. Will follow.  Colon cancer screen reviewed  Last colon and fam hx negative   We discussed her stressful situation and she is doing well expected. Also reviewed guidelines for blood pressure control optimal use of Ace and are been diabetes for renal protection. Because she has a cough and GERD convinced her to try an ARB plus diuretic instead of the diuretic and  follow-up in about a month. May need to adjsut dosing  Patient Care Team: Burnis Medin, MD as PCP - General Garald Balding, MD (Orthopedic Surgery) Druscilla Brownie,  MD (Dermatology) Clent Jacks, MD (Ophthalmology) Lowella Bandy, MD as Consulting Physician (Urology) Dr Dondra Prader chiropractor   Patient Instructions  Continue avoiding sugars and simple carbohydrates. Your diabetes is stable. We should still consider adding ace receptor blocker ace inhibitor to your blood pressure regimen to protect your kidneys and control blood pressure optimally. Newer  BP goal is  Now in the 130/80 range .   We can change t your bp med to diuretic and  acei  Try to maintain a healthy lifestyle despite the stress.  When ready  Stop the diuretic pill and begin new medication  .  ROV after a month   And we may adjsut medication  dosing if needed at that time   Cologuard for colon cancer screening     Kit to be sent to you . If negative  No screen needed for 3 years.         Standley Brooking.  M.D.

## 2016-08-26 ENCOUNTER — Other Ambulatory Visit: Payer: Self-pay | Admitting: Internal Medicine

## 2016-08-26 DIAGNOSIS — M9901 Segmental and somatic dysfunction of cervical region: Secondary | ICD-10-CM | POA: Diagnosis not present

## 2016-08-26 DIAGNOSIS — M9903 Segmental and somatic dysfunction of lumbar region: Secondary | ICD-10-CM | POA: Diagnosis not present

## 2016-08-26 DIAGNOSIS — M50322 Other cervical disc degeneration at C5-C6 level: Secondary | ICD-10-CM | POA: Diagnosis not present

## 2016-08-26 DIAGNOSIS — M5136 Other intervertebral disc degeneration, lumbar region: Secondary | ICD-10-CM | POA: Diagnosis not present

## 2016-08-26 MED ORDER — LOSARTAN POTASSIUM-HCTZ 50-12.5 MG PO TABS: 1.0000 | ORAL_TABLET | Freq: Every day | ORAL | 3 refills | Status: DC

## 2016-08-30 DIAGNOSIS — M9901 Segmental and somatic dysfunction of cervical region: Secondary | ICD-10-CM | POA: Diagnosis not present

## 2016-08-30 DIAGNOSIS — M9903 Segmental and somatic dysfunction of lumbar region: Secondary | ICD-10-CM | POA: Diagnosis not present

## 2016-08-30 DIAGNOSIS — M5136 Other intervertebral disc degeneration, lumbar region: Secondary | ICD-10-CM | POA: Diagnosis not present

## 2016-08-30 DIAGNOSIS — M50322 Other cervical disc degeneration at C5-C6 level: Secondary | ICD-10-CM | POA: Diagnosis not present

## 2016-08-31 ENCOUNTER — Encounter: Payer: Self-pay | Admitting: Internal Medicine

## 2016-08-31 DIAGNOSIS — Z1211 Encounter for screening for malignant neoplasm of colon: Secondary | ICD-10-CM | POA: Diagnosis not present

## 2016-08-31 DIAGNOSIS — Z1212 Encounter for screening for malignant neoplasm of rectum: Secondary | ICD-10-CM | POA: Diagnosis not present

## 2016-09-05 ENCOUNTER — Telehealth: Payer: Self-pay | Admitting: Internal Medicine

## 2016-09-05 MED ORDER — METFORMIN HCL ER 500 MG PO TB24: ORAL_TABLET | ORAL | 0 refills | Status: DC

## 2016-09-05 MED ORDER — LEVOTHYROXINE SODIUM 75 MCG PO TABS: ORAL_TABLET | ORAL | 0 refills | Status: DC

## 2016-09-05 NOTE — Telephone Encounter (Signed)
Sent to the pharmacy by e-scribe. 

## 2016-09-05 NOTE — Telephone Encounter (Signed)
Pt has not received her mail order Rx yet and is completely out of the metFORMIN (GLUCOPHAGE-XR) 500 MG 24 hr tablet  levothyroxine (SYNTHROID, LEVOTHROID) 75 MCG tablet  Pt would like 5 days worth Metformin 15 tabs levothyroxine  5 tabs  Sent to  [facility]

## 2016-09-06 ENCOUNTER — Telehealth: Payer: Self-pay | Admitting: Internal Medicine

## 2016-09-06 LAB — COLOGUARD: COLOGUARD: NEGATIVE

## 2016-09-06 NOTE — Telephone Encounter (Signed)
Asked LaWana to inform pharmacy to go ahead and change generic brands.  Pt will need to come in 3-4 months to have tsh level checked.  Tried to reach the pt.  Left a message for a return call.

## 2016-09-06 NOTE — Telephone Encounter (Signed)
The pharmacy is calling to see if it is okay for them to fill levothyroxine with a different manufacture that they have in stock?

## 2016-09-08 ENCOUNTER — Encounter: Payer: Self-pay | Admitting: Family Medicine

## 2016-09-08 DIAGNOSIS — M9903 Segmental and somatic dysfunction of lumbar region: Secondary | ICD-10-CM | POA: Diagnosis not present

## 2016-09-08 DIAGNOSIS — M50322 Other cervical disc degeneration at C5-C6 level: Secondary | ICD-10-CM | POA: Diagnosis not present

## 2016-09-08 DIAGNOSIS — M67441 Ganglion, right hand: Secondary | ICD-10-CM | POA: Diagnosis not present

## 2016-09-08 DIAGNOSIS — L570 Actinic keratosis: Secondary | ICD-10-CM | POA: Diagnosis not present

## 2016-09-08 DIAGNOSIS — M9901 Segmental and somatic dysfunction of cervical region: Secondary | ICD-10-CM | POA: Diagnosis not present

## 2016-09-08 DIAGNOSIS — M5136 Other intervertebral disc degeneration, lumbar region: Secondary | ICD-10-CM | POA: Diagnosis not present

## 2016-09-08 DIAGNOSIS — L820 Inflamed seborrheic keratosis: Secondary | ICD-10-CM | POA: Diagnosis not present

## 2016-09-08 NOTE — Telephone Encounter (Signed)
Pt picked up her rx. Pt aware need to recheck tsh and appointment for lab has been scheduled.  Can you put the order in?

## 2016-09-08 NOTE — Telephone Encounter (Signed)
Left a message for a return call on home phone. 

## 2016-09-09 ENCOUNTER — Other Ambulatory Visit: Payer: Self-pay | Admitting: Family Medicine

## 2016-09-09 DIAGNOSIS — E039 Hypothyroidism, unspecified: Secondary | ICD-10-CM

## 2016-09-09 NOTE — Telephone Encounter (Signed)
I agree   Check  tsh after 3 months  After change

## 2016-09-09 NOTE — Telephone Encounter (Signed)
Order for TSH placed in Epic.  [facility]

## 2016-09-19 DIAGNOSIS — M9901 Segmental and somatic dysfunction of cervical region: Secondary | ICD-10-CM | POA: Diagnosis not present

## 2016-09-19 DIAGNOSIS — M9903 Segmental and somatic dysfunction of lumbar region: Secondary | ICD-10-CM | POA: Diagnosis not present

## 2016-09-19 DIAGNOSIS — M5136 Other intervertebral disc degeneration, lumbar region: Secondary | ICD-10-CM | POA: Diagnosis not present

## 2016-09-19 DIAGNOSIS — M50322 Other cervical disc degeneration at C5-C6 level: Secondary | ICD-10-CM | POA: Diagnosis not present

## 2016-09-27 NOTE — Progress Notes (Signed)
Pre visit review using our clinic review tool, if applicable. No additional management support is needed unless otherwise documented below in the visit note.  Chief Complaint  Patient presents with  . Follow-up    HPI: Kerry GrovesMary Sue Walters 71 y.o.  Change  bp med ti diur acei  Since her last visit no side effects of medicine except she has noticed when she lays on her left side in bed she hears her heartbeat with occasional skip. Doesn't hearing any other time. She does have a lot of anxiety and sleep issues but is decide what she wants to do about her family member that's causing stress. Otherwise no cough chest pain shortness of breath. She doesn't take her blood pressure readings at home. ROS: See pertinent positives and negatives per HPI.  Past Medical History:  Diagnosis Date  . Asthma due to environmental allergies    no inhaler  . Diverticulosis of colon   . GERD (gastroesophageal reflux disease)   . History of esophageal dilatation    FOR STRICTURE IN 2005  . History of hiatal hernia   . History of hypercalcemia    SECONDARY TO HYPERPARATHYROID  . History of hyperparathyroidism    PRIMARY--  S/P RIGHT PARATHYROIDECTOMY  . History of kidney stones   . History of recurrent UTIs   . Hyperlipidemia   . Hypertension   . Hypothyroidism   . Incomplete right bundle branch block   . Nephrolithiasis    RIGHT   . Nocturia   . PONV (postoperative nausea and vomiting)   . Pre-diabetes   . Sciatica of right side     Family History  Problem Relation Age of Onset  . Cancer Son     liver  . Cancer Father     brain  . Diabetes Sister   . Schizophrenia Son     Social History   Social History  . Marital status: Married    Spouse name: N/A  . Number of children: N/A  . Years of education: N/A   Social History Main Topics  . Smoking status: Never Smoker  . Smokeless tobacco: Never Used  . Alcohol use No  . Drug use: No  . Sexual activity: Not Asked   Other Topics  Concern  . None   Social History Narrative   Married   hh of 2   4 dogs   G2 P2   Water exercise.   Retired    [employer]    great grandchild              Outpatient Medications Prior to Visit  Medication Sig Dispense Refill  . atorvastatin (LIPITOR) 20 MG tablet Take 1 tablet (20 mg total) by mouth every evening. 90 tablet 3  . gabapentin (NEURONTIN) 100 MG capsule Take 1-3 capsules (100-300 mg total) by mouth at bedtime. asdirected 90 capsule 1  . levothyroxine (SYNTHROID, LEVOTHROID) 75 MCG tablet TAKE 1 TABLET DAILY 5 tablet 0  . losartan-hydrochlorothiazide (HYZAAR) 50-12.5 MG tablet Take 1 tablet by mouth daily. 30 tablet 3  . metFORMIN (GLUCOPHAGE-XR) 500 MG 24 hr tablet Take 3 tablets a day. 15 tablet 0  . pantoprazole (PROTONIX) 40 MG tablet Take 1 tablet (40 mg total) by mouth daily. 90 tablet 3  . PREMARIN vaginal cream Place 1 Applicatorful vaginally as directed. Use 2-3 times weekly  prn     No facility-administered medications prior to visit.      EXAM:  BP 138/72   Temp 97.9 F (  36.6 C) (Oral)   Ht 5\' 2"  (1.575 m)   Wt 178 lb (80.7 kg)   BMI 32.56 kg/m   Body mass index is 32.56 kg/m.  GENERAL: vitals reviewed and listed above, alert, oriented, appears well hydrated and in no acute distress HEENT: atraumatic, conjunctiva  clear, no obvious abnormalities on inspection of external nose and ears NECK: no obvious masses on inspection palpation  LUNGS: clear to auscultation bilaterally, no wheezes, rales or rhonchi,  CV: HRRR, no clubbing cyanosis or  peripheral edema nl cap refill  MS: moves all extremities without noticeable focal  abnormality PSYCH: pleasant and cooperative, no obvious depression or anxiety   ASSESSMENT AND PLAN:  Discussed the following assessment and plan:  Essential hypertension - Plan: Basic metabolic panel  Medication management  Hyperglycemia - Plan: Hemoglobin A1c  Insomnia, unspecified type Her blood pressure reading is  better after sitting large and medium cuff Her thyroid medicine has changed generic since the last time due for thyroid tests lab appointment in future. She is doing well will plan to add BMP and hemoglobin A1c at her next labs and then plan follow-up as needed. Discussed about sleep she has some habit wakening falls asleep with the TV on some stress triggers asked about medication over-the-counter not sure that would be helpful for her Benadryl keeps her awake. Melatonin not that helpful. Reviewed light dark anxieties journaling compartmentalizing her stresses before sleep area and also tried extreme dark. She will try some of these things. Total visit > 50% spent counseling and coordinating care as indicated in above note and in instructions to patient .     -Patient advised to return or notify health care team  if symptoms worsen ,persist or new concerns arise.  Patient Instructions   Attention to sleep  hygeine   Your BP is better   138/72   Exam is good .  Will add  BMP and hg a1c  To your labs in may.     Insomnia Insomnia is a sleep disorder that makes it difficult to fall asleep or to stay asleep. Insomnia can cause tiredness (fatigue), low energy, difficulty concentrating, mood swings, and poor performance at work or school. There are three different ways to classify insomnia:  Difficulty falling asleep.  Difficulty staying asleep.  Waking up too early in the morning. Any type of insomnia can be long-term (chronic) or short-term (acute). Both are common. Short-term insomnia usually lasts for three months or less. Chronic insomnia occurs at least three times a week for longer than three months. What are the causes? Insomnia may be caused by another condition, situation, or substance, such as:  Anxiety.  Certain medicines.  Gastroesophageal reflux disease (GERD) or other gastrointestinal conditions.  Asthma or other breathing conditions.  Restless legs  syndrome, sleep apnea, or other sleep disorders.  Chronic pain.  Menopause. This may include hot flashes.  Stroke.  Abuse of alcohol, tobacco, or illegal drugs.  Depression.  Caffeine.  Neurological disorders, such as Alzheimer disease.  An overactive thyroid (hyperthyroidism). The cause of insomnia may not be known. What increases the risk? Risk factors for insomnia include:  Gender. Women are more commonly affected than men.  Age. Insomnia is more common as you get older.  Stress. This may involve your professional or personal life.  Income. Insomnia is more common in people with lower income.  Lack of exercise.  Irregular work schedule or night shifts.  Traveling between different time zones. What are the  signs or symptoms? If you have insomnia, trouble falling asleep or trouble staying asleep is the main symptom. This may lead to other symptoms, such as:  Feeling fatigued.  Feeling nervous about going to sleep.  Not feeling rested in the morning.  Having trouble concentrating.  Feeling irritable, anxious, or depressed. How is this treated? Treatment for insomnia depends on the cause. If your insomnia is caused by an underlying condition, treatment will focus on addressing the condition. Treatment may also include:  Medicines to help you sleep.  Counseling or therapy.  Lifestyle adjustments. Follow these instructions at home:  Take medicines only as directed by your health care provider.  Keep regular sleeping and waking hours. Avoid naps.  Keep a sleep diary to help you and your health care provider figure out what could be causing your insomnia. Include:  When you sleep.  When you wake up during the night.  How well you sleep.  How rested you feel the next day.  Any side effects of medicines you are taking.  What you eat and drink.  Make your bedroom a comfortable place where it is easy to fall asleep:  Put up shades or special blackout  curtains to block light from outside.  Use a white noise machine to block noise.  Keep the temperature cool.  Exercise regularly as directed by your health care provider. Avoid exercising right before bedtime.  Use relaxation techniques to manage stress. Ask your health care provider to suggest some techniques that may work well for you. These may include:  Breathing exercises.  Routines to release muscle tension.  Visualizing peaceful scenes.  Cut back on alcohol, caffeinated beverages, and cigarettes, especially close to bedtime. These can disrupt your sleep.  Do not overeat or eat spicy foods right before bedtime. This can lead to digestive discomfort that can make it hard for you to sleep.  Limit screen use before bedtime. This includes:  Watching TV.  Using your smartphone, tablet, and computer.  Stick to a routine. This can help you fall asleep faster. Try to do a quiet activity, brush your teeth, and go to bed at the same time each night.  Get out of bed if you are still awake after 15 minutes of trying to sleep. Keep the lights down, but try reading or doing a quiet activity. When you feel sleepy, go back to bed.  Make sure that you drive carefully. Avoid driving if you feel very sleepy.  Keep all follow-up appointments as directed by your health care provider. This is important. Contact a health care provider if:  You are tired throughout the day or have trouble in your daily routine due to sleepiness.  You continue to have sleep problems or your sleep problems get worse. Get help right away if:  You have serious thoughts about hurting yourself or someone else. This information is not intended to replace advice given to you by your health care provider. Make sure you discuss any questions you have with your health care provider. Document Released: 08/12/2000 Document Revised: 01/15/2016 Document Reviewed: 05/16/2014 Elsevier Interactive Patient Education  2017  [employer].       [place]. Panosh M.D.

## 2016-09-28 ENCOUNTER — Encounter: Payer: Self-pay | Admitting: Internal Medicine

## 2016-09-28 ENCOUNTER — Ambulatory Visit (INDEPENDENT_AMBULATORY_CARE_PROVIDER_SITE_OTHER): Payer: Medicare Other | Admitting: Internal Medicine

## 2016-09-28 VITALS — BP 138/72 | Temp 97.9°F | Ht 62.0 in | Wt 178.0 lb

## 2016-09-28 DIAGNOSIS — Z79899 Other long term (current) drug therapy: Secondary | ICD-10-CM

## 2016-09-28 DIAGNOSIS — M9903 Segmental and somatic dysfunction of lumbar region: Secondary | ICD-10-CM | POA: Diagnosis not present

## 2016-09-28 DIAGNOSIS — R739 Hyperglycemia, unspecified: Secondary | ICD-10-CM | POA: Diagnosis not present

## 2016-09-28 DIAGNOSIS — M9901 Segmental and somatic dysfunction of cervical region: Secondary | ICD-10-CM | POA: Diagnosis not present

## 2016-09-28 DIAGNOSIS — M5136 Other intervertebral disc degeneration, lumbar region: Secondary | ICD-10-CM | POA: Diagnosis not present

## 2016-09-28 DIAGNOSIS — G4700 Insomnia, unspecified: Secondary | ICD-10-CM

## 2016-09-28 DIAGNOSIS — I10 Essential (primary) hypertension: Secondary | ICD-10-CM | POA: Diagnosis not present

## 2016-09-28 DIAGNOSIS — M50322 Other cervical disc degeneration at C5-C6 level: Secondary | ICD-10-CM | POA: Diagnosis not present

## 2016-09-28 NOTE — Patient Instructions (Addendum)
Attention to sleep  hygeine   Your BP is better   138/72   Exam is good .  Will add  BMP and hg a1c  To your labs in may.     Insomnia Insomnia is a sleep disorder that makes it difficult to fall asleep or to stay asleep. Insomnia can cause tiredness (fatigue), low energy, difficulty concentrating, mood swings, and poor performance at work or school. There are three different ways to classify insomnia:  Difficulty falling asleep.  Difficulty staying asleep.  Waking up too early in the morning. Any type of insomnia can be long-term (chronic) or short-term (acute). Both are common. Short-term insomnia usually lasts for three months or less. Chronic insomnia occurs at least three times a week for longer than three months. What are the causes? Insomnia may be caused by another condition, situation, or substance, such as:  Anxiety.  Certain medicines.  Gastroesophageal reflux disease (GERD) or other gastrointestinal conditions.  Asthma or other breathing conditions.  Restless legs syndrome, sleep apnea, or other sleep disorders.  Chronic pain.  Menopause. This may include hot flashes.  Stroke.  Abuse of alcohol, tobacco, or illegal drugs.  Depression.  Caffeine.  Neurological disorders, such as Alzheimer disease.  An overactive thyroid (hyperthyroidism). The cause of insomnia may not be known. What increases the risk? Risk factors for insomnia include:  Gender. Women are more commonly affected than men.  Age. Insomnia is more common as you get older.  Stress. This may involve your professional or personal life.  Income. Insomnia is more common in people with lower income.  Lack of exercise.  Irregular work schedule or night shifts.  Traveling between different time zones. What are the signs or symptoms? If you have insomnia, trouble falling asleep or trouble staying asleep is the main symptom. This may lead to other symptoms, such as:  Feeling  fatigued.  Feeling nervous about going to sleep.  Not feeling rested in the morning.  Having trouble concentrating.  Feeling irritable, anxious, or depressed. How is this treated? Treatment for insomnia depends on the cause. If your insomnia is caused by an underlying condition, treatment will focus on addressing the condition. Treatment may also include:  Medicines to help you sleep.  Counseling or therapy.  Lifestyle adjustments. Follow these instructions at home:  Take medicines only as directed by your health care provider.  Keep regular sleeping and waking hours. Avoid naps.  Keep a sleep diary to help you and your health care provider figure out what could be causing your insomnia. Include:  When you sleep.  When you wake up during the night.  How well you sleep.  How rested you feel the next day.  Any side effects of medicines you are taking.  What you eat and drink.  Make your bedroom a comfortable place where it is easy to fall asleep:  Put up shades or special blackout curtains to block light from outside.  Use a white noise machine to block noise.  Keep the temperature cool.  Exercise regularly as directed by your health care provider. Avoid exercising right before bedtime.  Use relaxation techniques to manage stress. Ask your health care provider to suggest some techniques that may work well for you. These may include:  Breathing exercises.  Routines to release muscle tension.  Visualizing peaceful scenes.  Cut back on alcohol, caffeinated beverages, and cigarettes, especially close to bedtime. These can disrupt your sleep.  Do not overeat or eat spicy foods right before  bedtime. This can lead to digestive discomfort that can make it hard for you to sleep.  Limit screen use before bedtime. This includes:  Watching TV.  Using your smartphone, tablet, and computer.  Stick to a routine. This can help you fall asleep faster. Try to do a quiet  activity, brush your teeth, and go to bed at the same time each night.  Get out of bed if you are still awake after 15 minutes of trying to sleep. Keep the lights down, but try reading or doing a quiet activity. When you feel sleepy, go back to bed.  Make sure that you drive carefully. Avoid driving if you feel very sleepy.  Keep all follow-up appointments as directed by your health care provider. This is important. Contact a health care provider if:  You are tired throughout the day or have trouble in your daily routine due to sleepiness.  You continue to have sleep problems or your sleep problems get worse. Get help right away if:  You have serious thoughts about hurting yourself or someone else. This information is not intended to replace advice given to you by your health care provider. Make sure you discuss any questions you have with your health care provider. Document Released: 08/12/2000 Document Revised: 01/15/2016 Document Reviewed: 05/16/2014 Elsevier Interactive Patient Education  2017 [employer].

## 2016-10-06 DIAGNOSIS — M5136 Other intervertebral disc degeneration, lumbar region: Secondary | ICD-10-CM | POA: Diagnosis not present

## 2016-10-06 DIAGNOSIS — M50322 Other cervical disc degeneration at C5-C6 level: Secondary | ICD-10-CM | POA: Diagnosis not present

## 2016-10-06 DIAGNOSIS — M9901 Segmental and somatic dysfunction of cervical region: Secondary | ICD-10-CM | POA: Diagnosis not present

## 2016-10-06 DIAGNOSIS — M9903 Segmental and somatic dysfunction of lumbar region: Secondary | ICD-10-CM | POA: Diagnosis not present

## 2016-10-10 DIAGNOSIS — M9903 Segmental and somatic dysfunction of lumbar region: Secondary | ICD-10-CM | POA: Diagnosis not present

## 2016-10-10 DIAGNOSIS — M9901 Segmental and somatic dysfunction of cervical region: Secondary | ICD-10-CM | POA: Diagnosis not present

## 2016-10-10 DIAGNOSIS — M50322 Other cervical disc degeneration at C5-C6 level: Secondary | ICD-10-CM | POA: Diagnosis not present

## 2016-10-10 DIAGNOSIS — M5136 Other intervertebral disc degeneration, lumbar region: Secondary | ICD-10-CM | POA: Diagnosis not present

## 2016-10-14 DIAGNOSIS — E119 Type 2 diabetes mellitus without complications: Secondary | ICD-10-CM | POA: Diagnosis not present

## 2016-10-14 DIAGNOSIS — H40013 Open angle with borderline findings, low risk, bilateral: Secondary | ICD-10-CM | POA: Diagnosis not present

## 2016-10-14 DIAGNOSIS — H2513 Age-related nuclear cataract, bilateral: Secondary | ICD-10-CM | POA: Diagnosis not present

## 2016-10-14 LAB — HM DIABETES EYE EXAM

## 2016-10-18 DIAGNOSIS — M9901 Segmental and somatic dysfunction of cervical region: Secondary | ICD-10-CM | POA: Diagnosis not present

## 2016-10-18 DIAGNOSIS — M5136 Other intervertebral disc degeneration, lumbar region: Secondary | ICD-10-CM | POA: Diagnosis not present

## 2016-10-18 DIAGNOSIS — M50322 Other cervical disc degeneration at C5-C6 level: Secondary | ICD-10-CM | POA: Diagnosis not present

## 2016-10-18 DIAGNOSIS — M9903 Segmental and somatic dysfunction of lumbar region: Secondary | ICD-10-CM | POA: Diagnosis not present

## 2016-10-26 ENCOUNTER — Encounter: Payer: Self-pay | Admitting: Internal Medicine

## 2016-11-02 DIAGNOSIS — M9903 Segmental and somatic dysfunction of lumbar region: Secondary | ICD-10-CM | POA: Diagnosis not present

## 2016-11-02 DIAGNOSIS — M5136 Other intervertebral disc degeneration, lumbar region: Secondary | ICD-10-CM | POA: Diagnosis not present

## 2016-11-02 DIAGNOSIS — M9901 Segmental and somatic dysfunction of cervical region: Secondary | ICD-10-CM | POA: Diagnosis not present

## 2016-11-02 DIAGNOSIS — M50322 Other cervical disc degeneration at C5-C6 level: Secondary | ICD-10-CM | POA: Diagnosis not present

## 2016-11-10 DIAGNOSIS — M5136 Other intervertebral disc degeneration, lumbar region: Secondary | ICD-10-CM | POA: Diagnosis not present

## 2016-11-10 DIAGNOSIS — M50322 Other cervical disc degeneration at C5-C6 level: Secondary | ICD-10-CM | POA: Diagnosis not present

## 2016-11-10 DIAGNOSIS — M9903 Segmental and somatic dysfunction of lumbar region: Secondary | ICD-10-CM | POA: Diagnosis not present

## 2016-11-10 DIAGNOSIS — M9901 Segmental and somatic dysfunction of cervical region: Secondary | ICD-10-CM | POA: Diagnosis not present

## 2016-11-17 ENCOUNTER — Other Ambulatory Visit: Payer: Self-pay | Admitting: Emergency Medicine

## 2016-11-17 MED ORDER — ATORVASTATIN CALCIUM 20 MG PO TABS: 20.0000 mg | ORAL_TABLET | Freq: Every evening | ORAL | 0 refills | Status: DC

## 2016-11-18 ENCOUNTER — Other Ambulatory Visit: Payer: Self-pay | Admitting: Emergency Medicine

## 2016-11-18 MED ORDER — ATORVASTATIN CALCIUM 20 MG PO TABS: 20.0000 mg | ORAL_TABLET | Freq: Every evening | ORAL | 0 refills | Status: DC

## 2016-11-21 DIAGNOSIS — M5136 Other intervertebral disc degeneration, lumbar region: Secondary | ICD-10-CM | POA: Diagnosis not present

## 2016-11-21 DIAGNOSIS — M50322 Other cervical disc degeneration at C5-C6 level: Secondary | ICD-10-CM | POA: Diagnosis not present

## 2016-11-21 DIAGNOSIS — M9903 Segmental and somatic dysfunction of lumbar region: Secondary | ICD-10-CM | POA: Diagnosis not present

## 2016-11-21 DIAGNOSIS — M9901 Segmental and somatic dysfunction of cervical region: Secondary | ICD-10-CM | POA: Diagnosis not present

## 2016-12-05 DIAGNOSIS — M50322 Other cervical disc degeneration at C5-C6 level: Secondary | ICD-10-CM | POA: Diagnosis not present

## 2016-12-05 DIAGNOSIS — M9903 Segmental and somatic dysfunction of lumbar region: Secondary | ICD-10-CM | POA: Diagnosis not present

## 2016-12-05 DIAGNOSIS — M5136 Other intervertebral disc degeneration, lumbar region: Secondary | ICD-10-CM | POA: Diagnosis not present

## 2016-12-05 DIAGNOSIS — M9901 Segmental and somatic dysfunction of cervical region: Secondary | ICD-10-CM | POA: Diagnosis not present

## 2016-12-06 ENCOUNTER — Other Ambulatory Visit: Payer: Self-pay | Admitting: Internal Medicine

## 2016-12-06 DIAGNOSIS — M6748 Ganglion, other site: Secondary | ICD-10-CM | POA: Diagnosis not present

## 2016-12-06 DIAGNOSIS — L259 Unspecified contact dermatitis, unspecified cause: Secondary | ICD-10-CM | POA: Diagnosis not present

## 2016-12-06 DIAGNOSIS — L820 Inflamed seborrheic keratosis: Secondary | ICD-10-CM | POA: Diagnosis not present

## 2016-12-19 DIAGNOSIS — M5136 Other intervertebral disc degeneration, lumbar region: Secondary | ICD-10-CM | POA: Diagnosis not present

## 2016-12-19 DIAGNOSIS — M50322 Other cervical disc degeneration at C5-C6 level: Secondary | ICD-10-CM | POA: Diagnosis not present

## 2016-12-19 DIAGNOSIS — M9903 Segmental and somatic dysfunction of lumbar region: Secondary | ICD-10-CM | POA: Diagnosis not present

## 2016-12-19 DIAGNOSIS — M9901 Segmental and somatic dysfunction of cervical region: Secondary | ICD-10-CM | POA: Diagnosis not present

## 2016-12-27 ENCOUNTER — Other Ambulatory Visit (INDEPENDENT_AMBULATORY_CARE_PROVIDER_SITE_OTHER): Payer: Medicare Other

## 2016-12-27 DIAGNOSIS — E039 Hypothyroidism, unspecified: Secondary | ICD-10-CM | POA: Diagnosis not present

## 2016-12-27 DIAGNOSIS — R739 Hyperglycemia, unspecified: Secondary | ICD-10-CM | POA: Diagnosis not present

## 2016-12-27 DIAGNOSIS — I10 Essential (primary) hypertension: Secondary | ICD-10-CM | POA: Diagnosis not present

## 2016-12-27 LAB — BASIC METABOLIC PANEL
BUN: 20 mg/dL (ref 6–23)
CALCIUM: 10.1 mg/dL (ref 8.4–10.5)
CO2: 31 meq/L (ref 19–32)
Chloride: 104 mEq/L (ref 96–112)
Creatinine, Ser: 0.88 mg/dL (ref 0.40–1.20)
GFR: 66.97 mL/min (ref >60.00)
Glucose, Bld: 162 mg/dL — ABNORMAL HIGH (ref 70–99)
Potassium: 3.7 mEq/L (ref 3.5–5.1)
SODIUM: 145 meq/L (ref 135–145)

## 2016-12-27 LAB — TSH: TSH: 4.02 u[IU]/mL (ref 0.35–4.50)

## 2016-12-27 LAB — HEMOGLOBIN A1C: HEMOGLOBIN A1C: 7 % — AB (ref 4.6–6.5)

## 2017-01-03 DIAGNOSIS — M9903 Segmental and somatic dysfunction of lumbar region: Secondary | ICD-10-CM | POA: Diagnosis not present

## 2017-01-03 DIAGNOSIS — M5136 Other intervertebral disc degeneration, lumbar region: Secondary | ICD-10-CM | POA: Diagnosis not present

## 2017-01-03 DIAGNOSIS — M9901 Segmental and somatic dysfunction of cervical region: Secondary | ICD-10-CM | POA: Diagnosis not present

## 2017-01-03 DIAGNOSIS — M50322 Other cervical disc degeneration at C5-C6 level: Secondary | ICD-10-CM | POA: Diagnosis not present

## 2017-01-06 ENCOUNTER — Other Ambulatory Visit: Payer: Self-pay | Admitting: Internal Medicine

## 2017-01-10 ENCOUNTER — Encounter: Payer: Self-pay | Admitting: Internal Medicine

## 2017-01-10 ENCOUNTER — Ambulatory Visit (INDEPENDENT_AMBULATORY_CARE_PROVIDER_SITE_OTHER): Payer: Medicare Other | Admitting: Internal Medicine

## 2017-01-10 VITALS — BP 152/90 | HR 80 | Temp 97.6°F | Ht 62.0 in | Wt 175.2 lb

## 2017-01-10 DIAGNOSIS — Z79899 Other long term (current) drug therapy: Secondary | ICD-10-CM | POA: Diagnosis not present

## 2017-01-10 DIAGNOSIS — M542 Cervicalgia: Secondary | ICD-10-CM

## 2017-01-10 DIAGNOSIS — M1990 Unspecified osteoarthritis, unspecified site: Secondary | ICD-10-CM | POA: Diagnosis not present

## 2017-01-10 DIAGNOSIS — E119 Type 2 diabetes mellitus without complications: Secondary | ICD-10-CM | POA: Diagnosis not present

## 2017-01-10 DIAGNOSIS — I10 Essential (primary) hypertension: Secondary | ICD-10-CM | POA: Diagnosis not present

## 2017-01-10 DIAGNOSIS — G4700 Insomnia, unspecified: Secondary | ICD-10-CM

## 2017-01-10 MED ORDER — GABAPENTIN 100 MG PO CAPS: 100.0000 mg | ORAL_CAPSULE | Freq: Every day | ORAL | 3 refills | Status: DC

## 2017-01-10 MED ORDER — BACLOFEN 10 MG PO TABS: 10.0000 mg | ORAL_TABLET | Freq: Two times a day (BID) | ORAL | 1 refills | Status: DC

## 2017-01-10 NOTE — Progress Notes (Signed)
Chief Complaint  Patient presents with  . Follow-up    HPI: Kerry Walters 71 y.o. come in for Chronic disease management  Sleep  bp and hg a1c   Not checking bp readigs    concern about medication Since starting losartan HCTZ is noted When lays on left side  Heart beats hard.  irreg hb at night   No sob. No associated symptoms and feels fine otherwise. Cyst read the insert to the medicine wonders if it's a side effect. Had blood work done Has problematic with sleep and joints neck pain wonders if something like Flexeril can be given her other pain medicine. For sleep She's used the Neurontin takes 2:59 at night which he takes 3 does cause drowsiness. No falling. She also can add some Tylenol at times. She is taking 1500 mg of metformin a day. ROS: See pertinent positives and negatives per HPI.  Past Medical History:  Diagnosis Date  . Asthma due to environmental allergies    no inhaler  . Diverticulosis of colon   . GERD (gastroesophageal reflux disease)   . History of esophageal dilatation    FOR STRICTURE IN 2005  . History of hiatal hernia   . History of hypercalcemia    SECONDARY TO HYPERPARATHYROID  . History of hyperparathyroidism    PRIMARY--  S/P RIGHT PARATHYROIDECTOMY  . History of kidney stones   . History of recurrent UTIs   . Hyperlipidemia   . Hypertension   . Hypothyroidism   . Incomplete right bundle branch block   . Nephrolithiasis    RIGHT   . Nocturia   . PONV (postoperative nausea and vomiting)   . Pre-diabetes   . Sciatica of right side     Family History  Problem Relation Age of Onset  . Cancer Son        liver  . Cancer Father        brain  . Diabetes Sister   . Schizophrenia Son     Social History   Social History  . Marital status: Married    Spouse name: N/A  . Number of children: N/A  . Years of education: N/A   Social History Main Topics  . Smoking status: Never Smoker  . Smokeless tobacco: Never Used  . Alcohol use No   . Drug use: No  . Sexual activity: Not Asked   Other Topics Concern  . None   Social History Narrative   Married   hh of 2   4 dogs   G2 P2   Water exercise.   Retired    [employer]    great grandchild              Outpatient Medications Prior to Visit  Medication Sig Dispense Refill  . atorvastatin (LIPITOR) 20 MG tablet TAKE 1 TABLET BY MOUTH  EVERY EVENING 30 tablet 0  . levothyroxine (SYNTHROID, LEVOTHROID) 75 MCG tablet TAKE 1 TABLET DAILY 5 tablet 0  . losartan-hydrochlorothiazide (HYZAAR) 50-12.5 MG tablet TAKE 1 TABLET BY MOUTH  DAILY 30 tablet 3  . metFORMIN (GLUCOPHAGE-XR) 500 MG 24 hr tablet Take 3 tablets a day. 15 tablet 0  . pantoprazole (PROTONIX) 40 MG tablet Take 1 tablet (40 mg total) by mouth daily. 90 tablet 3  . PREMARIN vaginal cream Place 1 Applicatorful vaginally as directed. Use 2-3 times weekly  prn    . gabapentin (NEURONTIN) 100 MG capsule Take 1-3 capsules (100-300 mg total) by mouth at bedtime. asdirected  90 capsule 1   No facility-administered medications prior to visit.      EXAM:  BP (!) 152/90 (BP Location: Right Arm, Patient Position: Sitting, Cuff Size: Normal)   Pulse 80   Temp 97.6 F (36.4 C) (Oral)   Ht 5\' 2"  (1.575 m)   Wt 175 lb 3.2 oz (79.5 kg)   BMI 32.04 kg/m   Body mass index is 32.04 kg/m.  GENERAL: vitals reviewed and listed above, alert, oriented, appears well hydrated and in no acute distress HEENT: atraumatic, conjunctiva  clear, no obvious abnormalities on inspection of external nose and ears  NECK: no obvious masses on inspection palpation  LUNGS: clear to auscultation bilaterally, no wheezes, rales or rhonchi, good air movement CV: HRRR, no clubbing cyanosis or  peripheral edema nl cap refill  MS: moves all extremities without noticeable focal  Abnormality  PSYCH: pleasant and cooperative, no obvious depression or anxiety Lab Results  Component Value Date   WBC 8.1 08/18/2016   HGB 12.9 08/18/2016   HCT  38.0 08/18/2016   PLT 286.0 08/18/2016   GLUCOSE 162 (H) 12/27/2016   CHOL 142 08/18/2016   TRIG 145.0 08/18/2016   HDL 38.20 (L) 08/18/2016   LDLCALC 74 08/18/2016   ALT 38 (H) 08/18/2016   AST 26 08/18/2016   NA 145 12/27/2016   K 3.7 12/27/2016   CL 104 12/27/2016   CREATININE 0.88 12/27/2016   BUN 20 12/27/2016   CO2 31 12/27/2016   TSH 4.02 12/27/2016   INR 1.12 11/19/2009   HGBA1C 7.0 (H) 12/27/2016   MICROALBUR 6.8 (H) 08/18/2016   BP Readings from Last 3 Encounters:  01/10/17 (!) 152/90  09/28/16 138/72  08/23/16 (!) 152/70   Labs reviewed with patient. Repeat blood pressure still elevated 148/90 range. ASSESSMENT AND PLAN:  Discussed the following assessment and plan:  Essential hypertension - inc to 1.5 tdoay caus just got refilled  doubt palpitatill=n from med but will follow  Medication management  Diabetes mellitus without complication (HCC)  Insomnia, unspecified type - refill gabal for now  Neck pain - refill gabapentin for now  add baclofen  Arthritis Diabetes acceptable with could be better;  sleep is problematic possibly related to musculoskeletal difficulties. We can add baclofen for muscle spasm at night Neurontin Flexeril medicines are high risk in her age group because of sedation possibilities beside other tacks if needed. Continue watching sugar limit. I would like her to come back in the middle of the summer 2 months for blood pressure control evaluation if she is still having what sounds like skipped beats when she lays on her left side at night with no associated symptoms or all the time consider other evaluation. She denies any syncope rapid heart rate or exertional symptoms. Her chest pain. It appears that her potassium is within normal range. -Patient advised to return or notify health care team  if  new concerns arise. Preventive  In about 6 months yearly   . This note was partly reconstructed as Epic was going in and out of access during  the visit.  Patient Instructions   We are change .  the strength of you BP medication   To get better  Control.    Increase t the  BP med to 1.5 per day .   75/18 +  Per day .    Then ROV   Sugar could be better but ok  For now.   Can add baclofen for m spasm Refilling  the gabapentin.  Take tylenol pre bed .   Wt Readings from Last 3 Encounters:  01/10/17 175 lb 3.2 oz (79.5 kg)  09/28/16 178 lb (80.7 kg)  08/23/16 175 lb 6.4 oz (79.6 kg)         Alann Avey K. Scheryl Sanborn M.D.

## 2017-01-10 NOTE — Patient Instructions (Addendum)
We are change .  the strength of you BP medication   To get better  Control.    Increase t the  BP med to 1.5 per day .   75/18 +  Per day .    Then ROV   Sugar could be better but ok  For now.   Can add baclofen for m spasm Refilling the gabapentin.  Take tylenol pre bed .   Wt Readings from Last 3 Encounters:  01/10/17 175 lb 3.2 oz (79.5 kg)  09/28/16 178 lb (80.7 kg)  08/23/16 175 lb 6.4 oz (79.6 kg)

## 2017-01-18 DIAGNOSIS — M9901 Segmental and somatic dysfunction of cervical region: Secondary | ICD-10-CM | POA: Diagnosis not present

## 2017-01-18 DIAGNOSIS — M9903 Segmental and somatic dysfunction of lumbar region: Secondary | ICD-10-CM | POA: Diagnosis not present

## 2017-01-18 DIAGNOSIS — M50322 Other cervical disc degeneration at C5-C6 level: Secondary | ICD-10-CM | POA: Diagnosis not present

## 2017-01-18 DIAGNOSIS — M5136 Other intervertebral disc degeneration, lumbar region: Secondary | ICD-10-CM | POA: Diagnosis not present

## 2017-01-30 DIAGNOSIS — M50322 Other cervical disc degeneration at C5-C6 level: Secondary | ICD-10-CM | POA: Diagnosis not present

## 2017-01-30 DIAGNOSIS — M9901 Segmental and somatic dysfunction of cervical region: Secondary | ICD-10-CM | POA: Diagnosis not present

## 2017-01-30 DIAGNOSIS — M9903 Segmental and somatic dysfunction of lumbar region: Secondary | ICD-10-CM | POA: Diagnosis not present

## 2017-01-30 DIAGNOSIS — M5136 Other intervertebral disc degeneration, lumbar region: Secondary | ICD-10-CM | POA: Diagnosis not present

## 2017-02-08 DIAGNOSIS — M5136 Other intervertebral disc degeneration, lumbar region: Secondary | ICD-10-CM | POA: Diagnosis not present

## 2017-02-08 DIAGNOSIS — M9903 Segmental and somatic dysfunction of lumbar region: Secondary | ICD-10-CM | POA: Diagnosis not present

## 2017-02-08 DIAGNOSIS — M50322 Other cervical disc degeneration at C5-C6 level: Secondary | ICD-10-CM | POA: Diagnosis not present

## 2017-02-08 DIAGNOSIS — M9901 Segmental and somatic dysfunction of cervical region: Secondary | ICD-10-CM | POA: Diagnosis not present

## 2017-02-16 DIAGNOSIS — M5136 Other intervertebral disc degeneration, lumbar region: Secondary | ICD-10-CM | POA: Diagnosis not present

## 2017-02-16 DIAGNOSIS — M50322 Other cervical disc degeneration at C5-C6 level: Secondary | ICD-10-CM | POA: Diagnosis not present

## 2017-02-16 DIAGNOSIS — M9903 Segmental and somatic dysfunction of lumbar region: Secondary | ICD-10-CM | POA: Diagnosis not present

## 2017-02-16 DIAGNOSIS — M9901 Segmental and somatic dysfunction of cervical region: Secondary | ICD-10-CM | POA: Diagnosis not present

## 2017-02-20 ENCOUNTER — Ambulatory Visit (INDEPENDENT_AMBULATORY_CARE_PROVIDER_SITE_OTHER): Payer: Medicare Other

## 2017-02-20 ENCOUNTER — Ambulatory Visit (INDEPENDENT_AMBULATORY_CARE_PROVIDER_SITE_OTHER): Payer: Medicare Other | Admitting: Podiatry

## 2017-02-20 DIAGNOSIS — M109 Gout, unspecified: Secondary | ICD-10-CM

## 2017-02-20 DIAGNOSIS — R52 Pain, unspecified: Secondary | ICD-10-CM

## 2017-02-20 DIAGNOSIS — M779 Enthesopathy, unspecified: Secondary | ICD-10-CM

## 2017-02-20 LAB — CBC WITH DIFFERENTIAL/PLATELET
BASOS ABS: 0 {cells}/uL (ref 0–200)
Basophils Relative: 0 %
EOS ABS: 492 {cells}/uL (ref 15–500)
Eosinophils Relative: 4 %
HEMATOCRIT: 39.1 % (ref 35.0–45.0)
HEMOGLOBIN: 12.9 g/dL (ref 11.7–15.5)
LYMPHS ABS: 2829 {cells}/uL (ref 850–3900)
Lymphocytes Relative: 23 %
MCH: 27.9 pg (ref 27.0–33.0)
MCHC: 33 g/dL (ref 32.0–36.0)
MCV: 84.4 fL (ref 80.0–100.0)
MPV: 9 fL (ref 7.5–12.5)
Monocytes Absolute: 738 cells/uL (ref 200–950)
Monocytes Relative: 6 %
NEUTROS PCT: 67 %
Neutro Abs: 8241 cells/uL — ABNORMAL HIGH (ref 1500–7800)
Platelets: 336 10*3/uL (ref 140–400)
RBC: 4.63 MIL/uL (ref 3.80–5.10)
RDW: 14.9 % (ref 11.0–15.0)
WBC: 12.3 10*3/uL — ABNORMAL HIGH (ref 3.8–10.8)

## 2017-02-20 MED ORDER — COLCHICINE 0.6 MG PO TABS: 0.6000 mg | ORAL_TABLET | Freq: Every day | ORAL | 1 refills | Status: DC

## 2017-02-21 ENCOUNTER — Telehealth: Payer: Self-pay | Admitting: *Deleted

## 2017-02-21 LAB — SEDIMENTATION RATE: Sed Rate: 7 mm/hr (ref 0–30)

## 2017-02-21 LAB — URIC ACID: URIC ACID, SERUM: 6 mg/dL (ref 2.5–7.0)

## 2017-02-21 LAB — C-REACTIVE PROTEIN: CRP: 8.5 mg/L — ABNORMAL HIGH (ref <8.0)

## 2017-02-21 MED ORDER — COLCHICINE 0.6 MG PO TABS: 0.6000 mg | ORAL_TABLET | Freq: Every day | ORAL | 1 refills | Status: DC

## 2017-02-21 NOTE — Telephone Encounter (Signed)
Pt states the prescription from Dr. Ardelle AntonWagoner yesterday is not at the [facility] [address]. I changed the pharmacy to the [facility] and reordered. I informed pt.

## 2017-02-21 NOTE — Progress Notes (Signed)
Subjective:    Patient ID: Kerry Walters, female   DOB: 71 y.o.   MRN: [id number]   HPI 71 year old female presents the office today for concerns of pain, swelling, redness of the right foot. She states that the pain started to the inside aspect of the foot she points in the first MPJ. She states that this started last Wednesday morning suddenly when she went to bed on Tuesday night and was doing fine. Since that she has had swelling. She denies any specific injury to the area. On that she can recall she was doing some gardening but denies injury at that time. She denies any cuts or scrapes on her foot that she has noticed. She denies any change in diet. She's never had a history of gout. Denies any systemic complaints such as fevers, chills, nausea, vomiting. She denies any calf pain, chest pain, shortness of breath. She has no other concerns today.   Review of Systems  All other systems reviewed and are negative.       Objective:  Physical Exam General: AAO x3, NAD  Dermatological: There is localized edema and erythema to the right foot on the first MTPJ with mild erythema to the area. There is no ascending cellulitis. There is no area of fluctuation or crepitus or any malodor. There is no open sores identified.  Vascular: Dorsalis Pedis artery and Posterior Tibial artery pedal pulses are 2/4 bilateral with immedate capillary fill time.  There is no pain with calf compression, swelling, warmth, erythema.   Neruologic: Grossly intact via light touch bilateral. Vibratory intact via tuning fork bilateral. Protective threshold with Semmes Wienstein monofilament intact to all pedal sites bilateral.   Musculoskeletal: There is tenderness the right first MTPJ and his pain with range of motion first MPJ. There is localized edema and erythema along this area and there is no ascending cellulitis. Appears to be a gout flare. No other area of tenderness verified.  Assessment: 71 year old female  right foot capsulitis, likely gout; unlikely infection.  Plan: -Treatment options discussed including all alternatives, risks, and complications -Etiology of symptoms were discussed -X-rays were obtained and reviewed with the patient. No evidence of acute fracture identified. -Discussed likely gout attack. Steroid injection was completed today around the right first MTPJ with mixture of Kenalog and local anesthetic without complications. Post injection care was discussed. -Prescribed colchicine. -Order blood work including CBC, ESR, CRP, uric acid. -She has a CAM boot at home and recommended her to wear this.  -Follow-up as scheduled or sooner if needed. This any worsening call the office immediately. Verbalized understanding  Celesta Gentile, DPM

## 2017-02-22 ENCOUNTER — Telehealth: Payer: Self-pay | Admitting: *Deleted

## 2017-02-22 MED ORDER — CEPHALEXIN 500 MG PO CAPS: 500.0000 mg | ORAL_CAPSULE | Freq: Three times a day (TID) | ORAL | 0 refills | Status: DC

## 2017-02-22 NOTE — Telephone Encounter (Addendum)
-----   Message from Vivi BarrackMatthew R Wagoner, DPM sent at 02/21/2017 10:46 AM EDT ----- CRP is elevated and I still think this is gout. Her WBC is also elevated. Please start keflex 500mg  TID x 10 days in case of infection. Please have her follow-up next week with Dr. Logan BoresEvans to make sure that she is doing well. If she has any issues before or if it gets any worse to call the office ASAP. Thanks. 02/22/2017-I informed pt of Dr. Gabriel RungWagoner's orders and transferred to schedulers.

## 2017-02-28 DIAGNOSIS — M50322 Other cervical disc degeneration at C5-C6 level: Secondary | ICD-10-CM | POA: Diagnosis not present

## 2017-02-28 DIAGNOSIS — M9903 Segmental and somatic dysfunction of lumbar region: Secondary | ICD-10-CM | POA: Diagnosis not present

## 2017-02-28 DIAGNOSIS — M9901 Segmental and somatic dysfunction of cervical region: Secondary | ICD-10-CM | POA: Diagnosis not present

## 2017-02-28 DIAGNOSIS — M5136 Other intervertebral disc degeneration, lumbar region: Secondary | ICD-10-CM | POA: Diagnosis not present

## 2017-03-06 ENCOUNTER — Ambulatory Visit (INDEPENDENT_AMBULATORY_CARE_PROVIDER_SITE_OTHER): Payer: Medicare Other | Admitting: Podiatry

## 2017-03-06 DIAGNOSIS — M109 Gout, unspecified: Secondary | ICD-10-CM

## 2017-03-06 DIAGNOSIS — M7751 Other enthesopathy of right foot: Secondary | ICD-10-CM

## 2017-03-07 DIAGNOSIS — M9903 Segmental and somatic dysfunction of lumbar region: Secondary | ICD-10-CM | POA: Diagnosis not present

## 2017-03-07 DIAGNOSIS — M9904 Segmental and somatic dysfunction of sacral region: Secondary | ICD-10-CM | POA: Diagnosis not present

## 2017-03-07 DIAGNOSIS — M5136 Other intervertebral disc degeneration, lumbar region: Secondary | ICD-10-CM | POA: Diagnosis not present

## 2017-03-07 DIAGNOSIS — M9905 Segmental and somatic dysfunction of pelvic region: Secondary | ICD-10-CM | POA: Diagnosis not present

## 2017-03-13 ENCOUNTER — Ambulatory Visit: Payer: Medicare Other | Admitting: Podiatry

## 2017-03-14 NOTE — Progress Notes (Signed)
Chief Complaint  Patient presents with  . Follow-up    HPI: Kerry GrovesMary Sue Walters 71 y.o. come in for Chronic disease management   bp managments [facility] from last visit  See past notes  Taking 1.5 of  hyzaar 50 /12.5   Only taking lipitor 3 d per week and feels much better for whatever reason  Not checking readings bp at home  Does stress coming in and driving  To get here No falling nero problem '  Had gout in foot  Toe and had shots  X 2 and colchicine not off  Better   No hx of gout in past    Has had sweats flushes since then  ROS: See pertinent positives and negatives per HPI.  Past Medical History:  Diagnosis Date  . Asthma due to environmental allergies    no inhaler  . Diverticulosis of colon   . GERD (gastroesophageal reflux disease)   . History of esophageal dilatation    FOR STRICTURE IN 2005  . History of hiatal hernia   . History of hypercalcemia    SECONDARY TO HYPERPARATHYROID  . History of hyperparathyroidism    PRIMARY--  S/P RIGHT PARATHYROIDECTOMY  . History of kidney stones   . History of recurrent UTIs   . Hyperlipidemia   . Hypertension   . Hypothyroidism   . Incomplete right bundle branch block   . Nephrolithiasis    RIGHT   . Nocturia   . PONV (postoperative nausea and vomiting)   . Pre-diabetes   . Sciatica of right side     Family History  Problem Relation Age of Onset  . Cancer Son        liver  . Cancer Father        brain  . Diabetes Sister   . Schizophrenia Son     Social History   Social History  . Marital status: Married    Spouse name: N/A  . Number of children: N/A  . Years of education: N/A   Social History Main Topics  . Smoking status: Never Smoker  . Smokeless tobacco: Never Used  . Alcohol use No  . Drug use: No  . Sexual activity: Not Asked   Other Topics Concern  . None   Social History Narrative   Married   hh of 2   4 dogs   G2 P2   Water exercise.   Retired    [employer]    great grandchild               Outpatient Medications Prior to Visit  Medication Sig Dispense Refill  . baclofen (LIORESAL) 10 MG tablet Take 1 tablet (10 mg total) by mouth 2 (two) times daily. If needed for muscle spasm 30 each 1  . gabapentin (NEURONTIN) 100 MG capsule Take 1-3 capsules (100-300 mg total) by mouth at bedtime. asdirected 270 capsule 3  . levothyroxine (SYNTHROID, LEVOTHROID) 75 MCG tablet TAKE 1 TABLET DAILY 5 tablet 0  . metFORMIN (GLUCOPHAGE-XR) 500 MG 24 hr tablet Take 3 tablets a day. 15 tablet 0  . pantoprazole (PROTONIX) 40 MG tablet Take 1 tablet (40 mg total) by mouth daily. 90 tablet 3  . PREMARIN vaginal cream Place 1 Applicatorful vaginally as directed. Use 2-3 times weekly  prn    . atorvastatin (LIPITOR) 20 MG tablet TAKE 1 TABLET BY MOUTH  EVERY EVENING 30 tablet 0  . cephALEXin (KEFLEX) 500 MG capsule Take 1 capsule (500 mg total) by  mouth 3 (three) times daily. 30 capsule 0  . colchicine 0.6 MG tablet Take 1 tablet (0.6 mg total) by mouth daily. 10 tablet 1  . losartan-hydrochlorothiazide (HYZAAR) 50-12.5 MG tablet TAKE 1 TABLET BY MOUTH  DAILY 30 tablet 3   No facility-administered medications prior to visit.      EXAM:  BP (!) 148/78 (BP Location: Right Arm)   Pulse 85   Temp 98.2 F (36.8 C) (Oral)   Wt 174 lb (78.9 kg)   BMI 31.83 kg/m   Body mass index is 31.83 kg/m. recheckg bp reg and large cuff high 140s to 150 range    GENERAL: vitals reviewed and listed above, alert, oriented, appears well hydrated and in no acute distress HEENT: atraumatic, conjunctiva  clear, no obvious abnormalities on inspection of external nose and ears OP : no lesion edema or exudate  NECK: no obvious masses on inspection palpation  LUNGS: clear to auscultation bilaterally, no wheezes, rales or rhonchi, good air movement CV: HRRR, no clubbing cyanosis or  peripheral edema nl cap refill  MS: moves all extremities without noticeable focal  abnormality PSYCH: pleasant and cooperative, no  obvious depression or anxiety  BP Readings from Last 3 Encounters:  03/15/17 (!) 148/78  01/10/17 (!) 152/90  09/28/16 138/72   Wt Readings from Last 3 Encounters:  03/15/17 174 lb (78.9 kg)  01/10/17 175 lb 3.2 oz (79.5 kg)  09/28/16 178 lb (80.7 kg)     ASSESSMENT AND PLAN:  Discussed the following assessment and plan:  Essential hypertension - uncertain control see text and fu HBP readings  consdier in to 100/25 ifneeded  - Plan: Basic metabolic panel, CBC with Differential/Platelet, Hemoglobin A1c, Hepatic function panel, Lipid panel, Microalbumin / creatinine urine ratio, TSH  Medication management - Plan: Basic metabolic panel, CBC with Differential/Platelet, Hemoglobin A1c, Hepatic function panel, Lipid panel, Microalbumin / creatinine urine ratio, TSH  Hyperlipidemia, unspecified hyperlipidemia type - Plan: Basic metabolic panel, CBC with Differential/Platelet, Hemoglobin A1c, Hepatic function panel, Lipid panel, Microalbumin / creatinine urine ratio, TSH  Nocturnal leg movements - Plan: Basic metabolic panel, CBC with Differential/Platelet, Hemoglobin A1c, Hepatic function panel, Lipid panel, Microalbumin / creatinine urine ratio, TSH  Hypothyroidism, unspecified type - Plan: Basic metabolic panel, CBC with Differential/Platelet, Hemoglobin A1c, Hepatic function panel, Lipid panel, Microalbumin / creatinine urine ratio, TSH  Hyperglycemia - Plan: Basic metabolic panel, CBC with Differential/Platelet, Hemoglobin A1c, Hepatic function panel, Lipid panel, Microalbumin / creatinine urine ratio, TSH  Hx of gout - Plan: Uric Acid Uncertain cause of flushes  As  pred injection other issues Total visit > 50% spent counseling and coordinating care as indicated in above note and in instructions to patient .    -Patient advised to return or notify health care team  if  new concerns arise.  Patient Instructions  Check your blood pressure readings twice a day for 3-5  days Send in readings that you are getting either mail or fax or  my chart. Depending on the readings we may adjust her medicine again. If the readings are good stay on the same dose of medicine Make a fasting lab appointment in September some time without office visit as we discussed I will put in the orders. Otherwise his blood pressure controlled and labs are good we can do your yearly physical in December wellness visit when convenient.    Neta Mends. Panosh M.D.

## 2017-03-15 ENCOUNTER — Encounter: Payer: Self-pay | Admitting: Internal Medicine

## 2017-03-15 ENCOUNTER — Ambulatory Visit (INDEPENDENT_AMBULATORY_CARE_PROVIDER_SITE_OTHER): Payer: Medicare Other | Admitting: Internal Medicine

## 2017-03-15 VITALS — BP 148/78 | HR 85 | Temp 98.2°F | Wt 174.0 lb

## 2017-03-15 DIAGNOSIS — E039 Hypothyroidism, unspecified: Secondary | ICD-10-CM

## 2017-03-15 DIAGNOSIS — I10 Essential (primary) hypertension: Secondary | ICD-10-CM

## 2017-03-15 DIAGNOSIS — R739 Hyperglycemia, unspecified: Secondary | ICD-10-CM | POA: Diagnosis not present

## 2017-03-15 DIAGNOSIS — R258 Other abnormal involuntary movements: Secondary | ICD-10-CM | POA: Diagnosis not present

## 2017-03-15 DIAGNOSIS — Z79899 Other long term (current) drug therapy: Secondary | ICD-10-CM | POA: Diagnosis not present

## 2017-03-15 DIAGNOSIS — Z8739 Personal history of other diseases of the musculoskeletal system and connective tissue: Secondary | ICD-10-CM | POA: Diagnosis not present

## 2017-03-15 DIAGNOSIS — E785 Hyperlipidemia, unspecified: Secondary | ICD-10-CM | POA: Diagnosis not present

## 2017-03-15 MED ORDER — LOSARTAN POTASSIUM-HCTZ 50-12.5 MG PO TABS: 1.5000 | ORAL_TABLET | Freq: Every day | ORAL | 1 refills | Status: DC

## 2017-03-15 MED ORDER — ATORVASTATIN CALCIUM 20 MG PO TABS: 20.0000 mg | ORAL_TABLET | Freq: Every evening | ORAL | 0 refills | Status: DC

## 2017-03-15 NOTE — Patient Instructions (Addendum)
Check your blood pressure readings twice a day for 3-5 days Send in readings that you are getting either mail or fax or  my chart. Depending on the readings we may adjust her medicine again. If the readings are good stay on the same dose of medicine Make a fasting lab appointment in September some time without office visit as we discussed I will put in the orders. Otherwise his blood pressure controlled and labs are good we can do your yearly physical in December wellness visit when convenient.

## 2017-03-17 ENCOUNTER — Other Ambulatory Visit: Payer: Self-pay | Admitting: Emergency Medicine

## 2017-03-17 ENCOUNTER — Telehealth: Payer: Self-pay | Admitting: Internal Medicine

## 2017-03-17 MED ORDER — ESTROGENS, CONJUGATED 0.625 MG/GM VA CREA: 1.0000 | TOPICAL_CREAM | VAGINAL | 1 refills | Status: DC

## 2017-03-17 NOTE — Telephone Encounter (Signed)
Prescription has been sent to pharmacy.

## 2017-03-17 NOTE — Telephone Encounter (Signed)
Pt would like to see if PREMARIN cream was called in if not she would like to see if it could be called in so that she could have it for the weekend.  Pharm:  [facility] [address]

## 2017-03-21 ENCOUNTER — Telehealth: Payer: Self-pay | Admitting: Internal Medicine

## 2017-03-21 NOTE — Telephone Encounter (Signed)
° ° ° °  Pt called in bp numbers   03/17/17  136/81  03/18/17  148/81  03/19/17 147/81

## 2017-03-22 NOTE — Telephone Encounter (Signed)
Left a VM for patient to give the office a call back.  

## 2017-03-22 NOTE — Telephone Encounter (Signed)
Spoke with patient and gave dr. Fabian SharpPanosh recommendations. Patient understood and had no further questions

## 2017-03-22 NOTE — Telephone Encounter (Signed)
FYI

## 2017-03-22 NOTE — Telephone Encounter (Signed)
Your blood pressure is better than previously however could be improved. At not at goal  Assuming you indeed her taking 1.5 pills of the losartan HCTZ per day Increase to 2 pills a day of the 50/12.5 Send in  readings in another month if doing well we can switch the medicine to 100/25 and take 1 pill a day

## 2017-03-24 MED ORDER — BETAMETHASONE SOD PHOS & ACET 6 (3-3) MG/ML IJ SUSP: 3.0000 mg | Freq: Once | INTRAMUSCULAR | Status: AC

## 2017-03-24 NOTE — Progress Notes (Signed)
   HPI: Patient presents today for follow-up treatment and evaluation regarding capsulitis to the first MTPJ of the right foot likely suspicious for gout. Patient states the pain is improved significantly. Patient weighs of the injection localized into the first MTPJ helped. Patient also believes that the colchicine anti-inflammatory helps well. Patient states that she still has continued slight pain to the right first MTPJ   Physical Exam: General: The patient is alert and oriented x3 in no acute distress.  Dermatology: Skin is warm, dry and supple bilateral lower extremities. Negative for open lesions or macerations.  Vascular: Palpable pedal pulses bilaterally. No edema or erythema noted. Capillary refill within normal limits.  Neurological: Epicritic and protective threshold grossly intact bilaterally.   Musculoskeletal Exam: There is a moderate pain with palpation and range of motion noted to the first MPJ right foot. Range of motion within normal limits to all pedal and ankle joints bilateral. Muscle strength 5/5 in all groups bilateral.   Assessment: 1. First MPJ capsulitis/gout right foot-improved   Plan of Care:  1. Patient was evaluated. Today labs were reviewed. 2. Injection of 0.5 mL Celestone Soluspan injected into the first MTPJ right foot 3. Recommend the patient continue wearing in shoe gear 4. Discussed maintaining healthy diet to avoid high uric acid levels 5. Return to clinic when necessary   Felecia ShellingBrent M. Evans, DPM [facility]  Dr. Felecia ShellingBrent M. Evans, DPM    [address]                                        [place], [place] [address]                Office [phone]  Fax [phone]

## 2017-04-03 DIAGNOSIS — M9904 Segmental and somatic dysfunction of sacral region: Secondary | ICD-10-CM | POA: Diagnosis not present

## 2017-04-03 DIAGNOSIS — M9903 Segmental and somatic dysfunction of lumbar region: Secondary | ICD-10-CM | POA: Diagnosis not present

## 2017-04-03 DIAGNOSIS — M5136 Other intervertebral disc degeneration, lumbar region: Secondary | ICD-10-CM | POA: Diagnosis not present

## 2017-04-03 DIAGNOSIS — M9905 Segmental and somatic dysfunction of pelvic region: Secondary | ICD-10-CM | POA: Diagnosis not present

## 2017-04-19 ENCOUNTER — Ambulatory Visit (INDEPENDENT_AMBULATORY_CARE_PROVIDER_SITE_OTHER): Payer: Medicare Other | Admitting: Podiatry

## 2017-04-19 DIAGNOSIS — M9904 Segmental and somatic dysfunction of sacral region: Secondary | ICD-10-CM | POA: Diagnosis not present

## 2017-04-19 DIAGNOSIS — M7751 Other enthesopathy of right foot: Secondary | ICD-10-CM

## 2017-04-19 DIAGNOSIS — M5136 Other intervertebral disc degeneration, lumbar region: Secondary | ICD-10-CM | POA: Diagnosis not present

## 2017-04-19 DIAGNOSIS — M109 Gout, unspecified: Secondary | ICD-10-CM

## 2017-04-19 DIAGNOSIS — M9905 Segmental and somatic dysfunction of pelvic region: Secondary | ICD-10-CM | POA: Diagnosis not present

## 2017-04-19 DIAGNOSIS — M9903 Segmental and somatic dysfunction of lumbar region: Secondary | ICD-10-CM | POA: Diagnosis not present

## 2017-04-19 MED ORDER — ALLOPURINOL 100 MG PO TABS: 100.0000 mg | ORAL_TABLET | Freq: Every day | ORAL | 6 refills | Status: DC

## 2017-04-19 MED ORDER — COLCHICINE 0.6 MG PO TABS: 0.6000 mg | ORAL_TABLET | Freq: Every day | ORAL | 1 refills | Status: DC

## 2017-04-29 MED ORDER — BETAMETHASONE SOD PHOS & ACET 6 (3-3) MG/ML IJ SUSP: 3.0000 mg | Freq: Once | INTRAMUSCULAR | Status: AC

## 2017-04-29 NOTE — Progress Notes (Signed)
   HPI: Patient presents today for follow-up evaluation of gout with capsulitis to the first MTPJ of the right foot. Patient was last seen on 03/06/2017. She believes that today she has a possible gout flareup. She had pain and swelling in the right great toe for the past week. She presents today for follow-up treatment and evaluation   Physical Exam: General: The patient is alert and oriented x3 in no acute distress.  Dermatology: Skin is warm, dry and supple bilateral lower extremities. Negative for open lesions or macerations.  Vascular: Palpable pedal pulses bilaterally. No edema or erythema noted. Capillary refill within normal limits.  Neurological: Epicritic and protective threshold grossly intact bilaterally.   Musculoskeletal Exam: There is a moderate pain with palpation and range of motion noted to the first MPJ right foot. Range of motion within normal limits to all pedal and ankle joints bilateral. Muscle strength 5/5 in all groups bilateral.   Assessment: 1. First MPJ capsulitis/gout right foo  Plan of Care:  1. Patient was evaluated.  2. Injection of 0.5 mL Celestone Soluspan injected into the first MTPJ right foot 3. Prescription for colchicine 0.6 mg 4. Prescription for allopurinol due to recurrent gout attacks 5. Return to clinic when necessary  Felecia ShellingBrent M. Evans, DPM [facility]  Dr. Felecia ShellingBrent M. Evans, DPM    [address]                                        [place], [place] [address]                Office [phone]  Fax [phone]

## 2017-05-03 ENCOUNTER — Other Ambulatory Visit: Payer: Self-pay | Admitting: Internal Medicine

## 2017-05-05 DIAGNOSIS — M9901 Segmental and somatic dysfunction of cervical region: Secondary | ICD-10-CM | POA: Diagnosis not present

## 2017-05-05 DIAGNOSIS — M9903 Segmental and somatic dysfunction of lumbar region: Secondary | ICD-10-CM | POA: Diagnosis not present

## 2017-05-05 DIAGNOSIS — M50322 Other cervical disc degeneration at C5-C6 level: Secondary | ICD-10-CM | POA: Diagnosis not present

## 2017-05-05 DIAGNOSIS — M5136 Other intervertebral disc degeneration, lumbar region: Secondary | ICD-10-CM | POA: Diagnosis not present

## 2017-05-08 DIAGNOSIS — M50322 Other cervical disc degeneration at C5-C6 level: Secondary | ICD-10-CM | POA: Diagnosis not present

## 2017-05-08 DIAGNOSIS — M9903 Segmental and somatic dysfunction of lumbar region: Secondary | ICD-10-CM | POA: Diagnosis not present

## 2017-05-08 DIAGNOSIS — M5136 Other intervertebral disc degeneration, lumbar region: Secondary | ICD-10-CM | POA: Diagnosis not present

## 2017-05-08 DIAGNOSIS — M9901 Segmental and somatic dysfunction of cervical region: Secondary | ICD-10-CM | POA: Diagnosis not present

## 2017-05-09 ENCOUNTER — Other Ambulatory Visit (INDEPENDENT_AMBULATORY_CARE_PROVIDER_SITE_OTHER): Payer: Medicare Other

## 2017-05-09 DIAGNOSIS — I10 Essential (primary) hypertension: Secondary | ICD-10-CM

## 2017-05-09 DIAGNOSIS — Z8739 Personal history of other diseases of the musculoskeletal system and connective tissue: Secondary | ICD-10-CM

## 2017-05-09 DIAGNOSIS — E039 Hypothyroidism, unspecified: Secondary | ICD-10-CM | POA: Diagnosis not present

## 2017-05-09 DIAGNOSIS — E785 Hyperlipidemia, unspecified: Secondary | ICD-10-CM | POA: Diagnosis not present

## 2017-05-09 DIAGNOSIS — R258 Other abnormal involuntary movements: Secondary | ICD-10-CM

## 2017-05-09 DIAGNOSIS — Z79899 Other long term (current) drug therapy: Secondary | ICD-10-CM | POA: Diagnosis not present

## 2017-05-09 DIAGNOSIS — R739 Hyperglycemia, unspecified: Secondary | ICD-10-CM

## 2017-05-09 LAB — CBC WITH DIFFERENTIAL/PLATELET
BASOS PCT: 0.6 % (ref 0.0–3.0)
Basophils Absolute: 0 10*3/uL (ref 0.0–0.1)
Eosinophils Absolute: 0.3 10*3/uL (ref 0.0–0.7)
Eosinophils Relative: 4.2 % (ref 0.0–5.0)
HEMATOCRIT: 38.2 % (ref 36.0–46.0)
Hemoglobin: 12.9 g/dL (ref 12.0–15.0)
LYMPHS PCT: 29.7 % (ref 12.0–46.0)
Lymphs Abs: 2 10*3/uL (ref 0.7–4.0)
MCHC: 33.8 g/dL (ref 30.0–36.0)
MCV: 85.4 fl (ref 78.0–100.0)
MONO ABS: 0.4 10*3/uL (ref 0.1–1.0)
Monocytes Relative: 5.9 % (ref 3.0–12.0)
NEUTROS ABS: 3.9 10*3/uL (ref 1.4–7.7)
Neutrophils Relative %: 59.6 % (ref 43.0–77.0)
PLATELETS: 244 10*3/uL (ref 150.0–400.0)
RBC: 4.48 Mil/uL (ref 3.87–5.11)
RDW: 14.5 % (ref 11.5–15.5)
WBC: 6.6 10*3/uL (ref 4.0–10.5)

## 2017-05-09 LAB — HEPATIC FUNCTION PANEL
ALBUMIN: 4 g/dL (ref 3.5–5.2)
ALT: 27 U/L (ref 0–35)
AST: 22 U/L (ref 0–37)
Alkaline Phosphatase: 69 U/L (ref 39–117)
BILIRUBIN TOTAL: 0.4 mg/dL (ref 0.2–1.2)
Bilirubin, Direct: 0.1 mg/dL (ref 0.0–0.3)
TOTAL PROTEIN: 6.5 g/dL (ref 6.0–8.3)

## 2017-05-09 LAB — LIPID PANEL
CHOLESTEROL: 168 mg/dL (ref 0–200)
HDL: 37.2 mg/dL — ABNORMAL LOW (ref >39.00)
LDL CALC: 105 mg/dL — AB (ref 0–99)
NonHDL: 131.22
TRIGLYCERIDES: 129 mg/dL (ref 0.0–149.0)
Total CHOL/HDL Ratio: 5
VLDL: 25.8 mg/dL (ref 0.0–40.0)

## 2017-05-09 LAB — BASIC METABOLIC PANEL
BUN: 25 mg/dL — AB (ref 6–23)
CHLORIDE: 101 meq/L (ref 96–112)
CO2: 33 meq/L — AB (ref 19–32)
CREATININE: 1.05 mg/dL (ref 0.40–1.20)
Calcium: 9.8 mg/dL (ref 8.4–10.5)
GFR: 54.57 mL/min — ABNORMAL LOW (ref >60.00)
GLUCOSE: 148 mg/dL — AB (ref 70–99)
POTASSIUM: 3.6 meq/L (ref 3.5–5.1)
Sodium: 143 mEq/L (ref 135–145)

## 2017-05-09 LAB — MICROALBUMIN / CREATININE URINE RATIO
CREATININE, U: 151.7 mg/dL
MICROALB/CREAT RATIO: 20.6 mg/g (ref 0.0–30.0)
Microalb, Ur: 31.2 mg/dL — ABNORMAL HIGH (ref 0.0–1.9)

## 2017-05-09 LAB — TSH: TSH: 2.92 u[IU]/mL (ref 0.35–4.50)

## 2017-05-09 LAB — HEMOGLOBIN A1C: Hgb A1c MFr Bld: 6.7 % — ABNORMAL HIGH (ref 4.6–6.5)

## 2017-05-09 LAB — URIC ACID: URIC ACID, SERUM: 5.4 mg/dL (ref 2.4–7.0)

## 2017-05-18 DIAGNOSIS — Z1231 Encounter for screening mammogram for malignant neoplasm of breast: Secondary | ICD-10-CM | POA: Diagnosis not present

## 2017-05-18 LAB — HM MAMMOGRAPHY

## 2017-05-22 DIAGNOSIS — L570 Actinic keratosis: Secondary | ICD-10-CM | POA: Diagnosis not present

## 2017-05-24 ENCOUNTER — Encounter: Payer: Self-pay | Admitting: Internal Medicine

## 2017-05-25 ENCOUNTER — Other Ambulatory Visit: Payer: Self-pay | Admitting: Internal Medicine

## 2017-05-26 NOTE — Telephone Encounter (Signed)
Medication filled to pharmacy as requested.   

## 2017-05-29 DIAGNOSIS — M5136 Other intervertebral disc degeneration, lumbar region: Secondary | ICD-10-CM | POA: Diagnosis not present

## 2017-05-29 DIAGNOSIS — M50322 Other cervical disc degeneration at C5-C6 level: Secondary | ICD-10-CM | POA: Diagnosis not present

## 2017-05-29 DIAGNOSIS — M9901 Segmental and somatic dysfunction of cervical region: Secondary | ICD-10-CM | POA: Diagnosis not present

## 2017-05-29 DIAGNOSIS — M9903 Segmental and somatic dysfunction of lumbar region: Secondary | ICD-10-CM | POA: Diagnosis not present

## 2017-06-06 DIAGNOSIS — M9903 Segmental and somatic dysfunction of lumbar region: Secondary | ICD-10-CM | POA: Diagnosis not present

## 2017-06-06 DIAGNOSIS — M50322 Other cervical disc degeneration at C5-C6 level: Secondary | ICD-10-CM | POA: Diagnosis not present

## 2017-06-06 DIAGNOSIS — M5136 Other intervertebral disc degeneration, lumbar region: Secondary | ICD-10-CM | POA: Diagnosis not present

## 2017-06-06 DIAGNOSIS — M9901 Segmental and somatic dysfunction of cervical region: Secondary | ICD-10-CM | POA: Diagnosis not present

## 2017-06-15 DIAGNOSIS — M50322 Other cervical disc degeneration at C5-C6 level: Secondary | ICD-10-CM | POA: Diagnosis not present

## 2017-06-15 DIAGNOSIS — M9903 Segmental and somatic dysfunction of lumbar region: Secondary | ICD-10-CM | POA: Diagnosis not present

## 2017-06-15 DIAGNOSIS — M9901 Segmental and somatic dysfunction of cervical region: Secondary | ICD-10-CM | POA: Diagnosis not present

## 2017-06-15 DIAGNOSIS — M5136 Other intervertebral disc degeneration, lumbar region: Secondary | ICD-10-CM | POA: Diagnosis not present

## 2017-06-20 DIAGNOSIS — M50322 Other cervical disc degeneration at C5-C6 level: Secondary | ICD-10-CM | POA: Diagnosis not present

## 2017-06-20 DIAGNOSIS — M9903 Segmental and somatic dysfunction of lumbar region: Secondary | ICD-10-CM | POA: Diagnosis not present

## 2017-06-20 DIAGNOSIS — M9901 Segmental and somatic dysfunction of cervical region: Secondary | ICD-10-CM | POA: Diagnosis not present

## 2017-06-20 DIAGNOSIS — M5136 Other intervertebral disc degeneration, lumbar region: Secondary | ICD-10-CM | POA: Diagnosis not present

## 2017-06-30 ENCOUNTER — Ambulatory Visit (INDEPENDENT_AMBULATORY_CARE_PROVIDER_SITE_OTHER): Payer: Medicare Other

## 2017-06-30 DIAGNOSIS — Z23 Encounter for immunization: Secondary | ICD-10-CM

## 2017-07-03 DIAGNOSIS — M9901 Segmental and somatic dysfunction of cervical region: Secondary | ICD-10-CM | POA: Diagnosis not present

## 2017-07-03 DIAGNOSIS — N200 Calculus of kidney: Secondary | ICD-10-CM | POA: Diagnosis not present

## 2017-07-03 DIAGNOSIS — M9903 Segmental and somatic dysfunction of lumbar region: Secondary | ICD-10-CM | POA: Diagnosis not present

## 2017-07-03 DIAGNOSIS — M5136 Other intervertebral disc degeneration, lumbar region: Secondary | ICD-10-CM | POA: Diagnosis not present

## 2017-07-03 DIAGNOSIS — M50322 Other cervical disc degeneration at C5-C6 level: Secondary | ICD-10-CM | POA: Diagnosis not present

## 2017-07-13 DIAGNOSIS — M50322 Other cervical disc degeneration at C5-C6 level: Secondary | ICD-10-CM | POA: Diagnosis not present

## 2017-07-13 DIAGNOSIS — M9901 Segmental and somatic dysfunction of cervical region: Secondary | ICD-10-CM | POA: Diagnosis not present

## 2017-07-13 DIAGNOSIS — M9903 Segmental and somatic dysfunction of lumbar region: Secondary | ICD-10-CM | POA: Diagnosis not present

## 2017-07-13 DIAGNOSIS — M5136 Other intervertebral disc degeneration, lumbar region: Secondary | ICD-10-CM | POA: Diagnosis not present

## 2017-07-18 DIAGNOSIS — M9905 Segmental and somatic dysfunction of pelvic region: Secondary | ICD-10-CM | POA: Diagnosis not present

## 2017-07-18 DIAGNOSIS — M9904 Segmental and somatic dysfunction of sacral region: Secondary | ICD-10-CM | POA: Diagnosis not present

## 2017-07-18 DIAGNOSIS — M9903 Segmental and somatic dysfunction of lumbar region: Secondary | ICD-10-CM | POA: Diagnosis not present

## 2017-07-18 DIAGNOSIS — M5136 Other intervertebral disc degeneration, lumbar region: Secondary | ICD-10-CM | POA: Diagnosis not present

## 2017-08-01 ENCOUNTER — Other Ambulatory Visit: Payer: Self-pay | Admitting: Internal Medicine

## 2017-08-12 ENCOUNTER — Other Ambulatory Visit: Payer: Self-pay | Admitting: Internal Medicine

## 2017-08-17 DIAGNOSIS — M9902 Segmental and somatic dysfunction of thoracic region: Secondary | ICD-10-CM | POA: Diagnosis not present

## 2017-08-17 DIAGNOSIS — M5134 Other intervertebral disc degeneration, thoracic region: Secondary | ICD-10-CM | POA: Diagnosis not present

## 2017-08-27 ENCOUNTER — Other Ambulatory Visit: Payer: Self-pay | Admitting: Internal Medicine

## 2017-08-28 NOTE — Progress Notes (Signed)
Chief Complaint  Patient presents with  . Annual Exam    Hacky cough x 1 week. Using cough suppressants. Scratchy throat. Denies runny nose and PND.     HPI: Kerry Walters 71 y.o. comes in today for Preventive Medicare exam/ wellness visit . And Chronic disease management   Acid reflux actin g up  for a month  Stinging an burning   noeating different  No dysphagia still on protonix ? Not working as well . Trying to not eat past 7   Has been on other ppis    Scratchy throat no sob taking  Cough med at night for past week  New  . Has wood stove  Usually ok gets used to it  After initiating .   Bp no se of med  DM refill met formin  No numbness or vision change  Atorva  taking  Pod still on allopurinol for gout suppression no flare   Not on colchicine  Last UA was at goal   Health Maintenance  Topic Date Due  . Hepatitis C Screening  [birth year]  . DEXA SCAN  02/14/2009  . FOOT EXAM  08/23/2017  . OPHTHALMOLOGY EXAM  10/14/2017  . HEMOGLOBIN A1C  11/06/2017  . MAMMOGRAM  05/19/2019  . Fecal DNA (Cologuard)  09/01/2019  . TETANUS/TDAP  11/23/2024  . INFLUENZA VACCINE  Completed  . PNA vac Low Risk Adult  Completed   Health Maintenance Review LIFESTYLE:  Exercise:    Nothing for now .     Tobacco/ETS:  no Alcohol:   no Sugar beverages: some tea ocass     Sleep:   Not very many 5     Drug use: no HH:   2  Pet   3 dogs    Wood stove     Hearing:  Ok   Vision:  No limitations at present . Last eye check UTD  Safety:  Has smoke detector and wears seat belts.firearms.  Safely No excess sun exposure. Sees dentist regularly.    Advance directive :  Reviewed  Has one.  Memory: Felt to be good  , no concern from her or her family.  Depression: No anhedonia unusual crying or depressive symptoms  Nutrition: Eats well balanced diet; adequate calcium and vitamin D. No swallowing chewing problems.  Injury: no major injuries in the last six months.  Other healthcare  providers:  Reviewed today .  Social:  Lives with spouse married.  3 dogs . [name]   Preventive parameters: up-to-date  Reviewed   ADLS:   There are no problems or need for assistance  driving, feeding, obtaining food, dressing, toileting and bathing, managing money using phone. She is independent.   ROS:  GEN/ HEENT: No fever, significant weight changes sweats headaches vision problems hearing changes, CV/ PULM; No chest pain shortness of breath  syncope,edema  change in exercise tolerance. GI /GU: No adominal pain, vomiting, change in bowel habits. No blood in the stool. No significant GU symptoms. SKIN/HEME: ,no acute skin rashes suspicious lesions or bleeding. No lymphadenopathy, nodules, masses.  NEURO/ PSYCH:  No neurologic signs such as weakness numbness. No depression anxiety. IMM/ Allergy: No unusual infections.  Allergy .   REST of 12 system review negative except as per HPI   Past Medical History:  Diagnosis Date  . Asthma due to environmental allergies    no inhaler  . Diverticulosis of colon   . GERD (gastroesophageal reflux disease)   . History of  esophageal dilatation    FOR STRICTURE IN 2005  . History of hiatal hernia   . History of hypercalcemia    SECONDARY TO HYPERPARATHYROID  . History of hyperparathyroidism    PRIMARY--  S/P RIGHT PARATHYROIDECTOMY  . History of kidney stones   . History of recurrent UTIs   . Hyperlipidemia   . Hypertension   . Hypothyroidism   . Incomplete right bundle branch block   . Nephrolithiasis    RIGHT   . Nocturia   . PONV (postoperative nausea and vomiting)   . Pre-diabetes   . Sciatica of right side     Family History  Problem Relation Age of Onset  . Cancer Son        liver  . Cancer Father        brain  . Diabetes Sister   . Schizophrenia Son     Social History   Socioeconomic History  . Marital status: Married    Spouse name: None  . Number of children: None  . Years of education: None  . Highest education  level: None  Social Needs  . Financial resource strain: None  . Food insecurity - worry: None  . Food insecurity - inability: None  . Transportation needs - medical: None  . Transportation needs - non-medical: None  Occupational History  . None  Tobacco Use  . Smoking status: Never Smoker  . Smokeless tobacco: Never Used  Substance and Sexual Activity  . Alcohol use: No  . Drug use: No  . Sexual activity: None  Other Topics Concern  . None  Social History Narrative   Married   hh of 2   4 dogs   G2 P2   Water exercise.   Retired    [employer]    great grandchild           Outpatient Encounter Medications as of 08/30/2017  Medication Sig  . allopurinol (ZYLOPRIM) 100 MG tablet Take 1 tablet (100 mg total) by mouth daily.  [name] atorvastatin (LIPITOR) 20 MG tablet Take 1 tablet (20 mg total) by mouth every evening.  . conjugated estrogens (PREMARIN) vaginal cream Place 1 Applicatorful vaginally as directed. Use 2-3 times weekly  prn  . gabapentin (NEURONTIN) 100 MG capsule Take 1-3 capsules (100-300 mg total) by mouth at bedtime. asdirected  . levothyroxine (SYNTHROID, LEVOTHROID) 75 MCG tablet Take 1 tablet (75 mcg total) by mouth daily.  [name] losartan-hydrochlorothiazide (HYZAAR) 50-12.5 MG tablet Take 1.5 tablets by mouth daily.  . metFORMIN (GLUCOPHAGE-XR) 500 MG 24 hr tablet TAKE 3 TABLETS BY MOUTH A  DAY  . pantoprazole (PROTONIX) 40 MG tablet Take 1 tablet (40 mg total) by mouth daily.  . [DISCONTINUED] atorvastatin (LIPITOR) 20 MG tablet TAKE 1 TABLET BY MOUTH  EVERY EVENING  . [DISCONTINUED] levothyroxine (SYNTHROID, LEVOTHROID) 75 MCG tablet TAKE 1 TABLET BY MOUTH  DAILY  . [DISCONTINUED] losartan-hydrochlorothiazide (HYZAAR) 50-12.5 MG tablet TAKE 1 AND 1/2 TABLETS BY  MOUTH DAILY  . [DISCONTINUED] metFORMIN (GLUCOPHAGE-XR) 500 MG 24 hr tablet TAKE 3 TABLETS BY MOUTH A  DAY  . [DISCONTINUED] pantoprazole (PROTONIX) 40 MG tablet TAKE 1 TABLET BY MOUTH  DAILY  . [DISCONTINUED]  baclofen (LIORESAL) 10 MG tablet Take 1 tablet (10 mg total) by mouth 2 (two) times daily. If needed for muscle spasm (Patient not taking: Reported on 08/30/2017)  . [DISCONTINUED] colchicine 0.6 MG tablet Take 1 tablet (0.6 mg total) by mouth daily. (Patient not taking: Reported on 08/30/2017)  Facility-Administered Encounter Medications as of 08/30/2017  Medication  . betamethasone acetate-betamethasone sodium phosphate (CELESTONE) injection 3 mg  . betamethasone acetate-betamethasone sodium phosphate (CELESTONE) injection 3 mg    EXAM:  BP 134/80 (BP Location: Left Arm, Patient Position: Sitting, Cuff Size: Large)   Pulse 75   Temp 97.6 F (36.4 C) (Oral)   Ht 5' 1.61" (1.565 m)   Wt 171 lb 1.6 oz (77.6 kg)   BMI 31.69 kg/m   Body mass index is 31.69 kg/m.  Physical Exam: Vital signs reviewed [id number] is a well-developed well-nourished alert cooperative   who appears stated age in no acute distress.  HEENT: normocephalic atraumatic , Eyes: PERRL EOM's full, conjunctiva clear, Nares: paten,t no deformity discharge or tenderness., Ears: no deformity EAC's clear TMs with normal landmarks. Mouth: clear OP, no lesions, edema.  Moist mucous membranes. Dentition in adequate repair. NECK: supple without masses, thyromegaly or bruits. CHEST/PULM:  Clear to auscultation and percussion breath sounds equal no wheeze , rales or rhonchi. No chest wall deformities or tenderness. CV: PMI is nondisplaced, S1 S2 no gallops, murmurs, rubs. Peripheral pulses are full without delay.No JVD . Breast: normal by inspection . No dimpling, discharge, masses, tenderness or discharge . ABDOMEN: Bowel sounds normal nontender  No guard or rebound, no hepato splenomegal no CVA tenderness.   Extremtities:  No clubbing cyanosis or edema, no acute joint swelling or redness no focal atrophy NEURO:  Oriented x3, cranial nerves 3-12 appear to be intact, no obvious focal weakness,gait within normal limits no abnormal  reflexes or asymmetrical SKIN: No acute rashes normal turgor, color, no bruising or petechiae. PSYCH: Oriented, good eye contact, no obvious depression anxiety, cognition and judgment appear normal. LN: no cervical axillary  adenopathy No noted deficits in memory, attention, and speech. Diabetic Foot Exam - Simple   Simple Foot Form Diabetic Foot exam was performed with the following findings:  Yes 08/30/2017 10:32 AM  Visual Inspection No deformities, no ulcerations, no other skin breakdown bilaterally:  Yes Sensation Testing Intact to touch and monofilament testing bilaterally:  Yes Pulse Check Posterior Tibialis and Dorsalis pulse intact bilaterally:  Yes Comments      Lab Results  Component Value Date   WBC 6.6 05/09/2017   HGB 12.9 05/09/2017   HCT 38.2 05/09/2017   PLT 244.0 05/09/2017   GLUCOSE 148 (H) 05/09/2017   CHOL 168 05/09/2017   TRIG 129.0 05/09/2017   HDL 37.20 (L) 05/09/2017   LDLCALC 105 (H) 05/09/2017   ALT 27 05/09/2017   AST 22 05/09/2017   NA 143 05/09/2017   K 3.6 05/09/2017   CL 101 05/09/2017   CREATININE 1.05 05/09/2017   BUN 25 (H) 05/09/2017   CO2 33 (H) 05/09/2017   TSH 2.92 05/09/2017   INR 1.12 11/19/2009   HGBA1C 6.7 08/30/2017   MICROALBUR 31.2 (H) 05/09/2017    ASSESSMENT AND PLAN:  Discussed the following assessment and plan:  Visit for preventive health examination  Medication management  Diabetes mellitus without complication ([place]) - Plan: POC HgB A1c, POC Glucose (CBG)  Essential hypertension  Hypothyroidism, unspecified type  Hyperlipidemia, unspecified hyperlipidemia type  Hx of gout  Gastroesophageal reflux disease, esophagitis presence not specified  Cough Advise staying on Protonix intensification of antireflux measures.  And follow-up in 2-3 months if not helpful or gets alarm symptoms.  At this time she has been on 2 or 3 other PPIs and would do a GI referral if we are not successful in controlling  symptoms. Continue diabetes management controlled hypertension thyroid and hyperlipidemia.  Plan follow-up visit in 6 months or earlier if reflux not controlled. Patient Care Team: Panosh, Standley Brooking, MD as PCP - General [facility] Vonna Kotyk, MD (Orthopedic Surgery) Druscilla Brownie, MD (Dermatology) Clent Jacks, MD (Ophthalmology) Lowella Bandy, MD as Consulting Physician (Urology) Dr Dondra Prader chiropractor   Patient Instructions    attend to diet changes and stay on the Protonix    If not improving in the next  2 months contact us   Fu visit for Follow up .   Consider seeing a GI specialsit   Wt Readings from Last 3 Encounters:  08/30/17 171 lb 1.6 oz (77.6 kg)  03/15/17 174 lb (78.9 kg)  01/10/17 175 lb 3.2 oz (79.5 kg)   Med refill today        Food Choices for Gastroesophageal Reflux Disease, Adult When you have gastroesophageal reflux disease (GERD), the foods you eat and your eating habits are very important. Choosing the right foods can help ease the discomfort of GERD. Consider working with a diet and nutrition specialist (dietitian) to help you make healthy food choices. What general guidelines should I follow? Eating plan  Choose healthy foods low in fat, such as fruits, vegetables, whole grains, low-fat dairy products, and lean meat, fish, and poultry.  Eat frequent, small meals instead of three large meals each day. Eat your meals slowly, in a relaxed setting. Avoid bending over or lying down until 2-3 hours after eating.  Limit high-fat foods such as fatty meats or fried foods.  Limit your intake of oils, butter, and shortening to less than 8 teaspoons each day.  Avoid the following: ? Foods that cause symptoms. These may be different for different people. Keep a food diary to keep track of foods that cause symptoms. ? Alcohol. ? Drinking large amounts of liquid with meals. ? Eating meals during the 2-3 hours before bed.  Cook foods using methods other than  frying. This may include baking, grilling, or broiling. Lifestyle   Maintain a healthy weight. Ask your health care provider what weight is healthy for you. If you need to lose weight, work with your health care provider to do so safely.  Exercise for at least 30 minutes on 5 or more days each week, or as told by your health care provider.  Avoid wearing clothes that fit tightly around your waist and chest.  Do not use any products that contain nicotine or tobacco, such as cigarettes and e-cigarettes. If you need help quitting, ask your health care provider.  Sleep with the head of your bed raised. Use a wedge under the mattress or blocks under the bed frame to raise the head of the bed. What foods are not recommended? The items listed may not be a complete list. Talk with your dietitian about what dietary choices are best for you. Grains Pastries or quick breads with added fat. [place] toast. Vegetables Deep fried vegetables. [place] fries. Any vegetables prepared with added fat. Any vegetables that cause symptoms. For some people this may include tomatoes and tomato products, chili peppers, onions and garlic, and horseradish. Fruits Any fruits prepared with added fat. Any fruits that cause symptoms. For some people this may include citrus fruits, such as oranges, grapefruit, pineapple, and lemons. Meats and other protein foods High-fat meats, such as fatty beef or pork, hot dogs, ribs, ham, sausage, salami and bacon. Fried meat or protein, including fried fish and fried chicken. Nuts and  nut butters. Dairy Whole milk and chocolate milk. Sour cream. Cream. Ice cream. Cream cheese. Milk shakes. Beverages Coffee and tea, with or without caffeine. Carbonated beverages. Sodas. Energy drinks. Fruit juice made with acidic fruits (such as orange or grapefruit). Tomato juice. Alcoholic drinks. Fats and oils Butter. Margarine. Shortening. Ghee. Sweets and desserts Chocolate and cocoa.  Donuts. Seasoning and other foods Pepper. Peppermint and spearmint. Any condiments, herbs, or seasonings that cause symptoms. For some people, this may include curry, hot sauce, or vinegar-based salad dressings. Summary  When you have gastroesophageal reflux disease (GERD), food and lifestyle choices are very important to help ease the discomfort of GERD.  Eat frequent, small meals instead of three large meals each day. Eat your meals slowly, in a relaxed setting. Avoid bending over or lying down until 2-3 hours after eating.  Limit high-fat foods such as fatty meat or fried foods. This information is not intended to replace advice given to you by your health care provider. Make sure you discuss any questions you have with your health care provider. Document Released: 08/15/2005 Document Revised: 08/16/2016 Document Reviewed: 08/16/2016 Elsevier Interactive Patient Education  2018 [place] K. Panosh M.D.

## 2017-08-30 ENCOUNTER — Ambulatory Visit (INDEPENDENT_AMBULATORY_CARE_PROVIDER_SITE_OTHER): Payer: Medicare Other | Admitting: Internal Medicine

## 2017-08-30 ENCOUNTER — Encounter: Payer: Self-pay | Admitting: Internal Medicine

## 2017-08-30 VITALS — BP 134/80 | HR 75 | Temp 97.6°F | Ht 61.61 in | Wt 171.1 lb

## 2017-08-30 DIAGNOSIS — E039 Hypothyroidism, unspecified: Secondary | ICD-10-CM

## 2017-08-30 DIAGNOSIS — Z79899 Other long term (current) drug therapy: Secondary | ICD-10-CM | POA: Diagnosis not present

## 2017-08-30 DIAGNOSIS — Z8739 Personal history of other diseases of the musculoskeletal system and connective tissue: Secondary | ICD-10-CM | POA: Diagnosis not present

## 2017-08-30 DIAGNOSIS — I10 Essential (primary) hypertension: Secondary | ICD-10-CM | POA: Diagnosis not present

## 2017-08-30 DIAGNOSIS — R05 Cough: Secondary | ICD-10-CM | POA: Diagnosis not present

## 2017-08-30 DIAGNOSIS — Z0001 Encounter for general adult medical examination with abnormal findings: Secondary | ICD-10-CM

## 2017-08-30 DIAGNOSIS — E785 Hyperlipidemia, unspecified: Secondary | ICD-10-CM | POA: Diagnosis not present

## 2017-08-30 DIAGNOSIS — K219 Gastro-esophageal reflux disease without esophagitis: Secondary | ICD-10-CM | POA: Diagnosis not present

## 2017-08-30 DIAGNOSIS — M5134 Other intervertebral disc degeneration, thoracic region: Secondary | ICD-10-CM | POA: Diagnosis not present

## 2017-08-30 DIAGNOSIS — E119 Type 2 diabetes mellitus without complications: Secondary | ICD-10-CM | POA: Diagnosis not present

## 2017-08-30 DIAGNOSIS — M9902 Segmental and somatic dysfunction of thoracic region: Secondary | ICD-10-CM | POA: Diagnosis not present

## 2017-08-30 DIAGNOSIS — Z0000 Encounter for general adult medical examination without abnormal findings: Secondary | ICD-10-CM

## 2017-08-30 DIAGNOSIS — R059 Cough, unspecified: Secondary | ICD-10-CM

## 2017-08-30 LAB — POCT GLYCOSYLATED HEMOGLOBIN (HGB A1C): Hemoglobin A1C: 6.7

## 2017-08-30 LAB — GLUCOSE, POCT (MANUAL RESULT ENTRY): POC GLUCOSE: 137 mg/dL — AB (ref 70–99)

## 2017-08-30 MED ORDER — METFORMIN HCL ER 500 MG PO TB24: ORAL_TABLET | ORAL | 3 refills | Status: DC

## 2017-08-30 MED ORDER — ATORVASTATIN CALCIUM 20 MG PO TABS: 20.0000 mg | ORAL_TABLET | Freq: Every evening | ORAL | 3 refills | Status: DC

## 2017-08-30 MED ORDER — LOSARTAN POTASSIUM-HCTZ 50-12.5 MG PO TABS: 1.5000 | ORAL_TABLET | Freq: Every day | ORAL | 3 refills | Status: DC

## 2017-08-30 MED ORDER — PANTOPRAZOLE SODIUM 40 MG PO TBEC: 40.0000 mg | DELAYED_RELEASE_TABLET | Freq: Every day | ORAL | 3 refills | Status: DC

## 2017-08-30 MED ORDER — LEVOTHYROXINE SODIUM 75 MCG PO TABS: 75.0000 ug | ORAL_TABLET | Freq: Every day | ORAL | 3 refills | Status: DC

## 2017-08-30 NOTE — Patient Instructions (Addendum)
attend to diet changes and stay on the Protonix    If not improving in the next  2 months contact us   Fu visit for Follow up .   Consider seeing a GI specialsit   Wt Readings from Last 3 Encounters:  08/30/17 171 lb 1.6 oz (77.6 kg)  03/15/17 174 lb (78.9 kg)  01/10/17 175 lb 3.2 oz (79.5 kg)   Med refill today        Food Choices for Gastroesophageal Reflux Disease, Adult When you have gastroesophageal reflux disease (GERD), the foods you eat and your eating habits are very important. Choosing the right foods can help ease the discomfort of GERD. Consider working with a diet and nutrition specialist (dietitian) to help you make healthy food choices. What general guidelines should I follow? Eating plan  Choose healthy foods low in fat, such as fruits, vegetables, whole grains, low-fat dairy products, and lean meat, fish, and poultry.  Eat frequent, small meals instead of three large meals each day. Eat your meals slowly, in a relaxed setting. Avoid bending over or lying down until 2-3 hours after eating.  Limit high-fat foods such as fatty meats or fried foods.  Limit your intake of oils, butter, and shortening to less than 8 teaspoons each day.  Avoid the following: ? Foods that cause symptoms. These may be different for different people. Keep a food diary to keep track of foods that cause symptoms. ? Alcohol. ? Drinking large amounts of liquid with meals. ? Eating meals during the 2-3 hours before bed.  Cook foods using methods other than frying. This may include baking, grilling, or broiling. Lifestyle   Maintain a healthy weight. Ask your health care provider what weight is healthy for you. If you need to lose weight, work with your health care provider to do so safely.  Exercise for at least 30 minutes on 5 or more days each week, or as told by your health care provider.  Avoid wearing clothes that fit tightly around your waist and chest.  Do not use any products  that contain nicotine or tobacco, such as cigarettes and e-cigarettes. If you need help quitting, ask your health care provider.  Sleep with the head of your bed raised. Use a wedge under the mattress or blocks under the bed frame to raise the head of the bed. What foods are not recommended? The items listed may not be a complete list. Talk with your dietitian about what dietary choices are best for you. Grains Pastries or quick breads with added fat. [place] toast. Vegetables Deep fried vegetables. [place] fries. Any vegetables prepared with added fat. Any vegetables that cause symptoms. For some people this may include tomatoes and tomato products, chili peppers, onions and garlic, and horseradish. Fruits Any fruits prepared with added fat. Any fruits that cause symptoms. For some people this may include citrus fruits, such as oranges, grapefruit, pineapple, and lemons. Meats and other protein foods High-fat meats, such as fatty beef or pork, hot dogs, ribs, ham, sausage, salami and bacon. Fried meat or protein, including fried fish and fried chicken. Nuts and nut butters. Dairy Whole milk and chocolate milk. Sour cream. Cream. Ice cream. Cream cheese. Milk shakes. Beverages Coffee and tea, with or without caffeine. Carbonated beverages. Sodas. Energy drinks. Fruit juice made with acidic fruits (such as orange or grapefruit). Tomato juice. Alcoholic drinks. Fats and oils Butter. Margarine. Shortening. Ghee. Sweets and desserts Chocolate and cocoa. Donuts. Seasoning and other foods Pepper. Peppermint  and spearmint. Any condiments, herbs, or seasonings that cause symptoms. For some people, this may include curry, hot sauce, or vinegar-based salad dressings. Summary  When you have gastroesophageal reflux disease (GERD), food and lifestyle choices are very important to help ease the discomfort of GERD.  Eat frequent, small meals instead of three large meals each day. Eat your meals slowly, in  a relaxed setting. Avoid bending over or lying down until 2-3 hours after eating.  Limit high-fat foods such as fatty meat or fried foods. This information is not intended to replace advice given to you by your health care provider. Make sure you discuss any questions you have with your health care provider. Document Released: 08/15/2005 Document Revised: 08/16/2016 Document Reviewed: 08/16/2016 Elsevier Interactive Patient Education  [employer].  .la

## 2017-09-04 DIAGNOSIS — M9902 Segmental and somatic dysfunction of thoracic region: Secondary | ICD-10-CM | POA: Diagnosis not present

## 2017-09-04 DIAGNOSIS — M5134 Other intervertebral disc degeneration, thoracic region: Secondary | ICD-10-CM | POA: Diagnosis not present

## 2017-09-13 DIAGNOSIS — M9902 Segmental and somatic dysfunction of thoracic region: Secondary | ICD-10-CM | POA: Diagnosis not present

## 2017-09-13 DIAGNOSIS — M5134 Other intervertebral disc degeneration, thoracic region: Secondary | ICD-10-CM | POA: Diagnosis not present

## 2017-09-28 DIAGNOSIS — M50322 Other cervical disc degeneration at C5-C6 level: Secondary | ICD-10-CM | POA: Diagnosis not present

## 2017-09-28 DIAGNOSIS — M9901 Segmental and somatic dysfunction of cervical region: Secondary | ICD-10-CM | POA: Diagnosis not present

## 2017-09-28 DIAGNOSIS — M9903 Segmental and somatic dysfunction of lumbar region: Secondary | ICD-10-CM | POA: Diagnosis not present

## 2017-09-28 DIAGNOSIS — M5136 Other intervertebral disc degeneration, lumbar region: Secondary | ICD-10-CM | POA: Diagnosis not present

## 2017-10-10 ENCOUNTER — Telehealth: Payer: Self-pay | Admitting: *Deleted

## 2017-10-10 NOTE — Telephone Encounter (Signed)
Refill request for allopurinol. Dr. Logan BoresEvans states pt needs an appt if having pain. Return fax denying.

## 2017-10-11 DIAGNOSIS — M9901 Segmental and somatic dysfunction of cervical region: Secondary | ICD-10-CM | POA: Diagnosis not present

## 2017-10-11 DIAGNOSIS — M50322 Other cervical disc degeneration at C5-C6 level: Secondary | ICD-10-CM | POA: Diagnosis not present

## 2017-10-11 DIAGNOSIS — M5136 Other intervertebral disc degeneration, lumbar region: Secondary | ICD-10-CM | POA: Diagnosis not present

## 2017-10-11 DIAGNOSIS — M9903 Segmental and somatic dysfunction of lumbar region: Secondary | ICD-10-CM | POA: Diagnosis not present

## 2017-10-17 DIAGNOSIS — E119 Type 2 diabetes mellitus without complications: Secondary | ICD-10-CM | POA: Diagnosis not present

## 2017-10-17 DIAGNOSIS — H40013 Open angle with borderline findings, low risk, bilateral: Secondary | ICD-10-CM | POA: Diagnosis not present

## 2017-10-17 DIAGNOSIS — H2513 Age-related nuclear cataract, bilateral: Secondary | ICD-10-CM | POA: Diagnosis not present

## 2017-10-17 DIAGNOSIS — H43811 Vitreous degeneration, right eye: Secondary | ICD-10-CM | POA: Diagnosis not present

## 2017-10-17 LAB — HM DIABETES EYE EXAM

## 2017-10-20 ENCOUNTER — Telehealth: Payer: Self-pay | Admitting: *Deleted

## 2017-10-20 NOTE — Telephone Encounter (Signed)
Refill request Allopurinol. Dr. Logan BoresEvans states pt has not been evaluated since 03/2017, needs and appt. Return fax denying.

## 2017-10-25 DIAGNOSIS — M50322 Other cervical disc degeneration at C5-C6 level: Secondary | ICD-10-CM | POA: Diagnosis not present

## 2017-10-25 DIAGNOSIS — M9901 Segmental and somatic dysfunction of cervical region: Secondary | ICD-10-CM | POA: Diagnosis not present

## 2017-10-25 DIAGNOSIS — M5136 Other intervertebral disc degeneration, lumbar region: Secondary | ICD-10-CM | POA: Diagnosis not present

## 2017-10-25 DIAGNOSIS — M9903 Segmental and somatic dysfunction of lumbar region: Secondary | ICD-10-CM | POA: Diagnosis not present

## 2017-11-09 ENCOUNTER — Encounter: Payer: Self-pay | Admitting: Internal Medicine

## 2017-11-13 DIAGNOSIS — M5136 Other intervertebral disc degeneration, lumbar region: Secondary | ICD-10-CM | POA: Diagnosis not present

## 2017-11-13 DIAGNOSIS — M9901 Segmental and somatic dysfunction of cervical region: Secondary | ICD-10-CM | POA: Diagnosis not present

## 2017-11-13 DIAGNOSIS — M9903 Segmental and somatic dysfunction of lumbar region: Secondary | ICD-10-CM | POA: Diagnosis not present

## 2017-11-13 DIAGNOSIS — M50322 Other cervical disc degeneration at C5-C6 level: Secondary | ICD-10-CM | POA: Diagnosis not present

## 2017-11-20 ENCOUNTER — Telehealth: Payer: Self-pay | Admitting: Internal Medicine

## 2017-11-20 MED ORDER — VALSARTAN-HYDROCHLOROTHIAZIDE 160-12.5 MG PO TABS: 1.0000 | ORAL_TABLET | Freq: Every day | ORAL | 0 refills | Status: DC

## 2017-11-20 NOTE — Telephone Encounter (Signed)
Try  Valsartan 160/12.5  As  Pended

## 2017-11-20 NOTE — Telephone Encounter (Signed)
Alternatives are:  Valsartan ( recall recently lifted) Telmisartan Olmesartan  Please advise Dr Fabian SharpPanosh, thanks.

## 2017-11-20 NOTE — Telephone Encounter (Signed)
Rx sent to [facility] per patient request Pt to let us know if too costly or not tolerated.  Nothing further needed.

## 2017-11-20 NOTE — Telephone Encounter (Signed)
Copied from CRM [phone]. Topic: Quick Communication - See Telephone Encounter >> Nov 20, 2017  8:29 AM Oneal GroutSebastian, Jennifer S wrote: CRM for notification. See Telephone encounter for: 11/20/17. Per pt [facility] has contacted her, recall on losartan-hydrochlorothiazide (HYZAAR) 50-12.5 MG tablet. [facility]

## 2017-11-21 DIAGNOSIS — M5136 Other intervertebral disc degeneration, lumbar region: Secondary | ICD-10-CM | POA: Diagnosis not present

## 2017-11-21 DIAGNOSIS — M9901 Segmental and somatic dysfunction of cervical region: Secondary | ICD-10-CM | POA: Diagnosis not present

## 2017-11-21 DIAGNOSIS — M9903 Segmental and somatic dysfunction of lumbar region: Secondary | ICD-10-CM | POA: Diagnosis not present

## 2017-11-21 DIAGNOSIS — M50322 Other cervical disc degeneration at C5-C6 level: Secondary | ICD-10-CM | POA: Diagnosis not present

## 2017-11-27 DIAGNOSIS — M5136 Other intervertebral disc degeneration, lumbar region: Secondary | ICD-10-CM | POA: Diagnosis not present

## 2017-11-27 DIAGNOSIS — M50322 Other cervical disc degeneration at C5-C6 level: Secondary | ICD-10-CM | POA: Diagnosis not present

## 2017-11-27 DIAGNOSIS — M9901 Segmental and somatic dysfunction of cervical region: Secondary | ICD-10-CM | POA: Diagnosis not present

## 2017-11-27 DIAGNOSIS — M9903 Segmental and somatic dysfunction of lumbar region: Secondary | ICD-10-CM | POA: Diagnosis not present

## 2017-12-01 DIAGNOSIS — N3000 Acute cystitis without hematuria: Secondary | ICD-10-CM | POA: Diagnosis not present

## 2017-12-05 DIAGNOSIS — M9901 Segmental and somatic dysfunction of cervical region: Secondary | ICD-10-CM | POA: Diagnosis not present

## 2017-12-05 DIAGNOSIS — M50322 Other cervical disc degeneration at C5-C6 level: Secondary | ICD-10-CM | POA: Diagnosis not present

## 2017-12-05 DIAGNOSIS — M5136 Other intervertebral disc degeneration, lumbar region: Secondary | ICD-10-CM | POA: Diagnosis not present

## 2017-12-05 DIAGNOSIS — M9903 Segmental and somatic dysfunction of lumbar region: Secondary | ICD-10-CM | POA: Diagnosis not present

## 2017-12-25 DIAGNOSIS — M9901 Segmental and somatic dysfunction of cervical region: Secondary | ICD-10-CM | POA: Diagnosis not present

## 2017-12-25 DIAGNOSIS — M9903 Segmental and somatic dysfunction of lumbar region: Secondary | ICD-10-CM | POA: Diagnosis not present

## 2017-12-25 DIAGNOSIS — M50322 Other cervical disc degeneration at C5-C6 level: Secondary | ICD-10-CM | POA: Diagnosis not present

## 2017-12-25 DIAGNOSIS — M5136 Other intervertebral disc degeneration, lumbar region: Secondary | ICD-10-CM | POA: Diagnosis not present

## 2018-01-02 DIAGNOSIS — M5136 Other intervertebral disc degeneration, lumbar region: Secondary | ICD-10-CM | POA: Diagnosis not present

## 2018-01-02 DIAGNOSIS — M9901 Segmental and somatic dysfunction of cervical region: Secondary | ICD-10-CM | POA: Diagnosis not present

## 2018-01-02 DIAGNOSIS — M50322 Other cervical disc degeneration at C5-C6 level: Secondary | ICD-10-CM | POA: Diagnosis not present

## 2018-01-02 DIAGNOSIS — M9903 Segmental and somatic dysfunction of lumbar region: Secondary | ICD-10-CM | POA: Diagnosis not present

## 2018-01-08 ENCOUNTER — Other Ambulatory Visit: Payer: Self-pay | Admitting: Internal Medicine

## 2018-01-15 DIAGNOSIS — M9901 Segmental and somatic dysfunction of cervical region: Secondary | ICD-10-CM | POA: Diagnosis not present

## 2018-01-15 DIAGNOSIS — M5136 Other intervertebral disc degeneration, lumbar region: Secondary | ICD-10-CM | POA: Diagnosis not present

## 2018-01-15 DIAGNOSIS — M50322 Other cervical disc degeneration at C5-C6 level: Secondary | ICD-10-CM | POA: Diagnosis not present

## 2018-01-15 DIAGNOSIS — M9903 Segmental and somatic dysfunction of lumbar region: Secondary | ICD-10-CM | POA: Diagnosis not present

## 2018-01-25 DIAGNOSIS — M50322 Other cervical disc degeneration at C5-C6 level: Secondary | ICD-10-CM | POA: Diagnosis not present

## 2018-01-25 DIAGNOSIS — M5136 Other intervertebral disc degeneration, lumbar region: Secondary | ICD-10-CM | POA: Diagnosis not present

## 2018-01-25 DIAGNOSIS — M9903 Segmental and somatic dysfunction of lumbar region: Secondary | ICD-10-CM | POA: Diagnosis not present

## 2018-01-25 DIAGNOSIS — M9901 Segmental and somatic dysfunction of cervical region: Secondary | ICD-10-CM | POA: Diagnosis not present

## 2018-01-31 DIAGNOSIS — M4312 Spondylolisthesis, cervical region: Secondary | ICD-10-CM | POA: Diagnosis not present

## 2018-01-31 DIAGNOSIS — M50322 Other cervical disc degeneration at C5-C6 level: Secondary | ICD-10-CM | POA: Diagnosis not present

## 2018-01-31 DIAGNOSIS — M47812 Spondylosis without myelopathy or radiculopathy, cervical region: Secondary | ICD-10-CM | POA: Diagnosis not present

## 2018-02-01 DIAGNOSIS — M9903 Segmental and somatic dysfunction of lumbar region: Secondary | ICD-10-CM | POA: Diagnosis not present

## 2018-02-01 DIAGNOSIS — M9901 Segmental and somatic dysfunction of cervical region: Secondary | ICD-10-CM | POA: Diagnosis not present

## 2018-02-01 DIAGNOSIS — M5136 Other intervertebral disc degeneration, lumbar region: Secondary | ICD-10-CM | POA: Diagnosis not present

## 2018-02-01 DIAGNOSIS — M50322 Other cervical disc degeneration at C5-C6 level: Secondary | ICD-10-CM | POA: Diagnosis not present

## 2018-02-08 DIAGNOSIS — M9903 Segmental and somatic dysfunction of lumbar region: Secondary | ICD-10-CM | POA: Diagnosis not present

## 2018-02-08 DIAGNOSIS — M9901 Segmental and somatic dysfunction of cervical region: Secondary | ICD-10-CM | POA: Diagnosis not present

## 2018-02-08 DIAGNOSIS — M50322 Other cervical disc degeneration at C5-C6 level: Secondary | ICD-10-CM | POA: Diagnosis not present

## 2018-02-08 DIAGNOSIS — M5136 Other intervertebral disc degeneration, lumbar region: Secondary | ICD-10-CM | POA: Diagnosis not present

## 2018-02-13 DIAGNOSIS — M9903 Segmental and somatic dysfunction of lumbar region: Secondary | ICD-10-CM | POA: Diagnosis not present

## 2018-02-13 DIAGNOSIS — M9901 Segmental and somatic dysfunction of cervical region: Secondary | ICD-10-CM | POA: Diagnosis not present

## 2018-02-13 DIAGNOSIS — M5136 Other intervertebral disc degeneration, lumbar region: Secondary | ICD-10-CM | POA: Diagnosis not present

## 2018-02-13 DIAGNOSIS — M50322 Other cervical disc degeneration at C5-C6 level: Secondary | ICD-10-CM | POA: Diagnosis not present

## 2018-02-27 DIAGNOSIS — M9901 Segmental and somatic dysfunction of cervical region: Secondary | ICD-10-CM | POA: Diagnosis not present

## 2018-02-27 DIAGNOSIS — M50322 Other cervical disc degeneration at C5-C6 level: Secondary | ICD-10-CM | POA: Diagnosis not present

## 2018-02-27 DIAGNOSIS — M9903 Segmental and somatic dysfunction of lumbar region: Secondary | ICD-10-CM | POA: Diagnosis not present

## 2018-02-27 DIAGNOSIS — M5136 Other intervertebral disc degeneration, lumbar region: Secondary | ICD-10-CM | POA: Diagnosis not present

## 2018-02-28 ENCOUNTER — Encounter: Payer: Self-pay | Admitting: Internal Medicine

## 2018-02-28 ENCOUNTER — Other Ambulatory Visit: Payer: Self-pay | Admitting: Family Medicine

## 2018-02-28 ENCOUNTER — Ambulatory Visit: Payer: Medicare Other | Admitting: Internal Medicine

## 2018-02-28 VITALS — BP 160/70 | HR 82 | Temp 98.6°F | Wt 170.0 lb

## 2018-02-28 DIAGNOSIS — E119 Type 2 diabetes mellitus without complications: Secondary | ICD-10-CM

## 2018-02-28 DIAGNOSIS — E785 Hyperlipidemia, unspecified: Secondary | ICD-10-CM

## 2018-02-28 DIAGNOSIS — R053 Chronic cough: Secondary | ICD-10-CM

## 2018-02-28 DIAGNOSIS — R05 Cough: Secondary | ICD-10-CM

## 2018-02-28 DIAGNOSIS — R6889 Other general symptoms and signs: Secondary | ICD-10-CM | POA: Diagnosis not present

## 2018-02-28 DIAGNOSIS — Z79899 Other long term (current) drug therapy: Secondary | ICD-10-CM | POA: Diagnosis not present

## 2018-02-28 DIAGNOSIS — G4700 Insomnia, unspecified: Secondary | ICD-10-CM

## 2018-02-28 DIAGNOSIS — I10 Essential (primary) hypertension: Secondary | ICD-10-CM

## 2018-02-28 DIAGNOSIS — R0989 Other specified symptoms and signs involving the circulatory and respiratory systems: Secondary | ICD-10-CM

## 2018-02-28 MED ORDER — PANTOPRAZOLE SODIUM 40 MG PO TBEC: 40.0000 mg | DELAYED_RELEASE_TABLET | Freq: Two times a day (BID) | ORAL | 2 refills | Status: DC

## 2018-02-28 MED ORDER — VALSARTAN-HYDROCHLOROTHIAZIDE 160-25 MG PO TABS: 1.0000 | ORAL_TABLET | Freq: Every day | ORAL | 3 refills | Status: DC

## 2018-02-28 MED ORDER — ESTROGENS, CONJUGATED 0.625 MG/GM VA CREA: 1.0000 | TOPICAL_CREAM | VAGINAL | 1 refills | Status: DC

## 2018-02-28 NOTE — Patient Instructions (Addendum)
Ok to try the protonix for twice a day  And then decide  If helps the throat clearing and cough .    Get the chest x ray when possibel at the [place] office  X ray.   Change BP formulation.   Get fasting lab appt    Continue lifestyle intervention healthy eating and exercise .  Plan  ROV in   4 months.

## 2018-02-28 NOTE — Progress Notes (Signed)
Chief Complaint  Patient presents with  . Follow-up    HPI: Kerry Walters 72 y.o. come in for Chronic disease management   6 mos check  DM  No checking .but taking 1500 metformin  HT    No checking   Readings  makesit goes up. Thyroid no change GOUT. No sx  Eating more sugar around b day month ROS: See pertinent positives and negatives per HPI. No cpsob has chornic cough and throat clearing on protonix ? Can shd try inc  To bid  Is problematic   Past Medical History:  Diagnosis Date  . Asthma due to environmental allergies    no inhaler  . Diverticulosis of colon   . GERD (gastroesophageal reflux disease)   . History of esophageal dilatation    FOR STRICTURE IN 2005  . History of hiatal hernia   . History of hypercalcemia    SECONDARY TO HYPERPARATHYROID  . History of hyperparathyroidism    PRIMARY--  S/P RIGHT PARATHYROIDECTOMY  . History of kidney stones   . History of recurrent UTIs   . Hyperlipidemia   . Hypertension   . Hypothyroidism   . Incomplete right bundle branch block   . Nephrolithiasis    RIGHT   . Nocturia   . PONV (postoperative nausea and vomiting)   . Pre-diabetes   . Sciatica of right side     Family History  Problem Relation Age of Onset  . Cancer Son        liver  . Cancer Father        brain  . Diabetes Sister   . Schizophrenia Son     Social History   Socioeconomic History  . Marital status: Married    Spouse name: Not on file  . Number of children: Not on file  . Years of education: Not on file  . Highest education level: Not on file  Occupational History  . Not on file  Social Needs  . Financial resource strain: Not on file  . Food insecurity:    Worry: Not on file    Inability: Not on file  . Transportation needs:    Medical: Not on file    Non-medical: Not on file  Tobacco Use  . Smoking status: Never Smoker  . Smokeless tobacco: Never Used  Substance and Sexual Activity  . Alcohol use: No  . Drug use: No  .  Sexual activity: Not on file  Lifestyle  . Physical activity:    Days per week: Not on file    Minutes per session: Not on file  . Stress: Not on file  Relationships  . Social connections:    Talks on phone: Not on file    Gets together: Not on file    Attends religious service: Not on file    Active member of club or organization: Not on file    Attends meetings of clubs or organizations: Not on file    Relationship status: Not on file  Other Topics Concern  . Not on file  Social History Narrative   Married   hh of 2   4 dogs   G2 P2   Water exercise.   Retired    [employer]    great grandchild           Outpatient Medications Prior to Visit  Medication Sig Dispense Refill  . atorvastatin (LIPITOR) 20 MG tablet Take 1 tablet (20 mg total) by mouth every evening. 90 tablet  3  . gabapentin (NEURONTIN) 100 MG capsule Take 1-3 capsules (100-300 mg total) by mouth at bedtime. asdirected 270 capsule 3  . levothyroxine (SYNTHROID, LEVOTHROID) 75 MCG tablet Take 1 tablet (75 mcg total) by mouth daily. 90 tablet 3  . metFORMIN (GLUCOPHAGE-XR) 500 MG 24 hr tablet TAKE 3 TABLETS BY MOUTH A  DAY 270 tablet 3  . conjugated estrogens (PREMARIN) vaginal cream Place 1 Applicatorful vaginally as directed. Use 2-3 times weekly  prn 42.5 g 1  . pantoprazole (PROTONIX) 40 MG tablet Take 1 tablet (40 mg total) by mouth daily. 90 tablet 3  . valsartan-hydrochlorothiazide (DIOVAN-HCT) 160-12.5 MG tablet TAKE 1 TABLET BY MOUTH  DAILY 90 tablet 0  . allopurinol (ZYLOPRIM) 100 MG tablet Take 1 tablet (100 mg total) by mouth daily. 30 tablet 6   Facility-Administered Medications Prior to Visit  Medication Dose Route Frequency Provider Last Rate Last Dose  . betamethasone acetate-betamethasone sodium phosphate (CELESTONE) injection 3 mg  3 mg Intramuscular Once Gala LewandowskyEvans, Brent M, DPM      . betamethasone acetate-betamethasone sodium phosphate (CELESTONE) injection 3 mg  3 mg Intramuscular Once Evans, Brent  M, DPM         EXAM:  BP (!) 160/70 (BP Location: Right Arm, Patient Position: Sitting)   Pulse 82   Temp 98.6 F (37 C)   Wt 170 lb (77.1 kg)   BMI 31.48 kg/m   Body mass index is 31.48 kg/m.  GENERAL: vitals reviewed and listed above, alert, oriented, appears well hydrated and in no acute distress HEENT: atraumatic, conjunctiva  clear, no obvious abnormalities on inspection of external nose and ears OP : no lesion edema or exudate  NECK: no obvious masses on inspection palpation  LUNGS: clear to auscultation bilaterally, no wheezes, rales or rhonchi, good air movement CV: HRRR, no clubbing cyanosis or  peripheral edema nl cap refill  Abdomen:  Sof,t normal bowel sounds without hepatosplenomegaly, no guarding rebound or masses no CVA tenderness MS: moves all extremities without noticeable focal  abnormality PSYCH: pleasant and cooperative, no obvious depression or anxiety Lab Results  Component Value Date   WBC 6.6 05/09/2017   HGB 12.9 05/09/2017   HCT 38.2 05/09/2017   PLT 244.0 05/09/2017   GLUCOSE 148 (H) 05/09/2017   CHOL 168 05/09/2017   TRIG 129.0 05/09/2017   HDL 37.20 (L) 05/09/2017   LDLCALC 105 (H) 05/09/2017   ALT 27 05/09/2017   AST 22 05/09/2017   NA 143 05/09/2017   K 3.6 05/09/2017   CL 101 05/09/2017   CREATININE 1.05 05/09/2017   BUN 25 (H) 05/09/2017   CO2 33 (H) 05/09/2017   TSH 2.92 05/09/2017   INR 1.12 11/19/2009   HGBA1C 6.7 08/30/2017   MICROALBUR 31.2 (H) 05/09/2017   BP Readings from Last 3 Encounters:  02/28/18 (!) 160/70  08/30/17 134/80  03/15/17 (!) 148/78    ASSESSMENT AND PLAN:  Discussed the following assessment and plan:  Cough, persistent - Plan: DG Chest 2 View, Basic metabolic panel, CBC with Differential/Platelet, Hemoglobin A1c, Hepatic function panel, Lipid panel, TSH, Microalbumin / creatinine urine ratio  Essential hypertension - Plan: Basic metabolic panel, CBC with Differential/Platelet, Hemoglobin A1c, Hepatic  function panel, Lipid panel, TSH, Microalbumin / creatinine urine ratio  Diabetes mellitus without complication (HCC) - Plan: Basic metabolic panel, CBC with Differential/Platelet, Hemoglobin A1c, Hepatic function panel, Lipid panel, TSH, Microalbumin / creatinine urine ratio  Medication management - Plan: Basic metabolic panel, CBC with Differential/Platelet, Hemoglobin A1c,  Hepatic function panel, Lipid panel, TSH, Microalbumin / creatinine urine ratio  Insomnia, unspecified type  Hyperlipidemia, unspecified hyperlipidemia type - Plan: Basic metabolic panel, CBC with Differential/Platelet, Hemoglobin A1c, Hepatic function panel, Lipid panel, TSH, Microalbumin / creatinine urine ratio Blood pressure has whitecoat effect however up continue today.  Increase to Diovan 160/25 HCTZ plan our OV in about 4 months plan fasting labs in the next few weeks Continued attention to diet and exercise may have to augment metabolic treatment depending on results. Although cough is probably reflux with increasing Protonix to twice a day is reasonable get Cx-ray at her convenience. No sx of complication of dm -Patient advised to return or notify health care team  if  new concerns arise.  Patient Instructions  Ok to try the protonix for twice a day  And then decide  If helps the throat clearing and cough .    Get the chest x ray when possibel at the [facility]  X ray.   Change BP formulation.   Get fasting lab appt    Continue lifestyle intervention healthy eating and exercise .  Plan  ROV in   4 months.      Neta Mends.  M.D.

## 2018-03-07 DIAGNOSIS — M50322 Other cervical disc degeneration at C5-C6 level: Secondary | ICD-10-CM | POA: Diagnosis not present

## 2018-03-07 DIAGNOSIS — M5136 Other intervertebral disc degeneration, lumbar region: Secondary | ICD-10-CM | POA: Diagnosis not present

## 2018-03-07 DIAGNOSIS — M9903 Segmental and somatic dysfunction of lumbar region: Secondary | ICD-10-CM | POA: Diagnosis not present

## 2018-03-07 DIAGNOSIS — M9901 Segmental and somatic dysfunction of cervical region: Secondary | ICD-10-CM | POA: Diagnosis not present

## 2018-03-19 ENCOUNTER — Other Ambulatory Visit: Payer: Self-pay | Admitting: Internal Medicine

## 2018-03-19 ENCOUNTER — Other Ambulatory Visit (INDEPENDENT_AMBULATORY_CARE_PROVIDER_SITE_OTHER): Payer: Medicare Other

## 2018-03-19 ENCOUNTER — Ambulatory Visit (INDEPENDENT_AMBULATORY_CARE_PROVIDER_SITE_OTHER): Payer: Medicare Other

## 2018-03-19 DIAGNOSIS — R05 Cough: Secondary | ICD-10-CM

## 2018-03-19 DIAGNOSIS — Z0000 Encounter for general adult medical examination without abnormal findings: Secondary | ICD-10-CM

## 2018-03-19 DIAGNOSIS — I10 Essential (primary) hypertension: Secondary | ICD-10-CM | POA: Diagnosis not present

## 2018-03-19 DIAGNOSIS — E119 Type 2 diabetes mellitus without complications: Secondary | ICD-10-CM

## 2018-03-19 DIAGNOSIS — Z79899 Other long term (current) drug therapy: Secondary | ICD-10-CM | POA: Diagnosis not present

## 2018-03-19 DIAGNOSIS — E785 Hyperlipidemia, unspecified: Secondary | ICD-10-CM

## 2018-03-19 DIAGNOSIS — R053 Chronic cough: Secondary | ICD-10-CM

## 2018-03-19 LAB — CBC WITH DIFFERENTIAL/PLATELET
Basophils Absolute: 0.1 10*3/uL (ref 0.0–0.1)
Basophils Relative: 0.7 % (ref 0.0–3.0)
EOS PCT: 5.9 % — AB (ref 0.0–5.0)
Eosinophils Absolute: 0.5 10*3/uL (ref 0.0–0.7)
HCT: 38.2 % (ref 36.0–46.0)
Hemoglobin: 13 g/dL (ref 12.0–15.0)
Lymphocytes Relative: 29.4 % (ref 12.0–46.0)
Lymphs Abs: 2.4 10*3/uL (ref 0.7–4.0)
MCHC: 34 g/dL (ref 30.0–36.0)
MCV: 85.2 fl (ref 78.0–100.0)
MONO ABS: 0.5 10*3/uL (ref 0.1–1.0)
Monocytes Relative: 6.7 % (ref 3.0–12.0)
NEUTROS PCT: 57.3 % (ref 43.0–77.0)
Neutro Abs: 4.6 10*3/uL (ref 1.4–7.7)
Platelets: 282 10*3/uL (ref 150.0–400.0)
RBC: 4.49 Mil/uL (ref 3.87–5.11)
RDW: 14.7 % (ref 11.5–15.5)
WBC: 8.1 10*3/uL (ref 4.0–10.5)

## 2018-03-19 LAB — HEPATIC FUNCTION PANEL
ALK PHOS: 76 U/L (ref 39–117)
ALT: 28 U/L (ref 0–35)
AST: 20 U/L (ref 0–37)
Albumin: 4 g/dL (ref 3.5–5.2)
BILIRUBIN TOTAL: 0.3 mg/dL (ref 0.2–1.2)
Bilirubin, Direct: 0.1 mg/dL (ref 0.0–0.3)
Total Protein: 6.7 g/dL (ref 6.0–8.3)

## 2018-03-19 LAB — BASIC METABOLIC PANEL
BUN: 20 mg/dL (ref 6–23)
CALCIUM: 9.7 mg/dL (ref 8.4–10.5)
CO2: 32 mEq/L (ref 19–32)
Chloride: 102 mEq/L (ref 96–112)
Creatinine, Ser: 1.01 mg/dL (ref 0.40–1.20)
GFR: 56.93 mL/min — AB (ref >60.00)
GLUCOSE: 174 mg/dL — AB (ref 70–99)
Potassium: 4 mEq/L (ref 3.5–5.1)
SODIUM: 143 meq/L (ref 135–145)

## 2018-03-19 LAB — LIPID PANEL
CHOLESTEROL: 158 mg/dL (ref 0–200)
HDL: 41.4 mg/dL (ref >39.00)
LDL Cholesterol: 88 mg/dL (ref 0–99)
NonHDL: 116.58
Total CHOL/HDL Ratio: 4
Triglycerides: 145 mg/dL (ref 0.0–149.0)
VLDL: 29 mg/dL (ref 0.0–40.0)

## 2018-03-19 LAB — MICROALBUMIN / CREATININE URINE RATIO
CREATININE, U: 100.4 mg/dL
MICROALB UR: 30.9 mg/dL — AB (ref 0.0–1.9)
Microalb Creat Ratio: 30.8 mg/g — ABNORMAL HIGH (ref 0.0–30.0)

## 2018-03-19 LAB — HEMOGLOBIN A1C: Hgb A1c MFr Bld: 7 % — ABNORMAL HIGH (ref 4.6–6.5)

## 2018-03-19 LAB — TSH: TSH: 3.42 u[IU]/mL (ref 0.35–4.50)

## 2018-03-28 DIAGNOSIS — M5412 Radiculopathy, cervical region: Secondary | ICD-10-CM | POA: Diagnosis not present

## 2018-03-30 ENCOUNTER — Other Ambulatory Visit (HOSPITAL_COMMUNITY): Payer: Self-pay | Admitting: Neurological Surgery

## 2018-03-30 DIAGNOSIS — M5412 Radiculopathy, cervical region: Secondary | ICD-10-CM

## 2018-04-05 ENCOUNTER — Ambulatory Visit (HOSPITAL_COMMUNITY)
Admission: RE | Admit: 2018-04-05 | Discharge: 2018-04-05 | Disposition: A | Discharge status: Home / Self Care | Payer: Medicare Other | Source: Ambulatory Visit | Attending: Neurological Surgery | Admitting: Neurological Surgery

## 2018-04-05 DIAGNOSIS — M542 Cervicalgia: Secondary | ICD-10-CM | POA: Diagnosis not present

## 2018-04-05 DIAGNOSIS — M5412 Radiculopathy, cervical region: Secondary | ICD-10-CM

## 2018-04-05 DIAGNOSIS — M4722 Other spondylosis with radiculopathy, cervical region: Secondary | ICD-10-CM | POA: Diagnosis not present

## 2018-04-11 ENCOUNTER — Other Ambulatory Visit: Payer: Self-pay

## 2018-04-11 DIAGNOSIS — M47812 Spondylosis without myelopathy or radiculopathy, cervical region: Secondary | ICD-10-CM | POA: Diagnosis not present

## 2018-04-11 NOTE — Patient Outreach (Signed)
[facility] Care Management  04/11/2018  Kerry GrovesMary Sue Walters 1946-03-10 [id number]   Medication Adherence call to Mrs. Kerry PancakeMary Walters left a message for patient to call back patient is due on Atorvastatin 20 mg. Mrs. Kerry Walters is showing past due under [facility].  Kerry Walters CPhT Pharmacy Technician [facility] Care Management Direct Dial [phone]  Fax [phone] [email or web address]

## 2018-04-23 DIAGNOSIS — M9904 Segmental and somatic dysfunction of sacral region: Secondary | ICD-10-CM | POA: Diagnosis not present

## 2018-04-23 DIAGNOSIS — M9903 Segmental and somatic dysfunction of lumbar region: Secondary | ICD-10-CM | POA: Diagnosis not present

## 2018-04-23 DIAGNOSIS — M5136 Other intervertebral disc degeneration, lumbar region: Secondary | ICD-10-CM | POA: Diagnosis not present

## 2018-04-23 DIAGNOSIS — M9905 Segmental and somatic dysfunction of pelvic region: Secondary | ICD-10-CM | POA: Diagnosis not present

## 2018-05-02 DIAGNOSIS — M9903 Segmental and somatic dysfunction of lumbar region: Secondary | ICD-10-CM | POA: Diagnosis not present

## 2018-05-02 DIAGNOSIS — M9905 Segmental and somatic dysfunction of pelvic region: Secondary | ICD-10-CM | POA: Diagnosis not present

## 2018-05-02 DIAGNOSIS — M9904 Segmental and somatic dysfunction of sacral region: Secondary | ICD-10-CM | POA: Diagnosis not present

## 2018-05-02 DIAGNOSIS — M5104 Intervertebral disc disorders with myelopathy, thoracic region: Secondary | ICD-10-CM | POA: Diagnosis not present

## 2018-05-10 ENCOUNTER — Other Ambulatory Visit: Payer: Self-pay

## 2018-05-10 NOTE — Patient Outreach (Signed)
[facility] Care Management  05/10/2018  Lucious GrovesMary Sue Carbonneau 1945-09-20 [id number]   Medication Adherence call to Mrs. Rolly PancakeMary Lein spoke with patient she explain she is only taking Atorvastatin 20 mg on Monday,Wednesday, and Friday not every days ,patient has medication and doctor is aware and her  cholesterol is doing  better taking it this way. Mrs. Leone BrandWaite is showing past due under [facility].   Lillia Abed Ollison-Moran CPhT Pharmacy Technician [facility] Care Management Direct Dial [phone]  Fax [phone] [email or web address]

## 2018-05-28 DIAGNOSIS — M5412 Radiculopathy, cervical region: Secondary | ICD-10-CM | POA: Diagnosis not present

## 2018-06-05 DIAGNOSIS — Z1231 Encounter for screening mammogram for malignant neoplasm of breast: Secondary | ICD-10-CM | POA: Diagnosis not present

## 2018-06-05 LAB — HM MAMMOGRAPHY

## 2018-06-07 DIAGNOSIS — M5412 Radiculopathy, cervical region: Secondary | ICD-10-CM | POA: Diagnosis not present

## 2018-06-11 DIAGNOSIS — M9905 Segmental and somatic dysfunction of pelvic region: Secondary | ICD-10-CM | POA: Diagnosis not present

## 2018-06-11 DIAGNOSIS — M9903 Segmental and somatic dysfunction of lumbar region: Secondary | ICD-10-CM | POA: Diagnosis not present

## 2018-06-11 DIAGNOSIS — M5104 Intervertebral disc disorders with myelopathy, thoracic region: Secondary | ICD-10-CM | POA: Diagnosis not present

## 2018-06-11 DIAGNOSIS — M9904 Segmental and somatic dysfunction of sacral region: Secondary | ICD-10-CM | POA: Diagnosis not present

## 2018-06-13 ENCOUNTER — Encounter: Payer: Self-pay | Admitting: Internal Medicine

## 2018-06-25 ENCOUNTER — Other Ambulatory Visit: Payer: Self-pay

## 2018-06-25 ENCOUNTER — Ambulatory Visit (HOSPITAL_COMMUNITY): Payer: Medicare Other | Attending: Anesthesiology | Admitting: Physical Therapy

## 2018-06-25 DIAGNOSIS — M5412 Radiculopathy, cervical region: Secondary | ICD-10-CM | POA: Diagnosis not present

## 2018-06-25 DIAGNOSIS — R293 Abnormal posture: Secondary | ICD-10-CM

## 2018-06-25 NOTE — Therapy (Signed)
[facility] [address] [place], [place], [address] Phone: [phone]   Fax:  [phone]  Physical Therapy Evaluation  Patient Details  Name: Kerry Walters MRN: [id number] Date of Birth: Mar 28, 1946 Referring Provider (PT): Odette Fraction   Encounter Date: 06/25/2018  PT End of Session - 06/25/18 1403    Visit Number  1    Number of Visits  12    Date for PT Re-Evaluation  07/25/18    Authorization Type  [employer] MEdicare     PT Start Time  1120    PT Stop Time  1204    PT Time Calculation (min)  44 min    Activity Tolerance  Patient tolerated treatment well    Behavior During Therapy  [facility] for tasks assessed/performed       Past Medical History:  Diagnosis Date  . Asthma due to environmental allergies    no inhaler  . Diverticulosis of colon   . GERD (gastroesophageal reflux disease)   . History of esophageal dilatation    FOR STRICTURE IN 2005  . History of hiatal hernia   . History of hypercalcemia    SECONDARY TO HYPERPARATHYROID  . History of hyperparathyroidism    PRIMARY--  S/P RIGHT PARATHYROIDECTOMY  . History of kidney stones   . History of recurrent UTIs   . Hyperlipidemia   . Hypertension   . Hypothyroidism   . Incomplete right bundle branch block   . Nephrolithiasis    RIGHT   . Nocturia   . PONV (postoperative nausea and vomiting)   . Pre-diabetes   . Sciatica of right side     Past Surgical History:  Procedure Laterality Date  . APPENDECTOMY  1974  . CYSTOSCOPY WITH URETEROSCOPY AND STENT PLACEMENT Right 11/11/2014   Procedure: CYSTOSCOPY WITH URETEROSCOPY AND STENT PLACEMENT;  Surgeon: Crist Fat, MD;  Location: [facility];  Service: Urology;  Laterality: Right;  . EXCISION URETHRAL CARUNCLE  1981   Open procedure  . KNEE ARTHROSCOPY Left april 2010  . REMOVAL RIP  1973   First Rib on Right side-- for impingement  . RIGHT INFERIOR PARATHYROIDECTOMY, MINIMALLY INVASIVE   08-14-2008  . TONSILLECTOMY  as child  . TOTAL KNEE ARTHROPLASTY Left 11-17-2009  . TUBAL LIGATION  1973  . VAGINAL HYSTERECTOMY  1995   w/ Bilateral Salpingoophorectomy    There were no vitals filed for this visit.   Subjective Assessment - 06/25/18 1134    Subjective  Ms. Wandler states that she has had cervical pain for years but around mid February her pain increased.  She was seeing her chiropractor about 2x a week but the pain was not doing better therefore she went to the MD>  She has now been referred to PT.  She states that when she uses her arm or turns her head her pain increases.  The pain will radiate to the elbow.     Pertinent History  TKR,     Limitations  House hold activities;Sitting;Lifting    How long can you sit comfortably?  able to sit for varied time but no longer than an hour     Patient Stated Goals  PT is waking up 3-4 x a night in pain and having difficulty sleeping     Currently in Pain?  Yes    Pain Score  3     Pain Location  Neck    Pain Orientation  Mid;Lower    Pain  Type  Chronic pain    Pain Radiating Towards  Lt arm to elbow     Pain Onset  More than a month ago    Pain Frequency  Constant    Aggravating Factors   moving her head     Pain Relieving Factors  raising her Lt arm and shake her hand     Effect of Pain on Daily Activities  limits         [facility] PT Assessment - 06/25/18 0001      Assessment   Medical Diagnosis  cervical radiculopathy LT     Referring Provider (PT)  Odette Fraction    Onset Date/Surgical Date  10/13/17    Prior Therapy  chiropractor      Precautions   Precautions  None      Restrictions   Weight Bearing Restrictions  No      Prior Function   Level of Independence  Independent    Vocation  Retired    Leisure  yard work       IT consultant   Overall Cognitive Status  Within Functional Limits for tasks assessed      Observation/Other Assessments   Focus on Therapeutic Outcomes (FOTO)   48      Posture/Postural  Control   Posture/Postural Control  Postural limitations    Postural Limitations  Rounded Shoulders;Forward head;Increased thoracic kyphosis      ROM / Strength   AROM / PROM / Strength  AROM;Strength      AROM   AROM Assessment Site  Cervical    Cervical Extension  35    Cervical - Right Side Bend  20    Cervical - Left Side Bend  12    Cervical - Right Rotation  35    Cervical - Left Rotation  25      Strength   Strength Assessment Site  Cervical    Cervical Extension  3+/5    Cervical - Right Side Bend  3+/5    Cervical - Left Side Bend  3+/5      Palpation   Palpation comment  moderate to marked mm spasm                Objective measurements completed on examination: See above findings.      [facility] Adult PT Treatment/Exercise - 06/25/18 0001      Self-Care   Self-Care  Posture      Exercises   Exercises  Neck      Neck Exercises: Seated   Neck Retraction  10 reps    Cervical Retraction   scapular retraction x 10   Lateral Flexion  Left;5 reps    Lateral Flexion Limitations Sitting tall x 10   cervical extension x 10              PT Education - 06/25/18 1402    Education Details  HEP    Person(s) Educated  Patient    Methods  Explanation;Handout    Comprehension  Verbalized understanding;Returned demonstration       PT Short Term Goals - 06/25/18 1414      PT SHORT TERM GOAL #1   Title  PT rotation to improve to 50 degrees B to improve safety while driving.     Time  2    Period  Weeks    Status  New    Target Date  07/09/18      PT SHORT TERM GOAL #2   Title  PT pain to be radiating no further than her shoulder area to demonstrate decreased nerve root irritation.     Time  2    Period  Weeks    Status  New      PT SHORT TERM GOAL #3   Title  Pt to be able to use LT hand for light activity like holding dishes while drying, folding clothes without increased pain.     Time  2    Period  Weeks    Status  New        PT Long  Term Goals - 06/25/18 1417      PT LONG TERM GOAL #1   Title  PT cervical rotation to be 60 degrees B for safer driving.     Time  4    Period  Weeks    Status  New    Target Date  07/23/18      PT LONG TERM GOAL #2   Title  PT pain level in her neck and Lt arm to be no greater than a 4/10 to allow pt to return to yard work activity.     Time  4    Period  Weeks    Status  New      PT LONG TERM GOAL #3   Title  Pt to no longer be having radicular sx to demonstrate decreased nerve root irritation.     Time  4    Period  Weeks    Status  New             Plan - 06/25/18 1403    Clinical Impression Statement  Ms. Centner is a 72 yo female who has had chronic cervical pain for which she goes to a chiropractor on a regular basis.  She has been experiencing increased pain since February of this year including pain going down her left arm to the elbow level.  She states sleeping on her Lt side helps while sleeping on her right side causes radicular sx.  She went to the neurologist after not getting any relief from the chiropractor and is now being referred to skilled therapy.  Examination demonstrates decreased ROM, decreased strength, postural dysfunction, increased pain and increased activity tolerance.  Ms. Herter will benefit from skilled physical therapy to address these issues and maximize her current functioning tolerance.       History and Personal Factors relevant to plan of care:  multiple level of degeneration per MRI please see report.  OA, HTN     Clinical Presentation  Evolving    Rehab Potential  Good    PT Frequency  3x / week    PT Duration  4 weeks    PT Treatment/Interventions  ADLs/Self Care Home Management;Therapeutic activities;Therapeutic exercise;Patient/family education;Manual techniques;Passive range of motion;Joint Manipulations    PT Next Visit Plan  begin manual including traction.  Review exercises and goals.  Add w-back, supine cervical retraction, cervical  isometric staying painfree.     PT Home Exercise Plan  HEP:  cervical and scapular retraction,  sitting tall, Lt SB, and extension.     Consulted and Agree with Plan of Care  Patient       Patient will benefit from skilled therapeutic intervention in order to improve the following deficits and impairments:  Decreased activity tolerance, Decreased range of motion, Decreased strength, Hypomobility, Increased fascial restricitons, Increased muscle spasms, Impaired UE functional use, Obesity, Pain  Visit Diagnosis: Radiculopathy, cervical region - Plan: PT  plan of care cert/re-cert  Abnormal posture - Plan: PT plan of care cert/re-cert     Problem List Patient Active Problem List   Diagnosis Date Noted  . Personal history of kidney stones 02/23/2015  . Atherosclerosis 02/23/2015  . Hx of low back pain 05/06/2014  . Encounter for preventive health examination 05/06/2014  . BMI 32.0-32.9,adult 11/05/2013  . Throat clearing 05/03/2013  . Nocturnal leg cramps 10/31/2012  . Low back pain 12/27/2011  . Pre-diabetes 09/27/2011  . Menopausal hot flushes 04/12/2011  . Knee pain 11/22/2010  . OBESITY 12/23/2008  . NEPHROLITHIASIS, HX OF 03/13/2008  . Hypothyroidism 06/29/2007  . HYPERLIPIDEMIA 06/29/2007  . Essential hypertension 06/29/2007  . ALLERGIC RHINITIS 06/29/2007  . GERD 06/29/2007  . POSTMENOPAUSAL STATUS 06/29/2007  . LEG PAIN, CHRONIC 06/29/2007  . HYPERGLYCEMIA, FASTING 06/29/2007   Virgina Organ, PT CLT [phone] 06/25/2018, 2:25 PM  [facility] [address] [place], [place], [address] Phone: [phone]   Fax:  [phone]  Name: Deandra Goering MRN: [id number] Date of Birth: Dec 14, 1945

## 2018-06-25 NOTE — Patient Instructions (Addendum)
Scapular Retraction (Standing)   Sitting  With arms at sides, pinch shoulder blades together. Repeat 10____ times per set. Do _1___ sets per session. Do _2___ sessions per day.  http://orth.exer.us/944   Copyright  VHI. All rights reserved.  Flexibility: Neck Retraction    Pull head straight back, keeping eyes and jaw level. Repeat __10__ times per set. Do __1__ sets per session. Do _2___ sessions per day.  http://orth.exer.us/344   Copyright  VHI. All rights reserved.  AROM: Neck Extension    Bend head backward. Hold ___2_ seconds. Repeat __10__ times per set. Do __1__ sets per session. Do __2__ sessions per day.  http://orth.exer.us/300   Copyright  VHI. All rights reserved.  AROM: Lateral Neck Flexion    Slowly tilt head toward left shoulder, . Hold each position _2___ seconds. Repeat __5-10__ times per set. Do __1__ sets per session. Do __2__ sessions per day.  http://orth.exer.us/296   Copyright  VHI. All rights reserved.

## 2018-06-28 ENCOUNTER — Ambulatory Visit (HOSPITAL_COMMUNITY): Payer: Medicare Other | Admitting: Physical Therapy

## 2018-06-28 DIAGNOSIS — M5412 Radiculopathy, cervical region: Secondary | ICD-10-CM

## 2018-06-28 DIAGNOSIS — R293 Abnormal posture: Secondary | ICD-10-CM

## 2018-06-28 NOTE — Patient Instructions (Addendum)
Strengthening: Lateral Bend - Isometric (in Neutral)    Using light pressure from fingertips, press into right temple. Resist bending head sideways. Hold __3__ seconds.  Repeat to the left  Repeat __10__ times per set. Do _1___ sets per session. Do _2___ sessions per day.  http://orth.exer.us/302   Copyright  VHI. All rights reserved.  Strengthening: Extension - Isometric (in Neutral)    Using light pressure from fingertips at back of head, resist bending head backward. Hold __3__ seconds. Repeat __10__ times per set. Do ___1_ sets per session. Do __2__ sessions per day.  http://orth.exer.us/308   Copyright  VHI. All rights reserved.  Scapular Retraction: Abduction (Standing)    With arms elevated and elbows bent to 90, pinch shoulder blades together and press arms back. Repeat _10___ times per set. Do __1__ sets per session. Do _2___ sessions per day.  http://orth.exer.us/950   Copyright  VHI. All rights reserved.

## 2018-06-28 NOTE — Therapy (Signed)
[facility] [address] [place], [place], [address] Phone: [phone]   Fax:  [phone]  Physical Therapy Treatment  Patient Details  Name: Kerry Walters MRN: [id number] Date of Birth: May 11, 1946 Referring Provider (PT): Odette Fraction   Encounter Date: 06/28/2018  PT End of Session - 06/28/18 1128    Visit Number  2    Number of Visits  12    Date for PT Re-Evaluation  07/25/18    Authorization Type  [employer] M[employer]     PT Start Time  1030    PT Stop Time  1115    PT Time Calculation (min)  45 min    Activity Tolerance  Patient tolerated treatment well    Behavior During Therapy  [facility] for tasks assessed/performed       Past Medical History:  Diagnosis Date  . Asthma due to environmental allergies    no inhaler  . Diverticulosis of colon   . GERD (gastroesophageal reflux disease)   . History of esophageal dilatation    FOR STRICTURE IN 2005  . History of hiatal hernia   . History of hypercalcemia    SECONDARY TO HYPERPARATHYROID  . History of hyperparathyroidism    PRIMARY--  S/P RIGHT PARATHYROIDECTOMY  . History of kidney stones   . History of recurrent UTIs   . Hyperlipidemia   . Hypertension   . Hypothyroidism   . Incomplete right bundle branch block   . Nephrolithiasis    RIGHT   . Nocturia   . PONV (postoperative nausea and vomiting)   . Pre-diabetes   . Sciatica of right side     Past Surgical History:  Procedure Laterality Date  . APPENDECTOMY  1974  . CYSTOSCOPY WITH URETEROSCOPY AND STENT PLACEMENT Right 11/11/2014   Procedure: CYSTOSCOPY WITH URETEROSCOPY AND STENT PLACEMENT;  Surgeon: Crist Fat, MD;  Location: [facility];  Service: Urology;  Laterality: Right;  . EXCISION URETHRAL CARUNCLE  1981   Open procedure  . KNEE ARTHROSCOPY Left april 2010  . REMOVAL RIP  1973   First Rib on Right side-- for impingement  . RIGHT INFERIOR PARATHYROIDECTOMY, MINIMALLY INVASIVE   08-14-2008  . TONSILLECTOMY  as child  . TOTAL KNEE ARTHROPLASTY Left 11-17-2009  . TUBAL LIGATION  1973  . VAGINAL HYSTERECTOMY  1995   w/ Bilateral Salpingoophorectomy    There were no vitals filed for this visit.  Subjective Assessment - 06/28/18 1033    Subjective  PT states that even her right shoulder is hurting today; she thinks that it might be the weather.   She is attempting to read at eye height.     Pertinent History  TKR,     Limitations  House hold activities;Sitting;Lifting    How long can you sit comfortably?  able to sit for varied time but no longer than an hour     Patient Stated Goals  PT is waking up 3-4 x a night in pain and having difficulty sleeping     Currently in Pain?  Yes    Pain Score  5     Pain Location  Neck    Pain Orientation  Left;Right    Pain Descriptors / Indicators  Aching    Pain Radiating Towards  B elbow the right goes away but the Lt never leaves her.      Pain Onset  More than a month ago    Pain Frequency  Constant  Aggravating Factors   weather , reading, computer    Pain Relieving Factors  meds     Effect of Pain on Daily Activities  limits                        [facility] Adult PT Treatment/Exercise - 06/28/18 0001      Exercises   Exercises  Neck      Neck Exercises: Seated   Cervical Isometrics  Extension;Right lateral flexion;Left lateral flexion;3 secs;5 reps    Neck Retraction  10 reps    Neck Retraction Limitations  scapular retraction    Lateral Flexion  Left;5 reps    W Back  10 reps    Shoulder Rolls  Backwards;10 reps      Neck Exercises: Supine   Neck Retraction  10 reps    Shoulder Flexion Limitations  elbow flexion only trying to lift UE causes radicular sx x 10     Other Supine Exercise  scapular retraction x 10     Other Supine Exercise  elbows at 90 degrees shoulder extension isometrics B x 10 with cervical stab; Protraction retraction x 10       Manual Therapy   Manual Therapy  Soft tissue  mobilization;Manual Traction    Manual therapy comments  done seperate from all other aspects of treatment.     Soft tissue mobilization  LT trap to trigger points.      Manual Traction  5x30"               PT Short Term Goals - 06/28/18 1109      PT SHORT TERM GOAL #1   Title  PT rotation to improve to 50 degrees B to improve safety while driving.     Time  2    Period  Weeks    Status  On-going      PT SHORT TERM GOAL #2   Title  PT pain to be radiating no further than her shoulder area to demonstrate decreased nerve root irritation.     Time  2    Period  Weeks    Status  On-going      PT SHORT TERM GOAL #3   Title  Pt to be able to use LT hand for light activity like holding dishes while drying, folding clothes without increased pain.     Time  2    Period  Weeks    Status  On-going        PT Long Term Goals - 06/28/18 1110      PT LONG TERM GOAL #1   Title  PT cervical rotation to be 60 degrees B for safer driving.     Time  4    Period  Weeks    Status  On-going      PT LONG TERM GOAL #2   Title  PT pain level in her neck and Lt arm to be no greater than a 4/10 to allow pt to return to yard work activity.     Time  4    Period  Weeks    Status  On-going      PT LONG TERM GOAL #3   Title  Pt to no longer be having radicular sx to demonstrate decreased nerve root irritation.     Time  4    Period  Weeks    Status  On-going  Plan - 06/28/18 1129    Clinical Impression Statement  Reviewed goals with patient.  PT has significant radicular sx with UE motion began supine stability exercises making sure pt was able to stabilize spine without experiencing radicular sx with all motions.  PT was unable to complete any shoulder flexion even with elbows flexed to 90 degrees.     History and Personal Factors relevant to plan of care:  multiple level of degeneration per MRI please see report. OA, HTN     Rehab Potential  Good    PT Frequency  3x /  week    PT Duration  4 weeks    PT Treatment/Interventions  ADLs/Self Care Home Management;Therapeutic activities;Therapeutic exercise;Patient/family education;Manual techniques;Passive range of motion;Joint Manipulations    PT Next Visit Plan  continue with manual and cervical stability exercises.  Will need to stay with supine stability exercises until pt can complete B UE flexion and abduction without radicular sx.     PT Home Exercise Plan  HEP:  cervical and scapular retraction,  sitting tall, Lt SB, and extension.     Consulted and Agree with Plan of Care  Patient       Patient will benefit from skilled therapeutic intervention in order to improve the following deficits and impairments:  Decreased activity tolerance, Decreased range of motion, Decreased strength, Hypomobility, Increased fascial restricitons, Increased muscle spasms, Impaired UE functional use, Obesity, Pain  Visit Diagnosis: Radiculopathy, cervical region  Abnormal posture     Problem List Patient Active Problem List   Diagnosis Date Noted  . Personal history of kidney stones 02/23/2015  . Atherosclerosis 02/23/2015  . Hx of low back pain 05/06/2014  . Encounter for preventive health examination 05/06/2014  . BMI 32.0-32.9,adult 11/05/2013  . Throat clearing 05/03/2013  . Nocturnal leg cramps 10/31/2012  . Low back pain 12/27/2011  . Pre-diabetes 09/27/2011  . Menopausal hot flushes 04/12/2011  . Knee pain 11/22/2010  . OBESITY 12/23/2008  . NEPHROLITHIASIS, HX OF 03/13/2008  . Hypothyroidism 06/29/2007  . HYPERLIPIDEMIA 06/29/2007  . Essential hypertension 06/29/2007  . ALLERGIC RHINITIS 06/29/2007  . GERD 06/29/2007  . POSTMENOPAUSAL STATUS 06/29/2007  . LEG PAIN, CHRONIC 06/29/2007  . HYPERGLYCEMIA, FASTING 06/29/2007   Virgina Organ, PT CLT [phone] 06/28/2018, 11:34 AM  [facility] [address] [place], [place], [address] Phone: [phone]    Fax:  [phone]  Name: Kassey Laforest MRN: [id number] Date of Birth: 1946/04/30

## 2018-07-02 ENCOUNTER — Ambulatory Visit: Payer: Medicare Other | Admitting: Internal Medicine

## 2018-07-02 ENCOUNTER — Encounter: Payer: Self-pay | Admitting: Internal Medicine

## 2018-07-02 VITALS — BP 142/86 | HR 75 | Temp 97.8°F | Wt 163.2 lb

## 2018-07-02 DIAGNOSIS — E119 Type 2 diabetes mellitus without complications: Secondary | ICD-10-CM | POA: Diagnosis not present

## 2018-07-02 DIAGNOSIS — M489 Spondylopathy, unspecified: Secondary | ICD-10-CM | POA: Diagnosis not present

## 2018-07-02 DIAGNOSIS — I10 Essential (primary) hypertension: Secondary | ICD-10-CM | POA: Diagnosis not present

## 2018-07-02 DIAGNOSIS — Z79899 Other long term (current) drug therapy: Secondary | ICD-10-CM

## 2018-07-02 DIAGNOSIS — Z23 Encounter for immunization: Secondary | ICD-10-CM

## 2018-07-02 DIAGNOSIS — R053 Chronic cough: Secondary | ICD-10-CM

## 2018-07-02 DIAGNOSIS — R05 Cough: Secondary | ICD-10-CM

## 2018-07-02 LAB — POCT GLYCOSYLATED HEMOGLOBIN (HGB A1C): HEMOGLOBIN A1C: 6.4 % — AB (ref 4.0–5.6)

## 2018-07-02 NOTE — Progress Notes (Signed)
Chief Complaint  Patient presents with  . Cough    Pt states that her cough is not yet resolved but is improved. Still taking Protonix BID    HPI: Kerry Walters 72 y.o. come in for Chronic disease management   Cough  Some better on protonix  Bid   Reflux  BG not checking  And bp not checking but bp med changes and taling regularly .   Battling pain  And getting pt for her djd of c spine  Trying to avoid surgery . Had a injection helped for one day to get another  .( dr Danielle Dess is  Provider )  ROS: See pertinent positives and negatives per HPI. nocv syncope gooing on a bus trip to [place] sight seeing etc.   Past Medical History:  Diagnosis Date  . Asthma due to environmental allergies    no inhaler  . Diverticulosis of colon   . GERD (gastroesophageal reflux disease)   . History of esophageal dilatation    FOR STRICTURE IN 2005  . History of hiatal hernia   . History of hypercalcemia    SECONDARY TO HYPERPARATHYROID  . History of hyperparathyroidism    PRIMARY--  S/P RIGHT PARATHYROIDECTOMY  . History of kidney stones   . History of recurrent UTIs   . Hyperlipidemia   . Hypertension   . Hypothyroidism   . Incomplete right bundle branch block   . Nephrolithiasis    RIGHT   . Nocturia   . PONV (postoperative nausea and vomiting)   . Pre-diabetes   . Sciatica of right side     Family History  Problem Relation Age of Onset  . Cancer Son        liver  . Cancer Father        brain  . Diabetes Sister   . Schizophrenia Son     Social History   Socioeconomic History  . Marital status: Married    Spouse name: Not on file  . Number of children: Not on file  . Years of education: Not on file  . Highest education level: Not on file  Occupational History  . Not on file  Social Needs  . Financial resource strain: Not on file  . Food insecurity:    Worry: Not on file    Inability: Not on file  . Transportation needs:    Medical: Not on file    Non-medical:  Not on file  Tobacco Use  . Smoking status: Never Smoker  . Smokeless tobacco: Never Used  Substance and Sexual Activity  . Alcohol use: No  . Drug use: No  . Sexual activity: Not on file  Lifestyle  . Physical activity:    Days per week: Not on file    Minutes per session: Not on file  . Stress: Not on file  Relationships  . Social connections:    Talks on phone: Not on file    Gets together: Not on file    Attends religious service: Not on file    Active member of club or organization: Not on file    Attends meetings of clubs or organizations: Not on file    Relationship status: Not on file  Other Topics Concern  . Not on file  Social History Narrative   Married   hh of 2   4 dogs   G2 P2   Water exercise.   Retired    [employer]    great grandchild  Outpatient Medications Prior to Visit  Medication Sig Dispense Refill  . atorvastatin (LIPITOR) 20 MG tablet Take 1 tablet (20 mg total) by mouth every evening. 90 tablet 3  . conjugated estrogens (PREMARIN) vaginal cream Place 1 Applicatorful vaginally as directed. Use 2-3 times weekly  prn 42.5 g 1  . gabapentin (NEURONTIN) 100 MG capsule Take 1-3 capsules (100-300 mg total) by mouth at bedtime. asdirected 270 capsule 3  . levothyroxine (SYNTHROID, LEVOTHROID) 75 MCG tablet Take 1 tablet (75 mcg total) by mouth daily. 90 tablet 3  . metFORMIN (GLUCOPHAGE-XR) 500 MG 24 hr tablet TAKE 3 TABLETS BY MOUTH A  DAY 270 tablet 3  . pantoprazole (PROTONIX) 40 MG tablet Take 1 tablet (40 mg total) by mouth 2 (two) times daily. 180 tablet 2  . valsartan-hydrochlorothiazide (DIOVAN-HCT) 160-25 MG tablet Take 1 tablet by mouth daily. 90 tablet 3  . traMADol (ULTRAM) 50 MG tablet Take 50 mg by mouth every 6 (six) hours as needed.  0   Facility-Administered Medications Prior to Visit  Medication Dose Route Frequency Provider Last Rate Last Dose  . betamethasone acetate-betamethasone sodium phosphate (CELESTONE) injection 3 mg   3 mg Intramuscular Once Gala Lewandowsky M, DPM      . betamethasone acetate-betamethasone sodium phosphate (CELESTONE) injection 3 mg  3 mg Intramuscular Once Evans, Brent M, DPM         EXAM:  BP (!) 142/86 (BP Location: Right Arm, Patient Position: Sitting, Cuff Size: Normal)   Pulse 75   Temp 97.8 F (36.6 C) (Oral)   Wt 163 lb 3.2 oz (74 kg)   BMI 30.22 kg/m   Body mass index is 30.22 kg/m.  GENERAL: vitals reviewed and listed above, alert, oriented, appears well hydrated and in no acute distress HEENT: atraumatic, conjunctiva  clear, no obvious abnormalities on inspection of external nose and ears NECK: no obvious masses on inspection palpation  LUNGS: clear to auscultation bilaterally, no wheezes, rales or rhonchi, good air movement CV: HRRR, no clubbing cyanosis or  peripheral edema nl cap refill  MS: moves all extremities  Ambulatory neck held stiffy  Feet  No acute findings nl perfusion PSYCH: pleasant and cooperative, no obvious depression or anxiety Lab Results  Component Value Date   WBC 8.1 03/19/2018   HGB 13.0 03/19/2018   HCT 38.2 03/19/2018   PLT 282.0 03/19/2018   GLUCOSE 174 (H) 03/19/2018   CHOL 158 03/19/2018   TRIG 145.0 03/19/2018   HDL 41.40 03/19/2018   LDLCALC 88 03/19/2018   ALT 28 03/19/2018   AST 20 03/19/2018   NA 143 03/19/2018   K 4.0 03/19/2018   CL 102 03/19/2018   CREATININE 1.01 03/19/2018   BUN 20 03/19/2018   CO2 32 03/19/2018   TSH 3.42 03/19/2018   INR 1.12 11/19/2009   HGBA1C 6.4 (A) 07/02/2018   MICROALBUR 30.9 (H) 03/19/2018   BP Readings from Last 3 Encounters:  07/02/18 (!) 142/86  02/28/18 (!) 160/70  08/30/17 134/80   Wt Readings from Last 3 Encounters:  07/02/18 163 lb 3.2 oz (74 kg)  02/28/18 170 lb (77.1 kg)  08/30/17 171 lb 1.6 oz (77.6 kg)    ASSESSMENT AND PLAN:  Discussed the following assessment and plan:  Diabetes mellitus without complication (HCC) - Plan: POC HgB A1c  Essential  hypertension  Medication management - Plan: POC HgB A1c  Need for influenza vaccination - Plan: Flu vaccine HIGH DOSE PF (Fluzone High dose)  Cervical spine disease  Cough,  persistent - improve with med change and ppi bid Weight loss and  a1c much better  PV   Visit in 4-5 mos Bad back  Neck and doing PT. Doing epidurals  .   -Patient advised to return or notify health care team  if  new concerns arise.  Patient Instructions  Have a good trip Continue medications    Continue lifestyle intervention healthy eating  avoiding sugars etc   .   Neta Mends.  M.D.

## 2018-07-02 NOTE — Patient Instructions (Signed)
Have a good trip Continue medications    Continue lifestyle intervention healthy eating  avoiding sugars etc   .

## 2018-07-03 ENCOUNTER — Ambulatory Visit (HOSPITAL_COMMUNITY): Payer: Medicare Other | Attending: Anesthesiology | Admitting: Physical Therapy

## 2018-07-03 ENCOUNTER — Encounter (HOSPITAL_COMMUNITY): Payer: Self-pay | Admitting: Physical Therapy

## 2018-07-03 DIAGNOSIS — R293 Abnormal posture: Secondary | ICD-10-CM | POA: Insufficient documentation

## 2018-07-03 DIAGNOSIS — M5412 Radiculopathy, cervical region: Secondary | ICD-10-CM | POA: Insufficient documentation

## 2018-07-03 NOTE — Therapy (Signed)
[facility] [address] [place], [place], [address] Phone: [phone]   Fax:  [phone]  Physical Therapy Treatment  Patient Details  Name: Kerry Walters MRN: [id number] Date of Birth: 1946-04-29 Referring Provider (PT): Odette Fraction   Encounter Date: 07/03/2018  PT End of Session - 07/03/18 1511    Visit Number  3    Number of Visits  12    Date for PT Re-Evaluation  07/25/18    Authorization Type  [employer] MEdic[employer]     PT Start Time  1410    PT Stop Time  1500    PT Time Calculation (min)  50 min    Activity Tolerance  Patient tolerated treatment well    Behavior During Therapy  [facility] for tasks assessed/performed       Past Medical History:  Diagnosis Date  . Asthma due to environmental allergies    no inhaler  . Diverticulosis of colon   . GERD (gastroesophageal reflux disease)   . History of esophageal dilatation    FOR STRICTURE IN 2005  . History of hiatal hernia   . History of hypercalcemia    SECONDARY TO HYPERPARATHYROID  . History of hyperparathyroidism    PRIMARY--  S/P RIGHT PARATHYROIDECTOMY  . History of kidney stones   . History of recurrent UTIs   . Hyperlipidemia   . Hypertension   . Hypothyroidism   . Incomplete right bundle branch block   . Nephrolithiasis    RIGHT   . Nocturia   . PONV (postoperative nausea and vomiting)   . Pre-diabetes   . Sciatica of right side     Past Surgical History:  Procedure Laterality Date  . APPENDECTOMY  1974  . CYSTOSCOPY WITH URETEROSCOPY AND STENT PLACEMENT Right 11/11/2014   Procedure: CYSTOSCOPY WITH URETEROSCOPY AND STENT PLACEMENT;  Surgeon: Crist Fat, MD;  Location: [facility];  Service: Urology;  Laterality: Right;  . EXCISION URETHRAL CARUNCLE  1981   Open procedure  . KNEE ARTHROSCOPY Left april 2010  . REMOVAL RIP  1973   First Rib on Right side-- for impingement  . RIGHT INFERIOR PARATHYROIDECTOMY, MINIMALLY INVASIVE   08-14-2008  . TONSILLECTOMY  as child  . TOTAL KNEE ARTHROPLASTY Left 11-17-2009  . TUBAL LIGATION  1973  . VAGINAL HYSTERECTOMY  1995   w/ Bilateral Salpingoophorectomy    There were no vitals filed for this visit.  Subjective Assessment - 07/03/18 1407    Subjective  Kerry Walters states that today is a bad day.  She is having more pain in her neck to her left shoulder; the pain is not going down her arm. She woke up this morning with the pain and could barely get out of bed.      Pertinent History  TKR,     Limitations  House hold activities;Sitting;Lifting    How long can you sit comfortably?  able to sit for varied time but no longer than an hour     Patient Stated Goals  PT is waking up 3-4 x a night in pain and having difficulty sleeping     Currently in Pain?  Yes    Pain Score  7     Pain Location  Neck    Pain Orientation  Left    Pain Descriptors / Indicators  Aching;Throbbing    Pain Type  Chronic pain    Pain Onset  More than a month ago    Pain  Frequency  Constant    Aggravating Factors   not sure     Pain Relieving Factors  pressing on it     Effect of Pain on Daily Activities  limits activity.                        [facility] Adult PT Treatment/Exercise - 07/03/18 0001      Exercises   Exercises  Neck      Neck Exercises: Seated   Cervical Isometrics  --    Neck Retraction  --    Neck Retraction Limitations  scapular retraction    Lateral Flexion  --    W Back  --    Shoulder Rolls  Backwards;10 reps      Neck Exercises: Supine   Neck Retraction  10 reps    Capital Flexion  5 reps;3 secs    Shoulder Flexion Limitations  elbow flexion only trying to lift UE causes radicular sx x 10     Other Supine Exercise  scapular retraction x 10     Other Supine Exercise  Shld IR/ER with wand, elbows at 90 degrees shoulder extension isometrics B x 10 with cervical stab; Protraction retraction x 10       Manual Therapy   Manual Therapy  Soft tissue  mobilization;Manual Traction    Manual therapy comments  done seperate from all other aspects of treatment.     Soft tissue mobilization  LT trap to trigger points.      Manual Traction  5x30"             PT Education - 07/03/18 1510    Education Details  when pt is in a high degree of pain work on stabilizing exercises in a supine position only.     Person(s) Educated  Patient    Methods  Explanation    Comprehension  Verbalized understanding       PT Short Term Goals - 06/28/18 1109      PT SHORT TERM GOAL #1   Title  PT rotation to improve to 50 degrees B to improve safety while driving.     Time  2    Period  Weeks    Status  On-going      PT SHORT TERM GOAL #2   Title  PT pain to be radiating no further than her shoulder area to demonstrate decreased nerve root irritation.     Time  2    Period  Weeks    Status  On-going      PT SHORT TERM GOAL #3   Title  Pt to be able to use LT hand for light activity like holding dishes while drying, folding clothes without increased pain.     Time  2    Period  Weeks    Status  On-going        PT Long Term Goals - 06/28/18 1110      PT LONG TERM GOAL #1   Title  PT cervical rotation to be 60 degrees B for safer driving.     Time  4    Period  Weeks    Status  On-going      PT LONG TERM GOAL #2   Title  PT pain level in her neck and Lt arm to be no greater than a 4/10 to allow pt to return to yard work activity.     Time  4  Period  Weeks    Status  On-going      PT LONG TERM GOAL #3   Title  Pt to no longer be having radicular sx to demonstrate decreased nerve root irritation.     Time  4    Period  Weeks    Status  On-going            Plan - 07/03/18 1511    Clinical Impression Statement  Pt with significant increased pain today and is unsure what caused the increase.  Pain decreased to a 4/10 after manual.  Instructed patient to stop any activity or exercise that causes pain into LT shoulder .     Rehab Potential  Good    PT Frequency  3x / week    PT Duration  4 weeks    PT Treatment/Interventions  ADLs/Self Care Home Management;Therapeutic activities;Therapeutic exercise;Patient/family education;Manual techniques;Passive range of motion;Joint Manipulations    PT Next Visit Plan  PT will be on vacation for the next week will resume stabilization exercises when pt returns.     PT Home Exercise Plan  HEP:  cervical and scapular retraction,  sitting tall, Lt SB, and extension.     Consulted and Agree with Plan of Care  Patient       Patient will benefit from skilled therapeutic intervention in order to improve the following deficits and impairments:  Decreased activity tolerance, Decreased range of motion, Decreased strength, Hypomobility, Increased fascial restricitons, Increased muscle spasms, Impaired UE functional use, Obesity, Pain  Visit Diagnosis: Radiculopathy, cervical region  Abnormal posture     Problem List Patient Active Problem List   Diagnosis Date Noted  . Personal history of kidney stones 02/23/2015  . Atherosclerosis 02/23/2015  . Hx of low back pain 05/06/2014  . Encounter for preventive health examination 05/06/2014  . BMI 32.0-32.9,adult 11/05/2013  . Throat clearing 05/03/2013  . Nocturnal leg cramps 10/31/2012  . Low back pain 12/27/2011  . Pre-diabetes 09/27/2011  . Menopausal hot flushes 04/12/2011  . Knee pain 11/22/2010  . OBESITY 12/23/2008  . NEPHROLITHIASIS, HX OF 03/13/2008  . Hypothyroidism 06/29/2007  . HYPERLIPIDEMIA 06/29/2007  . Essential hypertension 06/29/2007  . ALLERGIC RHINITIS 06/29/2007  . GERD 06/29/2007  . POSTMENOPAUSAL STATUS 06/29/2007  . LEG PAIN, CHRONIC 06/29/2007  . HYPERGLYCEMIA, FASTING 06/29/2007    Virgina Organ, PT CLT [phone] 07/03/2018, 3:13 PM  [facility] [address] [place], [place], [address] Phone: [phone]   Fax:  [phone]  Name: Kerry Walters MRN: [id number] Date of Birth: March 08, 1946

## 2018-07-16 ENCOUNTER — Other Ambulatory Visit: Payer: Self-pay | Admitting: Family Medicine

## 2018-07-17 ENCOUNTER — Other Ambulatory Visit: Payer: Self-pay

## 2018-07-17 ENCOUNTER — Ambulatory Visit (HOSPITAL_COMMUNITY): Payer: Medicare Other | Admitting: Physical Therapy

## 2018-07-17 ENCOUNTER — Encounter (HOSPITAL_COMMUNITY): Payer: Self-pay | Admitting: Physical Therapy

## 2018-07-17 DIAGNOSIS — M5412 Radiculopathy, cervical region: Secondary | ICD-10-CM

## 2018-07-17 DIAGNOSIS — R293 Abnormal posture: Secondary | ICD-10-CM

## 2018-07-17 NOTE — Therapy (Addendum)
[facility] [address] [place], [place], [address] Phone: [phone]   Fax:  [phone]  Physical Therapy Treatment  Patient Details  Name: Kerry Walters MRN: [id number] Date of Birth: 20-Oct-1945 Referring Provider (PT): Odette Fraction  Progress Note Reporting Period 06/06/2018  to 07/17/2018  See note below for Objective Data and Assessment of Progress/Goals.      Encounter Date: 07/17/2018  PT End of Session - 07/17/18 1434    Visit Number  4    Number of Visits  12    Date for PT Re-Evaluation  07/25/18    Authorization Type  [employer] M[employer]     PT Start Time  1345    PT Stop Time  1430    PT Time Calculation (min)  45 min    Activity Tolerance  Patient tolerated treatment well    Behavior During Therapy  WFL for tasks assessed/performed       Past Medical History:  Diagnosis Date  . Asthma due to environmental allergies    no inhaler  . Diverticulosis of colon   . GERD (gastroesophageal reflux disease)   . History of esophageal dilatation    FOR STRICTURE IN 2005  . History of hiatal hernia   . History of hypercalcemia    SECONDARY TO HYPERPARATHYROID  . History of hyperparathyroidism    PRIMARY--  S/P RIGHT PARATHYROIDECTOMY  . History of kidney stones   . History of recurrent UTIs   . Hyperlipidemia   . Hypertension   . Hypothyroidism   . Incomplete right bundle branch block   . Nephrolithiasis    RIGHT   . Nocturia   . PONV (postoperative nausea and vomiting)   . Pre-diabetes   . Sciatica of right side     Past Surgical History:  Procedure Laterality Date  . APPENDECTOMY  1974  . CYSTOSCOPY WITH URETEROSCOPY AND STENT PLACEMENT Right 11/11/2014   Procedure: CYSTOSCOPY WITH URETEROSCOPY AND STENT PLACEMENT;  Surgeon: Crist Fat, MD;  Location: [facility];  Service: Urology;  Laterality: Right;  . EXCISION URETHRAL CARUNCLE  1981   Open procedure  . KNEE ARTHROSCOPY Left april  2010  . REMOVAL RIP  1973   First Rib on Right side-- for impingement  . RIGHT INFERIOR PARATHYROIDECTOMY, MINIMALLY INVASIVE  08-14-2008  . TONSILLECTOMY  as child  . TOTAL KNEE ARTHROPLASTY Left 11-17-2009  . TUBAL LIGATION  1973  . VAGINAL HYSTERECTOMY  1995   w/ Bilateral Salpingoophorectomy    There were no vitals filed for this visit.  Subjective Assessment - 07/17/18 1348    Subjective  Ms. Kerry Walters states that she just got back Sunday from her bus trip to [place].  She states that there were two stops along the way.  She purchased a cervical pillow to help support her neck which helped.      Pertinent History  TKR,     Limitations  House hold activities;Sitting;Lifting    How long can you sit comfortably?  able to sit for varied time but no longer than a couple of hours was an hour     Patient Stated Goals  PT is waking up 3-4 x a night in pain and having difficulty sleeping     Currently in Pain?  Yes    Pain Score  5     Pain Location  Neck    Pain Orientation  Lower    Pain Descriptors / Indicators  Aching  Pain Radiating Towards  Rt shoulder intermittent; Lt to the elbow     Pain Onset  More than a month ago    Pain Frequency  Constant   Lt is constant; Rt is intermittent.    Aggravating Factors   moving her head     Pain Relieving Factors  HEMP     Effect of Pain on Daily Activities  limits          [facility] PT Assessment - 07/17/18 0001      Assessment   Medical Diagnosis  cervical radiculopathy LT     Referring Provider (PT)  Odette Fraction    Onset Date/Surgical Date  10/13/17    Prior Therapy  chiropractor      Precautions   Precautions  None      Restrictions   Weight Bearing Restrictions  No      Prior Function   Level of Independence  Independent    Vocation  Retired    Leisure  yard work       Cognition   Overall Cognitive Status  Within Functional Limits for tasks assessed      Observation/Other Assessments   Focus on Therapeutic Outcomes (FOTO)    48      Posture/Postural Control   Posture/Postural Control  Postural limitations    Postural Limitations  Rounded Shoulders;Forward head;Increased thoracic kyphosis      AROM   Cervical Extension  35   was 35 no change in pain level with  reps    Cervical - Right Side Bend  25   was 20 reps causes no change to  pain    Cervical - Left Side Bend  15   was 12 reps cause no change    Cervical - Right Rotation  35   was 35   Cervical - Left Rotation  30   was 25      Strength   Cervical Extension  3+/5    Cervical - Right Side Bend  3+/5    Cervical - Left Side Bend  3+/5      Palpation   Palpation comment  moderate  mm spasm                   [facility] Adult PT Treatment/Exercise - 07/17/18 0001      Exercises   Exercises  Neck      Neck Exercises: Seated   Cervical Isometrics  --   extension with towel support x 10   Neck Retraction Limitations  scapular retraction    Shoulder Rolls  Backwards;10 reps      Neck Exercises: Supine   Cervical Isometrics  Extension;Right lateral flexion;Left lateral flexion;3 secs;5 reps    Neck Retraction  10 reps      Manual Therapy   Manual Therapy  Soft tissue mobilization;Manual Traction    Manual therapy comments  done seperate from all other aspects of treatment.     Soft tissue mobilization  LT trap to trigger points.      Manual Traction  5x30"               PT Short Term Goals - 07/17/18 1401      PT SHORT TERM GOAL #1   Title  PT rotation to improve to 50 degrees B to improve safety while driving.     Time  2    Period  Weeks    Status  On-going      PT SHORT  TERM GOAL #2   Title  PT pain to be radiating no further than her shoulder area to demonstrate decreased nerve root irritation.     Time  2    Period  Weeks    Status  On-going      PT SHORT TERM GOAL #3   Title  Pt to be able to use LT hand for light activity like holding dishes while drying, folding clothes without increased pain.      Time  2    Period  Weeks    Status  On-going        PT Long Term Goals - 07/17/18 1402      PT LONG TERM GOAL #1   Title  PT cervical rotation to be 60 degrees B for safer driving.     Time  4    Period  Weeks    Status  On-going      PT LONG TERM GOAL #2   Title  PT pain level in her neck and Lt arm to be no greater than a 4/10 to allow pt to return to yard work activity.     Time  4    Period  Weeks    Status  On-going      PT LONG TERM GOAL #3   Title  Pt to no longer be having radicular sx to demonstrate decreased nerve root irritation.     Time  4    Period  Weeks    Status  On-going            Plan - 07/17/18 1440    Clinical Impression Statement  Pt has been on an excursion to [place] via a bus, trip was 10 days long,  which she states did not do her neck any good.  PT reassessed and has made very little progress but this is only her fourth treatment.  Therapist spoke to patient about the difficulty of having a postitive outcome with therapy secondary to pt having so many levels of involvment.  Pt vocalized understanding but still wants to attempt further consevative treatment prior to surgery.     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   We will continue with manual and cervical stability exercises.  Will need to stay with supine stability exercises until pt can complete B UE flexion and abduction without radicular sx.     PT Home Exercise Plan  HEP:  cervical and scapular retraction,  sitting tall, Lt  SB, and extension.     Consulted and Agree with Plan of Care  Patient       Patient will benefit from skilled therapeutic intervention in order to improve the following deficits and impairments:  Decreased activity tolerance, Decreased range of motion, Decreased strength, Hypomobility, Increased fascial restricitons, Increased muscle spasms, Impaired UE functional use, Obesity, Pain  Visit Diagnosis: Radiculopathy, cervical region - Plan: PT plan of care cert/re-cert  Abnormal posture - Plan: PT plan of care cert/re-cert  Problem List Patient Active Problem List   Diagnosis Date Noted  . Personal history of kidney stones 02/23/2015  . Atherosclerosis 02/23/2015  . Hx of low back pain 05/06/2014  . Encounter for preventive health examination 05/06/2014  . BMI 32.0-32.9,adult 11/05/2013  . Throat clearing 05/03/2013  . Nocturnal leg cramps 10/31/2012  . Low back pain 12/27/2011  . Pre-diabetes 09/27/2011  . Menopausal hot flushes 04/12/2011  . Knee pain 11/22/2010  . OBESITY 12/23/2008  . NEPHROLITHIASIS, HX OF 03/13/2008  . Hypothyroidism 06/29/2007  . HYPERLIPIDEMIA 06/29/2007  . Essential hypertension 06/29/2007  . ALLERGIC RHINITIS 06/29/2007  . GERD 06/29/2007  . POSTMENOPAUSAL STATUS 06/29/2007  . LEG PAIN, CHRONIC 06/29/2007  . HYPERGLYCEMIA, FASTING 06/29/2007  Virgina Organynthia , PT CLT [phone] 07/17/2018, 2:48 PM  [facility] [address] [place], [place], [address] Phone: [phone]   Fax:  [phone]  Name: Lucious GrovesMary Sue Caputi MRN: [id number] Date of Birth: 12-08-1945

## 2018-07-19 ENCOUNTER — Ambulatory Visit (HOSPITAL_COMMUNITY): Payer: Medicare Other

## 2018-07-19 DIAGNOSIS — M5412 Radiculopathy, cervical region: Secondary | ICD-10-CM | POA: Diagnosis not present

## 2018-07-19 DIAGNOSIS — R293 Abnormal posture: Secondary | ICD-10-CM

## 2018-07-19 NOTE — Therapy (Signed)
[facility] [address] [place], [place], [address] Phone: [phone]   Fax:  [phone]  Physical Therapy Treatment  Patient Details  Name: Kerry Walters MRN: [id number] Date of Birth: May 16, 1946 Referring Provider (PT): Odette Fraction   Encounter Date: 07/19/2018  PT End of Session - 07/19/18 1342    Visit Number  5    Number of Visits  12    Date for PT Re-Evaluation  07/25/18    Authorization Type  [employer] MEd[employer]     Authorization - Visit Number  5    Authorization - Number of Visits  10    PT Start Time  1345    PT Stop Time  1433    PT Time Calculation (min)  48 min    Activity Tolerance  Patient tolerated treatment well    Behavior During Therapy  [facility] for tasks assessed/performed       Past Medical History:  Diagnosis Date  . Asthma due to environmental allergies    no inhaler  . Diverticulosis of colon   . GERD (gastroesophageal reflux disease)   . History of esophageal dilatation    FOR STRICTURE IN 2005  . History of hiatal hernia   . History of hypercalcemia    SECONDARY TO HYPERPARATHYROID  . History of hyperparathyroidism    PRIMARY--  S/P RIGHT PARATHYROIDECTOMY  . History of kidney stones   . History of recurrent UTIs   . Hyperlipidemia   . Hypertension   . Hypothyroidism   . Incomplete right bundle branch block   . Nephrolithiasis    RIGHT   . Nocturia   . PONV (postoperative nausea and vomiting)   . Pre-diabetes   . Sciatica of right side     Past Surgical History:  Procedure Laterality Date  . APPENDECTOMY  1974  . CYSTOSCOPY WITH URETEROSCOPY AND STENT PLACEMENT Right 11/11/2014   Procedure: CYSTOSCOPY WITH URETEROSCOPY AND STENT PLACEMENT;  Surgeon: Crist Fat, MD;  Location: [facility];  Service: Urology;  Laterality: Right;  . EXCISION URETHRAL CARUNCLE  1981   Open procedure  . KNEE ARTHROSCOPY Left april 2010  . REMOVAL RIP  1973   First Rib on Right side-- for  impingement  . RIGHT INFERIOR PARATHYROIDECTOMY, MINIMALLY INVASIVE  08-14-2008  . TONSILLECTOMY  as child  . TOTAL KNEE ARTHROPLASTY Left 11-17-2009  . TUBAL LIGATION  1973  . VAGINAL HYSTERECTOMY  1995   w/ Bilateral Salpingoophorectomy    There were no vitals filed for this visit.  Subjective Assessment - 07/19/18 1342    Subjective  Patient reports she hurt a lot yesterday but is much better today. Maybe a 4/10 in left arm.    Pertinent History  TKR,     Limitations  House hold activities;Sitting;Lifting    How long can you sit comfortably?  able to sit for varied time but no longer than a couple of hours was an hour     Patient Stated Goals  PT is waking up 3-4 x a night in pain and having difficulty sleeping     Pain Onset  More than a month ago                       [facility] Adult PT Treatment/Exercise - 07/19/18 0001      Posture/Postural Control   Posture/Postural Control  Postural limitations    Postural Limitations  Rounded Shoulders;Forward head;Increased thoracic kyphosis  Exercises   Exercises  Neck      Neck Exercises: Seated   Neck Retraction Limitations  scapular retraction    Shoulder Rolls  Backwards;10 reps      Neck Exercises: Supine   Cervical Isometrics  Extension;Right lateral flexion;Left lateral flexion;3 secs;5 reps    Neck Retraction  10 reps      Manual Therapy   Manual Therapy  Soft tissue mobilization;Myofascial release;Passive ROM    Manual therapy comments  done seperate from all other aspects of treatment.     Soft tissue mobilization  LT trap to trigger points.      Myofascial Release  suboccipital release    Passive ROM  Lt UE with and without traction into flex and abd    Manual Traction  --             PT Education - 07/19/18 1445    Education Details  Discussed purpose and technique of interventions throughout session. Step-stool for clothes out of washer, chin tuck/head lift, correct seated posture to  decrease stress on left shoulder and c-spine    Person(s) Educated  Patient    Methods  Explanation;Demonstration    Comprehension  Verbalized understanding;Verbal cues required;Need further instruction       PT Short Term Goals - 07/17/18 1401      PT SHORT TERM GOAL #1   Title  PT rotation to improve to 50 degrees B to improve safety while driving.     Time  2    Period  Weeks    Status  On-going      PT SHORT TERM GOAL #2   Title  PT pain to be radiating no further than her shoulder area to demonstrate decreased nerve root irritation.     Time  2    Period  Weeks    Status  On-going      PT SHORT TERM GOAL #3   Title  Pt to be able to use LT hand for light activity like holding dishes while drying, folding clothes without increased pain.     Time  2    Period  Weeks    Status  On-going        PT Long Term Goals - 07/17/18 1402      PT LONG TERM GOAL #1   Title  PT cervical rotation to be 60 degrees B for safer driving.     Time  4    Period  Weeks    Status  On-going      PT LONG TERM GOAL #2   Title  PT pain level in her neck and Lt arm to be no greater than a 4/10 to allow pt to return to yard work activity.     Time  4    Period  Weeks    Status  On-going      PT LONG TERM GOAL #3   Title  Pt to no longer be having radicular sx to demonstrate decreased nerve root irritation.     Time  4    Period  Weeks    Status  On-going            Plan - 07/19/18 1344    Clinical Impression Statement  Continued with established plan of care. Session focused on neuromuscular re-education for cervical spine and scapular protraction, as well as manual therapy for pain relief. Added C-spine rotation on mylar ball for axial rotation relaxation. Patient c/o rotation motion being  stuck/blocked at a certain point of left neck rotation. Soft tissue mobilizations to left levator scapulae and suboccipital release -re-created patients neck and left arm pain. PT educated patient  on cervical spine and scapular retraction and connection to some of her left shoulder pain with movement. Patient to attempt step stool use to help get clothes out of washer with less pain. Continue with current plan of care, progress as able.     Rehab Potential  Fair    PT Frequency  3x / week    PT Duration  4 weeks    PT Treatment/Interventions  ADLs/Self Care Home Management;Therapeutic activities;Therapeutic exercise;Patient/family education;Manual techniques;Passive range of motion;Joint Manipulations    PT Next Visit Plan  Continue with skilled therapy as pt has only had four treatments due to going on vacation.   We will continue with manual and cervical stability exercises.  Will need to stay with supine stability exercises until pt can complete B UE flexion and abduction without radicular sx.     PT Home Exercise Plan  HEP:  cervical and scapular retraction,  sitting tall, Lt SB, and extension.     Consulted and Agree with Plan of Care  Patient       Patient will benefit from skilled therapeutic intervention in order to improve the following deficits and impairments:  Decreased activity tolerance, Decreased range of motion, Decreased strength, Hypomobility, Increased fascial restricitons, Increased muscle spasms, Impaired UE functional use, Obesity, Pain  Visit Diagnosis: Radiculopathy, cervical region  Abnormal posture     Problem List Patient Active Problem List   Diagnosis Date Noted  . Personal history of kidney stones 02/23/2015  . Atherosclerosis 02/23/2015  . Hx of low back pain 05/06/2014  . Encounter for preventive health examination 05/06/2014  . BMI 32.0-32.9,adult 11/05/2013  . Throat clearing 05/03/2013  . Nocturnal leg cramps 10/31/2012  . Low back pain 12/27/2011  . Pre-diabetes 09/27/2011  . Menopausal hot flushes 04/12/2011  . Knee pain 11/22/2010  . OBESITY 12/23/2008  . NEPHROLITHIASIS, HX OF 03/13/2008  . Hypothyroidism 06/29/2007  . HYPERLIPIDEMIA  06/29/2007  . Essential hypertension 06/29/2007  . ALLERGIC RHINITIS 06/29/2007  . GERD 06/29/2007  . POSTMENOPAUSAL STATUS 06/29/2007  . LEG PAIN, CHRONIC 06/29/2007  . HYPERGLYCEMIA, FASTING 06/29/2007    Katina DungBarbara D. Hartnett-Rands, MS, PT Per Ladoris GeneDiem PT [facility] #[id number] 07/19/2018, 2:53 PM  [place] [facility] [address] [place], [place], [address] Phone: [phone]   Fax:  [phone]  Name: Lucious GrovesMary Sue Ferns MRN: [id number] Date of Birth: 1946/03/11

## 2018-07-23 DIAGNOSIS — M9902 Segmental and somatic dysfunction of thoracic region: Secondary | ICD-10-CM | POA: Diagnosis not present

## 2018-07-23 DIAGNOSIS — M5136 Other intervertebral disc degeneration, lumbar region: Secondary | ICD-10-CM | POA: Diagnosis not present

## 2018-07-23 DIAGNOSIS — M5134 Other intervertebral disc degeneration, thoracic region: Secondary | ICD-10-CM | POA: Diagnosis not present

## 2018-07-23 DIAGNOSIS — M9903 Segmental and somatic dysfunction of lumbar region: Secondary | ICD-10-CM | POA: Diagnosis not present

## 2018-07-24 DIAGNOSIS — M5412 Radiculopathy, cervical region: Secondary | ICD-10-CM | POA: Diagnosis not present

## 2018-07-31 ENCOUNTER — Ambulatory Visit (HOSPITAL_COMMUNITY): Payer: Medicare Other | Attending: Anesthesiology | Admitting: Physical Therapy

## 2018-07-31 ENCOUNTER — Encounter (HOSPITAL_COMMUNITY): Payer: Self-pay | Admitting: Physical Therapy

## 2018-07-31 DIAGNOSIS — M5412 Radiculopathy, cervical region: Secondary | ICD-10-CM | POA: Diagnosis not present

## 2018-07-31 DIAGNOSIS — R293 Abnormal posture: Secondary | ICD-10-CM | POA: Diagnosis not present

## 2018-07-31 NOTE — Therapy (Signed)
[facility] [address] [place], [place], [address] Phone: [phone]   Fax:  [phone]  Physical Therapy Treatment  Patient Details  Name: Kerry Walters MRN: [id number] Date of Birth: 07-15-1946 Referring Provider (PT): Odette Fraction   Encounter Date: 07/31/2018  PT End of Session - 07/31/18 1406    Visit Number  6    Number of Visits  12    Date for PT Re-Evaluation  07/25/18    Authorization Type  [employer] MEd[employer]     Authorization - Visit Number  6    Authorization - Number of Visits  10    PT Start Time  1345    PT Stop Time  1430    PT Time Calculation (min)  45 min    Activity Tolerance  Patient tolerated treatment well    Behavior During Therapy  [facility] for tasks assessed/performed       Past Medical History:  Diagnosis Date  . Asthma due to environmental allergies    no inhaler  . Diverticulosis of colon   . GERD (gastroesophageal reflux disease)   . History of esophageal dilatation    FOR STRICTURE IN 2005  . History of hiatal hernia   . History of hypercalcemia    SECONDARY TO HYPERPARATHYROID  . History of hyperparathyroidism    PRIMARY--  S/P RIGHT PARATHYROIDECTOMY  . History of kidney stones   . History of recurrent UTIs   . Hyperlipidemia   . Hypertension   . Hypothyroidism   . Incomplete right bundle branch block   . Nephrolithiasis    RIGHT   . Nocturia   . PONV (postoperative nausea and vomiting)   . Pre-diabetes   . Sciatica of right side     Past Surgical History:  Procedure Laterality Date  . APPENDECTOMY  1974  . CYSTOSCOPY WITH URETEROSCOPY AND STENT PLACEMENT Right 11/11/2014   Procedure: CYSTOSCOPY WITH URETEROSCOPY AND STENT PLACEMENT;  Surgeon: Crist Fat, MD;  Location: [facility];  Service: Urology;  Laterality: Right;  . EXCISION URETHRAL CARUNCLE  1981   Open procedure  . KNEE ARTHROSCOPY Left april 2010  . REMOVAL RIP  1973   First Rib on Right side-- for  impingement  . RIGHT INFERIOR PARATHYROIDECTOMY, MINIMALLY INVASIVE  08-14-2008  . TONSILLECTOMY  as child  . TOTAL KNEE ARTHROPLASTY Left 11-17-2009  . TUBAL LIGATION  1973  . VAGINAL HYSTERECTOMY  1995   w/ Bilateral Salpingoophorectomy    There were no vitals filed for this visit.  Subjective Assessment - 07/31/18 1344    Subjective  Kerry Walters that she had a shot last Tuesday and she feels better.      Pertinent History  TKR,     Limitations  House hold activities;Sitting;Lifting    How long can you sit comfortably?  able to sit for varied time but no longer than a couple of hours was an hour     Patient Stated Goals  PT is waking up 3-4 x a night in pain and having difficulty sleeping     Currently in Pain?  Yes    Pain Score  1     Pain Location  Neck    Pain Descriptors / Indicators  Aching    Pain Type  Chronic pain    Pain Radiating Towards  Lt shoulder     Pain Onset  More than a month ago    Aggravating Factors   working  Pain Relieving Factors  shot                        [facility] Adult PT Treatment/Exercise - 07/31/18 0001      Exercises   Exercises  Neck      Neck Exercises: Seated   Cervical Isometrics  --      Neck Exercises: Supine   Cervical Isometrics  Extension;Right lateral flexion;Left lateral flexion;3 secs;5 reps    Neck Retraction  10 reps    Cervical Rotation  Both;5 reps    Cervical Rotation Limitations  with chin tucks     Other Supine Exercise  scapular retraction x 10     Other Supine Exercise  t band decompression exercises 1-5       Manual Therapy   Manual Therapy  Soft tissue mobilization;Myofascial release;Passive ROM    Manual therapy comments  done seperate from all other aspects of treatment.     Soft tissue mobilization  LT trap to trigger points.      Myofascial Release  suboccipital release    Manual Traction  5x30"               PT Short Term Goals - 07/17/18 1401      PT SHORT TERM GOAL #1    Title  PT rotation to improve to 50 degrees B to improve safety while driving.     Time  2    Period  Weeks    Status  On-going      PT SHORT TERM GOAL #2   Title  PT pain to be radiating no further than her shoulder area to demonstrate decreased nerve root irritation.     Time  2    Period  Weeks    Status  On-going      PT SHORT TERM GOAL #3   Title  Pt to be able to use LT hand for light activity like holding dishes while drying, folding clothes without increased pain.     Time  2    Period  Weeks    Status  On-going        PT Long Term Goals - 07/17/18 1402      PT LONG TERM GOAL #1   Title  PT cervical rotation to be 60 degrees B for safer driving.     Time  4    Period  Weeks    Status  On-going      PT LONG TERM GOAL #2   Title  PT pain level in her neck and Lt arm to be no greater than a 4/10 to allow pt to return to yard work activity.     Time  4    Period  Weeks    Status  On-going      PT LONG TERM GOAL #3   Title  Pt to no longer be having radicular sx to demonstrate decreased nerve root irritation.     Time  4    Period  Weeks    Status  On-going            Plan - 07/31/18 1407    Clinical Impression Statement  Added t-band supine decompression exerices to program.  PT able to complete with verbal and tactile cuing but unable to go full range.  PROM continues to be limited     Rehab Potential  Fair    PT Frequency  3x / week  PT Duration  4 weeks    PT Treatment/Interventions  ADLs/Self Care Home Management;Therapeutic activities;Therapeutic exercise;Patient/family education;Manual techniques;Passive range of motion;Joint Manipulations    PT Next Visit Plan     We will continue with manual and cervical stability exercises. Attempt sitting isometrics, ROM but do not complete if pt has any radicular sx     PT Home Exercise Plan  HEP:  cervical and scapular retraction,  sitting tall, Lt SB, and extension.     Consulted and Agree with Plan of Care   Patient       Patient will benefit from skilled therapeutic intervention in order to improve the following deficits and impairments:  Decreased activity tolerance, Decreased range of motion, Decreased strength, Hypomobility, Increased fascial restricitons, Increased muscle spasms, Impaired UE functional use, Obesity, Pain  Visit Diagnosis: Radiculopathy, cervical region  Abnormal posture     Problem List Patient Active Problem List   Diagnosis Date Noted  . Personal history of kidney stones 02/23/2015  . Atherosclerosis 02/23/2015  . Hx of low back pain 05/06/2014  . Encounter for preventive health examination 05/06/2014  . BMI 32.0-32.9,adult 11/05/2013  . Throat clearing 05/03/2013  . Nocturnal leg cramps 10/31/2012  . Low back pain 12/27/2011  . Pre-diabetes 09/27/2011  . Menopausal hot flushes 04/12/2011  . Knee pain 11/22/2010  . OBESITY 12/23/2008  . NEPHROLITHIASIS, HX OF 03/13/2008  . Hypothyroidism 06/29/2007  . HYPERLIPIDEMIA 06/29/2007  . Essential hypertension 06/29/2007  . ALLERGIC RHINITIS 06/29/2007  . GERD 06/29/2007  . POSTMENOPAUSAL STATUS 06/29/2007  . LEG PAIN, CHRONIC 06/29/2007  . HYPERGLYCEMIA, FASTING 06/29/2007    Kerry Walters , PT CLT [phone] 07/31/2018, 2:37 PM  [facility] [address] [place], [place], [address] Phone: [phone]   Fax:  [phone]  Name: Kerry Walters MRN: [id number] Date of Birth: 16-Sep-1945

## 2018-08-02 ENCOUNTER — Encounter (HOSPITAL_COMMUNITY): Payer: Self-pay

## 2018-08-02 ENCOUNTER — Ambulatory Visit (HOSPITAL_COMMUNITY): Payer: Medicare Other

## 2018-08-02 DIAGNOSIS — M5412 Radiculopathy, cervical region: Secondary | ICD-10-CM | POA: Diagnosis not present

## 2018-08-02 DIAGNOSIS — R293 Abnormal posture: Secondary | ICD-10-CM | POA: Diagnosis not present

## 2018-08-02 NOTE — Therapy (Signed)
[facility] [address] [place], [place], [address] Phone: [phone]   Fax:  [phone]  Physical Therapy Treatment  Patient Details  Name: Kerry GrovesMary Sue Walters MRN: [id number] Date of Birth: [birth year] Referring Provider (PT): Odette FractionPaul Harkins   Encounter Date: 08/02/2018  PT End of Session - 08/02/18 1528    Visit Number  7    Number of Visits  12    Date for PT Re-Evaluation  08/12/18    Authorization Type  [employer] MEdicare     Authorization Time Period  11/19-->08/12/2018    Authorization - Visit Number  7    Authorization - Number of Visits  10    PT Start Time  1522    PT Stop Time  1605    PT Time Calculation (min)  43 min    Activity Tolerance  Patient tolerated treatment well    Behavior During Therapy  [facility] for tasks assessed/performed       Past Medical History:  Diagnosis Date  . Asthma due to environmental allergies    no inhaler  . Diverticulosis of colon   . GERD (gastroesophageal reflux disease)   . History of esophageal dilatation    FOR STRICTURE IN 2005  . History of hiatal hernia   . History of hypercalcemia    SECONDARY TO HYPERPARATHYROID  . History of hyperparathyroidism    PRIMARY--  S/P RIGHT PARATHYROIDECTOMY  . History of kidney stones   . History of recurrent UTIs   . Hyperlipidemia   . Hypertension   . Hypothyroidism   . Incomplete right bundle branch block   . Nephrolithiasis    RIGHT   . Nocturia   . PONV (postoperative nausea and vomiting)   . Pre-diabetes   . Sciatica of right side     Past Surgical History:  Procedure Laterality Date  . APPENDECTOMY  1974  . CYSTOSCOPY WITH URETEROSCOPY AND STENT PLACEMENT Right 11/11/2014   Procedure: CYSTOSCOPY WITH URETEROSCOPY AND STENT PLACEMENT;  Surgeon: Crist FatBenjamin W Herrick, MD;  [facility];  Service: Urology;  Laterality: Right;  . EXCISION URETHRAL CARUNCLE  1981   Open procedure  . KNEE ARTHROSCOPY Left april 2010  .  REMOVAL RIP  1973   First Rib on Right side-- for impingement  . RIGHT INFERIOR PARATHYROIDECTOMY, MINIMALLY INVASIVE  08-14-2008  . TONSILLECTOMY  as child  . TOTAL KNEE ARTHROPLASTY Left 11-17-2009  . TUBAL LIGATION  1973  . VAGINAL HYSTERECTOMY  1995   w/ Bilateral Salpingoophorectomy    There were no vitals filed for this visit.  Subjective Assessment - 08/02/18 1526    Subjective  Pt reports no radicular symptoms since recieving shot a week ago, current pain scale 1/10 Lt side of neck.    Pertinent History  TKR,     Patient Stated Goals  PT is waking up 3-4 x a night in pain and having difficulty sleeping     Currently in Pain?  Yes    Pain Score  1     Pain Location  Neck    Pain Orientation  Left;Lower    Pain Type  Chronic pain    Pain Radiating Towards  no radicular symptoms today    Pain Onset  More than a month ago    Pain Frequency  Constant    Aggravating Factors   working    Pain Relieving Factors  shot    Effect of Pain on Daily Activities  limits  [facility] PT Assessment - 08/02/18 0001      Assessment   Medical Diagnosis  cervical radiculopathy LT     Referring Provider (PT)  Odette Fraction    Onset Date/Surgical Date  10/13/17    Next MD Visit  08/09/18    Prior Therapy  chiropractor                   [facility] Adult PT Treatment/Exercise - 08/02/18 0001      Exercises   Exercises  Neck      Neck Exercises: Seated   Cervical Isometrics  Extension;Right lateral flexion;Left lateral flexion;3 secs;5 reps    Cervical Isometrics Limitations  3-5" holds each direciotn    Neck Retraction  10 reps    Neck Retraction Limitations  chin tuck    Cervical Rotation  10 reps    Cervical Rotation Limitations  with chin tucks    Lateral Flexion  Left;5 reps    Other Seated Exercise  3D cervical excursions 10x each slow and controlled movements (pain and limited mobility with Bil side bend and rotation)    Other Seated Exercise  scapular retraction       Neck Exercises: Supine   Other Supine Exercise  t band decompression exercises 1-4 10 reps      Manual Therapy   Manual Therapy  Soft tissue mobilization;Myofascial release;Passive ROM    Manual therapy comments  done seperate from all other aspects of treatment.     Soft tissue mobilization  LT trap to trigger points.      Myofascial Release  suboccipital release    Manual Traction  5x30"               PT Short Term Goals - 07/17/18 1401      PT SHORT TERM GOAL #1   Title  PT rotation to improve to 50 degrees B to improve safety while driving.     Time  2    Period  Weeks    Status  On-going      PT SHORT TERM GOAL #2   Title  PT pain to be radiating no further than her shoulder area to demonstrate decreased nerve root irritation.     Time  2    Period  Weeks    Status  On-going      PT SHORT TERM GOAL #3   Title  Pt to be able to use LT hand for light activity like holding dishes while drying, folding clothes without increased pain.     Time  2    Period  Weeks    Status  On-going        PT Long Term Goals - 07/17/18 1402      PT LONG TERM GOAL #1   Title  PT cervical rotation to be 60 degrees B for safer driving.     Time  4    Period  Weeks    Status  On-going      PT LONG TERM GOAL #2   Title  PT pain level in her neck and Lt arm to be no greater than a 4/10 to allow pt to return to yard work activity.     Time  4    Period  Weeks    Status  On-going      PT LONG TERM GOAL #3   Title  Pt to no longer be having radicular sx to demonstrate decreased nerve root irritation.  Time  4    Period  Weeks    Status  On-going            Plan - 08/02/18 1554    Clinical Impression Statement  Session focus on cervical mobility and postural strengthening.  Reviewed form with decompression theraband exercises with min cueing for form.  EOS with manual to address soft tissue restrictions to assist wiht mobility.  Pt continues to demonstrate limited  AROM and PROM with sidebend and rotation.  Added 3D cervical rotation to address mobility limitations and isometrics for strengthening.  No reoprts of increased pain through session and no radicular symptoms.      Rehab Potential  Fair    PT Frequency  3x / week    PT Duration  4 weeks    PT Treatment/Interventions  ADLs/Self Care Home Management;Therapeutic activities;Therapeutic exercise;Patient/family education;Manual techniques;Passive range of motion;Joint Manipulations    PT Next Visit Plan  Add RTB rows next session.  Continue with manual and cervical stability exercises. Continue sitting isometrics, ROM but do not complete if pt has any radicular sx     PT Home Exercise Plan  HEP:  cervical and scapular retraction,  sitting tall, Lt SB, and extension.        Patient will benefit from skilled therapeutic intervention in order to improve the following deficits and impairments:  Decreased activity tolerance, Decreased range of motion, Decreased strength, Hypomobility, Increased fascial restricitons, Increased muscle spasms, Impaired UE functional use, Obesity, Pain  Visit Diagnosis: Radiculopathy, cervical region  Abnormal posture     Problem List Patient Active Problem List   Diagnosis Date Noted  . Personal history of kidney stones 02/23/2015  . Atherosclerosis 02/23/2015  . Hx of low back pain 05/06/2014  . Encounter for preventive health examination 05/06/2014  . BMI 32.0-32.9,adult 11/05/2013  . Throat clearing 05/03/2013  . Nocturnal leg cramps 10/31/2012  . Low back pain 12/27/2011  . Pre-diabetes 09/27/2011  . Menopausal hot flushes 04/12/2011  . Knee pain 11/22/2010  . OBESITY 12/23/2008  . NEPHROLITHIASIS, HX OF 03/13/2008  . Hypothyroidism 06/29/2007  . HYPERLIPIDEMIA 06/29/2007  . Essential hypertension 06/29/2007  . ALLERGIC RHINITIS 06/29/2007  . GERD 06/29/2007  . POSTMENOPAUSAL STATUS 06/29/2007  . LEG PAIN, CHRONIC 06/29/2007  . HYPERGLYCEMIA, FASTING  06/29/2007   Becky Sax, LPTA; CBIS [phone]  Juel Burrow 08/02/2018, 5:12 PM  [facility] [address] [place], [place], [address] Phone: [phone]   Fax:  [phone]  Name: Ahmya Bernick MRN: [id number] Date of Birth: 09-Apr-1946

## 2018-08-03 ENCOUNTER — Telehealth (HOSPITAL_COMMUNITY): Payer: Self-pay | Admitting: Internal Medicine

## 2018-08-03 NOTE — Telephone Encounter (Signed)
08/03/18  pt called because she had questions about appts she had

## 2018-08-06 ENCOUNTER — Ambulatory Visit (HOSPITAL_COMMUNITY): Payer: Medicare Other | Admitting: Physical Therapy

## 2018-08-07 ENCOUNTER — Ambulatory Visit (HOSPITAL_COMMUNITY): Payer: Medicare Other | Admitting: Physical Therapy

## 2018-08-07 DIAGNOSIS — R293 Abnormal posture: Secondary | ICD-10-CM

## 2018-08-07 DIAGNOSIS — M5412 Radiculopathy, cervical region: Secondary | ICD-10-CM

## 2018-08-07 NOTE — Therapy (Addendum)
[facility] [address] [place], [place], [address] Phone: [phone]   Fax:  [phone]  Physical Therapy Treatment  Patient Details  Name: Kerry Walters MRN: [id number] Date of Birth: 1945-12-24 Referring Provider (PT): Odette Fraction   Encounter Date: 08/07/2018    Past Medical History:  Diagnosis Date  . Asthma due to environmental allergies    no inhaler  . Diverticulosis of colon   . GERD (gastroesophageal reflux disease)   . History of esophageal dilatation    FOR STRICTURE IN 2005  . History of hiatal hernia   . History of hypercalcemia    SECONDARY TO HYPERPARATHYROID  . History of hyperparathyroidism    PRIMARY--  S/P RIGHT PARATHYROIDECTOMY  . History of kidney stones   . History of recurrent UTIs   . Hyperlipidemia   . Hypertension   . Hypothyroidism   . Incomplete right bundle branch block   . Nephrolithiasis    RIGHT   . Nocturia   . PONV (postoperative nausea and vomiting)   . Pre-diabetes   . Sciatica of right side     Past Surgical History:  Procedure Laterality Date  . APPENDECTOMY  1974  . CYSTOSCOPY WITH URETEROSCOPY AND STENT PLACEMENT Right 11/11/2014   Procedure: CYSTOSCOPY WITH URETEROSCOPY AND STENT PLACEMENT;  Surgeon: Crist Fat, MD;  Location[facility];  Service: Urology;  Laterality: Right;  . EXCISION URETHRAL CARUNCLE  1981   Open procedure  . KNEE ARTHROSCOPY Left april 2010  . REMOVAL RIP  1973   First Rib on Right side-- for impingement  . RIGHT INFERIOR PARATHYROIDECTOMY, MINIMALLY INVASIVE  08-14-2008  . TONSILLECTOMY  as child  . TOTAL KNEE ARTHROPLASTY Left 11-17-2009  . TUBAL LIGATION  1973  . VAGINAL HYSTERECTOMY  1995   w/ Bilateral Salpingoophorectomy    There were no vitals filed for this visit. PT End of Session - 08/07/18 1528    Visit Number  8   Number of Visits  12    Date for PT Re-Evaluation  08/12/18    Authorization Type  UHC  MEdicare     Authorization Time Period  11/19-->08/12/2018    Authorization - Visit Number  8   Authorization - Number of Visits  10    PT Start Time  1515   PT Stop Time  1600   PT Time Calculation (min)  45 min    Activity Tolerance  Patient tolerated treatment well    Behavior During Therapy  [facility] for tasks assessed/performed       Subjective Assessment - 08/07/18 1612    Subjective  PT states she is not having a good day today.  Currently 4/10 pain in her LT upper trap but no radiculopathy.                       [facility] Adult PT Treatment/Exercise - 08/07/18 0001      Exercises   Exercises  Neck      Neck Exercises: Theraband   Rows  10 reps;Red      Neck Exercises: Seated   Cervical Isometrics  Extension;Right lateral flexion;Left lateral flexion;3 secs;5 reps    Cervical Isometrics Limitations  3-5" holds each direciotn    Neck Retraction  10 reps    Neck Retraction Limitations  chin tuck    Cervical Rotation  10 reps    Cervical Rotation Limitations  with chin tucks    Lateral  Flexion  Left;5 reps    Other Seated Exercise  3D cervical excursions 10x each slow and controlled movements (pain and limited mobility with Bil side bend and rotation)      Neck Exercises: Supine   Cervical Isometrics  Extension;Right lateral flexion;Left lateral flexion;3 secs;5 reps    Neck Retraction  10 reps      Manual Therapy   Manual Therapy  Soft tissue mobilization;Myofascial release;Passive ROM    Manual therapy comments  done seperate from all other aspects of treatment.     Soft tissue mobilization  LT trap to trigger points.                 PT Short Term Goals - 07/17/18 1401      PT SHORT TERM GOAL #1   Title  PT rotation to improve to 50 degrees B to improve safety while driving.     Time  2    Period  Weeks    Status  On-going      PT SHORT TERM GOAL #2   Title  PT pain to be radiating no further than her shoulder area to demonstrate  decreased nerve root irritation.     Time  2    Period  Weeks    Status  On-going      PT SHORT TERM GOAL #3   Title  Pt to be able to use LT hand for light activity like holding dishes while drying, folding clothes without increased pain.     Time  2    Period  Weeks    Status  On-going        PT Long Term Goals - 07/17/18 1402      PT LONG TERM GOAL #1   Title  PT cervical rotation to be 60 degrees B for safer driving.     Time  4    Period  Weeks    Status  On-going      PT LONG TERM GOAL #2   Title  PT pain level in her neck and Lt arm to be no greater than a 4/10 to allow pt to return to yard work activity.     Time  4    Period  Weeks    Status  On-going      PT LONG TERM GOAL #3   Title  Pt to no longer be having radicular sx to demonstrate decreased nerve root irritation.     Time  4    Period  Weeks    Status  On-going            Plan - 08/07/18 1610    Clinical Impression Statement  continued to address cervical mobility and postural strengthening.  Pt required minimal cues today to complete all therex.  Began rows with theraband to increase strength in scapular region.  Manual completed to bilateral UT and cervical mm with several mm spasms bilaterally.  Able to nearly resolve all spasms with noted improvment in pain and less restrictions in movement following.     Rehab Potential  Fair    PT Frequency  3x / week    PT Duration  4 weeks    PT Treatment/Interventions  ADLs/Self Care Home Management;Therapeutic activities;Therapeutic exercise;Patient/family education;Manual techniques;Passive range of motion;Joint Manipulations    PT Next Visit Plan  Continue with manual and cervical stability exercises. Continue sitting isometrics, ROM but do not complete if pt has any radicular sx     PT  Home Exercise Plan  HEP:  cervical and scapular retraction,  sitting tall, Lt SB, and extension.        Patient will benefit from skilled therapeutic intervention in  order to improve the following deficits and impairments:  Decreased activity tolerance, Decreased range of motion, Decreased strength, Hypomobility, Increased fascial restricitons, Increased muscle spasms, Impaired UE functional use, Obesity, Pain  Visit Diagnosis: No diagnosis found.     Problem List Patient Active Problem List   Diagnosis Date Noted  . Personal history of kidney stones 02/23/2015  . Atherosclerosis 02/23/2015  . Hx of low back pain 05/06/2014  . Encounter for preventive health examination 05/06/2014  . BMI 32.0-32.9,adult 11/05/2013  . Throat clearing 05/03/2013  . Nocturnal leg cramps 10/31/2012  . Low back pain 12/27/2011  . Pre-diabetes 09/27/2011  . Menopausal hot flushes 04/12/2011  . Knee pain 11/22/2010  . OBESITY 12/23/2008  . NEPHROLITHIASIS, HX OF 03/13/2008  . Hypothyroidism 06/29/2007  . HYPERLIPIDEMIA 06/29/2007  . Essential hypertension 06/29/2007  . ALLERGIC RHINITIS 06/29/2007  . GERD 06/29/2007  . POSTMENOPAUSAL STATUS 06/29/2007  . LEG PAIN, CHRONIC 06/29/2007  . HYPERGLYCEMIA, FASTING 06/29/2007   Lurena Nida B , PTA/CLT [phone]  Lurena Nida,  B 08/07/2018, 4:12 PM  [facility] [address] , [place], [address] Phone: [phone]   Fax:  [phone]  Name: Kerry Walters MRN: [id number] Date of Birth: July 09, 1946

## 2018-08-08 ENCOUNTER — Ambulatory Visit (HOSPITAL_COMMUNITY): Payer: Medicare Other | Admitting: Physical Therapy

## 2018-08-08 DIAGNOSIS — M5412 Radiculopathy, cervical region: Secondary | ICD-10-CM

## 2018-08-08 DIAGNOSIS — R293 Abnormal posture: Secondary | ICD-10-CM | POA: Diagnosis not present

## 2018-08-08 NOTE — Therapy (Signed)
[facility] [address] [place], [place], [address] Phone: [phone]   Fax:  [phone]  Physical Therapy Treatment  Patient Details  Name: Kerry Walters MRN: [id number] Date of Birth: 24-Jan-1946 Referring Provider (PT): Odette Fraction   Encounter Date: 08/08/2018  PT End of Session - 08/08/18 1422    Visit Number  8    Number of Visits  12    Date for PT Re-Evaluation  08/12/18    Authorization Type  [employer] MEdicare     Authorization Time Period  11/19-->08/12/2018    Authorization - Visit Number  8    Authorization - Number of Visits  10    PT Start Time  1320    PT Stop Time  1409    PT Time Calculation (min)  49 min    Activity Tolerance  Patient tolerated treatment well    Behavior During Therapy  [facility] for tasks assessed/performed       Past Medical History:  Diagnosis Date  . Asthma due to environmental allergies    no inhaler  . Diverticulosis of colon   . GERD (gastroesophageal reflux disease)   . History of esophageal dilatation    FOR STRICTURE IN 2005  . History of hiatal hernia   . History of hypercalcemia    SECONDARY TO HYPERPARATHYROID  . History of hyperparathyroidism    PRIMARY--  S/P RIGHT PARATHYROIDECTOMY  . History of kidney stones   . History of recurrent UTIs   . Hyperlipidemia   . Hypertension   . Hypothyroidism   . Incomplete right bundle branch block   . Nephrolithiasis    RIGHT   . Nocturia   . PONV (postoperative nausea and vomiting)   . Pre-diabetes   . Sciatica of right side     Past Surgical History:  Procedure Laterality Date  . APPENDECTOMY  1974  . CYSTOSCOPY WITH URETEROSCOPY AND STENT PLACEMENT Right 11/11/2014   Procedure: CYSTOSCOPY WITH URETEROSCOPY AND STENT PLACEMENT;  Surgeon: Crist Fat, MD;  Location: [facility];  Service: Urology;  Laterality: Right;  . EXCISION URETHRAL CARUNCLE  1981   Open procedure  . KNEE ARTHROSCOPY Left april 2010  .  REMOVAL RIP  1973   First Rib on Right side-- for impingement  . RIGHT INFERIOR PARATHYROIDECTOMY, MINIMALLY INVASIVE  08-14-2008  . TONSILLECTOMY  as child  . TOTAL KNEE ARTHROPLASTY Left 11-17-2009  . TUBAL LIGATION  1973  . VAGINAL HYSTERECTOMY  1995   w/ Bilateral Salpingoophorectomy    There were no vitals filed for this visit.  Subjective Assessment - 08/08/18 1321    Subjective  Kerry Walters states that she it terribly sore from yesterday treatment.    Pertinent History  TKR,     Limitations  House hold activities;Sitting;Lifting    How long can you sit comfortably?  able to sit for varied time but no longer than a couple of hours was an hour     Patient Stated Goals  PT is waking up 3-4 x a night in pain and having difficulty sleeping     Currently in Pain?  No/denies   soreness   Pain Onset  More than a month ago             [facility] Adult PT Treatment/Exercise - 08/08/18 0001      Exercises   Exercises  Neck      Neck Exercises: Theraband   Scapula Retraction  10 reps;Red  Shoulder Extension  10 reps;Red    Rows  10 reps;Red      Neck Exercises: Seated   Cervical Rotation  10 reps    Cervical Rotation Limitations  with chin tucks    Lateral Flexion  Left;5 reps    W Back  10 reps    Shoulder Shrugs  10 reps    Shoulder Rolls  Backwards;10 reps      Neck Exercises: Supine   Cervical Isometrics  Extension;Right lateral flexion;Left lateral flexion;3 secs;5 reps    Neck Retraction  10 reps    Other Supine Exercise  scapular retraction x 10       Modalities   Modalities  Moist Heat      Moist Heat Therapy   Number Minutes Moist Heat  15 Minutes    Moist Heat Location  --   B upper trapezius      Manual Therapy   Manual Therapy  Soft tissue mobilization;Myofascial release;Passive ROM    Manual therapy comments  done seperate from all other aspects of treatment.     Soft tissue mobilization  LT trap to trigger points.      Myofascial Release   suboccipital release    Manual Traction  5x30"               PT Short Term Goals - 07/17/18 1401      PT SHORT TERM GOAL #1   Title  PT rotation to improve to 50 degrees B to improve safety while driving.     Time  2    Period  Weeks    Status  On-going      PT SHORT TERM GOAL #2   Title  PT pain to be radiating no further than her shoulder area to demonstrate decreased nerve root irritation.     Time  2    Period  Weeks    Status  On-going      PT SHORT TERM GOAL #3   Title  Pt to be able to use LT hand for light activity like holding dishes while drying, folding clothes without increased pain.     Time  2    Period  Weeks    Status  On-going        PT Long Term Goals - 07/17/18 1402      PT LONG TERM GOAL #1   Title  PT cervical rotation to be 60 degrees B for safer driving.     Time  4    Period  Weeks    Status  On-going      PT LONG TERM GOAL #2   Title  PT pain level in her neck and Lt arm to be no greater than a 4/10 to allow pt to return to yard work activity.     Time  4    Period  Weeks    Status  On-going      PT LONG TERM GOAL #3   Title  Pt to no longer be having radicular sx to demonstrate decreased nerve root irritation.     Time  4    Period  Weeks    Status  On-going            Plan - 08/08/18 1423    Clinical Impression Statement  Pt tender to superfical touch today therefore manual focused on cervical traction and suboccipital release with soft manual mm techniques.  PT progressed with cervical stabilization with the additon of  standin t-band rows and extension.     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 Continue sitting isometrics, ROM but do not complete if pt has any radicular sx     PT Home Exercise Plan  HEP:  cervical and scapular retraction,  sitting tall, Lt SB, and extension.        Patient will benefit from skilled therapeutic intervention in order to improve the following deficits and impairments:  Decreased activity tolerance, Decreased range of motion, Decreased strength, Hypomobility, Increased fascial restricitons, Increased muscle spasms, Impaired UE functional use, Obesity, Pain  Visit Diagnosis: Radiculopathy, cervical region  Abnormal posture     Problem List Patient Active Problem List   Diagnosis Date Noted  . Personal history of kidney stones 02/23/2015  . Atherosclerosis 02/23/2015  . Hx of low back pain 05/06/2014  . Encounter for preventive health examination 05/06/2014  . BMI 32.0-32.9,adult 11/05/2013  . Throat clearing 05/03/2013  . Nocturnal leg cramps 10/31/2012  . Low back pain 12/27/2011  . Pre-diabetes 09/27/2011  . Menopausal hot flushes 04/12/2011  . Knee pain 11/22/2010  . OBESITY 12/23/2008  . NEPHROLITHIASIS, HX OF 03/13/2008  . Hypothyroidism 06/29/2007  . HYPERLIPIDEMIA 06/29/2007  . Essential hypertension 06/29/2007  . ALLERGIC RHINITIS 06/29/2007  . GERD 06/29/2007  . POSTMENOPAUSAL STATUS 06/29/2007  . LEG PAIN, CHRONIC 06/29/2007  . HYPERGLYCEMIA, FASTING 06/29/2007    Virgina Organ, PT CLT [phone] 08/08/2018, 2:26 PM  [facility] [address] [place], [place], [address] Phone: [phone]   Fax:  [phone]  Name: Kerry Walters MRN: [id number] Date of Birth: [birth year]

## 2018-08-09 ENCOUNTER — Encounter (HOSPITAL_COMMUNITY): Payer: Medicare Other | Admitting: Physical Therapy

## 2018-08-09 DIAGNOSIS — M47812 Spondylosis without myelopathy or radiculopathy, cervical region: Secondary | ICD-10-CM | POA: Diagnosis not present

## 2018-08-09 DIAGNOSIS — I10 Essential (primary) hypertension: Secondary | ICD-10-CM | POA: Diagnosis not present

## 2018-08-13 ENCOUNTER — Telehealth (HOSPITAL_COMMUNITY): Payer: Self-pay | Admitting: Internal Medicine

## 2018-08-13 ENCOUNTER — Ambulatory Visit (HOSPITAL_COMMUNITY): Payer: Medicare Other

## 2018-08-13 NOTE — Telephone Encounter (Signed)
08/13/18  10:26 left her a message to let know that therapist was out sick and at the momet had no where to put her but she brings her son to therapy and when she got here if a 1:45 new patient didn't show w could move into that time slot on [facility] schedule.[name]..[name]

## 2018-08-15 ENCOUNTER — Ambulatory Visit (HOSPITAL_COMMUNITY): Payer: Medicare Other | Admitting: Physical Therapy

## 2018-08-15 DIAGNOSIS — M5412 Radiculopathy, cervical region: Secondary | ICD-10-CM

## 2018-08-15 DIAGNOSIS — R293 Abnormal posture: Secondary | ICD-10-CM

## 2018-08-15 NOTE — Therapy (Addendum)
[facility] [address] [place], [place], [address] Phone: [phone]   Fax:  [phone]  Physical Therapy Treatment  Patient Details  Name: Kerry Walters MRN: [id number] Date of Birth: [birth year] Referring Provider (PT): Odette Fraction   Encounter Date: 08/15/2018    PT End of Session - 08/15/18 1528    Visit Number  10  (progress note completed visit 4)    Number of Visits  12    Date for PT Re-Evaluation  08/12/18    Authorization Type  [employer] MEdicare     Authorization Time Period  69/62-->95/28/4132  Cert until January redone   Authorization - Visit Number  10   Authorization - Number of Visits  14    PT Start Time  1300   PT Stop Time  1345   PT Time Calculation (min)  45 min    Activity Tolerance  Patient tolerated treatment well    Behavior During Therapy  [facility] for tasks assessed/performed        Past Medical History:  Diagnosis Date  . Asthma due to environmental allergies    no inhaler  . Diverticulosis of colon   . GERD (gastroesophageal reflux disease)   . History of esophageal dilatation    FOR STRICTURE IN 2005  . History of hiatal hernia   . History of hypercalcemia    SECONDARY TO HYPERPARATHYROID  . History of hyperparathyroidism    PRIMARY--  S/P RIGHT PARATHYROIDECTOMY  . History of kidney stones   . History of recurrent UTIs   . Hyperlipidemia   . Hypertension   . Hypothyroidism   . Incomplete right bundle branch block   . Nephrolithiasis    RIGHT   . Nocturia   . PONV (postoperative nausea and vomiting)   . Pre-diabetes   . Sciatica of right side     Past Surgical History:  Procedure Laterality Date  . APPENDECTOMY  1974  . CYSTOSCOPY WITH URETEROSCOPY AND STENT PLACEMENT Right 11/11/2014   Procedure: CYSTOSCOPY WITH URETEROSCOPY AND STENT PLACEMENT;  Surgeon: Crist Fat, MD;  Location: [facility];  Service: Urology;  Laterality: Right;  . EXCISION  URETHRAL CARUNCLE  1981   Open procedure  . KNEE ARTHROSCOPY Left april 2010  . REMOVAL RIP  1973   First Rib on Right side-- for impingement  . RIGHT INFERIOR PARATHYROIDECTOMY, MINIMALLY INVASIVE  08-14-2008  . TONSILLECTOMY  as child  . TOTAL KNEE ARTHROPLASTY Left 11-17-2009  . TUBAL LIGATION  1973  . VAGINAL HYSTERECTOMY  1995   w/ Bilateral Salpingoophorectomy    There were no vitals filed for this visit.  Subjective Assessment - 08/15/18 1305    Subjective  Pt states she is a little better. Currently 4/10.      Currently in Pain?  Yes    Pain Score  4     Pain Location  Neck    Pain Orientation  Left    Pain Descriptors / Indicators  Aching                       [facility] Adult PT Treatment/Exercise - 08/15/18 0001      Neck Exercises: Theraband   Scapula Retraction  10 reps;Red    Shoulder Extension  10 reps;Red    Rows  10 reps;Red      Neck Exercises: Seated   Cervical Rotation  10 reps    Cervical Rotation Limitations  with chin  tucks    Lateral Flexion  Left;5 reps    W Back  10 reps    Shoulder Shrugs  10 reps    Shoulder Rolls  Backwards;10 reps      Modalities   Modalities  Moist Heat      Moist Heat Therapy   Number Minutes Moist Heat  5 Minutes    Moist Heat Location  Cervical   bil traps in supine with manual     Manual Therapy   Manual Therapy  Soft tissue mobilization;Myofascial release;Passive ROM    Manual therapy comments  done seperate from all other aspects of treatment.     Soft tissue mobilization  bil trap to trigger points seated      Myofascial Release  suboccipital release in supine with MHP    Manual Traction  5x30" with MHP               PT Short Term Goals - 07/17/18 1401      PT SHORT TERM GOAL #1   Title  PT rotation to improve to 50 degrees B to improve safety while driving.     Time  2    Period  Weeks    Status  On-going      PT SHORT TERM GOAL #2   Title  PT pain to be radiating no further  than her shoulder area to demonstrate decreased nerve root irritation.     Time  2    Period  Weeks    Status  On-going      PT SHORT TERM GOAL #3   Title  Pt to be able to use LT hand for light activity like holding dishes while drying, folding clothes without increased pain.     Time  2    Period  Weeks    Status  On-going        PT Long Term Goals - 07/17/18 1402      PT LONG TERM GOAL #1   Title  PT cervical rotation to be 60 degrees B for safer driving.     Time  4    Period  Weeks    Status  On-going      PT LONG TERM GOAL #2   Title  PT pain level in her neck and Lt arm to be no greater than a 4/10 to allow pt to return to yard work activity.     Time  4    Period  Weeks    Status  On-going      PT LONG TERM GOAL #3   Title  Pt to no longer be having radicular sx to demonstrate decreased nerve root irritation.     Time  4    Period  Weeks    Status  On-going            Plan - 08/15/18 1431    Clinical Impression Statement  PT with general pain today while completing scapular retraction using theraband, having to lower her UE to make it more comfortable. also instructed to do isometrics against the wall or supine to use for resistance rather than UE as more comfortable for patient.   Increased reps of therex without diffiuclty.  Pt with less tenderness tdoay with manual.  Had patient use moist heat on upper traps while completing sub occiptial release and cervical traction prior to manual on upper traps in seated positon. continued trigger point in Lt UT today and several spasms in Rt  UT as well.  Able to reduce these 80% by end of sesson with overal improvement in pain.[name] No pain at end of session.    Rehab Potential  Fair    PT Frequency  3x / week    PT Duration  4 weeks    PT Treatment/Interventions  ADLs/Self Care Home Management;Therapeutic activities;Therapeutic exercise;Patient/family education;Manual techniques;Passive range of motion;Joint Manipulations     PT Next Visit Plan  Continue with manual and cervical stability exercises. Continue sitting isometrics, ROM but do not complete if pt has any radicular sx     PT Home Exercise Plan  HEP:  cervical and scapular retraction,  sitting tall, Lt SB, and extension.        Patient will benefit from skilled therapeutic intervention in order to improve the following deficits and impairments:  Decreased activity tolerance, Decreased range of motion, Decreased strength, Hypomobility, Increased fascial restricitons, Increased muscle spasms, Impaired UE functional use, Obesity, Pain  Visit Diagnosis: Radiculopathy, cervical region  Abnormal posture     Problem List Patient Active Problem List   Diagnosis Date Noted  . Personal history of kidney stones 02/23/2015  . Atherosclerosis 02/23/2015  . Hx of low back pain 05/06/2014  . Encounter for preventive health examination 05/06/2014  . BMI 32.0-32.9,adult 11/05/2013  . Throat clearing 05/03/2013  . Nocturnal leg cramps 10/31/2012  . Low back pain 12/27/2011  . Pre-diabetes 09/27/2011  . Menopausal hot flushes 04/12/2011  . Knee pain 11/22/2010  . OBESITY 12/23/2008  . NEPHROLITHIASIS, HX OF 03/13/2008  . Hypothyroidism 06/29/2007  . HYPERLIPIDEMIA 06/29/2007  . Essential hypertension 06/29/2007  . ALLERGIC RHINITIS 06/29/2007  . GERD 06/29/2007  . POSTMENOPAUSAL STATUS 06/29/2007  . LEG PAIN, CHRONIC 06/29/2007  . HYPERGLYCEMIA, FASTING 06/29/2007   Lurena Nida, PTA/CLT [phone]  Lurena Nida 08/15/2018, 2:43 PM  [facility] [address] [place], [place], [address] Phone: [phone]   Fax:  [phone]  Name: Angelys Yetman MRN: [id number] Date of Birth: Jun 14, 1946

## 2018-08-29 NOTE — Addendum Note (Signed)
Addended by: Bella KennedyUSSELL,  J on: 08/29/2018 11:16 AM   Modules accepted: Orders

## 2018-08-30 ENCOUNTER — Ambulatory Visit (HOSPITAL_COMMUNITY): Payer: Medicare Other | Attending: Anesthesiology

## 2018-08-30 ENCOUNTER — Encounter (HOSPITAL_COMMUNITY): Payer: Self-pay

## 2018-08-30 ENCOUNTER — Telehealth (HOSPITAL_COMMUNITY): Payer: Self-pay

## 2018-08-30 DIAGNOSIS — R293 Abnormal posture: Secondary | ICD-10-CM | POA: Insufficient documentation

## 2018-08-30 DIAGNOSIS — M5412 Radiculopathy, cervical region: Secondary | ICD-10-CM | POA: Insufficient documentation

## 2018-08-30 NOTE — Therapy (Signed)
[facility] [address] [place], [place], [address] Phone: [phone]   Fax:  [phone]  Physical Therapy Treatment  Patient Details  Name: Kerry GrovesMary Sue Walters MRN: [id number] Date of Birth: [birth year] Referring Provider (PT): Odette FractionPaul Harkins   Encounter Date: 08/30/2018  PT End of Session - 08/30/18 1445    Visit Number  9    Number of Visits  12    Date for PT Re-Evaluation  09/27/18    Authorization Type  [employer] MEdicare     Authorization Time Period  08/15/18-->09/27/18    Authorization - Visit Number  9    Authorization - Number of Visits  10    PT Start Time  1433    PT Stop Time  1520    PT Time Calculation (min)  47 min    Activity Tolerance  Patient tolerated treatment well    Behavior During Therapy  [facility] for tasks assessed/performed       Past Medical History:  Diagnosis Date  . Asthma due to environmental allergies    no inhaler  . Diverticulosis of colon   . GERD (gastroesophageal reflux disease)   . History of esophageal dilatation    FOR STRICTURE IN 2005  . History of hiatal hernia   . History of hypercalcemia    SECONDARY TO HYPERPARATHYROID  . History of hyperparathyroidism    PRIMARY--  S/P RIGHT PARATHYROIDECTOMY  . History of kidney stones   . History of recurrent UTIs   . Hyperlipidemia   . Hypertension   . Hypothyroidism   . Incomplete right bundle branch block   . Nephrolithiasis    RIGHT   . Nocturia   . PONV (postoperative nausea and vomiting)   . Pre-diabetes   . Sciatica of right side     Past Surgical History:  Procedure Laterality Date  . APPENDECTOMY  1974  . CYSTOSCOPY WITH URETEROSCOPY AND STENT PLACEMENT Right 11/11/2014   Procedure: CYSTOSCOPY WITH URETEROSCOPY AND STENT PLACEMENT;  Surgeon: Crist FatBenjamin W Herrick, MD;  [facility] ;  Service: Urology;  Laterality: Right;  . EXCISION URETHRAL CARUNCLE  1981   Open procedure  . KNEE ARTHROSCOPY Left april 2010  .  REMOVAL RIP  1973   First Rib on Right side-- for impingement  . RIGHT INFERIOR PARATHYROIDECTOMY, MINIMALLY INVASIVE  08-14-2008  . TONSILLECTOMY  as child  . TOTAL KNEE ARTHROPLASTY Left 11-17-2009  . TUBAL LIGATION  1973  . VAGINAL HYSTERECTOMY  1995   w/ Bilateral Salpingoophorectomy    There were no vitals filed for this visit.  Subjective Assessment - 08/30/18 1436    Subjective  Pt reports she does have some pain and radicular symptoms on Lt side of neck into Lt shoulder.    Pertinent History  TKR,     Patient Stated Goals  PT is waking up 3-4 x a night in pain and having difficulty sleeping     Currently in Pain?  Yes    Pain Score  3     Pain Location  Neck    Pain Orientation  Left    Pain Descriptors / Indicators  Sharp    Pain Type  Chronic pain    Pain Radiating Towards  Lt shoulder    Pain Onset  More than a month ago    Pain Frequency  Constant    Aggravating Factors   working     Pain Relieving Factors  shot    Effect  of Pain on Daily Activities  limits                       [facility] Adult PT Treatment/Exercise - 08/30/18 0001      Neck Exercises: Theraband   Shoulder Extension  10 reps;Red    Rows  10 reps;Red      Neck Exercises: Standing   Neck Retraction  10 reps;3 secs    Other Standing Exercises  Isometrics sidebend, flexion and extend 10x 5"      Neck Exercises: Supine   Neck Retraction  10 reps;3 secs    Neck Retraction Limitations  2 sets      Modalities   Modalities  Moist Heat      Moist Heat Therapy   Number Minutes Moist Heat  5 Minutes    Moist Heat Location  Cervical   Bil upper traps prior manual     Manual Therapy   Manual Therapy  Soft tissue mobilization;Myofascial release;Passive ROM    Manual therapy comments  done seperate from all other aspects of treatment.     Soft tissue mobilization  bil trap to trigger points prone     Myofascial Release  suboccipital release in supine with MHP    Manual Traction  5x30"  with MHP               PT Short Term Goals - 07/17/18 1401      PT SHORT TERM GOAL #1   Title  PT rotation to improve to 50 degrees B to improve safety while driving.     Time  2    Period  Weeks    Status  On-going      PT SHORT TERM GOAL #2   Title  PT pain to be radiating no further than her shoulder area to demonstrate decreased nerve root irritation.     Time  2    Period  Weeks    Status  On-going      PT SHORT TERM GOAL #3   Title  Pt to be able to use LT hand for light activity like holding dishes while drying, folding clothes without increased pain.     Time  2    Period  Weeks    Status  On-going        PT Long Term Goals - 07/17/18 1402      PT LONG TERM GOAL #1   Title  PT cervical rotation to be 60 degrees B for safer driving.     Time  4    Period  Weeks    Status  On-going      PT LONG TERM GOAL #2   Title  PT pain level in her neck and Lt arm to be no greater than a 4/10 to allow pt to return to yard work activity.     Time  4    Period  Weeks    Status  On-going      PT LONG TERM GOAL #3   Title  Pt to no longer be having radicular sx to demonstrate decreased nerve root irritation.     Time  4    Period  Weeks    Status  On-going            Plan - 08/30/18 1532    Clinical Impression Statement  Pt arrived with reports of radicular symptoms ending Lt shoulder.  Began session wiht isometric strengthening therex to assist  with cervical stability and reduce radicular symptoms.  Continued with postural strengthening exercises with good form.  Pt with increased tenderness with palpation to Lt suboccipitial lobe and reports of headache, able to resolve headache and radicular symptoms with prolonged suboccipitial release, traction and soft tissue mobilization to Lt upper traps and levator.  No reports of pain at EOS.      Rehab Potential  Fair    PT Frequency  3x / week    PT Duration  4 weeks    PT Treatment/Interventions  ADLs/Self Care  Home Management;Therapeutic activities;Therapeutic exercise;Patient/family education;Manual techniques;Passive range of motion;Joint Manipulations    PT Next Visit Plan  Continue with manual and cervical stability exercises. Continue sitting isometrics, ROM but do not complete if pt has any radicular sx     PT Home Exercise Plan  HEP:  cervical and scapular retraction,  sitting tall, Lt SB, and extension.        Patient will benefit from skilled therapeutic intervention in order to improve the following deficits and impairments:  Decreased activity tolerance, Decreased range of motion, Decreased strength, Hypomobility, Increased fascial restricitons, Increased muscle spasms, Impaired UE functional use, Obesity, Pain  Visit Diagnosis: Radiculopathy, cervical region  Abnormal posture     Problem List Patient Active Problem List   Diagnosis Date Noted  . Personal history of kidney stones 02/23/2015  . Atherosclerosis 02/23/2015  . Hx of low back pain 05/06/2014  . Encounter for preventive health examination 05/06/2014  . BMI 32.0-32.9,adult 11/05/2013  . Throat clearing 05/03/2013  . Nocturnal leg cramps 10/31/2012  . Low back pain 12/27/2011  . Pre-diabetes 09/27/2011  . Menopausal hot flushes 04/12/2011  . Knee pain 11/22/2010  . OBESITY 12/23/2008  . NEPHROLITHIASIS, HX OF 03/13/2008  . Hypothyroidism 06/29/2007  . HYPERLIPIDEMIA 06/29/2007  . Essential hypertension 06/29/2007  . ALLERGIC RHINITIS 06/29/2007  . GERD 06/29/2007  . POSTMENOPAUSAL STATUS 06/29/2007  . LEG PAIN, CHRONIC 06/29/2007  . HYPERGLYCEMIA, FASTING 06/29/2007   Becky Sax, LPTA; CBIS [phone]  Juel Burrow 08/30/2018, 3:37 PM  [facility] [address] [place], [place], [address] Phone: [phone]   Fax:  [phone]  Name: Anselma Morishita MRN: [id number] Date of Birth: 1945/12/19

## 2018-08-30 NOTE — Telephone Encounter (Signed)
Pt approved moving to 1:45  on 09/10/18--as far as she knows, did not have calendar with her today. NF 09/30/18.

## 2018-09-03 ENCOUNTER — Telehealth (HOSPITAL_COMMUNITY): Payer: Self-pay | Admitting: Physical Therapy

## 2018-09-03 ENCOUNTER — Ambulatory Visit (HOSPITAL_COMMUNITY): Payer: Medicare Other | Admitting: Physical Therapy

## 2018-09-03 NOTE — Telephone Encounter (Signed)
She can not keep this apptment  today-rs/ for Wed and could not discuss the reason why right now.

## 2018-09-05 ENCOUNTER — Ambulatory Visit (HOSPITAL_COMMUNITY): Payer: Medicare Other | Admitting: Physical Therapy

## 2018-09-05 DIAGNOSIS — R293 Abnormal posture: Secondary | ICD-10-CM | POA: Diagnosis not present

## 2018-09-05 DIAGNOSIS — M5412 Radiculopathy, cervical region: Secondary | ICD-10-CM

## 2018-09-05 NOTE — Therapy (Signed)
[facility] [address] [place], [place], [address] Phone: [phone]   Fax:  [phone]  Physical Therapy Treatment  Patient Details  Name: Kerry Walters MRN: [id number] Date of Birth: [birth year] Referring Provider (PT): Odette Fraction   Encounter Date: 09/05/2018  PT End of Session - 09/05/18 1508    Visit Number  12   progress note completed visit 4   Number of Visits  14    Date for PT Re-Evaluation  09/27/18    Authorization Type  [employer] MEd[employer]     Authorization Time Period  08/15/18-->09/27/18    Authorization - Visit Number  12    Authorization - Number of Visits  14    PT Start Time  1355    PT Stop Time  1445    PT Time Calculation (min)  50 min    Activity Tolerance  Patient tolerated treatment well    Behavior During Therapy  [facility] for tasks assessed/performed       Past Medical History:  Diagnosis Date  . Asthma due to environmental allergies    no inhaler  . Diverticulosis of colon   . GERD (gastroesophageal reflux disease)   . History of esophageal dilatation    FOR STRICTURE IN 2005  . History of hiatal hernia   . History of hypercalcemia    SECONDARY TO HYPERPARATHYROID  . History of hyperparathyroidism    PRIMARY--  S/P RIGHT PARATHYROIDECTOMY  . History of kidney stones   . History of recurrent UTIs   . Hyperlipidemia   . Hypertension   . Hypothyroidism   . Incomplete right bundle branch block   . Nephrolithiasis    RIGHT   . Nocturia   . PONV (postoperative nausea and vomiting)   . Pre-diabetes   . Sciatica of right side     Past Surgical History:  Procedure Laterality Date  . APPENDECTOMY  1974  . CYSTOSCOPY WITH URETEROSCOPY AND STENT PLACEMENT Right 11/11/2014   Procedure: CYSTOSCOPY WITH URETEROSCOPY AND STENT PLACEMENT;  Surgeon: Crist Fat, MD;  Location: [facility];  Service: Urology;  Laterality: Right;  . EXCISION URETHRAL CARUNCLE  1981   Open procedure  . KNEE  ARTHROSCOPY Left april 2010  . REMOVAL RIP  1973   First Rib on Right side-- for impingement  . RIGHT INFERIOR PARATHYROIDECTOMY, MINIMALLY INVASIVE  08-14-2008  . TONSILLECTOMY  as child  . TOTAL KNEE ARTHROPLASTY Left 11-17-2009  . TUBAL LIGATION  1973  . VAGINAL HYSTERECTOMY  1995   w/ Bilateral Salpingoophorectomy    There were no vitals filed for this visit.  Subjective Assessment - 09/05/18 1356    Subjective  Pt states her pain is increased to 6/10 today, radiating into Lt shoulder    Currently in Pain?  Yes    Pain Score  6     Pain Location  Neck    Pain Orientation  Left         [facility] PT Assessment - 09/05/18 0001      Observation/Other Assessments   Focus on Therapeutic Outcomes (FOTO)   Functional limitation 59% (was 48)                   [facility] Adult PT Treatment/Exercise - 09/05/18 0001      Neck Exercises: Seated   Cervical Rotation  10 reps    Cervical Rotation Limitations  with chin tucks    Lateral Flexion  Left;5 reps  W Back  10 reps      Modalities   Modalities  Moist Heat      Moist Heat Therapy   Number Minutes Moist Heat  5 Minutes    Moist Heat Location  Cervical      Manual Therapy   Manual Therapy  Soft tissue mobilization;Myofascial release;Passive ROM    Manual therapy comments  done seperate from all other aspects of treatment.     Soft tissue mobilization  bil trap to trigger points prone     Myofascial Release  suboccipital release in supine with MHP    Manual Traction  5x30" with MHP               PT Short Term Goals - 07/17/18 1401      PT SHORT TERM GOAL #1   Title  PT rotation to improve to 50 degrees B to improve safety while driving.     Time  2    Period  Weeks    Status  On-going      PT SHORT TERM GOAL #2   Title  PT pain to be radiating no further than her shoulder area to demonstrate decreased nerve root irritation.     Time  2    Period  Weeks    Status  On-going      PT SHORT TERM GOAL  #3   Title  Pt to be able to use LT hand for light activity like holding dishes while drying, folding clothes without increased pain.     Time  2    Period  Weeks    Status  On-going        PT Long Term Goals - 07/17/18 1402      PT LONG TERM GOAL #1   Title  PT cervical rotation to be 60 degrees B for safer driving.     Time  4    Period  Weeks    Status  On-going      PT LONG TERM GOAL #2   Title  PT pain level in her neck and Lt arm to be no greater than a 4/10 to allow pt to return to yard work activity.     Time  4    Period  Weeks    Status  On-going      PT LONG TERM GOAL #3   Title  Pt to no longer be having radicular sx to demonstrate decreased nerve root irritation.     Time  4    Period  Weeks    Status  On-going            Plan - 09/05/18 1509    Clinical Impression Statement  Radicular symptoms still high today with minimal AROM complete and remainder manual to help reduce symtpoms.  Pt complete FOTO this session with 10 point decline noted rather than improvement.  Returns to MD this week and will proceed according to further instructions.  Pt reported overall improvement at end of session following manual.  Large spasm in Lt UT reduced but not resolved at end of session.      Rehab Potential  Fair    PT Frequency  3x / week    PT Duration  4 weeks    PT Treatment/Interventions  ADLs/Self Care Home Management;Therapeutic activities;Therapeutic exercise;Patient/family education;Manual techniques;Passive range of motion;Joint Manipulations    PT Next Visit Plan  Continue with manual and cervical stability exercises. Continue sitting isometrics, ROM but do  not complete if pt has any radicular sx.  Will need 10 visit progress note at visit #14.    PT Home Exercise Plan  HEP:  cervical and scapular retraction,  sitting tall, Lt SB, and extension.        Patient will benefit from skilled therapeutic intervention in order to improve the following deficits and  impairments:  Decreased activity tolerance, Decreased range of motion, Decreased strength, Hypomobility, Increased fascial restricitons, Increased muscle spasms, Impaired UE functional use, Obesity, Pain  Visit Diagnosis: Radiculopathy, cervical region  Abnormal posture     Problem List Patient Active Problem List   Diagnosis Date Noted  . Personal history of kidney stones 02/23/2015  . Atherosclerosis 02/23/2015  . Hx of low back pain 05/06/2014  . Encounter for preventive health examination 05/06/2014  . BMI 32.0-32.9,adult 11/05/2013  . Throat clearing 05/03/2013  . Nocturnal leg cramps 10/31/2012  . Low back pain 12/27/2011  . Pre-diabetes 09/27/2011  . Menopausal hot flushes 04/12/2011  . Knee pain 11/22/2010  . OBESITY 12/23/2008  . NEPHROLITHIASIS, HX OF 03/13/2008  . Hypothyroidism 06/29/2007  . HYPERLIPIDEMIA 06/29/2007  . Essential hypertension 06/29/2007  . ALLERGIC RHINITIS 06/29/2007  . GERD 06/29/2007  . POSTMENOPAUSAL STATUS 06/29/2007  . LEG PAIN, CHRONIC 06/29/2007  . HYPERGLYCEMIA, FASTING 06/29/2007   Lurena Nida B , PTA/CLT [phone]  Lurena Nida,  B 09/05/2018, 3:13 PM  [facility] [address] [place], [place], [address] Phone: [phone]   Fax:  [phone]  Name: Lucious GrovesMary Sue Bolz MRN: [id number] Date of Birth: 07/14/1946

## 2018-09-06 DIAGNOSIS — M47812 Spondylosis without myelopathy or radiculopathy, cervical region: Secondary | ICD-10-CM | POA: Diagnosis not present

## 2018-09-07 ENCOUNTER — Encounter (HOSPITAL_COMMUNITY): Payer: Self-pay

## 2018-09-07 ENCOUNTER — Ambulatory Visit (HOSPITAL_COMMUNITY): Payer: Medicare Other

## 2018-09-07 DIAGNOSIS — M5412 Radiculopathy, cervical region: Secondary | ICD-10-CM | POA: Diagnosis not present

## 2018-09-07 DIAGNOSIS — R293 Abnormal posture: Secondary | ICD-10-CM

## 2018-09-07 NOTE — Therapy (Signed)
[facility] [address] [place], [place], [address] Phone: [phone]   Fax:  [phone]  Physical Therapy Treatment  Patient Details  Name: Kerry Walters MRN: [id number] Date of Birth: [birth year] Referring Provider (PT): Odette FractionPaul Harkins   Encounter Date: 09/07/2018  PT End of Session - 09/07/18 1557    Visit Number  11   Progress note complete visit #4, FOTO complete #10   Number of Visits  14    Date for PT Re-Evaluation  09/27/18    Authorization Type  [employer] MEd[employer]     Authorization Time Period  08/15/18-->09/27/18    Authorization - Visit Number  11    Authorization - Number of Visits  14    PT Start Time  1517    PT Stop Time  1557    PT Time Calculation (min)  40 min    Activity Tolerance  Patient tolerated treatment well    Behavior During Therapy  [facility] for [facility] assessed/per[facility]       Past Medical History:  Diagnosis Date  . Asthma due to environmental allergies    no inhaler  . Diverticulosis of colon   . GERD (gastroesophageal reflux disease)   . History of esophageal dilatation    FOR STRICTURE IN 2005  . History of hiatal hernia   . History of hypercalcemia    SECONDARY TO HYPERPARATHYROID  . History of hyperparathyroidism    PRIMARY--  S/P RIGHT PARATHYROIDECTOMY  . History of kidney stones   . History of recurrent UTIs   . Hyperlipidemia   . Hypertension   . Hypothyroidism   . Incomplete right bundle branch block   . Nephrolithiasis    RIGHT   . Nocturia   . PONV (postoperative nausea and vomiting)   . Pre-diabetes   . Sciatica of right side     Past Surgical History:  Procedure Laterality Date  . APPENDECTOMY  1974  . CYSTOSCOPY WITH URETEROSCOPY AND STENT PLACEMENT Right 11/11/2014   Procedure: CYSTOSCOPY WITH URETEROSCOPY AND STENT PLACEMENT;  Surgeon: Crist FatBenjamin W Herrick, MD;  [facility];  Service: Urology;  Laterality: Right;  . EXCISION URETHRAL CARUNCLE  1981   Open procedure  . KNEE ARTHROSCOPY Left april 2010  . REMOVAL RIP  1973   First Rib on Right side-- for impingement  . RIGHT INFERIOR PARATHYROIDECTOMY, MINIMALLY INVASIVE  08-14-2008  . TONSILLECTOMY  as child  . TOTAL KNEE ARTHROPLASTY Left 11-17-2009  . TUBAL LIGATION  1973  . VAGINAL HYSTERECTOMY  1995   w/ Bilateral Salpingoophorectomy    There were no vitals filed for this visit.  Subjective Assessment - 09/07/18 1518    Subjective  Pt stated her pain scale Lt side of neck and halfway down Lt shoulder, pain scale 4/10.  Reports the massage last session was painful during but feels beneficial the day after.    Pertinent History  TKR,     Patient Stated Goals  PT is waking up 3-4 x a night in pain and having difficulty sleeping     Currently in Pain?  Yes    Pain Score  4     Pain Location  Neck    Pain Orientation  Left    Pain Descriptors / Indicators  --   sharp wiht movement   Pain Radiating Towards  Lt shoulder    Pain Onset  More than a month ago    Pain Frequency  Constant  Aggravating Factors   working    Pain Relieving Factors  shot    Effect of Pain on Daily Activities  limits         [facility] PT Assessment - 09/07/18 0001      Assessment   Medical Diagnosis  cervical radiculopathy LT     Referring Provider (PT)  Odette Fraction    Onset Date/Surgical Date  10/13/17    Next MD Visit  Receives shot on 10/18/17    Prior Therapy  chiropractor                   [facility] Adult PT Treatment/Exercise - 09/07/18 0001      Neck Exercises: Seated   Neck Retraction  10 reps    Cervical Rotation  10 reps    Cervical Rotation Limitations  with chin tucks    W Back  10 reps    W Back Limitations  reports pain lateral shoulder    Shoulder Rolls  Backwards;10 reps   shoulders up, back and down   Other Seated Exercise  scapular retraction      Neck Exercises: Supine   Neck Retraction  10 reps;3 secs    Neck Retraction Limitations  2 sets      Moist Heat  Therapy   Number Minutes Moist Heat  5 Minutes    Moist Heat Location  Cervical   during manual traction     Manual Therapy   Manual Therapy  Soft tissue mobilization;Myofascial release;Passive ROM    Manual therapy comments  done seperate from all other aspects of treatment.     Soft tissue mobilization  Upper trap to trigger points supine    Myofascial Release  suboccipital release in supine with MHP    Manual Traction  5x30" with MHP               PT Short Term Goals - 07/17/18 1401      PT SHORT TERM GOAL #1   Title  PT rotation to improve to 50 degrees B to improve safety while driving.     Time  2    Period  Weeks    Status  On-going      PT SHORT TERM GOAL #2   Title  PT pain to be radiating no further than her shoulder area to demonstrate decreased nerve root irritation.     Time  2    Period  Weeks    Status  On-going      PT SHORT TERM GOAL #3   Title  Pt to be able to use LT hand for light activity like holding dishes while drying, folding clothes without increased pain.     Time  2    Period  Weeks    Status  On-going        PT Long Term Goals - 07/17/18 1402      PT LONG TERM GOAL #1   Title  PT cervical rotation to be 60 degrees B for safer driving.     Time  4    Period  Weeks    Status  On-going      PT LONG TERM GOAL #2   Title  PT pain level in her neck and Lt arm to be no greater than a 4/10 to allow pt to return to yard work activity.     Time  4    Period  Weeks    Status  On-going  PT LONG TERM GOAL #3   Title  Pt to no longer be having radicular sx to demonstrate decreased nerve root irritation.     Time  4    Period  Weeks    Status  On-going            Plan - 09/07/18 1850    Clinical Impression Statement  Session focus on postural stability and manual technqieus to address radicular symptoms down Lt UE.  Able to reduce spasm Lt UT but unable to fully resolve.  EOS pt reoprts pain reduced and no radicular symptoms.       Rehab Potential  Fair    PT Frequency  3x / week    PT Duration  4 weeks    PT Treatment/Interventions  ADLs/Self Care Home Management;Therapeutic activities;Therapeutic exercise;Patient/family education;Manual techniques;Passive range of motion;Joint Manipulations    PT Next Visit Plan  Continue with manual and cervical stability exercises. Continue sitting isometrics, ROM but do not complete if pt has any radicular sx.  Will need 10 visit progress note at visit #14.    PT Home Exercise Plan  HEP:  cervical and scapular retraction,  sitting tall, Lt SB, and extension.        Patient will benefit from skilled therapeutic intervention in order to improve the following deficits and impairments:  Decreased activity tolerance, Decreased range of motion, Decreased strength, Hypomobility, Increased fascial restricitons, Increased muscle spasms, Impaired UE functional use, Obesity, Pain  Visit Diagnosis: Radiculopathy, cervical region  Abnormal posture     Problem List Patient Active Problem List   Diagnosis Date Noted  . Personal history of kidney stones 02/23/2015  . Atherosclerosis 02/23/2015  . Hx of low back pain 05/06/2014  . Encounter for preventive health examination 05/06/2014  . BMI 32.0-32.9,adult 11/05/2013  . Throat clearing 05/03/2013  . Nocturnal leg cramps 10/31/2012  . Low back pain 12/27/2011  . Pre-diabetes 09/27/2011  . Menopausal hot flushes 04/12/2011  . Knee pain 11/22/2010  . OBESITY 12/23/2008  . NEPHROLITHIASIS, HX OF 03/13/2008  . Hypothyroidism 06/29/2007  . HYPERLIPIDEMIA 06/29/2007  . Essential hypertension 06/29/2007  . ALLERGIC RHINITIS 06/29/2007  . GERD 06/29/2007  . POSTMENOPAUSAL STATUS 06/29/2007  . LEG PAIN, CHRONIC 06/29/2007  . HYPERGLYCEMIA, FASTING 06/29/2007   Becky Sax, LPTA; CBIS [phone]  Juel Burrow 09/07/2018, 6:55 PM  [facility] [address] [place],  [place], [address] Phone: [phone]   Fax:  [phone]  Name: Kerry Walters MRN: [id number] Date of Birth: November 10, 1945

## 2018-09-10 ENCOUNTER — Ambulatory Visit (HOSPITAL_COMMUNITY): Payer: Medicare Other | Admitting: Physical Therapy

## 2018-09-10 DIAGNOSIS — R293 Abnormal posture: Secondary | ICD-10-CM

## 2018-09-10 DIAGNOSIS — M5412 Radiculopathy, cervical region: Secondary | ICD-10-CM

## 2018-09-10 DIAGNOSIS — M5136 Other intervertebral disc degeneration, lumbar region: Secondary | ICD-10-CM | POA: Diagnosis not present

## 2018-09-10 DIAGNOSIS — M9903 Segmental and somatic dysfunction of lumbar region: Secondary | ICD-10-CM | POA: Diagnosis not present

## 2018-09-10 DIAGNOSIS — M9902 Segmental and somatic dysfunction of thoracic region: Secondary | ICD-10-CM | POA: Diagnosis not present

## 2018-09-10 DIAGNOSIS — M5134 Other intervertebral disc degeneration, thoracic region: Secondary | ICD-10-CM | POA: Diagnosis not present

## 2018-09-10 NOTE — Patient Instructions (Addendum)
Getting Into / Out of Bed    Lower self to lie down on one side by raising legs and lowering head at the same time. Use arms to assist moving without twisting. Bend both knees to roll onto back if desired. To sit up, start from lying on side, and use same move-ments in reverse. Keep trunk aligned with legs.   Copyright  VHI. All rights reserved.  Getting Into / Out of Bed    Lower self to lie down on one side by raising legs and lowering head at the same time. Use arms to assist moving without twisting. Bend both knees to roll onto back if desired. To sit up, start from lying on side, and use same move-ments in reverse. Keep trunk aligned with legs.   Copyright  VHI. All rights reserved.

## 2018-09-10 NOTE — Therapy (Signed)
[place], [place], [address] Phone: [phone]   Fax:  [phone]  Physical Therapy Treatment  Patient Details  Name: Kerry Walters MRN: [id number] Date of Birth: 1946-05-09 Referring Provider (PT): Clydell Hakim   Encounter Date: 09/10/2018  PT End of Session - 09/10/18 1351    Visit Number  12   Progress note complete visit #4, FOTO complete #10   Number of Visits  13   Date for PT Re-Evaluation  09/27/18    Authorization Type  [employer] MEdicare     Authorization Time Period  08/15/18-->09/27/18    Authorization - Visit Number  12    Authorization - Number of Visits  13    PT Start Time  6962    PT Stop Time  1430    PT Time Calculation (min)  45 min    Activity Tolerance  Patient tolerated treatment well    Behavior During Therapy  [facility] for tasks assessed/performed       Past Medical History:  Diagnosis Date  . Asthma due to environmental allergies    no inhaler  . Diverticulosis of colon   . GERD (gastroesophageal reflux disease)   . History of esophageal dilatation    FOR STRICTURE IN 2005  . History of hiatal hernia   . History of hypercalcemia    SECONDARY TO HYPERPARATHYROID  . History of hyperparathyroidism    PRIMARY--  S/P RIGHT PARATHYROIDECTOMY  . History of kidney stones   . History of recurrent UTIs   . Hyperlipidemia   . Hypertension   . Hypothyroidism   . Incomplete right bundle branch block   . Nephrolithiasis    RIGHT   . Nocturia   . PONV (postoperative nausea and vomiting)   . Pre-diabetes   . Sciatica of right side     Past Surgical History:  Procedure Laterality Date  . APPENDECTOMY  1974  . CYSTOSCOPY WITH URETEROSCOPY AND STENT PLACEMENT Right 11/11/2014   Procedure: CYSTOSCOPY WITH URETEROSCOPY AND STENT PLACEMENT;  Surgeon: Ardis Hughs, MD;  Location[facility];  Service: Urology;  Laterality: Right;  . EXCISION URETHRAL CARUNCLE  1981   Open  procedure  . KNEE ARTHROSCOPY Left april 2010  . REMOVAL RIP  1973   First Rib on Right side-- for impingement  . RIGHT INFERIOR PARATHYROIDECTOMY, MINIMALLY INVASIVE  08-14-2008  . TONSILLECTOMY  as child  . TOTAL KNEE ARTHROPLASTY Left 11-17-2009  . TUBAL LIGATION  1973  . VAGINAL HYSTERECTOMY  1995   w/ Bilateral Salpingoophorectomy    There were no vitals filed for this visit.  Subjective Assessment - 09/10/18 1348    Subjective  PT states that her neck is never good; it's her 3rd shot and the second lasted for three weeks     Pertinent History  TKR,     Patient Stated Goals  PT states waking up varies sometimes it's one time a night others 4 times a night was  waking up 3-4 x a night in pain and having difficulty sleeping     Currently in Pain?  Yes    Pain Score  4     Pain Location  Neck    Pain Orientation  Right;Left    Pain Onset  More than a month ago         [facility] PT Assessment - 09/10/18 0001      Assessment   Medical Diagnosis  cervical radiculopathy LT  Referring Provider (PT)  Clydell Hakim    Onset Date/Surgical Date  10/13/17    Next MD Visit  Receives shot on 10/18/17    Prior Therapy  chiropractor      AROM   Cervical Flexion  45   was 45   Cervical Extension  35   was 35   Cervical - Right Side Bend  25   was 25   Cervical - Left Side Bend  30   was 15    Cervical - Right Rotation  60   was 35    Cervical - Left Rotation  50   was 30      Strength   Cervical Extension  3+/5    Cervical - Right Side Bend  3+/5    Cervical - Left Side Bend  3+/5                   [facility] Adult PT Treatment/Exercise - 09/10/18 0001      Neck Exercises: Seated   Neck Retraction  20 reps;3 secs    Cervical Rotation  10 reps    Cervical Rotation Limitations  with chin tucks    Shoulder Rolls  Backwards;10 reps   shoulders up, back and down   Other Seated Exercise  scapular retraction      Neck Exercises: Supine   Cervical Isometrics   Extension;Right lateral flexion;Left lateral flexion;3 secs;10 reps    Neck Retraction  10 reps;3 secs    Neck Retraction Limitations  2 sets      Modalities   Modalities  Moist Heat      Moist Heat Therapy   Number Minutes Moist Heat  12 Minutes    Moist Heat Location  Cervical   during manual traction     Manual Therapy   Manual Therapy  Soft tissue mobilization;Myofascial release;Passive ROM    Manual therapy comments  done seperate from all other aspects of treatment.     Soft tissue mobilization  Upper trap to trigger points supine    Myofascial Release  suboccipital release in supine with MHP    Manual Traction  5x30" with MHP               PT Short Term Goals - 09/10/18 1359      PT SHORT TERM GOAL #1   Title  PT rotation to improve to 50 degrees B to improve safety while driving.     Time  2    Period  Weeks    Status  Achieved      PT SHORT TERM GOAL #2   Title  PT pain to be radiating no further than her shoulder area to demonstrate decreased nerve root irritation.     Time  2    Period  Weeks    Status  Not Met      PT SHORT TERM GOAL #3   Title  Pt to be able to use LT hand for light activity like holding dishes while drying, folding clothes without increased pain.     Time  2    Period  Weeks    Status  Achieved        PT Long Term Goals - 09/10/18 1400      PT LONG TERM GOAL #1   Title  PT cervical rotation to be 60 degrees B for safer driving.     Time  4    Period  Weeks    Status  Partially Met   Rt tyo 60 Lt to 50      PT LONG TERM GOAL #2   Title  PT pain level in her neck and Lt arm to be no greater than a 4/10 to allow pt to return to yard work activity.     Time  4    Period  Weeks    Status  On-going      PT LONG TERM GOAL #3   Title  Pt to no longer be having radicular sx to demonstrate decreased nerve root irritation.     Time  4    Period  Weeks    Status  Not Met            Plan - 09/10/18 1425    Clinical  Impression Statement  Pt reassessed with improved ROM but pain and strength have not improved significantly.  PT and therapist agreed to discharge to HEP at this time.     Rehab Potential  Fair    PT Frequency  3x / week    PT Duration  4 weeks    PT Treatment/Interventions  ADLs/Self Care Home Management;Therapeutic activities;Therapeutic exercise;Patient/family education;Manual techniques;Passive range of motion;Joint Manipulations    PT Next Visit Plan  PT has one more treatment focus on completing ADL's with proper body mechanics and bed mobility. Answer any questions and discharge.       PT Home Exercise Plan  HEP:  cervical and scapular retraction,  sitting tall, Lt SB, and extension.        Patient will benefit from skilled therapeutic intervention in order to improve the following deficits and impairments:  Decreased activity tolerance, Decreased range of motion, Decreased strength, Hypomobility, Increased fascial restricitons, Increased muscle spasms, Impaired UE functional use, Obesity, Pain  Visit Diagnosis: Radiculopathy, cervical region  Abnormal posture     Problem List Patient Active Problem List   Diagnosis Date Noted  . Personal history of kidney stones 02/23/2015  . Atherosclerosis 02/23/2015  . Hx of low back pain 05/06/2014  . Encounter for preventive health examination 05/06/2014  . BMI 32.0-32.9,adult 11/05/2013  . Throat clearing 05/03/2013  . Nocturnal leg cramps 10/31/2012  . Low back pain 12/27/2011  . Pre-diabetes 09/27/2011  . Menopausal hot flushes 04/12/2011  . Knee pain 11/22/2010  . OBESITY 12/23/2008  . NEPHROLITHIASIS, HX OF 03/13/2008  . Hypothyroidism 06/29/2007  . HYPERLIPIDEMIA 06/29/2007  . Essential hypertension 06/29/2007  . ALLERGIC RHINITIS 06/29/2007  . GERD 06/29/2007  . POSTMENOPAUSAL STATUS 06/29/2007  . LEG PAIN, CHRONIC 06/29/2007  . HYPERGLYCEMIA, FASTING 06/29/2007    Rayetta Humphrey, PT CLT [phone] 09/10/2018,  2:34 PM  [place] [address] [place], [place], [address] Phone: [phone]   Fax:  [phone]  Name: Kerry Walters MRN: [id number] Date of Birth: 07-Apr-1946

## 2018-09-13 ENCOUNTER — Ambulatory Visit (HOSPITAL_COMMUNITY): Payer: Medicare Other | Admitting: Physical Therapy

## 2018-09-13 DIAGNOSIS — M5412 Radiculopathy, cervical region: Secondary | ICD-10-CM | POA: Diagnosis not present

## 2018-09-13 DIAGNOSIS — R293 Abnormal posture: Secondary | ICD-10-CM | POA: Diagnosis not present

## 2018-09-13 NOTE — Therapy (Addendum)
[place], [place], [address] Phone: [phone]   Fax:  [phone]  Physical Therapy Treatment  Patient Details  Name: Kerry Walters MRN: [id number] Date of Birth: 09-03-1945 Referring Provider (PT): Clydell Hakim  PHYSICAL THERAPY DISCHARGE SUMMARY  Visits from Start of Care: 13  Current functional level related to goals / functional outcomes: See below   Remaining deficits: High amount of pain    Education / Equipment: HEP; cervical stabilization  Plan: Patient agrees to discharge.  Patient goals were partially met. Patient is being discharged due to lack of progress.  ?????      Encounter Date: 09/13/2018  PT End of Session - 09/13/18 1533    Visit Number  13   Progress note complete visit #4, FOTO complete #12   Number of Visits  14    Date for PT Re-Evaluation  09/27/18    Authorization Type  [employer]     Authorization Time Period  08/15/18-->09/27/18    Authorization - Visit Number  13    Authorization - Number of Visits  14    PT Start Time  9163    PT Stop Time  1558    PT Time Calculation (min)  42 min    Activity Tolerance  Patient tolerated treatment well    Behavior During Therapy  WFL for tasks assessed/performed       Past Medical History:  Diagnosis Date  . Asthma due to environmental allergies    no inhaler  . Diverticulosis of colon   . GERD (gastroesophageal reflux disease)   . History of esophageal dilatation    FOR STRICTURE IN 2005  . History of hiatal hernia   . History of hypercalcemia    SECONDARY TO HYPERPARATHYROID  . History of hyperparathyroidism    PRIMARY--  S/P RIGHT PARATHYROIDECTOMY  . History of kidney stones   . History of recurrent UTIs   . Hyperlipidemia   . Hypertension   . Hypothyroidism   . Incomplete right bundle branch block   . Nephrolithiasis    RIGHT   . Nocturia   . PONV (postoperative nausea and vomiting)   . Pre-diabetes   .  Sciatica of right side     Past Surgical History:  Procedure Laterality Date  . APPENDECTOMY  1974  . CYSTOSCOPY WITH URETEROSCOPY AND STENT PLACEMENT Right 11/11/2014   Procedure: CYSTOSCOPY WITH URETEROSCOPY AND STENT PLACEMENT;  Surgeon: Ardis Hughs, MD;  Location: [facility];  Service: Urology;  Laterality: Right;  . EXCISION URETHRAL CARUNCLE  1981   Open procedure  . KNEE ARTHROSCOPY Left april 2010  . REMOVAL RIP  1973   First Rib on Right side-- for impingement  . RIGHT INFERIOR PARATHYROIDECTOMY, MINIMALLY INVASIVE  08-14-2008  . TONSILLECTOMY  as child  . TOTAL KNEE ARTHROPLASTY Left 11-17-2009  . TUBAL LIGATION  1973  . VAGINAL HYSTERECTOMY  1995   w/ Bilateral Salpingoophorectomy    There were no vitals filed for this visit.  Subjective Assessment - 09/13/18 1526    Subjective  pt returns today without questions or issues regarding HEP or POC.      Currently in Pain?  Yes    Pain Score  4     Pain Location  Neck    Pain Orientation  Right;Left    Pain Type  Chronic pain              09/13/18 1538  Assessment  Medical Diagnosis cervical radiculopathy LT   Referring Provider (PT) Clydell Hakim  Onset Date/Surgical Date 10/13/17  Next MD Visit Receives shot on 10/18/17  Prior Therapy chiropractor  AROM  Cervical Flexion 45 (was 45)  Cervical Extension 35 (was 35)  Cervical - Right Side Bend 25 (was 25)  Cervical - Left Side Bend 30 (was 15 )  Cervical - Right Rotation 60 (was 35 )  Cervical - Left Rotation 50 (was 30 )  Strength  Cervical Extension 3+/5  Cervical - Right Side Bend 3+/5  Cervical - Left Side Bend 3+/5               [facility] Adult PT Treatment/Exercise - 09/13/18 0001      Modalities   Modalities  Moist Heat      Moist Heat Therapy   Number Minutes Moist Heat  10 Minutes    Moist Heat Location  Cervical   during manual      Manual Therapy   Manual Therapy  Soft tissue mobilization;Myofascial  release;Passive ROM    Manual therapy comments  done seperate from all other aspects of treatment.     Soft tissue mobilization  Upper trap to trigger points supine    Myofascial Release  suboccipital release in supine with MHP    Manual Traction  5x30" with MHP             PT Education - 09/13/18 1531    Education Details  reviewed body mechanics, i.e. use of environment objects to improve posture/bring to correct height.  Bed mobility, cooking/dishes, etc to reduce stress on spine.    Person(s) Educated  Patient    Methods  Explanation    Comprehension  Verbalized understanding       PT Short Term Goals - 09/13/18 1528      PT SHORT TERM GOAL #1   Title  PT rotation to improve to 50 degrees B to improve safety while driving.     Time  2    Period  Weeks    Status  Achieved      PT SHORT TERM GOAL #2   Title  PT pain to be radiating no further than her shoulder area to demonstrate decreased nerve root irritation.     Time  2    Period  Weeks    Status  Not Met      PT SHORT TERM GOAL #3   Title  Pt to be able to use LT hand for light activity like holding dishes while drying, folding clothes without increased pain.     Time  2    Period  Weeks    Status  Achieved        PT Long Term Goals - 09/13/18 1528      PT LONG TERM GOAL #1   Title  PT cervical rotation to be 60 degrees B for safer driving.     Time  4    Period  Weeks    Status  Partially Met   Rt tyo 60 Lt to 50      PT LONG TERM GOAL #2   Title  PT pain level in her neck and Lt arm to be no greater than a 4/10 to allow pt to return to yard work activity.     Time  4    Period  Weeks    Status  Not Met      PT LONG TERM GOAL #3  Title  Pt to no longer be having radicular sx to demonstrate decreased nerve root irritation.     Time  4    Period  Weeks    Status  Not Met            Plan - 09/13/18 1529    Clinical Impression Statement  Pt returns today without issues or concerns  regarding discharge.  Pt able to display correct posturing completing ADL's and with general bed mobility.    Per last visit reassessment, pt has not made significant improvments and only gets relief through manual therapies.  Pt and therapist agreed to discharge at this time and f/u with MD for further instructions.    Rehab Potential  Fair    PT Frequency  3x / week    PT Duration  4 weeks    PT Treatment/Interventions  ADLs/Self Care Home Management;Therapeutic activities;Therapeutic exercise;Patient/family education;Manual techniques;Passive range of motion;Joint Manipulations    PT Next Visit Plan  discharge.    PT Home Exercise Plan  HEP:  cervical and scapular retraction,  sitting tall, Lt SB, and extension.        Patient will benefit from skilled therapeutic intervention in order to improve the following deficits and impairments:  Decreased activity tolerance, Decreased range of motion, Decreased strength, Hypomobility, Increased fascial restricitons, Increased muscle spasms, Impaired UE functional use, Obesity, Pain  Visit Diagnosis: Radiculopathy, cervical region  Abnormal posture     Problem List Patient Active Problem List   Diagnosis Date Noted  . Personal history of kidney stones 02/23/2015  . Atherosclerosis 02/23/2015  . Hx of low back pain 05/06/2014  . Encounter for preventive health examination 05/06/2014  . BMI 32.0-32.9,adult 11/05/2013  . Throat clearing 05/03/2013  . Nocturnal leg cramps 10/31/2012  . Low back pain 12/27/2011  . Pre-diabetes 09/27/2011  . Menopausal hot flushes 04/12/2011  . Knee pain 11/22/2010  . OBESITY 12/23/2008  . NEPHROLITHIASIS, HX OF 03/13/2008  . Hypothyroidism 06/29/2007  . HYPERLIPIDEMIA 06/29/2007  . Essential hypertension 06/29/2007  . ALLERGIC RHINITIS 06/29/2007  . GERD 06/29/2007  . POSTMENOPAUSAL STATUS 06/29/2007  . LEG PAIN, CHRONIC 06/29/2007  . HYPERGLYCEMIA, FASTING 06/29/2007   Teena Irani,  PTA/CLT [place], PT CLT [phone] 09/13/2018, 4:01 PM  [facility] [address] [place], [place], [address] Phone: [phone]   Fax:  [phone]  Name: Kerry Walters MRN: [id number] Date of Birth: [birth year]

## 2018-09-16 ENCOUNTER — Other Ambulatory Visit: Payer: Self-pay | Admitting: Internal Medicine

## 2018-10-02 DIAGNOSIS — M9902 Segmental and somatic dysfunction of thoracic region: Secondary | ICD-10-CM | POA: Diagnosis not present

## 2018-10-02 DIAGNOSIS — M5134 Other intervertebral disc degeneration, thoracic region: Secondary | ICD-10-CM | POA: Diagnosis not present

## 2018-10-02 DIAGNOSIS — M9903 Segmental and somatic dysfunction of lumbar region: Secondary | ICD-10-CM | POA: Diagnosis not present

## 2018-10-02 DIAGNOSIS — M5136 Other intervertebral disc degeneration, lumbar region: Secondary | ICD-10-CM | POA: Diagnosis not present

## 2018-10-18 DIAGNOSIS — M47812 Spondylosis without myelopathy or radiculopathy, cervical region: Secondary | ICD-10-CM | POA: Diagnosis not present

## 2018-10-22 ENCOUNTER — Other Ambulatory Visit: Payer: Self-pay | Admitting: Internal Medicine

## 2018-10-22 DIAGNOSIS — M9903 Segmental and somatic dysfunction of lumbar region: Secondary | ICD-10-CM | POA: Diagnosis not present

## 2018-10-22 DIAGNOSIS — M5136 Other intervertebral disc degeneration, lumbar region: Secondary | ICD-10-CM | POA: Diagnosis not present

## 2018-10-22 DIAGNOSIS — M5134 Other intervertebral disc degeneration, thoracic region: Secondary | ICD-10-CM | POA: Diagnosis not present

## 2018-10-22 DIAGNOSIS — M9902 Segmental and somatic dysfunction of thoracic region: Secondary | ICD-10-CM | POA: Diagnosis not present

## 2018-10-24 ENCOUNTER — Other Ambulatory Visit: Payer: Self-pay | Admitting: Internal Medicine

## 2018-10-25 DIAGNOSIS — M5136 Other intervertebral disc degeneration, lumbar region: Secondary | ICD-10-CM | POA: Diagnosis not present

## 2018-10-25 DIAGNOSIS — E119 Type 2 diabetes mellitus without complications: Secondary | ICD-10-CM | POA: Diagnosis not present

## 2018-10-25 DIAGNOSIS — H40013 Open angle with borderline findings, low risk, bilateral: Secondary | ICD-10-CM | POA: Diagnosis not present

## 2018-10-25 DIAGNOSIS — M5134 Other intervertebral disc degeneration, thoracic region: Secondary | ICD-10-CM | POA: Diagnosis not present

## 2018-10-25 DIAGNOSIS — M9903 Segmental and somatic dysfunction of lumbar region: Secondary | ICD-10-CM | POA: Diagnosis not present

## 2018-10-25 DIAGNOSIS — M9902 Segmental and somatic dysfunction of thoracic region: Secondary | ICD-10-CM | POA: Diagnosis not present

## 2018-10-25 DIAGNOSIS — H2513 Age-related nuclear cataract, bilateral: Secondary | ICD-10-CM | POA: Diagnosis not present

## 2018-10-25 LAB — HM DIABETES EYE EXAM

## 2018-11-05 DIAGNOSIS — M5134 Other intervertebral disc degeneration, thoracic region: Secondary | ICD-10-CM | POA: Diagnosis not present

## 2018-11-05 DIAGNOSIS — M5136 Other intervertebral disc degeneration, lumbar region: Secondary | ICD-10-CM | POA: Diagnosis not present

## 2018-11-05 DIAGNOSIS — M9902 Segmental and somatic dysfunction of thoracic region: Secondary | ICD-10-CM | POA: Diagnosis not present

## 2018-11-05 DIAGNOSIS — M9903 Segmental and somatic dysfunction of lumbar region: Secondary | ICD-10-CM | POA: Diagnosis not present

## 2018-11-08 DIAGNOSIS — M9902 Segmental and somatic dysfunction of thoracic region: Secondary | ICD-10-CM | POA: Diagnosis not present

## 2018-11-08 DIAGNOSIS — M9903 Segmental and somatic dysfunction of lumbar region: Secondary | ICD-10-CM | POA: Diagnosis not present

## 2018-11-08 DIAGNOSIS — M5136 Other intervertebral disc degeneration, lumbar region: Secondary | ICD-10-CM | POA: Diagnosis not present

## 2018-11-08 DIAGNOSIS — M5134 Other intervertebral disc degeneration, thoracic region: Secondary | ICD-10-CM | POA: Diagnosis not present

## 2018-11-15 ENCOUNTER — Encounter: Payer: Self-pay | Admitting: Internal Medicine

## 2018-11-19 DIAGNOSIS — M9902 Segmental and somatic dysfunction of thoracic region: Secondary | ICD-10-CM | POA: Diagnosis not present

## 2018-11-19 DIAGNOSIS — M5134 Other intervertebral disc degeneration, thoracic region: Secondary | ICD-10-CM | POA: Diagnosis not present

## 2018-11-19 DIAGNOSIS — M9903 Segmental and somatic dysfunction of lumbar region: Secondary | ICD-10-CM | POA: Diagnosis not present

## 2018-11-19 DIAGNOSIS — M5136 Other intervertebral disc degeneration, lumbar region: Secondary | ICD-10-CM | POA: Diagnosis not present

## 2018-11-19 DIAGNOSIS — M47812 Spondylosis without myelopathy or radiculopathy, cervical region: Secondary | ICD-10-CM | POA: Diagnosis not present

## 2018-11-19 DIAGNOSIS — M5412 Radiculopathy, cervical region: Secondary | ICD-10-CM | POA: Diagnosis not present

## 2018-11-22 ENCOUNTER — Other Ambulatory Visit: Payer: Self-pay

## 2018-11-22 ENCOUNTER — Telehealth: Payer: Self-pay | Admitting: Internal Medicine

## 2018-11-22 MED ORDER — LEVOTHYROXINE SODIUM 75 MCG PO TABS: 75.0000 ug | ORAL_TABLET | Freq: Every day | ORAL | 1 refills | Status: DC

## 2018-11-22 NOTE — Telephone Encounter (Signed)
Talked to pt and sent in refill for snythriod

## 2018-11-22 NOTE — Telephone Encounter (Signed)
Pt was called to r/s the CPE visit and she stated that she need to have a refill on all her medications especially her Metformin she will be out of it next week.  Pharm:  [facility]  Pt would like to have a call to discuss in detail

## 2018-11-27 DIAGNOSIS — M5136 Other intervertebral disc degeneration, lumbar region: Secondary | ICD-10-CM | POA: Diagnosis not present

## 2018-11-27 DIAGNOSIS — M9902 Segmental and somatic dysfunction of thoracic region: Secondary | ICD-10-CM | POA: Diagnosis not present

## 2018-11-27 DIAGNOSIS — M9903 Segmental and somatic dysfunction of lumbar region: Secondary | ICD-10-CM | POA: Diagnosis not present

## 2018-11-27 DIAGNOSIS — M5134 Other intervertebral disc degeneration, thoracic region: Secondary | ICD-10-CM | POA: Diagnosis not present

## 2018-12-03 ENCOUNTER — Encounter: Payer: Medicare Other | Admitting: Internal Medicine

## 2018-12-26 ENCOUNTER — Other Ambulatory Visit: Payer: Self-pay

## 2018-12-26 ENCOUNTER — Telehealth: Payer: Self-pay | Admitting: Internal Medicine

## 2018-12-26 MED ORDER — ATORVASTATIN CALCIUM 20 MG PO TABS: 20.0000 mg | ORAL_TABLET | Freq: Every evening | ORAL | 3 refills | Status: DC

## 2018-12-26 NOTE — Telephone Encounter (Unsigned)
Copied from CRM [id number]. Topic: Quick Communication - Rx Refill/Question >> Dec 26, 2018 10:19 AM Wyonia Hough E wrote: Medication: atorvastatin (LIPITOR) 20 MG tablet   Has the patient contacted their pharmacy? Yes - request faxed to office   Preferred Pharmacy (with phone number or street name): [facility] - [place], [place] - [address] [phone] (Phone) [phone] (Fax)    Agent: Please be advised that RX refills may take up to 3 business days. We ask that you follow-up with your pharmacy.

## 2018-12-26 NOTE — Telephone Encounter (Signed)
Refill sent in

## 2018-12-27 DIAGNOSIS — M9902 Segmental and somatic dysfunction of thoracic region: Secondary | ICD-10-CM | POA: Diagnosis not present

## 2018-12-27 DIAGNOSIS — M5134 Other intervertebral disc degeneration, thoracic region: Secondary | ICD-10-CM | POA: Diagnosis not present

## 2018-12-27 DIAGNOSIS — M9903 Segmental and somatic dysfunction of lumbar region: Secondary | ICD-10-CM | POA: Diagnosis not present

## 2018-12-27 DIAGNOSIS — M5136 Other intervertebral disc degeneration, lumbar region: Secondary | ICD-10-CM | POA: Diagnosis not present

## 2019-01-21 ENCOUNTER — Other Ambulatory Visit: Payer: Self-pay | Admitting: Internal Medicine

## 2019-01-31 DIAGNOSIS — M5136 Other intervertebral disc degeneration, lumbar region: Secondary | ICD-10-CM | POA: Diagnosis not present

## 2019-01-31 DIAGNOSIS — M9903 Segmental and somatic dysfunction of lumbar region: Secondary | ICD-10-CM | POA: Diagnosis not present

## 2019-01-31 DIAGNOSIS — M9902 Segmental and somatic dysfunction of thoracic region: Secondary | ICD-10-CM | POA: Diagnosis not present

## 2019-01-31 DIAGNOSIS — M5134 Other intervertebral disc degeneration, thoracic region: Secondary | ICD-10-CM | POA: Diagnosis not present

## 2019-02-18 ENCOUNTER — Other Ambulatory Visit: Payer: Medicare Other

## 2019-02-18 ENCOUNTER — Other Ambulatory Visit: Payer: Self-pay

## 2019-02-18 DIAGNOSIS — R6889 Other general symptoms and signs: Secondary | ICD-10-CM | POA: Diagnosis not present

## 2019-02-18 DIAGNOSIS — Z20822 Contact with and (suspected) exposure to covid-19: Secondary | ICD-10-CM

## 2019-02-18 NOTE — Progress Notes (Signed)
[id number] 

## 2019-02-19 DIAGNOSIS — M5134 Other intervertebral disc degeneration, thoracic region: Secondary | ICD-10-CM | POA: Diagnosis not present

## 2019-02-19 DIAGNOSIS — M9902 Segmental and somatic dysfunction of thoracic region: Secondary | ICD-10-CM | POA: Diagnosis not present

## 2019-02-19 DIAGNOSIS — M9903 Segmental and somatic dysfunction of lumbar region: Secondary | ICD-10-CM | POA: Diagnosis not present

## 2019-02-19 DIAGNOSIS — M5136 Other intervertebral disc degeneration, lumbar region: Secondary | ICD-10-CM | POA: Diagnosis not present

## 2019-02-22 LAB — NOVEL CORONAVIRUS, NAA: SARS-CoV-2, NAA: NOT DETECTED

## 2019-02-27 DIAGNOSIS — M5134 Other intervertebral disc degeneration, thoracic region: Secondary | ICD-10-CM | POA: Diagnosis not present

## 2019-02-27 DIAGNOSIS — M9902 Segmental and somatic dysfunction of thoracic region: Secondary | ICD-10-CM | POA: Diagnosis not present

## 2019-02-27 DIAGNOSIS — M9903 Segmental and somatic dysfunction of lumbar region: Secondary | ICD-10-CM | POA: Diagnosis not present

## 2019-02-27 DIAGNOSIS — M5136 Other intervertebral disc degeneration, lumbar region: Secondary | ICD-10-CM | POA: Diagnosis not present

## 2019-03-01 NOTE — Progress Notes (Signed)
Chief Complaint  Patient presents with  . Annual Exam    pt would like to get a referral to dermatology     HPI: Kerry Walters 73 y.o. comes in today for Preventive Medicare exam/ wellness visit .Since last visit.  AND Chronic disease management   HLD ok taking atorva 3 d per week cause ? If  Higher doses bothers her legs   THYROID taking   Ht: felt ok     Gabapentin taking hs for legs    acoid reful is terrible  and  hacky cough   Zyrtec  Taking whne remembers.   Watery   Eyes helped    Health Maintenance  Topic Date Due  . Hepatitis C Screening  August 26, 1946  . DEXA SCAN  02/14/2009  . FOOT EXAM  08/30/2018  . HEMOGLOBIN A1C  12/31/2018  . INFLUENZA VACCINE  03/30/2019  . Fecal DNA (Cologuard)  09/01/2019  . OPHTHALMOLOGY EXAM  10/26/2019  . TETANUS/TDAP  11/23/2024  . PNA vac Low Risk Adult  Completed   Health Maintenance Review LIFESTYLE:  Exercise:   Not a lot and working outside   Tobacco/ETS: no Alcohol:  no Sugar beverages:  Sweets recently  no Sleep:  Nocturia x 2  About 8 hours  Drug use: no HH: puppy. 2  [employer] adopted .    Hearing:  Ok.  Vision:  No limitations at present . Last eye check UTD  Safety:  Has smoke detector and wears seat belts.  . No excess sun exposure. Sees dentist regularly.  Falls: n  Memory: no complaints   Depression: No anhedonia unusual crying or depressive symptoms son schizopherenic   Not paying bills  Has restraining order.   Nutrition: Eats well balanced diet; adequate calcium and vitamin D. No swallowing chewing problems.  Injury: no major injuries in the last six months.  Other healthcare providers:  Reviewed today .  Preventive parameters:   Reviewed   ADLS:   There are no problems or need for assistance  driving, feeding, obtaining food, dressing, toileting and bathing, managing money using phone. She is independent.  ROS:  Hx of renal stones  GEN/ HEENT: No fever, significant weight changes sweats  headaches vision problems hearing changes, CV/ PULM; No chest pain shortness of breath cough, syncope,edema  change in exercise tolerance. GI /GU: No adominal pain, vomiting, change in bowel habits. No blood in the stool. No significant GU symptoms. SKIN/HEME: ,no acute skin rashes suspicious lesions or bleeding. No lymphadenopathy, nodules, masses.  NEURO/ PSYCH:  No neurologic signs such as weakness numbness. No depression anxiety. IMM/ Allergy: No unusual infections.  Allergy .   REST of 12 system review negative except as per HPI   Past Medical History:  Diagnosis Date  . Asthma due to environmental allergies    no inhaler  . Diverticulosis of colon   . GERD (gastroesophageal reflux disease)   . History of esophageal dilatation    FOR STRICTURE IN 2005  . History of hiatal hernia   . History of hypercalcemia    SECONDARY TO HYPERPARATHYROID  . History of hyperparathyroidism    PRIMARY--  S/P RIGHT PARATHYROIDECTOMY  . History of kidney stones   . History of recurrent UTIs   . Hyperlipidemia   . Hypertension   . Hypothyroidism   . Incomplete right bundle branch block   . Nephrolithiasis    RIGHT   . Nocturia   . PONV (postoperative nausea and vomiting)   .  Pre-diabetes   . Sciatica of right side     Family History  Problem Relation Age of Onset  . Cancer Son        liver  . Cancer Father        brain  . Diabetes Sister   . Schizophrenia Son     Social History   Socioeconomic History  . Marital status: Married    Spouse name: Not on file  . Number of children: Not on file  . Years of education: Not on file  . Highest education level: Not on file  Occupational History  . Not on file  Social Needs  . Financial resource strain: Not on file  . Food insecurity    Worry: Not on file    Inability: Not on file  . Transportation needs    Medical: Not on file    Non-medical: Not on file  Tobacco Use  . Smoking status: Never Smoker  . Smokeless tobacco: Never  Used  Substance and Sexual Activity  . Alcohol use: No  . Drug use: No  . Sexual activity: Not on file  Lifestyle  . Physical activity    Days per week: Not on file    Minutes per session: Not on file  . Stress: Not on file  Relationships  . Social Herbalist on phone: Not on file    Gets together: Not on file    Attends religious service: Not on file    Active member of club or organization: Not on file    Attends meetings of clubs or organizations: Not on file    Relationship status: Not on file  Other Topics Concern  . Not on file  Social History Narrative   Married   hh of 2   4 dogs   G2 P2   Water exercise.   Retired    [employer]    great grandchild           Outpatient Encounter Medications as of 03/04/2019  Medication Sig  . atorvastatin (LIPITOR) 20 MG tablet Take 1 tablet (20 mg total) by mouth every evening.  . conjugated estrogens (PREMARIN) vaginal cream Place 1 Applicatorful vaginally as directed. Use 2-3 times weekly  prn  . gabapentin (NEURONTIN) 100 MG capsule TAKE 1-3 CAPSULES BY MOUTH  AT BEDTIME AS DIRECTED  . levothyroxine (SYNTHROID, LEVOTHROID) 75 MCG tablet Take 1 tablet (75 mcg total) by mouth daily.  . metFORMIN (GLUCOPHAGE-XR) 500 MG 24 hr tablet TAKE 3 TABLETS BY MOUTH  DAILY  . pantoprazole (PROTONIX) 40 MG tablet TAKE 1 TABLET BY MOUTH TWO  TIMES DAILY  . valsartan-hydrochlorothiazide (DIOVAN-HCT) 160-25 MG tablet TAKE 1 TABLET BY MOUTH  DAILY  . [DISCONTINUED] traMADol (ULTRAM) 50 MG tablet Take 50 mg by mouth every 6 (six) hours as needed.  . [DISCONTINUED] topiramate (TOPAMAX) 25 MG tablet    Facility-Administered Encounter Medications as of 03/04/2019  Medication  . betamethasone acetate-betamethasone sodium phosphate (CELESTONE) injection 3 mg  . betamethasone acetate-betamethasone sodium phosphate (CELESTONE) injection 3 mg    EXAM:  BP 134/66 (BP Location: Right Arm, Patient Position: Sitting, Cuff Size: Normal)   Pulse 75    Temp 98.5 F (36.9 C) (Oral)   Ht _0  (1.575 m)   Wt 160 lb (72.6 kg)   SpO2 97%   BMI 29.26 kg/m   Body mass index is 29.26 kg/m.  Physical Exam: Vital signs reviewed JOA:CZYS is a well-developed well-nourished alert cooperative  who appears stated age in no acute distress.  HEENT: normocephalic atraumatic , Eyes: PERRL EOM's full, conjunctiva clear, Nares: paten,t no deformity discharge or tenderness., Ears: no deformity EAC's clear TMs with normal landmarks. Mouth: clear OP, no lesions, edema.  Moist mucous membranes. Dentition in adequate repair. NECK: supple without masses, thyromegaly or bruits. CHEST/PULM:  Clear to auscultation and percussion breath sounds equal no wheeze , rales or rhonchi. No chest wall deformities or tenderness.Breast: normal by inspection . No dimpling, discharge, masses, tenderness or discharge . CV: PMI is nondisplaced, S1 S2 no gallops, murmurs, rubs. Peripheral pulses are full without delay.No JVD .  ABDOMEN: Bowel sounds normal nontender  No guard or rebound, no hepato splenomegal no CVA tenderness.   Extremtities:  No clubbing cyanosis or edema, no acute joint swelling or redness no focal atrophy NEURO:  Oriented x3, cranial nerves 3-12 appear to be intact, no obvious focal weakness,gait within normal limits no abnormal reflexes or asymmetrical SKIN: No acute rashes normal turgor, color, no bruising or petechiae. Skin changes  PSYCH: Oriented, good eye contact, no obvious depression anxiety, cognition and judgment appear normal. LN: no cervical axillary inguinal adenopathy No noted deficits in memory, attention, and speech. Diabetic Foot Exam - Simple   Simple Foot Form Diabetic Foot exam was performed with the following findings: Yes 03/04/2019 10:46 AM  Visual Inspection No deformities, no ulcerations, no other skin breakdown bilaterally: Yes Sensation Testing Intact to touch and monofilament testing bilaterally: Yes Pulse Check Posterior  Tibialis and Dorsalis pulse intact bilaterally: Yes Comments      Lab Results  Component Value Date   WBC 8.1 03/19/2018   HGB 13.0 03/19/2018   HCT 38.2 03/19/2018   PLT 282.0 03/19/2018   GLUCOSE 174 (H) 03/19/2018   CHOL 158 03/19/2018   TRIG 145.0 03/19/2018   HDL 41.40 03/19/2018   LDLCALC 88 03/19/2018   ALT 28 03/19/2018   AST 20 03/19/2018   NA 143 03/19/2018   K 4.0 03/19/2018   CL 102 03/19/2018   CREATININE 1.01 03/19/2018   BUN 20 03/19/2018   CO2 32 03/19/2018   TSH 3.42 03/19/2018   INR 1.12 11/19/2009   HGBA1C 6.4 (A) 07/02/2018   MICROALBUR 30.9 (H) 03/19/2018   Ate cereal blueberries and coffee.  nf   4 mos ago    ASSESSMENT AND PLAN:  Discussed the following assessment and plan:   ICD-10-CM   1. Visit for preventive health examination  Z00.00   2. Medication management  Q46.962 Basic metabolic panel    Hemoglobin A1c    Hepatic function panel    CBC with Differential/Platelet    Lipid panel    Microalbumin / creatinine urine ratio    TSH  3. Hypothyroidism, unspecified type  X52.8 Basic metabolic panel    Hemoglobin A1c    Hepatic function panel    CBC with Differential/Platelet    Lipid panel    Microalbumin / creatinine urine ratio    TSH  4. Essential hypertension  U13 Basic metabolic panel    Hemoglobin A1c    Hepatic function panel    CBC with Differential/Platelet    Lipid panel    Microalbumin / creatinine urine ratio    TSH  5. Diabetes mellitus without complication (HCC)  K44.0 Basic metabolic panel    Hemoglobin A1c    Hepatic function panel    CBC with Differential/Platelet    Lipid panel    Microalbumin / creatinine urine ratio  TSH  6. Hyperlipidemia, unspecified hyperlipidemia type  W10.2 Basic metabolic panel    Hemoglobin A1c    Hepatic function panel    CBC with Differential/Platelet    Lipid panel    Microalbumin / creatinine urine ratio    TSH  7. Gastroesophageal reflux disease, esophagitis presence not  specified  K21.9   8. Throat clearing  R68.89   9. Cough, persistent  R05    prob conbo of gerd and "allergy"    Patient Care Team: Panosh, Standley Brooking, MD as PCP - General [facility] Vonna Kotyk, MD (Orthopedic Surgery) Druscilla Brownie, MD (Dermatology) Clent Jacks, MD (Ophthalmology) Lowella Bandy, MD as Consulting Physician (Urology) Dr Dondra Prader chiropractor   Patient Instructions   Try flonase of nasacort  Every day for  Allergy sx   And see how works after  2 weeks  In addition to your reflux   Med .   conact [place]  Id not helping and consider adding singulair for allgery sx.  Stay on your    Acid suppression  Medicaion.   Will notify you  of labs when available.       Preventive Care 68 Years and Older, Female Preventive care refers to lifestyle choices and visits with your health care provider that can promote health and wellness. This includes:  A yearly physical exam. This is also called an annual well check.  Regular dental and eye exams.  Immunizations.  Screening for certain conditions.  Healthy lifestyle choices, such as diet and exercise. What can I expect for my preventive care visit? Physical exam Your health care provider will check:  Height and weight. These may be used to calculate body mass index (BMI), which is a measurement that tells if you are at a healthy weight.  Heart rate and blood pressure.  Your skin for abnormal spots. Counseling Your health care provider may ask you questions about:  Alcohol, tobacco, and drug use.  Emotional well-being.  Home and relationship well-being.  Sexual activity.  Eating habits.  History of falls.  Memory and ability to understand (cognition).  Work and work Statistician.  Pregnancy and menstrual history. What immunizations do I need?  Influenza (flu) vaccine  This is recommended every year. Tetanus, diphtheria, and pertussis (Tdap) vaccine  You may need a Td booster every 10 years. Varicella  (chickenpox) vaccine  You may need this vaccine if you have not already been vaccinated. Zoster (shingles) vaccine  You may need this after age 53. Pneumococcal conjugate (PCV13) vaccine  One dose is recommended after age 34. Pneumococcal polysaccharide (PPSV23) vaccine  One dose is recommended after age 76. Measles, mumps, and rubella (MMR) vaccine  You may need at least one dose of MMR if you were born in 1957 or later. You may also need a second dose. Meningococcal conjugate (MenACWY) vaccine  You may need this if you have certain conditions. Hepatitis A vaccine  You may need this if you have certain conditions or if you travel or work in places where you may be exposed to hepatitis A. Hepatitis B vaccine  You may need this if you have certain conditions or if you travel or work in places where you may be exposed to hepatitis B. Haemophilus influenzae type b (Hib) vaccine  You may need this if you have certain conditions. You may receive vaccines as individual doses or as more than one vaccine together in one shot (combination vaccines). Talk with your health care provider about the risks and  benefits of combination vaccines. What tests do I need? Blood tests  Lipid and cholesterol levels. These may be checked every 5 years, or more frequently depending on your overall health.  Hepatitis C test.  Hepatitis B test. Screening  Lung cancer screening. You may have this screening every year starting at age 67 if you have a 30-pack-year history of smoking and currently smoke or have quit within the past 15 years.  Colorectal cancer screening. All adults should have this screening starting at age 44 and continuing until age 82. Your health care provider may recommend screening at age 38 if you are at increased risk. You will have tests every 1-10 years, depending on your results and the type of screening test.  Diabetes screening. This is done by checking your blood sugar  (glucose) after you have not eaten for a while (fasting). You may have this done every 1-3 years.  Mammogram. This may be done every 1-2 years. Talk with your health care provider about how often you should have regular mammograms.  BRCA-related cancer screening. This may be done if you have a family history of breast, ovarian, tubal, or peritoneal cancers. Other tests  Sexually transmitted disease (STD) testing.  Bone density scan. This is done to screen for osteoporosis. You may have this done starting at age 16. Follow these instructions at home: Eating and drinking  Eat a diet that includes fresh fruits and vegetables, whole grains, lean protein, and low-fat dairy products. Limit your intake of foods with high amounts of sugar, saturated fats, and salt.  Take vitamin and mineral supplements as recommended by your health care provider.  Do not drink alcohol if your health care provider tells you not to drink.  If you drink alcohol: ? Limit how much you have to 0-1 drink a day. ? Be aware of how much alcohol is in your drink. In the [place] one drink equals one 12 oz bottle of beer (355 mL), one 5 oz glass of wine (148 mL), or one 1 oz glass of hard liquor (44 mL). Lifestyle  Take daily care of your teeth and gums.  Stay active. Exercise for at least 30 minutes on 5 or more days each week.  Do not use any products that contain nicotine or tobacco, such as cigarettes, e-cigarettes, and chewing tobacco. If you need help quitting, ask your health care provider.  If you are sexually active, practice safe sex. Use a condom or other form of protection in order to prevent STIs (sexually transmitted infections).  Talk with your health care provider about taking a low-dose aspirin or statin. What's next?  Go to your health care provider once a year for a well check visit.  Ask your health care provider how often you should have your eyes and teeth checked.  Stay up to date on all  vaccines. This information is not intended to replace advice given to you by your health care provider. Make sure you discuss any questions you have with your health care provider. Document Released: 09/11/2015 Document Revised: 08/09/2018 Document Reviewed: 08/09/2018 Elsevier Patient Education  2020 [place] Panosh M.D.

## 2019-03-04 ENCOUNTER — Other Ambulatory Visit: Payer: Self-pay

## 2019-03-04 ENCOUNTER — Encounter: Payer: Self-pay | Admitting: Internal Medicine

## 2019-03-04 ENCOUNTER — Ambulatory Visit (INDEPENDENT_AMBULATORY_CARE_PROVIDER_SITE_OTHER): Payer: Medicare Other | Admitting: Internal Medicine

## 2019-03-04 VITALS — BP 134/66 | HR 75 | Temp 98.5°F | Ht 62.0 in | Wt 160.0 lb

## 2019-03-04 DIAGNOSIS — Z0000 Encounter for general adult medical examination without abnormal findings: Secondary | ICD-10-CM

## 2019-03-04 DIAGNOSIS — K219 Gastro-esophageal reflux disease without esophagitis: Secondary | ICD-10-CM

## 2019-03-04 DIAGNOSIS — E039 Hypothyroidism, unspecified: Secondary | ICD-10-CM

## 2019-03-04 DIAGNOSIS — I10 Essential (primary) hypertension: Secondary | ICD-10-CM

## 2019-03-04 DIAGNOSIS — R05 Cough: Secondary | ICD-10-CM

## 2019-03-04 DIAGNOSIS — E119 Type 2 diabetes mellitus without complications: Secondary | ICD-10-CM

## 2019-03-04 DIAGNOSIS — E785 Hyperlipidemia, unspecified: Secondary | ICD-10-CM | POA: Diagnosis not present

## 2019-03-04 DIAGNOSIS — R053 Chronic cough: Secondary | ICD-10-CM

## 2019-03-04 DIAGNOSIS — Z79899 Other long term (current) drug therapy: Secondary | ICD-10-CM | POA: Diagnosis not present

## 2019-03-04 DIAGNOSIS — R6889 Other general symptoms and signs: Secondary | ICD-10-CM

## 2019-03-04 DIAGNOSIS — R0989 Other specified symptoms and signs involving the circulatory and respiratory systems: Secondary | ICD-10-CM

## 2019-03-04 LAB — LDL CHOLESTEROL, DIRECT: Direct LDL: 101 mg/dL

## 2019-03-04 LAB — MICROALBUMIN / CREATININE URINE RATIO
Creatinine,U: 33.5 mg/dL
Microalb Creat Ratio: 23.7 mg/g (ref 0.0–30.0)
Microalb, Ur: 7.9 mg/dL — ABNORMAL HIGH (ref 0.0–1.9)

## 2019-03-04 LAB — CBC WITH DIFFERENTIAL/PLATELET
Basophils Absolute: 0.1 10*3/uL (ref 0.0–0.1)
Basophils Relative: 0.6 % (ref 0.0–3.0)
Eosinophils Absolute: 0.4 10*3/uL (ref 0.0–0.7)
Eosinophils Relative: 3.6 % (ref 0.0–5.0)
HCT: 39.2 % (ref 36.0–46.0)
Hemoglobin: 13 g/dL (ref 12.0–15.0)
Lymphocytes Relative: 29.3 % (ref 12.0–46.0)
Lymphs Abs: 3.1 10*3/uL (ref 0.7–4.0)
MCHC: 33.2 g/dL (ref 30.0–36.0)
MCV: 87.7 fl (ref 78.0–100.0)
Monocytes Absolute: 0.7 10*3/uL (ref 0.1–1.0)
Monocytes Relative: 6.7 % (ref 3.0–12.0)
Neutro Abs: 6.3 10*3/uL (ref 1.4–7.7)
Neutrophils Relative %: 59.8 % (ref 43.0–77.0)
Platelets: 329 10*3/uL (ref 150.0–400.0)
RBC: 4.47 Mil/uL (ref 3.87–5.11)
RDW: 14.2 % (ref 11.5–15.5)
WBC: 10.5 10*3/uL (ref 4.0–10.5)

## 2019-03-04 LAB — BASIC METABOLIC PANEL
BUN: 20 mg/dL (ref 6–23)
CO2: 31 mEq/L (ref 19–32)
Calcium: 10.1 mg/dL (ref 8.4–10.5)
Chloride: 99 mEq/L (ref 96–112)
Creatinine, Ser: 1.02 mg/dL (ref 0.40–1.20)
GFR: 52.82 mL/min — ABNORMAL LOW (ref >60.00)
Glucose, Bld: 116 mg/dL — ABNORMAL HIGH (ref 70–99)
Potassium: 4.2 mEq/L (ref 3.5–5.1)
Sodium: 140 mEq/L (ref 135–145)

## 2019-03-04 LAB — HEPATIC FUNCTION PANEL
ALT: 23 U/L (ref 0–35)
AST: 23 U/L (ref 0–37)
Albumin: 4.3 g/dL (ref 3.5–5.2)
Alkaline Phosphatase: 83 U/L (ref 39–117)
Bilirubin, Direct: 0.1 mg/dL (ref 0.0–0.3)
Total Bilirubin: 0.3 mg/dL (ref 0.2–1.2)
Total Protein: 6.9 g/dL (ref 6.0–8.3)

## 2019-03-04 LAB — LIPID PANEL
Cholesterol: 172 mg/dL (ref 0–200)
HDL: 45.1 mg/dL (ref >39.00)
NonHDL: 127.09
Total CHOL/HDL Ratio: 4
Triglycerides: 251 mg/dL — ABNORMAL HIGH (ref 0.0–149.0)
VLDL: 50.2 mg/dL — ABNORMAL HIGH (ref 0.0–40.0)

## 2019-03-04 LAB — TSH: TSH: 4.22 u[IU]/mL (ref 0.35–4.50)

## 2019-03-04 LAB — HEMOGLOBIN A1C: Hgb A1c MFr Bld: 6.7 % — ABNORMAL HIGH (ref 4.6–6.5)

## 2019-03-04 NOTE — Patient Instructions (Addendum)
Try flonase of nasacort  Every day for  Allergy sx   And see how works after  2 weeks  In addition to your reflux   Med .   conact [place]  Id not helping and consider adding singulair for allgery sx.  Stay on your    Acid suppression  Medicaion.   Will notify you  of labs when available.       Preventive Care 75 Years and Older, Female Preventive care refers to lifestyle choices and visits with your health care provider that can promote health and wellness. This includes:  A yearly physical exam. This is also called an annual well check.  Regular dental and eye exams.  Immunizations.  Screening for certain conditions.  Healthy lifestyle choices, such as diet and exercise. What can I expect for my preventive care visit? Physical exam Your health care provider will check:  Height and weight. These may be used to calculate body mass index (BMI), which is a measurement that tells if you are at a healthy weight.  Heart rate and blood pressure.  Your skin for abnormal spots. Counseling Your health care provider may ask you questions about:  Alcohol, tobacco, and drug use.  Emotional well-being.  Home and relationship well-being.  Sexual activity.  Eating habits.  History of falls.  Memory and ability to understand (cognition).  Work and work Statistician.  Pregnancy and menstrual history. What immunizations do I need?  Influenza (flu) vaccine  This is recommended every year. Tetanus, diphtheria, and pertussis (Tdap) vaccine  You may need a Td booster every 10 years. Varicella (chickenpox) vaccine  You may need this vaccine if you have not already been vaccinated. Zoster (shingles) vaccine  You may need this after age 13. Pneumococcal conjugate (PCV13) vaccine  One dose is recommended after age 75. Pneumococcal polysaccharide (PPSV23) vaccine  One dose is recommended after age 75. Measles, mumps, and rubella (MMR) vaccine  You may need at least one dose  of MMR if you were born in 1957 or later. You may also need a second dose. Meningococcal conjugate (MenACWY) vaccine  You may need this if you have certain conditions. Hepatitis A vaccine  You may need this if you have certain conditions or if you travel or work in places where you may be exposed to hepatitis A. Hepatitis B vaccine  You may need this if you have certain conditions or if you travel or work in places where you may be exposed to hepatitis B. Haemophilus influenzae type b (Hib) vaccine  You may need this if you have certain conditions. You may receive vaccines as individual doses or as more than one vaccine together in one shot (combination vaccines). Talk with your health care provider about the risks and benefits of combination vaccines. What tests do I need? Blood tests  Lipid and cholesterol levels. These may be checked every 5 years, or more frequently depending on your overall health.  Hepatitis C test.  Hepatitis B test. Screening  Lung cancer screening. You may have this screening every year starting at age 75 if you have a 30-pack-year history of smoking and currently smoke or have quit within the past 15 years.  Colorectal cancer screening. All adults should have this screening starting at age 75 and continuing until age 27. Your health care provider may recommend screening at age 75 if you are at increased risk. You will have tests every 1-10 years, depending on your results and the type of screening test.  Diabetes screening. This is done by checking your blood sugar (glucose) after you have not eaten for a while (fasting). You may have this done every 1-3 years.  Mammogram. This may be done every 1-2 years. Talk with your health care provider about how often you should have regular mammograms.  BRCA-related cancer screening. This may be done if you have a family history of breast, ovarian, tubal, or peritoneal cancers. Other tests  Sexually transmitted  disease (STD) testing.  Bone density scan. This is done to screen for osteoporosis. You may have this done starting at age 75 Follow these instructions at home: Eating and drinking  Eat a diet that includes fresh fruits and vegetables, whole grains, lean protein, and low-fat dairy products. Limit your intake of foods with high amounts of sugar, saturated fats, and salt.  Take vitamin and mineral supplements as recommended by your health care provider.  Do not drink alcohol if your health care provider tells you not to drink.  If you drink alcohol: ? Limit how much you have to 0-1 drink a day. ? Be aware of how much alcohol is in your drink. In the [place] one drink equals one 12 oz bottle of beer (355 mL), one 5 oz glass of wine (148 mL), or one 1 oz glass of hard liquor (44 mL). Lifestyle  Take daily care of your teeth and gums.  Stay active. Exercise for at least 30 minutes on 5 or more days each week.  Do not use any products that contain nicotine or tobacco, such as cigarettes, e-cigarettes, and chewing tobacco. If you need help quitting, ask your health care provider.  If you are sexually active, practice safe sex. Use a condom or other form of protection in order to prevent STIs (sexually transmitted infections).  Talk with your health care provider about taking a low-dose aspirin or statin. What's next?  Go to your health care provider once a year for a well check visit.  Ask your health care provider how often you should have your eyes and teeth checked.  Stay up to date on all vaccines. This information is not intended to replace advice given to you by your health care provider. Make sure you discuss any questions you have with your health care provider. Document Released: 09/11/2015 Document Revised: 08/09/2018 Document Reviewed: 08/09/2018 Elsevier Patient Education  2020 [employer].

## 2019-03-12 DIAGNOSIS — M5136 Other intervertebral disc degeneration, lumbar region: Secondary | ICD-10-CM | POA: Diagnosis not present

## 2019-03-12 DIAGNOSIS — M9903 Segmental and somatic dysfunction of lumbar region: Secondary | ICD-10-CM | POA: Diagnosis not present

## 2019-03-12 DIAGNOSIS — M9904 Segmental and somatic dysfunction of sacral region: Secondary | ICD-10-CM | POA: Diagnosis not present

## 2019-03-12 DIAGNOSIS — M9905 Segmental and somatic dysfunction of pelvic region: Secondary | ICD-10-CM | POA: Diagnosis not present

## 2019-03-20 DIAGNOSIS — M9905 Segmental and somatic dysfunction of pelvic region: Secondary | ICD-10-CM | POA: Diagnosis not present

## 2019-03-20 DIAGNOSIS — M9904 Segmental and somatic dysfunction of sacral region: Secondary | ICD-10-CM | POA: Diagnosis not present

## 2019-03-20 DIAGNOSIS — M5136 Other intervertebral disc degeneration, lumbar region: Secondary | ICD-10-CM | POA: Diagnosis not present

## 2019-03-20 DIAGNOSIS — M9903 Segmental and somatic dysfunction of lumbar region: Secondary | ICD-10-CM | POA: Diagnosis not present

## 2019-03-26 DIAGNOSIS — M9904 Segmental and somatic dysfunction of sacral region: Secondary | ICD-10-CM | POA: Diagnosis not present

## 2019-03-26 DIAGNOSIS — M5136 Other intervertebral disc degeneration, lumbar region: Secondary | ICD-10-CM | POA: Diagnosis not present

## 2019-03-26 DIAGNOSIS — M9903 Segmental and somatic dysfunction of lumbar region: Secondary | ICD-10-CM | POA: Diagnosis not present

## 2019-03-26 DIAGNOSIS — M9905 Segmental and somatic dysfunction of pelvic region: Secondary | ICD-10-CM | POA: Diagnosis not present

## 2019-03-28 DIAGNOSIS — M9905 Segmental and somatic dysfunction of pelvic region: Secondary | ICD-10-CM | POA: Diagnosis not present

## 2019-03-28 DIAGNOSIS — M9903 Segmental and somatic dysfunction of lumbar region: Secondary | ICD-10-CM | POA: Diagnosis not present

## 2019-03-28 DIAGNOSIS — M9904 Segmental and somatic dysfunction of sacral region: Secondary | ICD-10-CM | POA: Diagnosis not present

## 2019-03-28 DIAGNOSIS — M5136 Other intervertebral disc degeneration, lumbar region: Secondary | ICD-10-CM | POA: Diagnosis not present

## 2019-04-01 DIAGNOSIS — M9902 Segmental and somatic dysfunction of thoracic region: Secondary | ICD-10-CM | POA: Diagnosis not present

## 2019-04-01 DIAGNOSIS — M9903 Segmental and somatic dysfunction of lumbar region: Secondary | ICD-10-CM | POA: Diagnosis not present

## 2019-04-01 DIAGNOSIS — M5136 Other intervertebral disc degeneration, lumbar region: Secondary | ICD-10-CM | POA: Diagnosis not present

## 2019-04-01 DIAGNOSIS — M5134 Other intervertebral disc degeneration, thoracic region: Secondary | ICD-10-CM | POA: Diagnosis not present

## 2019-04-04 DIAGNOSIS — M9905 Segmental and somatic dysfunction of pelvic region: Secondary | ICD-10-CM | POA: Diagnosis not present

## 2019-04-04 DIAGNOSIS — M9903 Segmental and somatic dysfunction of lumbar region: Secondary | ICD-10-CM | POA: Diagnosis not present

## 2019-04-04 DIAGNOSIS — M9904 Segmental and somatic dysfunction of sacral region: Secondary | ICD-10-CM | POA: Diagnosis not present

## 2019-04-04 DIAGNOSIS — M5136 Other intervertebral disc degeneration, lumbar region: Secondary | ICD-10-CM | POA: Diagnosis not present

## 2019-04-08 ENCOUNTER — Other Ambulatory Visit: Payer: Self-pay | Admitting: Internal Medicine

## 2019-04-09 DIAGNOSIS — M9903 Segmental and somatic dysfunction of lumbar region: Secondary | ICD-10-CM | POA: Diagnosis not present

## 2019-04-09 DIAGNOSIS — M5136 Other intervertebral disc degeneration, lumbar region: Secondary | ICD-10-CM | POA: Diagnosis not present

## 2019-04-09 DIAGNOSIS — M9904 Segmental and somatic dysfunction of sacral region: Secondary | ICD-10-CM | POA: Diagnosis not present

## 2019-04-09 DIAGNOSIS — M9905 Segmental and somatic dysfunction of pelvic region: Secondary | ICD-10-CM | POA: Diagnosis not present

## 2019-04-11 DIAGNOSIS — M5136 Other intervertebral disc degeneration, lumbar region: Secondary | ICD-10-CM | POA: Diagnosis not present

## 2019-04-11 DIAGNOSIS — M9904 Segmental and somatic dysfunction of sacral region: Secondary | ICD-10-CM | POA: Diagnosis not present

## 2019-04-11 DIAGNOSIS — M9905 Segmental and somatic dysfunction of pelvic region: Secondary | ICD-10-CM | POA: Diagnosis not present

## 2019-04-11 DIAGNOSIS — M9903 Segmental and somatic dysfunction of lumbar region: Secondary | ICD-10-CM | POA: Diagnosis not present

## 2019-04-15 DIAGNOSIS — M9905 Segmental and somatic dysfunction of pelvic region: Secondary | ICD-10-CM | POA: Diagnosis not present

## 2019-04-15 DIAGNOSIS — M9903 Segmental and somatic dysfunction of lumbar region: Secondary | ICD-10-CM | POA: Diagnosis not present

## 2019-04-15 DIAGNOSIS — M5136 Other intervertebral disc degeneration, lumbar region: Secondary | ICD-10-CM | POA: Diagnosis not present

## 2019-04-15 DIAGNOSIS — M9904 Segmental and somatic dysfunction of sacral region: Secondary | ICD-10-CM | POA: Diagnosis not present

## 2019-04-17 ENCOUNTER — Other Ambulatory Visit: Payer: Self-pay

## 2019-04-17 NOTE — Patient Outreach (Signed)
[place] [facility]) Care Management  04/17/2019  [name] 09/03/1945 [id number]   Medication Adherence call to Kerry Walters Compliant Voice message left with a call back number. Mrs. Killingsworth is showing past due on Atorvastatin 20 mg under [place].   [place] Management Direct Dial [phone]  Fax [phone] [email or web address]

## 2019-04-18 DIAGNOSIS — M9903 Segmental and somatic dysfunction of lumbar region: Secondary | ICD-10-CM | POA: Diagnosis not present

## 2019-04-18 DIAGNOSIS — M9905 Segmental and somatic dysfunction of pelvic region: Secondary | ICD-10-CM | POA: Diagnosis not present

## 2019-04-18 DIAGNOSIS — M9904 Segmental and somatic dysfunction of sacral region: Secondary | ICD-10-CM | POA: Diagnosis not present

## 2019-04-18 DIAGNOSIS — M5136 Other intervertebral disc degeneration, lumbar region: Secondary | ICD-10-CM | POA: Diagnosis not present

## 2019-04-22 DIAGNOSIS — M9903 Segmental and somatic dysfunction of lumbar region: Secondary | ICD-10-CM | POA: Diagnosis not present

## 2019-04-22 DIAGNOSIS — M9905 Segmental and somatic dysfunction of pelvic region: Secondary | ICD-10-CM | POA: Diagnosis not present

## 2019-04-22 DIAGNOSIS — M5136 Other intervertebral disc degeneration, lumbar region: Secondary | ICD-10-CM | POA: Diagnosis not present

## 2019-04-22 DIAGNOSIS — M9904 Segmental and somatic dysfunction of sacral region: Secondary | ICD-10-CM | POA: Diagnosis not present

## 2019-04-25 DIAGNOSIS — M5136 Other intervertebral disc degeneration, lumbar region: Secondary | ICD-10-CM | POA: Diagnosis not present

## 2019-04-25 DIAGNOSIS — M9905 Segmental and somatic dysfunction of pelvic region: Secondary | ICD-10-CM | POA: Diagnosis not present

## 2019-04-25 DIAGNOSIS — M9904 Segmental and somatic dysfunction of sacral region: Secondary | ICD-10-CM | POA: Diagnosis not present

## 2019-04-25 DIAGNOSIS — M9903 Segmental and somatic dysfunction of lumbar region: Secondary | ICD-10-CM | POA: Diagnosis not present

## 2019-04-29 DIAGNOSIS — M5136 Other intervertebral disc degeneration, lumbar region: Secondary | ICD-10-CM | POA: Diagnosis not present

## 2019-04-29 DIAGNOSIS — M9904 Segmental and somatic dysfunction of sacral region: Secondary | ICD-10-CM | POA: Diagnosis not present

## 2019-04-29 DIAGNOSIS — M9903 Segmental and somatic dysfunction of lumbar region: Secondary | ICD-10-CM | POA: Diagnosis not present

## 2019-04-29 DIAGNOSIS — M9905 Segmental and somatic dysfunction of pelvic region: Secondary | ICD-10-CM | POA: Diagnosis not present

## 2019-05-02 DIAGNOSIS — M9903 Segmental and somatic dysfunction of lumbar region: Secondary | ICD-10-CM | POA: Diagnosis not present

## 2019-05-02 DIAGNOSIS — M5136 Other intervertebral disc degeneration, lumbar region: Secondary | ICD-10-CM | POA: Diagnosis not present

## 2019-05-02 DIAGNOSIS — M9905 Segmental and somatic dysfunction of pelvic region: Secondary | ICD-10-CM | POA: Diagnosis not present

## 2019-05-02 DIAGNOSIS — M9904 Segmental and somatic dysfunction of sacral region: Secondary | ICD-10-CM | POA: Diagnosis not present

## 2019-05-07 DIAGNOSIS — M9903 Segmental and somatic dysfunction of lumbar region: Secondary | ICD-10-CM | POA: Diagnosis not present

## 2019-05-07 DIAGNOSIS — M9905 Segmental and somatic dysfunction of pelvic region: Secondary | ICD-10-CM | POA: Diagnosis not present

## 2019-05-07 DIAGNOSIS — M5136 Other intervertebral disc degeneration, lumbar region: Secondary | ICD-10-CM | POA: Diagnosis not present

## 2019-05-07 DIAGNOSIS — M9904 Segmental and somatic dysfunction of sacral region: Secondary | ICD-10-CM | POA: Diagnosis not present

## 2019-05-09 DIAGNOSIS — M9905 Segmental and somatic dysfunction of pelvic region: Secondary | ICD-10-CM | POA: Diagnosis not present

## 2019-05-09 DIAGNOSIS — M9904 Segmental and somatic dysfunction of sacral region: Secondary | ICD-10-CM | POA: Diagnosis not present

## 2019-05-09 DIAGNOSIS — M9903 Segmental and somatic dysfunction of lumbar region: Secondary | ICD-10-CM | POA: Diagnosis not present

## 2019-05-09 DIAGNOSIS — M5136 Other intervertebral disc degeneration, lumbar region: Secondary | ICD-10-CM | POA: Diagnosis not present

## 2019-05-13 DIAGNOSIS — M5136 Other intervertebral disc degeneration, lumbar region: Secondary | ICD-10-CM | POA: Diagnosis not present

## 2019-05-13 DIAGNOSIS — M9905 Segmental and somatic dysfunction of pelvic region: Secondary | ICD-10-CM | POA: Diagnosis not present

## 2019-05-13 DIAGNOSIS — M9903 Segmental and somatic dysfunction of lumbar region: Secondary | ICD-10-CM | POA: Diagnosis not present

## 2019-05-13 DIAGNOSIS — M9904 Segmental and somatic dysfunction of sacral region: Secondary | ICD-10-CM | POA: Diagnosis not present

## 2019-05-16 DIAGNOSIS — M9905 Segmental and somatic dysfunction of pelvic region: Secondary | ICD-10-CM | POA: Diagnosis not present

## 2019-05-16 DIAGNOSIS — M5136 Other intervertebral disc degeneration, lumbar region: Secondary | ICD-10-CM | POA: Diagnosis not present

## 2019-05-16 DIAGNOSIS — M9903 Segmental and somatic dysfunction of lumbar region: Secondary | ICD-10-CM | POA: Diagnosis not present

## 2019-05-16 DIAGNOSIS — M9904 Segmental and somatic dysfunction of sacral region: Secondary | ICD-10-CM | POA: Diagnosis not present

## 2019-05-20 DIAGNOSIS — M9903 Segmental and somatic dysfunction of lumbar region: Secondary | ICD-10-CM | POA: Diagnosis not present

## 2019-05-20 DIAGNOSIS — M5136 Other intervertebral disc degeneration, lumbar region: Secondary | ICD-10-CM | POA: Diagnosis not present

## 2019-05-20 DIAGNOSIS — M9905 Segmental and somatic dysfunction of pelvic region: Secondary | ICD-10-CM | POA: Diagnosis not present

## 2019-05-20 DIAGNOSIS — M9904 Segmental and somatic dysfunction of sacral region: Secondary | ICD-10-CM | POA: Diagnosis not present

## 2019-05-23 DIAGNOSIS — M9904 Segmental and somatic dysfunction of sacral region: Secondary | ICD-10-CM | POA: Diagnosis not present

## 2019-05-23 DIAGNOSIS — M9905 Segmental and somatic dysfunction of pelvic region: Secondary | ICD-10-CM | POA: Diagnosis not present

## 2019-05-23 DIAGNOSIS — M5136 Other intervertebral disc degeneration, lumbar region: Secondary | ICD-10-CM | POA: Diagnosis not present

## 2019-05-23 DIAGNOSIS — M9903 Segmental and somatic dysfunction of lumbar region: Secondary | ICD-10-CM | POA: Diagnosis not present

## 2019-05-27 DIAGNOSIS — M9903 Segmental and somatic dysfunction of lumbar region: Secondary | ICD-10-CM | POA: Diagnosis not present

## 2019-05-27 DIAGNOSIS — M5136 Other intervertebral disc degeneration, lumbar region: Secondary | ICD-10-CM | POA: Diagnosis not present

## 2019-05-27 DIAGNOSIS — M9905 Segmental and somatic dysfunction of pelvic region: Secondary | ICD-10-CM | POA: Diagnosis not present

## 2019-05-27 DIAGNOSIS — M9904 Segmental and somatic dysfunction of sacral region: Secondary | ICD-10-CM | POA: Diagnosis not present

## 2019-05-29 DIAGNOSIS — M9904 Segmental and somatic dysfunction of sacral region: Secondary | ICD-10-CM | POA: Diagnosis not present

## 2019-05-29 DIAGNOSIS — M9903 Segmental and somatic dysfunction of lumbar region: Secondary | ICD-10-CM | POA: Diagnosis not present

## 2019-05-29 DIAGNOSIS — M5136 Other intervertebral disc degeneration, lumbar region: Secondary | ICD-10-CM | POA: Diagnosis not present

## 2019-05-29 DIAGNOSIS — M9905 Segmental and somatic dysfunction of pelvic region: Secondary | ICD-10-CM | POA: Diagnosis not present

## 2019-06-03 DIAGNOSIS — M9903 Segmental and somatic dysfunction of lumbar region: Secondary | ICD-10-CM | POA: Diagnosis not present

## 2019-06-03 DIAGNOSIS — M5136 Other intervertebral disc degeneration, lumbar region: Secondary | ICD-10-CM | POA: Diagnosis not present

## 2019-06-03 DIAGNOSIS — M9904 Segmental and somatic dysfunction of sacral region: Secondary | ICD-10-CM | POA: Diagnosis not present

## 2019-06-03 DIAGNOSIS — M9905 Segmental and somatic dysfunction of pelvic region: Secondary | ICD-10-CM | POA: Diagnosis not present

## 2019-06-05 ENCOUNTER — Other Ambulatory Visit: Payer: Self-pay | Admitting: Internal Medicine

## 2019-06-06 DIAGNOSIS — M9904 Segmental and somatic dysfunction of sacral region: Secondary | ICD-10-CM | POA: Diagnosis not present

## 2019-06-06 DIAGNOSIS — M9903 Segmental and somatic dysfunction of lumbar region: Secondary | ICD-10-CM | POA: Diagnosis not present

## 2019-06-06 DIAGNOSIS — M9905 Segmental and somatic dysfunction of pelvic region: Secondary | ICD-10-CM | POA: Diagnosis not present

## 2019-06-06 DIAGNOSIS — M5136 Other intervertebral disc degeneration, lumbar region: Secondary | ICD-10-CM | POA: Diagnosis not present

## 2019-06-10 DIAGNOSIS — M9905 Segmental and somatic dysfunction of pelvic region: Secondary | ICD-10-CM | POA: Diagnosis not present

## 2019-06-10 DIAGNOSIS — M5136 Other intervertebral disc degeneration, lumbar region: Secondary | ICD-10-CM | POA: Diagnosis not present

## 2019-06-10 DIAGNOSIS — M9903 Segmental and somatic dysfunction of lumbar region: Secondary | ICD-10-CM | POA: Diagnosis not present

## 2019-06-10 DIAGNOSIS — M9904 Segmental and somatic dysfunction of sacral region: Secondary | ICD-10-CM | POA: Diagnosis not present

## 2019-06-13 DIAGNOSIS — M5136 Other intervertebral disc degeneration, lumbar region: Secondary | ICD-10-CM | POA: Diagnosis not present

## 2019-06-13 DIAGNOSIS — M9903 Segmental and somatic dysfunction of lumbar region: Secondary | ICD-10-CM | POA: Diagnosis not present

## 2019-06-13 DIAGNOSIS — M9905 Segmental and somatic dysfunction of pelvic region: Secondary | ICD-10-CM | POA: Diagnosis not present

## 2019-06-13 DIAGNOSIS — M9904 Segmental and somatic dysfunction of sacral region: Secondary | ICD-10-CM | POA: Diagnosis not present

## 2019-06-17 DIAGNOSIS — M9904 Segmental and somatic dysfunction of sacral region: Secondary | ICD-10-CM | POA: Diagnosis not present

## 2019-06-17 DIAGNOSIS — M9905 Segmental and somatic dysfunction of pelvic region: Secondary | ICD-10-CM | POA: Diagnosis not present

## 2019-06-17 DIAGNOSIS — M9903 Segmental and somatic dysfunction of lumbar region: Secondary | ICD-10-CM | POA: Diagnosis not present

## 2019-06-17 DIAGNOSIS — M5136 Other intervertebral disc degeneration, lumbar region: Secondary | ICD-10-CM | POA: Diagnosis not present

## 2019-06-20 DIAGNOSIS — M9903 Segmental and somatic dysfunction of lumbar region: Secondary | ICD-10-CM | POA: Diagnosis not present

## 2019-06-20 DIAGNOSIS — M9905 Segmental and somatic dysfunction of pelvic region: Secondary | ICD-10-CM | POA: Diagnosis not present

## 2019-06-20 DIAGNOSIS — M5136 Other intervertebral disc degeneration, lumbar region: Secondary | ICD-10-CM | POA: Diagnosis not present

## 2019-06-20 DIAGNOSIS — M9904 Segmental and somatic dysfunction of sacral region: Secondary | ICD-10-CM | POA: Diagnosis not present

## 2019-06-25 DIAGNOSIS — M9904 Segmental and somatic dysfunction of sacral region: Secondary | ICD-10-CM | POA: Diagnosis not present

## 2019-06-25 DIAGNOSIS — M9905 Segmental and somatic dysfunction of pelvic region: Secondary | ICD-10-CM | POA: Diagnosis not present

## 2019-06-25 DIAGNOSIS — M5136 Other intervertebral disc degeneration, lumbar region: Secondary | ICD-10-CM | POA: Diagnosis not present

## 2019-06-25 DIAGNOSIS — M9903 Segmental and somatic dysfunction of lumbar region: Secondary | ICD-10-CM | POA: Diagnosis not present

## 2019-07-02 DIAGNOSIS — M9905 Segmental and somatic dysfunction of pelvic region: Secondary | ICD-10-CM | POA: Diagnosis not present

## 2019-07-02 DIAGNOSIS — M9904 Segmental and somatic dysfunction of sacral region: Secondary | ICD-10-CM | POA: Diagnosis not present

## 2019-07-02 DIAGNOSIS — M5136 Other intervertebral disc degeneration, lumbar region: Secondary | ICD-10-CM | POA: Diagnosis not present

## 2019-07-02 DIAGNOSIS — M9903 Segmental and somatic dysfunction of lumbar region: Secondary | ICD-10-CM | POA: Diagnosis not present

## 2019-07-03 DIAGNOSIS — Z1231 Encounter for screening mammogram for malignant neoplasm of breast: Secondary | ICD-10-CM | POA: Diagnosis not present

## 2019-07-03 LAB — HM MAMMOGRAPHY

## 2019-07-09 DIAGNOSIS — M9904 Segmental and somatic dysfunction of sacral region: Secondary | ICD-10-CM | POA: Diagnosis not present

## 2019-07-09 DIAGNOSIS — M9905 Segmental and somatic dysfunction of pelvic region: Secondary | ICD-10-CM | POA: Diagnosis not present

## 2019-07-09 DIAGNOSIS — M5136 Other intervertebral disc degeneration, lumbar region: Secondary | ICD-10-CM | POA: Diagnosis not present

## 2019-07-09 DIAGNOSIS — M9903 Segmental and somatic dysfunction of lumbar region: Secondary | ICD-10-CM | POA: Diagnosis not present

## 2019-07-16 DIAGNOSIS — M9905 Segmental and somatic dysfunction of pelvic region: Secondary | ICD-10-CM | POA: Diagnosis not present

## 2019-07-16 DIAGNOSIS — M9904 Segmental and somatic dysfunction of sacral region: Secondary | ICD-10-CM | POA: Diagnosis not present

## 2019-07-16 DIAGNOSIS — M9903 Segmental and somatic dysfunction of lumbar region: Secondary | ICD-10-CM | POA: Diagnosis not present

## 2019-07-16 DIAGNOSIS — M5136 Other intervertebral disc degeneration, lumbar region: Secondary | ICD-10-CM | POA: Diagnosis not present

## 2019-07-29 ENCOUNTER — Other Ambulatory Visit: Payer: Self-pay

## 2019-07-29 NOTE — Patient Outreach (Signed)
[place] [facility]) Care Management  07/29/2019  Taneeka Curtner 14-Oct-1945 [id number]   Medication Adherence call to Mrs. Missy Sabins [facility] [employer] Verify spoke with patient she is past due on Atorvastatin 20 mg,patient explain she was only taking 1/2 tablet for about 3 month,but doctor wants her to start taking 1 tablet daily now.patient has medication for about 15 more days.Mrs. Blizard is showing past due under [place].   [place] Management Direct Dial [phone]  Fax [phone] [email or web address]

## 2019-07-30 DIAGNOSIS — M5136 Other intervertebral disc degeneration, lumbar region: Secondary | ICD-10-CM | POA: Diagnosis not present

## 2019-07-30 DIAGNOSIS — M9903 Segmental and somatic dysfunction of lumbar region: Secondary | ICD-10-CM | POA: Diagnosis not present

## 2019-07-30 DIAGNOSIS — M9905 Segmental and somatic dysfunction of pelvic region: Secondary | ICD-10-CM | POA: Diagnosis not present

## 2019-07-30 DIAGNOSIS — M9904 Segmental and somatic dysfunction of sacral region: Secondary | ICD-10-CM | POA: Diagnosis not present

## 2019-07-31 ENCOUNTER — Other Ambulatory Visit: Payer: Self-pay

## 2019-07-31 DIAGNOSIS — Z20822 Contact with and (suspected) exposure to covid-19: Secondary | ICD-10-CM

## 2019-08-03 LAB — NOVEL CORONAVIRUS, NAA: SARS-CoV-2, NAA: NOT DETECTED

## 2019-08-13 ENCOUNTER — Encounter: Payer: Self-pay | Admitting: Internal Medicine

## 2019-08-20 ENCOUNTER — Ambulatory Visit: Payer: Medicare Other | Attending: Internal Medicine

## 2019-08-20 ENCOUNTER — Other Ambulatory Visit: Payer: Self-pay

## 2019-08-20 DIAGNOSIS — Z20822 Contact with and (suspected) exposure to covid-19: Secondary | ICD-10-CM

## 2019-08-22 LAB — NOVEL CORONAVIRUS, NAA: SARS-CoV-2, NAA: DETECTED — AB

## 2019-09-04 ENCOUNTER — Other Ambulatory Visit: Payer: Self-pay

## 2019-09-04 ENCOUNTER — Ambulatory Visit (INDEPENDENT_AMBULATORY_CARE_PROVIDER_SITE_OTHER): Payer: Medicare Other | Admitting: Internal Medicine

## 2019-09-04 ENCOUNTER — Encounter: Payer: Self-pay | Admitting: Internal Medicine

## 2019-09-04 VITALS — BP 124/62 | HR 75 | Temp 97.7°F | Ht 62.0 in | Wt 156.4 lb

## 2019-09-04 DIAGNOSIS — Z8616 Personal history of COVID-19: Secondary | ICD-10-CM

## 2019-09-04 DIAGNOSIS — E119 Type 2 diabetes mellitus without complications: Secondary | ICD-10-CM

## 2019-09-04 DIAGNOSIS — Z79899 Other long term (current) drug therapy: Secondary | ICD-10-CM

## 2019-09-04 DIAGNOSIS — I10 Essential (primary) hypertension: Secondary | ICD-10-CM

## 2019-09-04 DIAGNOSIS — E785 Hyperlipidemia, unspecified: Secondary | ICD-10-CM

## 2019-09-04 DIAGNOSIS — E039 Hypothyroidism, unspecified: Secondary | ICD-10-CM | POA: Diagnosis not present

## 2019-09-04 LAB — POCT GLYCOSYLATED HEMOGLOBIN (HGB A1C): Hemoglobin A1C: 6.6 % — AB (ref 4.0–5.6)

## 2019-09-04 MED ORDER — FLUOCINONIDE-E 0.05 % EX CREA: 1.0000 "application " | TOPICAL_CREAM | Freq: Two times a day (BID) | CUTANEOUS | 1 refills | Status: DC

## 2019-09-04 MED ORDER — PREMARIN 0.625 MG/GM VA CREA: 1.0000 | TOPICAL_CREAM | VAGINAL | 2 refills | Status: DC

## 2019-09-04 NOTE — Progress Notes (Addendum)
Chief Complaint  Patient presents with  . Follow-up    Pt states se is here for 8month follow up    This visit occurred during the SARS-CoV-2 public health emergency.  Safety protocols were in place, including screening questions prior to the visit, additional usage of staff PPE, and extensive cleaning of exam room while observing appropriate contact time as indicated for disinfecting solutions.    HPI: Kerry Walters 74 y.o. come in for Chronic disease management  Had positive  covid 19 tests on 12 22    No fever Diarrhea   But some  sweats and chills  and hacking cough .   Little tired  But went out yesterday  Ok. Felt  Like log on chest.    Now 20+ days out and doing well a bit tired at times  Mild residula nausea at times but a lot better  Husband  got it first.   Both healing.  Not sure where got  It.  Bp had been good  Last  cpx July 2020 Needs refill of  Estrogen  Asks if can rx lidex as needed for there intermittent itchy red rash  Her dermatologist  reitred  Also uses for   PI  As needed  ROS: See pertinent positives and negatives per HPI. Has asthma allergies but no sob   Past Medical History:  Diagnosis Date  . Asthma due to environmental allergies    no inhaler  . Diverticulosis of colon   . GERD (gastroesophageal reflux disease)   . History of esophageal dilatation    FOR STRICTURE IN 2005  . History of hiatal hernia   . History of hypercalcemia    SECONDARY TO HYPERPARATHYROID  . History of hyperparathyroidism    PRIMARY--  S/P RIGHT PARATHYROIDECTOMY  . History of kidney stones   . History of recurrent UTIs   . Hyperlipidemia   . Hypertension   . Hypothyroidism   . Incomplete right bundle branch block   . Nephrolithiasis    RIGHT   . Nocturia   . PONV (postoperative nausea and vomiting)   . Pre-diabetes   . Sciatica of right side     Family History  Problem Relation Age of Onset  . Cancer Son        liver  . Cancer Father        brain  . Diabetes  Sister   . Schizophrenia Son       Outpatient Medications Prior to Visit  Medication Sig Dispense Refill  . atorvastatin (LIPITOR) 20 MG tablet Take 1 tablet (20 mg total) by mouth every evening. 90 tablet 3  . gabapentin (NEURONTIN) 100 MG capsule TAKE 1-3 CAPSULES BY MOUTH  AT BEDTIME AS DIRECTED 270 capsule 3  . levothyroxine (SYNTHROID) 75 MCG tablet TAKE 1 TABLET BY MOUTH  DAILY 90 tablet 3  . metFORMIN (GLUCOPHAGE-XR) 500 MG 24 hr tablet TAKE 3 TABLETS BY MOUTH  DAILY 270 tablet 3  . pantoprazole (PROTONIX) 40 MG tablet TAKE 1 TABLET BY MOUTH TWO  TIMES DAILY 180 tablet 2  . valsartan-hydrochlorothiazide (DIOVAN-HCT) 160-25 MG tablet TAKE 1 TABLET BY MOUTH  DAILY 90 tablet 2  . conjugated estrogens (PREMARIN) vaginal cream Place 1 Applicatorful vaginally as directed. Use 2-3 times weekly  prn 42.5 g 1   Facility-Administered Medications Prior to Visit  Medication Dose Route Frequency Provider Last Rate Last Admin  . betamethasone acetate-betamethasone sodium phosphate (CELESTONE) injection 3 mg  3 mg Intramuscular Once  Felecia Shelling, DPM      . betamethasone acetate-betamethasone sodium phosphate (CELESTONE) injection 3 mg  3 mg Intramuscular Once Evans, Brent M, DPM         EXAM:  BP 124/62 (BP Location: Right Arm, Patient Position: Sitting, Cuff Size: Normal)   Pulse 75   Temp 97.7 F (36.5 C) (Temporal)   Ht 5\' 2"  (1.575 m)   Wt 156 lb 6.4 oz (70.9 kg)   SpO2 97%   BMI 28.61 kg/m   Body mass index is 28.61 kg/m.  GENERAL: vitals reviewed and listed above, alert, oriented, appears well hydrated and in no acute distress HEENT: atraumatic, conjunctiva  clear, no obvious abnormalities on inspection of external nose and ears OP masked  NECK: no obvious masses on inspection palpation  LUNGS: clear to auscultation bilaterally, no wheezes, rales or rhonchi, good air movement CV: HRRR, no clubbing cyanosis or  peripheral edema nl cap refill  MS: moves all extremities  without noticeable focal  abnormality PSYCH: pleasant and cooperative, no obvious depression or anxiety Lab Results  Component Value Date   WBC 10.5 03/04/2019   HGB 13.0 03/04/2019   HCT 39.2 03/04/2019   PLT 329.0 03/04/2019   GLUCOSE 116 (H) 03/04/2019   CHOL 172 03/04/2019   TRIG 251.0 (H) 03/04/2019   HDL 45.10 03/04/2019   LDLDIRECT 101.0 03/04/2019   LDLCALC 88 03/19/2018   ALT 23 03/04/2019   AST 23 03/04/2019   NA 140 03/04/2019   K 4.2 03/04/2019   CL 99 03/04/2019   CREATININE 1.02 03/04/2019   BUN 20 03/04/2019   CO2 31 03/04/2019   TSH 4.22 03/04/2019   INR 1.12 11/19/2009   HGBA1C 6.6 (A) 09/04/2019   MICROALBUR 7.9 (H) 03/04/2019   BP Readings from Last 3 Encounters:  09/04/19 124/62  03/04/19 134/66  07/02/18 (!) 142/86   Wt Readings from Last 3 Encounters:  09/04/19 156 lb 6.4 oz (70.9 kg)  03/04/19 160 lb (72.6 kg)  07/02/18 163 lb 3.2 oz (74 kg)     ASSESSMENT AND PLAN:  Discussed the following assessment and plan:  Diabetes mellitus without complication (HCC) - Plan: POC HgB A1c, Basic metabolic panel, CBC with Differential, Hemoglobin A1c, Hepatic function panel, Lipid panel, TSH, Microalbumin / creatinine urine ratio  Medication management - Plan: Basic metabolic panel, CBC with Differential, Hemoglobin A1c, Hepatic function panel, Lipid panel, TSH, Microalbumin / creatinine urine ratio  History of 2019 novel coronavirus disease (COVID-19) - convaslescing seems to be doing well  still advise vaccine when avaiable,  Hyperlipidemia, unspecified hyperlipidemia type - Plan: Basic metabolic panel, CBC with Differential, Hemoglobin A1c, Hepatic function panel, Lipid panel, TSH, Microalbumin / creatinine urine ratio  Hypothyroidism, unspecified type - Plan: Basic metabolic panel, CBC with Differential, Hemoglobin A1c, Hepatic function panel, Lipid panel, TSH, Microalbumin / creatinine urine ratio  Essential hypertension - Plan: Basic metabolic  panel, CBC with Differential, Hemoglobin A1c, Hepatic function panel, Lipid panel, TSH, Microalbumin / creatinine urine ratio Following BG    bp etc    Plan cpx  and lab in 6 months or as  needed  -Patient advised to return or notify health care team  if  new concerns arise.  Patient Instructions  Glad you are doing better  You are still eligible for covid vaccine   Could wait  Till 60 - 90 days out.   Continue lifestyle intervention healthy eating and exercise .  Plan cpx and labs in about 6 months  or as needed .      Outside external source  DATA REVIEWED:   Covid info  In epic    Total time on date  of service including record review ordering and plan of care:  Fu and counseling   31 minutes    Neta Mends.  M.D.

## 2019-09-04 NOTE — Patient Instructions (Addendum)
Glad you are doing better  You are still eligible for covid vaccine   Could wait  Till 60 - 90 days out.   Continue lifestyle intervention healthy eating and exercise .  Plan cpx and labs in about 6 months or as needed .

## 2019-09-05 DIAGNOSIS — M9905 Segmental and somatic dysfunction of pelvic region: Secondary | ICD-10-CM | POA: Diagnosis not present

## 2019-09-05 DIAGNOSIS — M9903 Segmental and somatic dysfunction of lumbar region: Secondary | ICD-10-CM | POA: Diagnosis not present

## 2019-09-05 DIAGNOSIS — M5136 Other intervertebral disc degeneration, lumbar region: Secondary | ICD-10-CM | POA: Diagnosis not present

## 2019-09-05 DIAGNOSIS — M9904 Segmental and somatic dysfunction of sacral region: Secondary | ICD-10-CM | POA: Diagnosis not present

## 2019-09-10 DIAGNOSIS — M5136 Other intervertebral disc degeneration, lumbar region: Secondary | ICD-10-CM | POA: Diagnosis not present

## 2019-09-10 DIAGNOSIS — M9904 Segmental and somatic dysfunction of sacral region: Secondary | ICD-10-CM | POA: Diagnosis not present

## 2019-09-10 DIAGNOSIS — M9903 Segmental and somatic dysfunction of lumbar region: Secondary | ICD-10-CM | POA: Diagnosis not present

## 2019-09-10 DIAGNOSIS — M9905 Segmental and somatic dysfunction of pelvic region: Secondary | ICD-10-CM | POA: Diagnosis not present

## 2019-09-25 DIAGNOSIS — M9903 Segmental and somatic dysfunction of lumbar region: Secondary | ICD-10-CM | POA: Diagnosis not present

## 2019-09-25 DIAGNOSIS — M5136 Other intervertebral disc degeneration, lumbar region: Secondary | ICD-10-CM | POA: Diagnosis not present

## 2019-09-25 DIAGNOSIS — M9902 Segmental and somatic dysfunction of thoracic region: Secondary | ICD-10-CM | POA: Diagnosis not present

## 2019-09-25 DIAGNOSIS — M5134 Other intervertebral disc degeneration, thoracic region: Secondary | ICD-10-CM | POA: Diagnosis not present

## 2019-10-01 ENCOUNTER — Telehealth: Payer: Self-pay | Admitting: Internal Medicine

## 2019-10-01 MED ORDER — PREMARIN 0.625 MG/GM VA CREA: 1.0000 | TOPICAL_CREAM | VAGINAL | 2 refills | Status: DC

## 2019-10-01 NOTE — Addendum Note (Signed)
Addended by: Raiford Simmonds R on: 10/01/2019 03:43 PM   Modules accepted: Orders

## 2019-10-01 NOTE — Telephone Encounter (Signed)
Medication has been sent in again.

## 2019-10-01 NOTE — Telephone Encounter (Signed)
Pt called in stating that she has dropped off a letter from her insurance company stating that some information was omitted for the Rx.  Pt stated that she will be out of the medication by next week.  The Rx is conjugated estrogens (PREMARIN) vaginal cream.  Pt would like to have a call back when this has been resolved.

## 2019-10-01 NOTE — Telephone Encounter (Signed)
[employer] they said they are not sure why it wouldn't go through since it went through on there side pt has been contacted and told to contact pharmacy

## 2019-10-01 NOTE — Telephone Encounter (Signed)
Pt states she just spoke with her insurance ([employer]) and they told her that they have not received anything. They also told her they only keep request for a certain amount of time so she would like another request to be sent to them.

## 2019-10-02 DIAGNOSIS — M5136 Other intervertebral disc degeneration, lumbar region: Secondary | ICD-10-CM | POA: Diagnosis not present

## 2019-10-02 DIAGNOSIS — M9902 Segmental and somatic dysfunction of thoracic region: Secondary | ICD-10-CM | POA: Diagnosis not present

## 2019-10-02 DIAGNOSIS — M9903 Segmental and somatic dysfunction of lumbar region: Secondary | ICD-10-CM | POA: Diagnosis not present

## 2019-10-02 DIAGNOSIS — M5134 Other intervertebral disc degeneration, thoracic region: Secondary | ICD-10-CM | POA: Diagnosis not present

## 2019-10-07 ENCOUNTER — Other Ambulatory Visit: Payer: Self-pay | Admitting: Internal Medicine

## 2019-10-10 DIAGNOSIS — M9903 Segmental and somatic dysfunction of lumbar region: Secondary | ICD-10-CM | POA: Diagnosis not present

## 2019-10-10 DIAGNOSIS — M5134 Other intervertebral disc degeneration, thoracic region: Secondary | ICD-10-CM | POA: Diagnosis not present

## 2019-10-10 DIAGNOSIS — M5136 Other intervertebral disc degeneration, lumbar region: Secondary | ICD-10-CM | POA: Diagnosis not present

## 2019-10-10 DIAGNOSIS — M9902 Segmental and somatic dysfunction of thoracic region: Secondary | ICD-10-CM | POA: Diagnosis not present

## 2019-10-23 DIAGNOSIS — M9903 Segmental and somatic dysfunction of lumbar region: Secondary | ICD-10-CM | POA: Diagnosis not present

## 2019-10-23 DIAGNOSIS — M5136 Other intervertebral disc degeneration, lumbar region: Secondary | ICD-10-CM | POA: Diagnosis not present

## 2019-10-23 DIAGNOSIS — M5134 Other intervertebral disc degeneration, thoracic region: Secondary | ICD-10-CM | POA: Diagnosis not present

## 2019-10-23 DIAGNOSIS — M9902 Segmental and somatic dysfunction of thoracic region: Secondary | ICD-10-CM | POA: Diagnosis not present

## 2019-10-24 DIAGNOSIS — H40013 Open angle with borderline findings, low risk, bilateral: Secondary | ICD-10-CM | POA: Diagnosis not present

## 2019-10-24 DIAGNOSIS — E119 Type 2 diabetes mellitus without complications: Secondary | ICD-10-CM | POA: Diagnosis not present

## 2019-10-24 DIAGNOSIS — H2513 Age-related nuclear cataract, bilateral: Secondary | ICD-10-CM | POA: Diagnosis not present

## 2019-10-24 LAB — HM DIABETES EYE EXAM

## 2019-10-29 DIAGNOSIS — M5134 Other intervertebral disc degeneration, thoracic region: Secondary | ICD-10-CM | POA: Diagnosis not present

## 2019-10-29 DIAGNOSIS — M5136 Other intervertebral disc degeneration, lumbar region: Secondary | ICD-10-CM | POA: Diagnosis not present

## 2019-10-29 DIAGNOSIS — M9903 Segmental and somatic dysfunction of lumbar region: Secondary | ICD-10-CM | POA: Diagnosis not present

## 2019-10-29 DIAGNOSIS — M9902 Segmental and somatic dysfunction of thoracic region: Secondary | ICD-10-CM | POA: Diagnosis not present

## 2019-11-01 ENCOUNTER — Encounter: Payer: Self-pay | Admitting: Internal Medicine

## 2019-11-05 DIAGNOSIS — M5136 Other intervertebral disc degeneration, lumbar region: Secondary | ICD-10-CM | POA: Diagnosis not present

## 2019-11-05 DIAGNOSIS — M9902 Segmental and somatic dysfunction of thoracic region: Secondary | ICD-10-CM | POA: Diagnosis not present

## 2019-11-05 DIAGNOSIS — M5134 Other intervertebral disc degeneration, thoracic region: Secondary | ICD-10-CM | POA: Diagnosis not present

## 2019-11-05 DIAGNOSIS — M9903 Segmental and somatic dysfunction of lumbar region: Secondary | ICD-10-CM | POA: Diagnosis not present

## 2019-11-19 DIAGNOSIS — M9902 Segmental and somatic dysfunction of thoracic region: Secondary | ICD-10-CM | POA: Diagnosis not present

## 2019-11-19 DIAGNOSIS — M9903 Segmental and somatic dysfunction of lumbar region: Secondary | ICD-10-CM | POA: Diagnosis not present

## 2019-11-19 DIAGNOSIS — M5134 Other intervertebral disc degeneration, thoracic region: Secondary | ICD-10-CM | POA: Diagnosis not present

## 2019-11-19 DIAGNOSIS — M5136 Other intervertebral disc degeneration, lumbar region: Secondary | ICD-10-CM | POA: Diagnosis not present

## 2019-12-11 DIAGNOSIS — M5136 Other intervertebral disc degeneration, lumbar region: Secondary | ICD-10-CM | POA: Diagnosis not present

## 2019-12-11 DIAGNOSIS — M5134 Other intervertebral disc degeneration, thoracic region: Secondary | ICD-10-CM | POA: Diagnosis not present

## 2019-12-11 DIAGNOSIS — M9902 Segmental and somatic dysfunction of thoracic region: Secondary | ICD-10-CM | POA: Diagnosis not present

## 2019-12-11 DIAGNOSIS — M9903 Segmental and somatic dysfunction of lumbar region: Secondary | ICD-10-CM | POA: Diagnosis not present

## 2019-12-19 DIAGNOSIS — M9903 Segmental and somatic dysfunction of lumbar region: Secondary | ICD-10-CM | POA: Diagnosis not present

## 2019-12-19 DIAGNOSIS — M5134 Other intervertebral disc degeneration, thoracic region: Secondary | ICD-10-CM | POA: Diagnosis not present

## 2019-12-19 DIAGNOSIS — M5136 Other intervertebral disc degeneration, lumbar region: Secondary | ICD-10-CM | POA: Diagnosis not present

## 2019-12-19 DIAGNOSIS — M9902 Segmental and somatic dysfunction of thoracic region: Secondary | ICD-10-CM | POA: Diagnosis not present

## 2019-12-25 ENCOUNTER — Other Ambulatory Visit: Payer: Self-pay | Admitting: Internal Medicine

## 2019-12-26 DIAGNOSIS — M9904 Segmental and somatic dysfunction of sacral region: Secondary | ICD-10-CM | POA: Diagnosis not present

## 2019-12-26 DIAGNOSIS — M9903 Segmental and somatic dysfunction of lumbar region: Secondary | ICD-10-CM | POA: Diagnosis not present

## 2019-12-26 DIAGNOSIS — M5136 Other intervertebral disc degeneration, lumbar region: Secondary | ICD-10-CM | POA: Diagnosis not present

## 2019-12-26 DIAGNOSIS — M9905 Segmental and somatic dysfunction of pelvic region: Secondary | ICD-10-CM | POA: Diagnosis not present

## 2020-01-07 DIAGNOSIS — M5136 Other intervertebral disc degeneration, lumbar region: Secondary | ICD-10-CM | POA: Diagnosis not present

## 2020-01-07 DIAGNOSIS — M9905 Segmental and somatic dysfunction of pelvic region: Secondary | ICD-10-CM | POA: Diagnosis not present

## 2020-01-07 DIAGNOSIS — M9904 Segmental and somatic dysfunction of sacral region: Secondary | ICD-10-CM | POA: Diagnosis not present

## 2020-01-07 DIAGNOSIS — M9903 Segmental and somatic dysfunction of lumbar region: Secondary | ICD-10-CM | POA: Diagnosis not present

## 2020-01-09 ENCOUNTER — Telehealth: Payer: Self-pay | Admitting: Internal Medicine

## 2020-01-09 NOTE — Chronic Care Management (AMB) (Signed)
  Chronic Care Management   Outreach Note  01/09/2020 Name: Kerry Walters MRN: [id number] DOB: 1946-02-08  Referred by: Madelin Headings, MD Reason for referral : Chronic Care Management   An unsuccessful telephone outreach was attempted today. The patient was referred to the pharmacist for assistance with care management and care coordination.   Follow Up Plan:   Kerry Walters  [facility] Scheduler

## 2020-01-13 DIAGNOSIS — M5136 Other intervertebral disc degeneration, lumbar region: Secondary | ICD-10-CM | POA: Diagnosis not present

## 2020-01-13 DIAGNOSIS — M9904 Segmental and somatic dysfunction of sacral region: Secondary | ICD-10-CM | POA: Diagnosis not present

## 2020-01-13 DIAGNOSIS — M9905 Segmental and somatic dysfunction of pelvic region: Secondary | ICD-10-CM | POA: Diagnosis not present

## 2020-01-13 DIAGNOSIS — M9903 Segmental and somatic dysfunction of lumbar region: Secondary | ICD-10-CM | POA: Diagnosis not present

## 2020-01-29 ENCOUNTER — Telehealth: Payer: Self-pay | Admitting: Internal Medicine

## 2020-01-29 DIAGNOSIS — M9903 Segmental and somatic dysfunction of lumbar region: Secondary | ICD-10-CM | POA: Diagnosis not present

## 2020-01-29 DIAGNOSIS — M9904 Segmental and somatic dysfunction of sacral region: Secondary | ICD-10-CM | POA: Diagnosis not present

## 2020-01-29 DIAGNOSIS — M9905 Segmental and somatic dysfunction of pelvic region: Secondary | ICD-10-CM | POA: Diagnosis not present

## 2020-01-29 DIAGNOSIS — M5136 Other intervertebral disc degeneration, lumbar region: Secondary | ICD-10-CM | POA: Diagnosis not present

## 2020-01-29 NOTE — Progress Notes (Signed)
°  Chronic Care Management   Note  01/29/2020 Name: Kerry Walters MRN: [id number] DOB: Dec 09, 1945  Kerry Walters is a 74 y.o. year old female who is a primary care patient of Panosh, Neta Mends, MD. I reached out to Lucious Groves by phone today in response to a referral sent by Kerry Walters's PCP, Panosh, Neta Mends, MD.   Kerry Walters was given information about Chronic Care Management services today including:  1. CCM service includes personalized support from designated clinical staff supervised by her physician, including individualized plan of care and coordination with other care providers 2. 24/7 contact phone numbers for assistance for urgent and routine care needs. 3. Service will only be billed when office clinical staff spend 20 minutes or more in a month to coordinate care. 4. Only one practitioner may furnish and bill the service in a calendar month. 5. The patient may stop CCM services at any time (effective at the end of the month) by phone call to the office staff.   Patient agreed to services and verbal consent obtained.   This note is not being shared with the patient for the following reason: To respect privacy (The patient or proxy has requested that the information not be shared).   Follow up plan:  Alvie Heidelberg [facility] Scheduler

## 2020-02-04 ENCOUNTER — Other Ambulatory Visit: Payer: Self-pay | Admitting: Internal Medicine

## 2020-02-10 DIAGNOSIS — M9903 Segmental and somatic dysfunction of lumbar region: Secondary | ICD-10-CM | POA: Diagnosis not present

## 2020-02-10 DIAGNOSIS — M9904 Segmental and somatic dysfunction of sacral region: Secondary | ICD-10-CM | POA: Diagnosis not present

## 2020-02-10 DIAGNOSIS — M9905 Segmental and somatic dysfunction of pelvic region: Secondary | ICD-10-CM | POA: Diagnosis not present

## 2020-02-10 DIAGNOSIS — M5136 Other intervertebral disc degeneration, lumbar region: Secondary | ICD-10-CM | POA: Diagnosis not present

## 2020-02-18 DIAGNOSIS — M9903 Segmental and somatic dysfunction of lumbar region: Secondary | ICD-10-CM | POA: Diagnosis not present

## 2020-02-18 DIAGNOSIS — M5136 Other intervertebral disc degeneration, lumbar region: Secondary | ICD-10-CM | POA: Diagnosis not present

## 2020-02-18 DIAGNOSIS — M9905 Segmental and somatic dysfunction of pelvic region: Secondary | ICD-10-CM | POA: Diagnosis not present

## 2020-02-18 DIAGNOSIS — M9904 Segmental and somatic dysfunction of sacral region: Secondary | ICD-10-CM | POA: Diagnosis not present

## 2020-03-03 ENCOUNTER — Ambulatory Visit (INDEPENDENT_AMBULATORY_CARE_PROVIDER_SITE_OTHER): Payer: Medicare Other | Admitting: Internal Medicine

## 2020-03-03 ENCOUNTER — Encounter: Payer: Self-pay | Admitting: Internal Medicine

## 2020-03-03 ENCOUNTER — Other Ambulatory Visit: Payer: Self-pay

## 2020-03-03 VITALS — BP 142/86 | HR 72 | Temp 97.9°F | Ht 61.75 in | Wt 152.8 lb

## 2020-03-03 DIAGNOSIS — E039 Hypothyroidism, unspecified: Secondary | ICD-10-CM

## 2020-03-03 DIAGNOSIS — E1165 Type 2 diabetes mellitus with hyperglycemia: Secondary | ICD-10-CM

## 2020-03-03 DIAGNOSIS — Z0000 Encounter for general adult medical examination without abnormal findings: Secondary | ICD-10-CM

## 2020-03-03 DIAGNOSIS — E785 Hyperlipidemia, unspecified: Secondary | ICD-10-CM | POA: Diagnosis not present

## 2020-03-03 DIAGNOSIS — Z8616 Personal history of COVID-19: Secondary | ICD-10-CM | POA: Diagnosis not present

## 2020-03-03 DIAGNOSIS — Z79899 Other long term (current) drug therapy: Secondary | ICD-10-CM | POA: Diagnosis not present

## 2020-03-03 DIAGNOSIS — R05 Cough: Secondary | ICD-10-CM

## 2020-03-03 DIAGNOSIS — E119 Type 2 diabetes mellitus without complications: Secondary | ICD-10-CM | POA: Diagnosis not present

## 2020-03-03 DIAGNOSIS — R053 Chronic cough: Secondary | ICD-10-CM

## 2020-03-03 DIAGNOSIS — I10 Essential (primary) hypertension: Secondary | ICD-10-CM

## 2020-03-03 DIAGNOSIS — Z87442 Personal history of urinary calculi: Secondary | ICD-10-CM | POA: Diagnosis not present

## 2020-03-03 DIAGNOSIS — R252 Cramp and spasm: Secondary | ICD-10-CM

## 2020-03-03 LAB — LIPID PANEL
Cholesterol: 142 mg/dL (ref 0–200)
HDL: 46 mg/dL (ref >39.00)
LDL Cholesterol: 76 mg/dL (ref 0–99)
NonHDL: 95.72
Total CHOL/HDL Ratio: 3
Triglycerides: 100 mg/dL (ref 0.0–149.0)
VLDL: 20 mg/dL (ref 0.0–40.0)

## 2020-03-03 LAB — BASIC METABOLIC PANEL
BUN: 23 mg/dL (ref 6–23)
CO2: 32 mEq/L (ref 19–32)
Calcium: 10 mg/dL (ref 8.4–10.5)
Chloride: 102 mEq/L (ref 96–112)
Creatinine, Ser: 1.02 mg/dL (ref 0.40–1.20)
GFR: 52.68 mL/min — ABNORMAL LOW (ref >60.00)
Glucose, Bld: 127 mg/dL — ABNORMAL HIGH (ref 70–99)
Potassium: 3.8 mEq/L (ref 3.5–5.1)
Sodium: 144 mEq/L (ref 135–145)

## 2020-03-03 LAB — HEPATIC FUNCTION PANEL
ALT: 20 U/L (ref 0–35)
AST: 23 U/L (ref 0–37)
Albumin: 4.2 g/dL (ref 3.5–5.2)
Alkaline Phosphatase: 77 U/L (ref 39–117)
Bilirubin, Direct: 0.1 mg/dL (ref 0.0–0.3)
Total Bilirubin: 0.5 mg/dL (ref 0.2–1.2)
Total Protein: 6.7 g/dL (ref 6.0–8.3)

## 2020-03-03 LAB — CBC WITH DIFFERENTIAL/PLATELET
Basophils Absolute: 0 10*3/uL (ref 0.0–0.1)
Basophils Relative: 0.5 % (ref 0.0–3.0)
Eosinophils Absolute: 0.2 10*3/uL (ref 0.0–0.7)
Eosinophils Relative: 2.5 % (ref 0.0–5.0)
HCT: 37.7 % (ref 36.0–46.0)
Hemoglobin: 12.6 g/dL (ref 12.0–15.0)
Lymphocytes Relative: 29.9 % (ref 12.0–46.0)
Lymphs Abs: 2.5 10*3/uL (ref 0.7–4.0)
MCHC: 33.5 g/dL (ref 30.0–36.0)
MCV: 85.6 fl (ref 78.0–100.0)
Monocytes Absolute: 0.5 10*3/uL (ref 0.1–1.0)
Monocytes Relative: 6.7 % (ref 3.0–12.0)
Neutro Abs: 5 10*3/uL (ref 1.4–7.7)
Neutrophils Relative %: 60.4 % (ref 43.0–77.0)
Platelets: 291 10*3/uL (ref 150.0–400.0)
RBC: 4.41 Mil/uL (ref 3.87–5.11)
RDW: 15.1 % (ref 11.5–15.5)
WBC: 8.2 10*3/uL (ref 4.0–10.5)

## 2020-03-03 LAB — MAGNESIUM: Magnesium: 1.5 mg/dL (ref 1.5–2.5)

## 2020-03-03 LAB — MICROALBUMIN / CREATININE URINE RATIO
Creatinine,U: 82.8 mg/dL
Microalb Creat Ratio: 26.2 mg/g (ref 0.0–30.0)
Microalb, Ur: 21.7 mg/dL — ABNORMAL HIGH (ref 0.0–1.9)

## 2020-03-03 LAB — HEMOGLOBIN A1C: Hgb A1c MFr Bld: 6.6 % — ABNORMAL HIGH (ref 4.6–6.5)

## 2020-03-03 LAB — TSH: TSH: 4.49 u[IU]/mL (ref 0.35–4.50)

## 2020-03-03 NOTE — Progress Notes (Signed)
Chief Complaint  Patient presents with  . Annual Exam    Doing okay    HPI: Kerry Walters 74 y.o. comes in today for Preventive Medicare exam/ wellness visit .Since last visit.  Hx esoph  Stricture  And also pp sx  Taking protonic bid   Doing well    Leg cramp one foot  Right    At night   No back?    hacky cough still present with covid .    Feels like going to wheeze    Mid chest  But not . Ever since  covid .  No sob  flnase may help also   [employer]  No smoke or ets   Bg taking metformin  No se  No numbness   Sees urologist and see above  See below  Health Maintenance  Topic Date Due  . Hepatitis C Screening  Never done  . DEXA SCAN  Never done  . INFLUENZA VACCINE  03/29/2020  . HEMOGLOBIN A1C  09/03/2020  . OPHTHALMOLOGY EXAM  10/23/2020  . FOOT EXAM  03/03/2021  . TETANUS/TDAP  11/23/2024  . COVID-19 Vaccine  Completed  . PNA vac Low Risk Adult  Completed   Health Maintenance Review LIFESTYLE:  Exercise:   works outside  [employer]   Tobacco/ETS:n Alcohol: n Sugar beverages: no Sleep:  Depends  6-8   Drug use: no Supplements urologist mag and b6   fo reducing kidney stones  Co q 10 tumeric  For gout ? And cranberry pills  2 per day  HH: 2 1 pet  Doesn't like to go to doctors   Hearing:   Pretty good   Vision:  No limitations at present . Last eye check UTD  Safety:  Has smoke detector and wears seat belts.   No excess sun exposure. Sees dentist regularly.  Falls:  n  Advance directive :  Reviewed  Has one.  Memory: Felt to be good  , no concern from her or her family.  Depression: No anhedonia unusual crying or depressive symptoms  Nutrition: Eats well balanced diet; adequate calcium and vitamin D. No swallowing chewing problems.  Injury: no major injuries in the last six months.  Other healthcare providers:  Reviewed today .  Preventive parameters: up-to-date  Reviewed   ADLS:   There are no problems or need for assistance   driving, feeding, obtaining food, dressing, toileting and bathing, managing money using phone. She is independent.   ROS:  GEN/ HEENT: No fever, significant weight changes sweats headaches vision problems hearing changes, CV/ PULM; No chest pain shortness of breath cough, syncope,edema  change in exercise tolerance. GI /GU: No adominal pain, vomiting, change in bowel habits. No blood in the stool. No significant GU symptoms. SKIN/HEME: ,no acute skin rashes suspicious lesions or bleeding. No lymphadenopathy, nodules, masses.  NEURO/ PSYCH:  No neurologic signs such as weakness numbness. No depression anxiety. IMM/ Allergy: No unusual infections.  Allergy .   REST of 12 system review negative except as per HPI   Past Medical History:  Diagnosis Date  . Asthma due to environmental allergies    no inhaler  . Diverticulosis of colon   . GERD (gastroesophageal reflux disease)   . History of esophageal dilatation    FOR STRICTURE IN 2005  . History of hiatal hernia   . History of hypercalcemia    SECONDARY TO HYPERPARATHYROID  . History of hyperparathyroidism    PRIMARY--  S/P  RIGHT PARATHYROIDECTOMY  . History of kidney stones   . History of recurrent UTIs   . Hyperlipidemia   . Hypertension   . Hypothyroidism   . Incomplete right bundle branch block   . Nephrolithiasis    RIGHT   . Nocturia   . PONV (postoperative nausea and vomiting)   . Pre-diabetes   . Sciatica of right side     Family History  Problem Relation Age of Onset  . Cancer Father        brain  . Cancer Son        liver  . Diabetes Sister   . Schizophrenia Son     Social History   Socioeconomic History  . Marital status: Married    Spouse name: Not on file  . Number of children: Not on file  . Years of education: Not on file  . Highest education level: Not on file  Occupational History  . Not on file  Tobacco Use  . Smoking status: Never Smoker  . Smokeless tobacco: Never Used  Vaping Use  .  Vaping Use: Never used  Substance and Sexual Activity  . Alcohol use: No  . Drug use: No  . Sexual activity: Not on file  Other Topics Concern  . Not on file  Social History Narrative   Married   hh of 2   4 dogs   G2 P2   Water exercise.   Retired    [employer]    great grandchild          Social Determinants of Corporate investment bankerHealth   Financial Resource Strain:   . Difficulty of Paying Living Expenses:   Food Insecurity:   . Worried About Programme researcher, broadcasting/film/videounning Out of Food in the Last Year:   . Baristaan Out of Food in the Last Year:   Transportation Needs:   . Freight forwarderLack of Transportation (Medical):   [name]. Lack of Transportation (Non-Medical):   Physical Activity:   . Days of Exercise per Week:   . Minutes of Exercise per Session:   Stress:   . Feeling of Stress :   Social Connections:   . Frequency of Communication with Friends and Family:   . Frequency of Social Gatherings with Friends and Family:   . Attends Religious Services:   . Active Member of Clubs or Organizations:   . Attends BankerClub or Organization Meetings:   [name]. Marital Status:     Outpatient Encounter Medications as of 03/03/2020  Medication Sig  . atorvastatin (LIPITOR) 20 MG tablet TAKE 1 TABLET BY MOUTH IN  THE EVENING  . conjugated estrogens (PREMARIN) vaginal cream Place 1 Applicatorful vaginally as directed. Use 2-3 times weekly  prn  . fluocinonide-emollient (LIDEX-E) 0.05 % cream Apply 1 application topically 2 (two) times daily. Not on face as directed  . gabapentin (NEURONTIN) 100 MG capsule TAKE 1 TO 3 CAPSULES BY  MOUTH AT BEDTIME AS  DIRECTED  . levothyroxine (SYNTHROID) 75 MCG tablet TAKE 1 TABLET BY MOUTH  DAILY  . metFORMIN (GLUCOPHAGE-XR) 500 MG 24 hr tablet TAKE 3 TABLETS BY MOUTH  DAILY  . pantoprazole (PROTONIX) 40 MG tablet TAKE 1 TABLET BY MOUTH TWO  TIMES DAILY  . valsartan-hydrochlorothiazide (DIOVAN-HCT) 160-25 MG tablet TAKE 1 TABLET BY MOUTH  DAILY   Facility-Administered Encounter Medications as of 03/03/2020  Medication    . betamethasone acetate-betamethasone sodium phosphate (CELESTONE) injection 3 mg  . betamethasone acetate-betamethasone sodium phosphate (CELESTONE) injection 3 mg    EXAM:  BP [name](!)  142/86   Pulse 72   Temp 97.9 F (36.6 C) (Temporal)   Ht 5' 1.75" (1.568 m)   Wt 152 lb 12.8 oz (69.3 kg)   SpO2 98%   BMI 28.17 kg/m   Body mass index is 28.17 kg/m.  Physical Exam: Vital signs reviewed [id number]:WRUE is a well-developed well-nourished alert cooperative   who appears stated age in no acute distress.  HEENT: normocephalic atraumatic , Eyes: PERRL EOM's full, conjunctiva clear, Nares: paten,t no deformity discharge or tenderness., Ears: no deformity EAC's clear TMs with normal landmarks. Mouth masked NECK: supple without masses, thyromegaly or bruits. CHEST/PULM:  Clear to auscultation and percussion breath sounds equal no wheeze , rales or rhonchi. No chest wall deformities or tenderness. CV: PMI is nondisplaced, S1 S2 no gallops, murmurs, rubs. Peripheral pulses are full without delay.No JVD .  Breast: normal by inspection . No dimpling, discharge, masses, tenderness or discharge . Left nipple flipped invert but no fixed and no masses ( ok)  ABDOMEN: Bowel sounds normal nontender  No guard or rebound, no hepato splenomegal no CVA tenderness.   Extremtities:  No clubbing cyanosis or edema, no acute joint swelling or redness no focal atrophy NEURO:  Oriented x3, cranial nerves 3-12 appear to be intact, no obvious focal weakness,gait within normal limits no abnormal reflexes or asymmetrical SKIN: No acute rashes normal turgor, color, no bruising or petechiae. PSYCH: Oriented, good eye contact, no obvious depression anxiety, cognition and judgment appear normal. LN: no cervical axillary inguinal adenopathy No noted deficits in memory, attention, and speech. Diabetic Foot Exam - Simple   Simple Foot Form Diabetic Foot exam was performed with the following findings: Yes 03/03/2020  8:31 AM   Visual Inspection No deformities, no ulcerations, no other skin breakdown bilaterally: Yes Sensation Testing Intact to touch and monofilament testing bilaterally: Yes Pulse Check Posterior Tibialis and Dorsalis pulse intact bilaterally: Yes Comments      Lab Results  Component Value Date   WBC 8.2 03/03/2020   HGB 12.6 03/03/2020   HCT 37.7 03/03/2020   PLT 291.0 03/03/2020   GLUCOSE 127 (H) 03/03/2020   CHOL 142 03/03/2020   TRIG 100.0 03/03/2020   HDL 46.00 03/03/2020   LDLDIRECT 101.0 03/04/2019   LDLCALC 76 03/03/2020   ALT 20 03/03/2020   AST 23 03/03/2020   NA 144 03/03/2020   K 3.8 03/03/2020   CL 102 03/03/2020   CREATININE 1.02 03/03/2020   BUN 23 03/03/2020   CO2 32 03/03/2020   TSH 4.49 03/03/2020   INR 1.12 11/19/2009   HGBA1C 6.6 (H) 03/03/2020   MICROALBUR 21.7 (H) 03/03/2020    ASSESSMENT AND PLAN:  Discussed the following assessment and plan:  Visit for preventive health examination  Medication management - Plan: Basic metabolic panel, CBC with Differential/Platelet, Hemoglobin A1c, Hepatic function panel, Lipid panel, TSH, Magnesium, Microalbumin / creatinine urine ratio  Type 2 diabetes mellitus with hyperglycemia, without long-term current use of insulin (HCC)  Diabetes mellitus without complication (HCC) - Plan: Basic metabolic panel, CBC with Differential/Platelet, Hemoglobin A1c, Hepatic function panel, Lipid panel, TSH, Magnesium, Microalbumin / creatinine urine ratio  Cough, persistent - Plan: Basic metabolic panel, CBC with Differential/Platelet, Hemoglobin A1c, Hepatic function panel, Lipid panel, TSH, Magnesium, Microalbumin / creatinine urine ratio, DG Chest 2 View  Hyperlipidemia, unspecified hyperlipidemia type - Plan: Basic metabolic panel, CBC with Differential/Platelet, Hemoglobin A1c, Hepatic function panel, Lipid panel, TSH, Magnesium, Microalbumin / creatinine urine ratio  Hypothyroidism, unspecified type - Plan: Basic  metabolic panel, CBC with Differential/Platelet, Hemoglobin A1c, Hepatic function panel, Lipid panel, TSH, Magnesium, Microalbumin / creatinine urine ratio  Essential hypertension - up readings today  check and ensure control  - Plan: Basic metabolic panel, CBC with Differential/Platelet, Hemoglobin A1c, Hepatic function panel, Lipid panel, TSH, Magnesium, Microalbumin / creatinine urine ratio  Cramp in lower leg associated with rest  Personal history of kidney stones  Personal history of covid-19 - 2020  Patient Care Team: Madelin Headings, MD as PCP - General Cleophas Dunker Claude Manges, MD (Orthopedic Surgery) Cherlyn Roberts, MD (Dermatology) Ernesto Rutherford, MD (Ophthalmology) Su Grand, MD (Inactive) as Consulting Physician (Urology) Dr Leana Roe chiropractor  [place], Drinda Butts, [facility] as Pharmacist (Pharmacist)  Patient Instructions  Check BP readings twice a day for 3-5 days  ( 2 readings at each sitting )  . Send os readings Goal  Below 140/90  Best is 130/80 or below .   Get c [facility] radiology.  We may get   Pulmonary evaluation  For the ongoing cough post covid .  Plan  Fu depending on result of labs .     Health Maintenance, Female Adopting a healthy lifestyle and getting preventive care are important in promoting health and wellness. Ask your health care provider about:  The right schedule for you to have regular tests and exams.  Things you can do on your own to prevent diseases and keep yourself healthy. What should I know about diet, weight, and exercise? Eat a healthy diet   Eat a diet that includes plenty of vegetables, fruits, low-fat dairy products, and lean protein.  Do not eat a lot of foods that are high in solid fats, added sugars, or sodium. Maintain a healthy weight Body mass index (BMI) is used to identify weight problems. It estimates body fat based on height and weight. Your health care provider can help determine your BMI and help you achieve or  maintain a healthy weight. Get regular exercise Get regular exercise. This is one of the most important things you can do for your health. Most adults should:  Exercise for at least 150 minutes each week. The exercise should increase your heart rate and make you sweat (moderate-intensity exercise).  Do strengthening exercises at least twice a week. This is in addition to the moderate-intensity exercise.  Spend less time sitting. Even light physical activity can be beneficial. Watch cholesterol and blood lipids Have your blood tested for lipids and cholesterol at 76 years of age, then have this test every 5 years. Have your cholesterol levels checked more often if:  Your lipid or cholesterol levels are high.  You are older than 76 years of age.  You are at high risk for heart disease. What should I know about cancer screening? Depending on your health history and family history, you may need to have cancer screening at various ages. This may include screening for:  Breast cancer.  Cervical cancer.  Colorectal cancer.  Skin cancer.  Lung cancer. What should I know about heart disease, diabetes, and high blood pressure? Blood pressure and heart disease  High blood pressure causes heart disease and increases the risk of stroke. This is more likely to develop in people who have high blood pressure readings, are of African descent, or are overweight.  Have your blood pressure checked: ? Every 3-5 years if you are 23-70 years of age. ? Every year if you are 58 years old or older. Diabetes Have regular diabetes screenings. This checks  your fasting blood sugar level. Have the screening done:  Once every three years after age 34 if you are at a normal weight and have a low risk for diabetes.  More often and at a younger age if you are overweight or have a high risk for diabetes. What should I know about preventing infection? Hepatitis B If you have a higher risk for hepatitis B,  you should be screened for this virus. Talk with your health care provider to find out if you are at risk for hepatitis B infection. Hepatitis C Testing is recommended for:  Everyone born from [birth year] through 1967.  Anyone with known risk factors for hepatitis C. Sexually transmitted infections (STIs)  Get screened for STIs, including gonorrhea and chlamydia, if: ? You are sexually active and are younger than 75 years of age. ? You are older than 76 years of age and your health care provider tells you that you are at risk for this type of infection. ? Your sexual activity has changed since you were last screened, and you are at increased risk for chlamydia or gonorrhea. Ask your health care provider if you are at risk.  Ask your health care provider about whether you are at high risk for HIV. Your health care provider may recommend a prescription medicine to help prevent HIV infection. If you choose to take medicine to prevent HIV, you should first get tested for HIV. You should then be tested every 3 months for as long as you are taking the medicine. Pregnancy  If you are about to stop having your period (premenopausal) and you may become pregnant, seek counseling before you get pregnant.  Take 400 to 800 micrograms (mcg) of folic acid every day if you become pregnant.  Ask for birth control (contraception) if you want to prevent pregnancy. Osteoporosis and menopause Osteoporosis is a disease in which the bones lose minerals and strength with aging. This can result in bone fractures. If you are 59 years old or older, or if you are at risk for osteoporosis and fractures, ask your health care provider if you should:  Be screened for bone loss.  Take a calcium or vitamin D supplement to lower your risk of fractures.  Be given hormone replacement therapy (HRT) to treat symptoms of menopause. Follow these instructions at home: Lifestyle  Do not use any products that contain nicotine or  tobacco, such as cigarettes, e-cigarettes, and chewing tobacco. If you need help quitting, ask your health care provider.  Do not use street drugs.  Do not share needles.  Ask your health care provider for help if you need support or information about quitting drugs. Alcohol use  Do not drink alcohol if: ? Your health care provider tells you not to drink. ? You are pregnant, may be pregnant, or are planning to become pregnant.  If you drink alcohol: ? Limit how much you use to 0-1 drink a day. ? Limit intake if you are breastfeeding.  Be aware of how much alcohol is in your drink. In the [place] one drink equals one 12 oz bottle of beer (355 mL), one 5 oz glass of wine (148 mL), or one 1 oz glass of hard liquor (44 mL). General instructions  Schedule regular health, dental, and eye exams.  Stay current with your vaccines.  Tell your health care provider if: ? You often feel depressed. ? You have ever been abused or do not feel safe at home. Summary  Adopting a healthy  lifestyle and getting preventive care are important in promoting health and wellness.  Follow your health care provider's instructions about healthy diet, exercising, and getting tested or screened for diseases.  Follow your health care provider's instructions on monitoring your cholesterol and blood pressure. This information is not intended to replace advice given to you by your health care provider. Make sure you discuss any questions you have with your health care provider. Document Revised: 08/08/2018 Document Reviewed: 08/08/2018 Elsevier Patient Education  2020 [employer].      [place] K.  M.D.

## 2020-03-03 NOTE — Patient Instructions (Signed)
Check BP readings twice a day for 3-5 days  ( 2 readings at each sitting )  . Send os readings Goal  Below 140/90  Best is 130/80 or below .   Get c xray at [facility] radiology.  We may get   Pulmonary evaluation  For the ongoing cough post covid .  Plan  Fu depending on result of labs .     Health Maintenance, Female Adopting a healthy lifestyle and getting preventive care are important in promoting health and wellness. Ask your health care provider about:  The right schedule for you to have regular tests and exams.  Things you can do on your own to prevent diseases and keep yourself healthy. What should I know about diet, weight, and exercise? Eat a healthy diet   Eat a diet that includes plenty of vegetables, fruits, low-fat dairy products, and lean protein.  Do not eat a lot of foods that are high in solid fats, added sugars, or sodium. Maintain a healthy weight Body mass index (BMI) is used to identify weight problems. It estimates body fat based on height and weight. Your health care provider can help determine your BMI and help you achieve or maintain a healthy weight. Get regular exercise Get regular exercise. This is one of the most important things you can do for your health. Most adults should:  Exercise for at least 150 minutes each week. The exercise should increase your heart rate and make you sweat (moderate-intensity exercise).  Do strengthening exercises at least twice a week. This is in addition to the moderate-intensity exercise.  Spend less time sitting. Even light physical activity can be beneficial. Watch cholesterol and blood lipids Have your blood tested for lipids and cholesterol at 76 years of age, then have this test every 5 years. Have your cholesterol levels checked more often if:  Your lipid or cholesterol levels are high.  You are older than 76 years of age.  You are at high risk for heart disease. What should I know about cancer  screening? Depending on your health history and family history, you may need to have cancer screening at various ages. This may include screening for:  Breast cancer.  Cervical cancer.  Colorectal cancer.  Skin cancer.  Lung cancer. What should I know about heart disease, diabetes, and high blood pressure? Blood pressure and heart disease  High blood pressure causes heart disease and increases the risk of stroke. This is more likely to develop in people who have high blood pressure readings, are of African descent, or are overweight.  Have your blood pressure checked: ? Every 3-5 years if you are 33-17 years of age. ? Every year if you are 53 years old or older. Diabetes Have regular diabetes screenings. This checks your fasting blood sugar level. Have the screening done:  Once every three years after age 80 if you are at a normal weight and have a low risk for diabetes.  More often and at a younger age if you are overweight or have a high risk for diabetes. What should I know about preventing infection? Hepatitis B If you have a higher risk for hepatitis B, you should be screened for this virus. Talk with your health care provider to find out if you are at risk for hepatitis B infection. Hepatitis C Testing is recommended for:  Everyone born from [birth year] through 1967.  Anyone with known risk factors for hepatitis C. Sexually transmitted infections (STIs)  Get screened for STIs,  including gonorrhea and chlamydia, if: ? You are sexually active and are younger than 76 years of age. ? You are older than 76 years of age and your health care provider tells you that you are at risk for this type of infection. ? Your sexual activity has changed since you were last screened, and you are at increased risk for chlamydia or gonorrhea. Ask your health care provider if you are at risk.  Ask your health care provider about whether you are at high risk for HIV. Your health care provider may  recommend a prescription medicine to help prevent HIV infection. If you choose to take medicine to prevent HIV, you should first get tested for HIV. You should then be tested every 3 months for as long as you are taking the medicine. Pregnancy  If you are about to stop having your period (premenopausal) and you may become pregnant, seek counseling before you get pregnant.  Take 400 to 800 micrograms (mcg) of folic acid every day if you become pregnant.  Ask for birth control (contraception) if you want to prevent pregnancy. Osteoporosis and menopause Osteoporosis is a disease in which the bones lose minerals and strength with aging. This can result in bone fractures. If you are 58 years old or older, or if you are at risk for osteoporosis and fractures, ask your health care provider if you should:  Be screened for bone loss.  Take a calcium or vitamin D supplement to lower your risk of fractures.  Be given hormone replacement therapy (HRT) to treat symptoms of menopause. Follow these instructions at home: Lifestyle  Do not use any products that contain nicotine or tobacco, such as cigarettes, e-cigarettes, and chewing tobacco. If you need help quitting, ask your health care provider.  Do not use street drugs.  Do not share needles.  Ask your health care provider for help if you need support or information about quitting drugs. Alcohol use  Do not drink alcohol if: ? Your health care provider tells you not to drink. ? You are pregnant, may be pregnant, or are planning to become pregnant.  If you drink alcohol: ? Limit how much you use to 0-1 drink a day. ? Limit intake if you are breastfeeding.  Be aware of how much alcohol is in your drink. In the [place] one drink equals one 12 oz bottle of beer (355 mL), one 5 oz glass of wine (148 mL), or one 1 oz glass of hard liquor (44 mL). General instructions  Schedule regular health, dental, and eye exams.  Stay current with your  vaccines.  Tell your health care provider if: ? You often feel depressed. ? You have ever been abused or do not feel safe at home. Summary  Adopting a healthy lifestyle and getting preventive care are important in promoting health and wellness.  Follow your health care provider's instructions about healthy diet, exercising, and getting tested or screened for diseases.  Follow your health care provider's instructions on monitoring your cholesterol and blood pressure. This information is not intended to replace advice given to you by your health care provider. Make sure you discuss any questions you have with your health care provider. Document Revised: 08/08/2018 Document Reviewed: 08/08/2018 Elsevier Patient Education  2020 [employer].

## 2020-03-04 ENCOUNTER — Ambulatory Visit (INDEPENDENT_AMBULATORY_CARE_PROVIDER_SITE_OTHER)
Admission: RE | Admit: 2020-03-04 | Discharge: 2020-03-04 | Disposition: A | Discharge status: Home / Self Care | Payer: Medicare Other | Source: Ambulatory Visit | Attending: Internal Medicine | Admitting: Internal Medicine

## 2020-03-04 DIAGNOSIS — M9903 Segmental and somatic dysfunction of lumbar region: Secondary | ICD-10-CM | POA: Diagnosis not present

## 2020-03-04 DIAGNOSIS — R05 Cough: Secondary | ICD-10-CM

## 2020-03-04 DIAGNOSIS — M5136 Other intervertebral disc degeneration, lumbar region: Secondary | ICD-10-CM | POA: Diagnosis not present

## 2020-03-04 DIAGNOSIS — M9904 Segmental and somatic dysfunction of sacral region: Secondary | ICD-10-CM | POA: Diagnosis not present

## 2020-03-04 DIAGNOSIS — R053 Chronic cough: Secondary | ICD-10-CM

## 2020-03-04 DIAGNOSIS — M9905 Segmental and somatic dysfunction of pelvic region: Secondary | ICD-10-CM | POA: Diagnosis not present

## 2020-03-05 ENCOUNTER — Telehealth: Payer: Self-pay | Admitting: Internal Medicine

## 2020-03-05 NOTE — Telephone Encounter (Signed)
Pt is called the office back to get her lab results

## 2020-03-05 NOTE — Progress Notes (Signed)
Results are stab;le cholesterol is good  , kidney function stable  A1c is the same   good 6.6 . Magnesium is low normal   consider try adding   magnesium supplement otc once a day   and  see if helps  the leg cramp.

## 2020-03-05 NOTE — Progress Notes (Signed)
Nl chest x ray  reassuring( also see lab results)

## 2020-03-06 ENCOUNTER — Telehealth: Payer: Self-pay | Admitting: Internal Medicine

## 2020-03-06 NOTE — Telephone Encounter (Signed)
Called patient and gave her lab results and xray results. Patient verbalized that she would like for [place] to leave a voice message if we are not able to reach her. Patient verbalized an understanding.

## 2020-03-06 NOTE — Telephone Encounter (Signed)
Called patient and spoke to her husband and she had just stepped out and will be back soon and will call back for results.

## 2020-03-06 NOTE — Telephone Encounter (Signed)
Pt returning call  And want a call back.

## 2020-03-10 NOTE — Telephone Encounter (Signed)
Patient called back and I gave her the lab results. Patient verbalized an understanding. ?

## 2020-03-12 DIAGNOSIS — M9904 Segmental and somatic dysfunction of sacral region: Secondary | ICD-10-CM | POA: Diagnosis not present

## 2020-03-12 DIAGNOSIS — M9903 Segmental and somatic dysfunction of lumbar region: Secondary | ICD-10-CM | POA: Diagnosis not present

## 2020-03-12 DIAGNOSIS — M9905 Segmental and somatic dysfunction of pelvic region: Secondary | ICD-10-CM | POA: Diagnosis not present

## 2020-03-12 DIAGNOSIS — M5136 Other intervertebral disc degeneration, lumbar region: Secondary | ICD-10-CM | POA: Diagnosis not present

## 2020-03-18 ENCOUNTER — Other Ambulatory Visit: Payer: Self-pay | Admitting: *Deleted

## 2020-03-18 ENCOUNTER — Other Ambulatory Visit: Payer: Self-pay

## 2020-03-18 ENCOUNTER — Ambulatory Visit: Payer: Medicare Other

## 2020-03-18 DIAGNOSIS — E782 Mixed hyperlipidemia: Secondary | ICD-10-CM

## 2020-03-18 DIAGNOSIS — E039 Hypothyroidism, unspecified: Secondary | ICD-10-CM

## 2020-03-18 DIAGNOSIS — E1165 Type 2 diabetes mellitus with hyperglycemia: Secondary | ICD-10-CM

## 2020-03-18 DIAGNOSIS — K219 Gastro-esophageal reflux disease without esophagitis: Secondary | ICD-10-CM

## 2020-03-18 DIAGNOSIS — E785 Hyperlipidemia, unspecified: Secondary | ICD-10-CM

## 2020-03-18 DIAGNOSIS — I10 Essential (primary) hypertension: Secondary | ICD-10-CM

## 2020-03-18 NOTE — Chronic Care Management (AMB) (Signed)
Chronic Care Management Pharmacy  Name: Kerry Walters  MRN: [id number] DOB: 22-Apr-1946  Initial Questions: 1. Have you seen any other providers since your last visit? N/A 2. Any changes in your medicines or health? No   Chief Complaint/ HPI Kerry Walters,  74 y.o. , female presents for their Initial CCM visit with the clinical pharmacist via telephone due to COVID-19 Pandemic.  Patient reports being busy most days that she has not had a chance to check blood pressure. She plans to start checking blood pressure on days she is not as busy.   Patient reports being prediabetic for many years. She reports watching her sugar intake and reports she eats better now than when she was younger.  She reports current dose of pantoprazole is helping control her acid reflux pretty well with occasional flare. She reports taking for a year and a half.  PCP : Burnis Medin, MD  Their chronic conditions include: HTN, DM, HLD, Hypothyroidism, GERD, noctural leg cramps, cystitis preventio  Office Visits: 03/03/2020- Shanon Ace, MD- Patient presented for office visit for annual exam. Patient to obtain routine blood work. Patient instructed to check BP readings BID for 3 to 5 days. Patient to obtain chest xray.   09/04/2019- Shanon Ace, MD- Patient presented for office visit for 6 month follow up. Patient to obtain routine blood work. BMET, CBC, A1c, hepatic panel, lipid panel, TSH, microalbumin. Patient to follow up in 6 months.   Consult Visit: 10/02/19- 11/19/2019- Chiropractic medicine- Marrion Coy- Unable to access notes.   10/24/2019- Ophthalmology- Clent Jacks- Unable to access notes.   Medications: Outpatient Encounter Medications as of 03/18/2020  Medication Sig Note  . atorvastatin (LIPITOR) 20 MG tablet TAKE 1 TABLET BY MOUTH IN  THE EVENING   . Coenzyme Q10 (COQ-10) 400 MG CAPS Take 1 capsule by mouth daily.   [name] conjugated estrogens (PREMARIN) vaginal cream Place 1 Applicatorful  vaginally as directed. Use 2-3 times weekly  prn   . gabapentin (NEURONTIN) 100 MG capsule TAKE 1 TO 3 CAPSULES BY  MOUTH AT BEDTIME AS  DIRECTED   . levothyroxine (SYNTHROID) 75 MCG tablet TAKE 1 TABLET BY MOUTH  DAILY   . Magnesium 500 MG TABS Take 3 tablets by mouth daily.   . metFORMIN (GLUCOPHAGE-XR) 500 MG 24 hr tablet TAKE 3 TABLETS BY MOUTH  DAILY   . pantoprazole (PROTONIX) 40 MG tablet TAKE 1 TABLET BY MOUTH TWO  TIMES DAILY   . pyridOXINE (VITAMIN B-6) 100 MG tablet Take 100 mg by mouth daily.   . TURMERIC CURCUMIN PO Take 1 tablet by mouth daily. 03/24/2020: 450/50 mg  . valsartan-hydrochlorothiazide (DIOVAN-HCT) 160-25 MG tablet TAKE 1 TABLET BY MOUTH  DAILY   . fluocinonide-emollient (LIDEX-E) 0.05 % cream Apply 1 application topically 2 (two) times daily. Not on face as directed (Patient not taking: Reported on 03/18/2020)    Facility-Administered Encounter Medications as of 03/18/2020  Medication  . betamethasone acetate-betamethasone sodium phosphate (CELESTONE) injection 3 mg  . betamethasone acetate-betamethasone sodium phosphate (CELESTONE) injection 3 mg     Current Diagnosis/Assessment:  Goals Addressed            This Visit's Progress   . Pharmacy Care Plan       CARE PLAN ENTRY (see longitudinal plan of care for additional care plan information)  Current Barriers:  . Chronic Disease Management support, education, and care coordination needs related to Hypertension, Hyperlipidemia, Diabetes, GERD, and Depression   Hypertension BP Readings from  Last 3 Encounters:  03/03/20 (!) 142/86  09/04/19 124/62  03/04/19 134/66   . Pharmacist Clinical Goal(s): o Over the next 30 days, patient will work with PharmD and providers to achieve BP goal <130/80 . Current regimen:  o Valsartan/ hydrochlorothiazide 160/'25mg'$ , 1 tablet once daily . Interventions: o We discussed importance of home blood pressure monitoring and recommendation from Dr. Regis Bill at last visit   . Patient self care activities - Over the next 30 days, patient will: o Check BP as directed, document, and provide at future appointments o Ensure daily salt intake < 2300 mg/day  Hyperlipidemia Lab Results  Component Value Date/Time   LDLCALC 76 03/03/2020 09:26 AM   LDLDIRECT 101.0 03/04/2019 10:45 AM   . Pharmacist Clinical Goal(s): o Over the next 180 days, patient will work with PharmD and providers to maintain LDL goal < 100 . Current regimen:  o Atorvastatin '20mg'$ , 1 tablet in the evening . Interventions: o We discussed how a diet high in plant sterols (fruits/vegetables/nuts/whole grains/legumes) may reduce your cholesterol.  Encouraged increasing fiber to a daily intake of 10-25g/day  . Patient self care activities - Over the next 180 days, patient will: o Continue current medications as directed by provider.   Diabetes Lab Results  Component Value Date/Time   HGBA1C 6.6 (H) 03/03/2020 09:26 AM   HGBA1C 6.6 (A) 09/04/2019 10:17 AM   HGBA1C 6.7 (H) 03/04/2019 10:45 AM   . Pharmacist Clinical Goal(s): o Over the next 180 days, patient will work with PharmD and providers to maintain A1c goal <7% . Current regimen:  o Metformin ER '500mg'$ , 3 tablets once daily (with breakfast)  . Patient self care activities - Over the next 180 days, patient will: o Continue current medications as directed by providers.   Hypothyroidism TSH  Date Value Ref Range Status  03/03/2020 4.49 0.35 - 4.50 uIU/mL Final .  Pharmacist Clinical Goal(s) o Over the next 180 days, patient will work with PharmD and providers to maintain: 0.35 to 4.50 uIU/mL  . Current regimen:  o Levothyroxine 43mg, 1 tablet once daily  . Interventions: o We discussed:  administration of levothyroxine in regards to food and other medications (1 hour at least 30 minutes before first meal)  . Patient self care activities - Over the next 180 days, patient will: o Continue current medications as instructed by provider.    GERD . Pharmacist Clinical Goal(s) o Over the next 180 days, patient will work with PharmD and providers to minimize reflux symptoms.  . Current regimen:  o Pantoprazole '40mg'$ , 1 tablet twice daily  . Interventions: o We discussed: non-pharmacological interventions for acid reflux. Take measures to prevent acid reflux, such as avoiding spicy foods, avoiding caffeine, avoid laying down a few hours after eating, and raising the head of the bed . Patient self care activities o Patient will continue medications as directed by provider.   Medication management . Pharmacist Clinical Goal(s): o Over the next 180 days, patient will work with PharmD and providers to achieve optimal medication adherence . Current pharmacy: [facility] . Interventions o Comprehensive medication review performed. o Continue current medication management strategy . Patient self care activities - Over the next 180 days, patient will: o Take medications as prescribed o Report any questions or concerns to PharmD and/or provider(s)  Initial goal documentation       SDOH Interventions     Most Recent Value  SDOH Interventions  Financial Strain Interventions Intervention Not Indicated  Transportation Interventions Intervention  Not Indicated  [Reports still driving.]        Hypertension    Office blood pressures are  BP Readings from Last 3 Encounters:  03/03/20 (!) 142/86  09/04/19 124/62  03/04/19 134/66   Kidney Function Lab Results  Component Value Date/Time   CREATININE 1.02 03/03/2020 09:26 AM   CREATININE 1.02 03/04/2019 10:45 AM   GFR 52.68 (L) 03/03/2020 09:26 AM   GFRNONAA 71.02 08/03/2010 08:34 AM   GFRAA  11/19/2009 06:00 AM    >60        The eGFR has been calculated using the MDRD equation. This calculation has not been validated in all clinical situations. eGFR's persistently <60 mL/min signify possible Chronic Kidney Disease.   K 3.8 03/03/2020 09:26 AM   K 4.2 03/04/2019 10:45  AM    Patient has failed these meds in the past: triamterene, losartan  Patient checks BP at home reports has not had time to check   Patient home BP readings are ranging: N/A  Patient is on: (unable to assess)  Valsartan/ hydrochlorothiazide 160/71m, 1 tablet once daily   We discussed importance of home blood pressure monitoring and recommendation from Dr. PRegis Billat last visit   Plan Continue current medications  Follow up in 1 month for BP reassessment.     Diabetes   Recent Relevant Labs: Lab Results  Component Value Date/Time   HGBA1C 6.6 (H) 03/03/2020 09:26 AM   HGBA1C 6.6 (A) 09/04/2019 10:17 AM   HGBA1C 6.7 (H) 03/04/2019 10:45 AM   MICROALBUR 21.7 (H) 03/03/2020 09:26 AM   MICROALBUR 7.9 (H) 03/04/2019 10:45 AM    Patient has failed these meds in past: none  Patient is currently controlled on the following medications:   Metformin ER 5044m 3 tablets once daily (with breakfast)   Last diabetic Eye exam:  Lab Results  Component Value Date/Time   HMDIABEYEEXA No Retinopathy 10/24/2019 12:00 AM    Last diabetic Foot exam: No results found for: HMDIABFOOTEX   We discussed: diet and exercise extensively and discussed diagnosis of diabetes in relation to A1c   Plan Continue current medications and control with diet and exercise   Hyperlipidemia   LDL goal < 100   Lipid Panel     Component Value Date/Time   CHOL 142 03/03/2020 0926   TRIG 100.0 03/03/2020 0926   HDL 46.00 03/03/2020 0926   LDLCALC 76 03/03/2020 0926   LDLDIRECT 101.0 03/04/2019 1045    Hepatic Function Latest Ref Rng & Units 03/03/2020 03/04/2019 03/19/2018  Total Protein 6.0 - 8.3 g/dL 6.7 6.9 6.7  Albumin 3.5 - 5.2 g/dL 4.2 4.3 4.0  AST 0 - 37 U/L _0 ALT 0 - 35 U/L _1 Alk Phosphatase 39 - 117 U/L 77 83 76  Total Bilirubin 0.2 - 1.2 mg/dL 0.5 0.3 0.3  Bilirubin, Direct 0.0 - 0.3 mg/dL 0.1 0.1 0.1     The 10-year ASCVD risk score (GMikey BussingC Jr., et al., 2013) is:  45.9%   Values used to calculate the score:     Age: [age]     Sex: Female     Is Non-Hispanic African American: No     Diabetic: Yes     Tobacco smoker: No     Systolic Blood Pressure: 14031mHg     Is BP treated: Yes     HDL Cholesterol: 46 mg/dL     Total Cholesterol: 142 mg/dL   Patient has failed these  meds in past: none  Patient is currently controlled on the following medications:   Atorvastatin 8m, 1 tablet in the evening  We discussed:  diet and exercise extensively . We discussed how a diet high in plant sterols (fruits/vegetables/nuts/whole grains/legumes) may reduce your cholesterol.  Encouraged increasing fiber to a daily intake of 10-25g/day  Plan Continue current medications  Hypothyroidism   Lab Results  Component Value Date/Time   TSH 4.49 03/03/2020 09:26 AM   TSH 4.22 03/04/2019 10:45 AM    Patient is currently controlled on the following medications:  . Levothyroxine 779m, 1 tablet once daily (1 hour before breakfast)   We discussed:  administration of levothyroxine in regards to food and other medications (1 hour at least 30 minutes before first meal)   Plan Continue current medications  GERD   Patient has failed these meds in past: omeprazole   Patient is currently controlled on the following medications:  . Pantoprazole 4040m1 tablet twice daily   We discussed:   -non-pharmacological interventions for acid reflux. Take measures to prevent acid reflux, such as avoiding spicy foods, avoiding caffeine, avoid laying down a few hours after eating, and raising the head of the bed. - discussed risks versus benefits of long term PPIs    Plan Continue current medications Reassess at follow up.   Nocturnal leg cramps    Reports helping with "crawling feeling in legs"   Patient is currently controlled on the following medications:  . Gabapentin 100m57m capsule at bedtime   Plan Continue current medications  Cystitis prevention     Notes getting cystitis frequently in the past and was recommended by urologist.   Patient is currently controlled on the following medications:  . Conjugated estrogens (Premarin) vaginal cream, apply 2 to 3 times weekly as needed   Plan Managed by urologist.  Continue current medications   OTC/ supplements    Patient is currently on the following medications:   Vitamin B6 100mg32mtablet once daily  Magnesium oxide 500mg,1mreased to 3 tablets/ day  turmeric cucurmin 450mg/563m 1 tablet once daily  CoQ-10 400mg, 163mlet once daily   Plan Continue current medications    Vaccines   Reviewed and discussed patient's vaccination history.    Immunization History  Administered Date(s) Administered  . Influenza Split 07/10/2013  . Influenza Whole 06/29/2007, 06/16/2008, 07/08/2009, 08/03/2010  . Influenza, High Dose Seasonal PF 07/17/2014, 05/18/2015, 08/23/2016, 06/30/2017, 07/02/2018, 06/17/2019  . Moderna SARS-COVID-2 Vaccination 10/23/2019, 11/20/2019  . Pneumococcal Conjugate-13 11/05/2013  . Pneumococcal Polysaccharide-23 08/11/2015  . Td 08/30/2003, 11/24/2014  . Zoster 04/27/2012  . Zoster Recombinat (Shingrix) 06/17/2019    Plan Patient to verify 2nd dose of shingles vaccine    Medication Management  Patient organizes medications: patient reports having own strategy for managing medications. Takes BP med after breakfast and will put them up after taking them. Reports not using pill box.  Primary pharmacy: [facility] Adherence:  - valsartan/ HCTZ 160/25mg, re55m fills: 10/24/19; 02/14/20 - metformin ER 500mg rece83mills: 10/24/19, 02/04/20     Follow up Follow up visit with PharmD in 6 months.      DeAnson Croftslinical Pharmacist [facility] [place]-[phone]

## 2020-03-18 NOTE — Telephone Encounter (Signed)
Referral placed as requested.

## 2020-03-24 NOTE — Patient Instructions (Addendum)
Visit Information  Goals Addressed            This Visit's Progress   . Pharmacy Care Plan       CARE PLAN ENTRY (see longitudinal plan of care for additional care plan information)  Current Barriers:  . Chronic Disease Management support, education, and care coordination needs related to Hypertension, Hyperlipidemia, Diabetes, GERD, and Depression   Hypertension BP Readings from Last 3 Encounters:  03/03/20 (!) 142/86  09/04/19 124/62  03/04/19 134/66   . Pharmacist Clinical Goal(s): o Over the next 30 days, patient will work with PharmD and providers to achieve BP goal <130/80 . Current regimen:  o Valsartan/ hydrochlorothiazide 160/25mg , 1 tablet once daily . Interventions: o We discussed importance of home blood pressure monitoring and recommendation from Dr. Fabian Sharp at last visit  . Patient self care activities - Over the next 30 days, patient will: o Check BP as directed, document, and provide at future appointments o Ensure daily salt intake < 2300 mg/day  Hyperlipidemia Lab Results  Component Value Date/Time   LDLCALC 76 03/03/2020 09:26 AM   LDLDIRECT 101.0 03/04/2019 10:45 AM   . Pharmacist Clinical Goal(s): o Over the next 180 days, patient will work with PharmD and providers to maintain LDL goal < 100 . Current regimen:  o Atorvastatin 20mg , 1 tablet in the evening . Interventions: o We discussed how a diet high in plant sterols (fruits/vegetables/nuts/whole grains/legumes) may reduce your cholesterol.  Encouraged increasing fiber to a daily intake of 10-25g/day  . Patient self care activities - Over the next 180 days, patient will: o Continue current medications as directed by provider.   Diabetes Lab Results  Component Value Date/Time   HGBA1C 6.6 (H) 03/03/2020 09:26 AM   HGBA1C 6.6 (A) 09/04/2019 10:17 AM   HGBA1C 6.7 (H) 03/04/2019 10:45 AM   . Pharmacist Clinical Goal(s): o Over the next 180 days, patient will work with PharmD and providers to  maintain A1c goal <7% . Current regimen:  o Metformin ER 500mg , 3 tablets once daily (with breakfast)  . Patient self care activities - Over the next 180 days, patient will: o Continue current medications as directed by providers.   Hypothyroidism TSH  Date Value Ref Range Status  03/03/2020 4.49 0.35 - 4.50 uIU/mL Final .  Pharmacist Clinical Goal(s) o Over the next 180 days, patient will work with PharmD and providers to maintain: 0.35 to 4.50 uIU/mL  . Current regimen:  o Levothyroxine , 1 tablet once daily  . Interventions: o We discussed:  administration of levothyroxine in regards to food and other medications (1 hour at least 30 minutes before first meal)  . Patient self care activities - Over the next 180 days, patient will: o Continue current medications as instructed by provider.   GERD . Pharmacist Clinical Goal(s) o Over the next 180 days, patient will work with PharmD and providers to minimize reflux symptoms.  . Current regimen:  o Pantoprazole 40mg , 1 tablet twice daily  . Interventions: o We discussed: non-pharmacological interventions for acid reflux. Take measures to prevent acid reflux, such as avoiding spicy foods, avoiding caffeine, avoid laying down a few hours after eating, and raising the head of the bed . Patient self care activities o Patient will continue medications as directed by provider.   Medication management . Pharmacist Clinical Goal(s): o Over the next 180 days, patient will work with PharmD and providers to achieve optimal medication adherence . Current pharmacy: [facility] . Interventions o  Comprehensive medication review performed. o Continue current medication management strategy . Patient self care activities - Over the next 180 days, patient will: o Take medications as prescribed o Report any questions or concerns to PharmD and/or provider(s)  Initial goal documentation        Ms. Leone BrandWaite was given information about Chronic Care  Management services today including:  1. CCM service includes personalized support from designated clinical staff supervised by her physician, including individualized plan of care and coordination with other care providers 2. 24/7 contact phone numbers for assistance for urgent and routine care needs. 3. Standard insurance, coinsurance, copays and deductibles apply for chronic care management only during months in which we provide at least 20 minutes of these services. Most insurances cover these services at 100%, however patients may be responsible for any copay, coinsurance and/or deductible if applicable. This service may help you avoid the need for more expensive face-to-face services. 4. Only one practitioner may furnish and bill the service in a calendar month. 5. The patient may stop CCM services at any time (effective at the end of the month) by phone call to the office staff.  Patient agreed to services and verbal consent obtained.   The patient verbalized understanding of instructions provided today and agreed to receive a mailed copy of patient instruction and/or educational materials. Telephone follow up appointment with pharmacy team member scheduled for: 09/19/2019   Laural BenesAnnette De Santiago, PharmD Clinical Pharmacist [facility] [phone]    DASH Eating Plan DASH stands for "Dietary Approaches to Stop Hypertension." The DASH eating plan is a healthy eating plan that has been shown to reduce high blood pressure (hypertension). It may also reduce your risk for type 2 diabetes, heart disease, and stroke. The DASH eating plan may also help with weight loss. What are tips for following this plan?  General guidelines  Avoid eating more than 2,300 mg (milligrams) of salt (sodium) a day. If you have hypertension, you may need to reduce your sodium intake to 1,500 mg a day.  Limit alcohol intake to no more than 1 drink a day for nonpregnant women and 2 drinks  a day for men. One drink equals 12 oz of beer, 5 oz of wine, or 1 oz of hard liquor.  Work with your health care provider to maintain a healthy body weight or to lose weight. Ask what an ideal weight is for you.  Get at least 30 minutes of exercise that causes your heart to beat faster (aerobic exercise) most days of the week. Activities may include walking, swimming, or biking.  Work with your health care provider or diet and nutrition specialist (dietitian) to adjust your eating plan to your individual calorie needs. Reading food labels   Check food labels for the amount of sodium per serving. Choose foods with less than 5 percent of the Daily Value of sodium. Generally, foods with less than 300 mg of sodium per serving fit into this eating plan.  To find whole grains, look for the word "whole" as the first word in the ingredient list. Shopping  Buy products labeled as "low-sodium" or "no salt added."  Buy fresh foods. Avoid canned foods and premade or frozen meals. Cooking  Avoid adding salt when cooking. Use salt-free seasonings or herbs instead of table salt or sea salt. Check with your health care provider or pharmacist before using salt substitutes.  Do not fry foods. Cook foods using healthy methods such as baking, boiling,  grilling, and broiling instead.  Cook with heart-healthy oils, such as olive, canola, soybean, or sunflower oil. Meal planning  Eat a balanced diet that includes: ? 5 or more servings of fruits and vegetables each day. At each meal, try to fill half of your plate with fruits and vegetables. ? Up to 6-8 servings of whole grains each day. ? Less than 6 oz of lean meat, poultry, or fish each day. A 3-oz serving of meat is about the same size as a deck of cards. One egg equals 1 oz. ? 2 servings of low-fat dairy each day. ? A serving of nuts, seeds, or beans 5 times each week. ? Heart-healthy fats. Healthy fats called Omega-3 fatty acids are found in foods  such as flaxseeds and coldwater fish, like sardines, salmon, and mackerel.  Limit how much you eat of the following: ? Canned or prepackaged foods. ? Food that is high in trans fat, such as fried foods. ? Food that is high in saturated fat, such as fatty meat. ? Sweets, desserts, sugary drinks, and other foods with added sugar. ? Full-fat dairy products.  Do not salt foods before eating.  Try to eat at least 2 vegetarian meals each week.  Eat more home-cooked food and less restaurant, buffet, and fast food.  When eating at a restaurant, ask that your food be prepared with less salt or no salt, if possible. What foods are recommended? The items listed may not be a complete list. Talk with your dietitian about what dietary choices are best for you. Grains Whole-grain or whole-wheat bread. Whole-grain or whole-wheat pasta. Brown rice. Orpah Cobb. Bulgur. Whole-grain and low-sodium cereals. Pita bread. Low-fat, low-sodium crackers. Whole-wheat flour tortillas. Vegetables Fresh or frozen vegetables (raw, steamed, roasted, or grilled). Low-sodium or reduced-sodium tomato and vegetable juice. Low-sodium or reduced-sodium tomato sauce and tomato paste. Low-sodium or reduced-sodium canned vegetables. Fruits All fresh, dried, or frozen fruit. Canned fruit in natural juice (without added sugar). Meat and other protein foods Skinless chicken or [place]. Ground chicken or [place]. Pork with fat trimmed off. Fish and seafood. Egg whites. Dried beans, peas, or lentils. Unsalted nuts, nut butters, and seeds. Unsalted canned beans. Lean cuts of beef with fat trimmed off. Low-sodium, lean deli meat. Dairy Low-fat (1%) or fat-free (skim) milk. Fat-free, low-fat, or reduced-fat cheeses. Nonfat, low-sodium ricotta or cottage cheese. Low-fat or nonfat yogurt. Low-fat, low-sodium cheese. Fats and oils Soft margarine without trans fats. Vegetable oil. Low-fat, reduced-fat, or light mayonnaise and salad  dressings (reduced-sodium). Canola, safflower, olive, soybean, and sunflower oils. Avocado. Seasoning and other foods Herbs. Spices. Seasoning mixes without salt. Unsalted popcorn and pretzels. Fat-free sweets. What foods are not recommended? The items listed may not be a complete list. Talk with your dietitian about what dietary choices are best for you. Grains Baked goods made with fat, such as croissants, muffins, or some breads. Dry pasta or rice meal packs. Vegetables Creamed or fried vegetables. Vegetables in a cheese sauce. Regular canned vegetables (not low-sodium or reduced-sodium). Regular canned tomato sauce and paste (not low-sodium or reduced-sodium). Regular tomato and vegetable juice (not low-sodium or reduced-sodium). Rosita Fire. Olives. Fruits Canned fruit in a light or heavy syrup. Fried fruit. Fruit in cream or butter sauce. Meat and other protein foods Fatty cuts of meat. Ribs. Fried meat. Tomasa Blase. Sausage. Bologna and other processed lunch meats. Salami. Fatback. Hotdogs. Bratwurst. Salted nuts and seeds. Canned beans with added salt. Canned or smoked fish. Whole eggs or egg yolks. Chicken or  [place] with skin. Dairy Whole or 2% milk, cream, and half-and-half. Whole or full-fat cream cheese. Whole-fat or sweetened yogurt. Full-fat cheese. Nondairy creamers. Whipped toppings. Processed cheese and cheese spreads. Fats and oils Butter. Stick margarine. Lard. Shortening. Ghee. Bacon fat. Tropical oils, such as coconut, palm kernel, or palm oil. Seasoning and other foods Salted popcorn and pretzels. Onion salt, garlic salt, seasoned salt, table salt, and sea salt. Worcestershire sauce. Tartar sauce. Barbecue sauce. Teriyaki sauce. Soy sauce, including reduced-sodium. Steak sauce. Canned and packaged gravies. Fish sauce. Oyster sauce. Cocktail sauce. Horseradish that you find on the shelf. Ketchup. Mustard. Meat flavorings and tenderizers. Bouillon cubes. Hot sauce and Tabasco sauce.  Premade or packaged marinades. Premade or packaged taco seasonings. Relishes. Regular salad dressings. Where to find more information:  National Heart, Lung, and Blood Institute: PopSteam.is  American Heart Association: www.heart.org Summary  The DASH eating plan is a healthy eating plan that has been shown to reduce high blood pressure (hypertension). It may also reduce your risk for type 2 diabetes, heart disease, and stroke.  With the DASH eating plan, you should limit salt (sodium) intake to 2,300 mg a day. If you have hypertension, you may need to reduce your sodium intake to 1,500 mg a day.  When on the DASH eating plan, aim to eat more fresh fruits and vegetables, whole grains, lean proteins, low-fat dairy, and heart-healthy fats.  Work with your health care provider or diet and nutrition specialist (dietitian) to adjust your eating plan to your individual calorie needs. This information is not intended to replace advice given to you by your health care provider. Make sure you discuss any questions you have with your health care provider. Document Revised: 07/28/2017 Document Reviewed: 08/08/2016 Elsevier Patient Education  2020 [employer].

## 2020-04-01 DIAGNOSIS — M5136 Other intervertebral disc degeneration, lumbar region: Secondary | ICD-10-CM | POA: Diagnosis not present

## 2020-04-01 DIAGNOSIS — M9904 Segmental and somatic dysfunction of sacral region: Secondary | ICD-10-CM | POA: Diagnosis not present

## 2020-04-01 DIAGNOSIS — M9905 Segmental and somatic dysfunction of pelvic region: Secondary | ICD-10-CM | POA: Diagnosis not present

## 2020-04-01 DIAGNOSIS — M9903 Segmental and somatic dysfunction of lumbar region: Secondary | ICD-10-CM | POA: Diagnosis not present

## 2020-04-06 ENCOUNTER — Other Ambulatory Visit: Payer: Self-pay | Admitting: Internal Medicine

## 2020-04-16 ENCOUNTER — Other Ambulatory Visit: Payer: Self-pay | Admitting: Internal Medicine

## 2020-04-20 DIAGNOSIS — M5136 Other intervertebral disc degeneration, lumbar region: Secondary | ICD-10-CM | POA: Diagnosis not present

## 2020-04-20 DIAGNOSIS — M9905 Segmental and somatic dysfunction of pelvic region: Secondary | ICD-10-CM | POA: Diagnosis not present

## 2020-04-20 DIAGNOSIS — M9904 Segmental and somatic dysfunction of sacral region: Secondary | ICD-10-CM | POA: Diagnosis not present

## 2020-04-20 DIAGNOSIS — M9903 Segmental and somatic dysfunction of lumbar region: Secondary | ICD-10-CM | POA: Diagnosis not present

## 2020-04-23 ENCOUNTER — Telehealth: Payer: Self-pay | Admitting: Internal Medicine

## 2020-04-28 NOTE — Telephone Encounter (Signed)
Pt stated [facility] informed her to contact us about these refills:   Medication: Levothyroxine Valsartan   Pharmacy:   [facility] [place], [place] - [address] [place], [address] Phone:  [phone]  Fax:  [phone]

## 2020-04-29 NOTE — Telephone Encounter (Signed)
These medications have already been approved and I have not seen any paperwork in regards to these medications from [facility].

## 2020-05-18 DIAGNOSIS — M5136 Other intervertebral disc degeneration, lumbar region: Secondary | ICD-10-CM | POA: Diagnosis not present

## 2020-05-18 DIAGNOSIS — M9904 Segmental and somatic dysfunction of sacral region: Secondary | ICD-10-CM | POA: Diagnosis not present

## 2020-05-18 DIAGNOSIS — M9903 Segmental and somatic dysfunction of lumbar region: Secondary | ICD-10-CM | POA: Diagnosis not present

## 2020-05-18 DIAGNOSIS — M9905 Segmental and somatic dysfunction of pelvic region: Secondary | ICD-10-CM | POA: Diagnosis not present

## 2020-05-25 DIAGNOSIS — M9903 Segmental and somatic dysfunction of lumbar region: Secondary | ICD-10-CM | POA: Diagnosis not present

## 2020-05-25 DIAGNOSIS — M9905 Segmental and somatic dysfunction of pelvic region: Secondary | ICD-10-CM | POA: Diagnosis not present

## 2020-05-25 DIAGNOSIS — M5136 Other intervertebral disc degeneration, lumbar region: Secondary | ICD-10-CM | POA: Diagnosis not present

## 2020-05-25 DIAGNOSIS — M9904 Segmental and somatic dysfunction of sacral region: Secondary | ICD-10-CM | POA: Diagnosis not present

## 2020-06-03 DIAGNOSIS — M9905 Segmental and somatic dysfunction of pelvic region: Secondary | ICD-10-CM | POA: Diagnosis not present

## 2020-06-03 DIAGNOSIS — M9904 Segmental and somatic dysfunction of sacral region: Secondary | ICD-10-CM | POA: Diagnosis not present

## 2020-06-03 DIAGNOSIS — M5136 Other intervertebral disc degeneration, lumbar region: Secondary | ICD-10-CM | POA: Diagnosis not present

## 2020-06-03 DIAGNOSIS — M9903 Segmental and somatic dysfunction of lumbar region: Secondary | ICD-10-CM | POA: Diagnosis not present

## 2020-06-16 DIAGNOSIS — M9904 Segmental and somatic dysfunction of sacral region: Secondary | ICD-10-CM | POA: Diagnosis not present

## 2020-06-16 DIAGNOSIS — M9905 Segmental and somatic dysfunction of pelvic region: Secondary | ICD-10-CM | POA: Diagnosis not present

## 2020-06-16 DIAGNOSIS — M9903 Segmental and somatic dysfunction of lumbar region: Secondary | ICD-10-CM | POA: Diagnosis not present

## 2020-06-16 DIAGNOSIS — M5136 Other intervertebral disc degeneration, lumbar region: Secondary | ICD-10-CM | POA: Diagnosis not present

## 2020-06-23 ENCOUNTER — Telehealth: Payer: Self-pay | Admitting: Internal Medicine

## 2020-06-23 DIAGNOSIS — M9905 Segmental and somatic dysfunction of pelvic region: Secondary | ICD-10-CM | POA: Diagnosis not present

## 2020-06-23 DIAGNOSIS — M9903 Segmental and somatic dysfunction of lumbar region: Secondary | ICD-10-CM | POA: Diagnosis not present

## 2020-06-23 DIAGNOSIS — M5136 Other intervertebral disc degeneration, lumbar region: Secondary | ICD-10-CM | POA: Diagnosis not present

## 2020-06-23 DIAGNOSIS — M9904 Segmental and somatic dysfunction of sacral region: Secondary | ICD-10-CM | POA: Diagnosis not present

## 2020-06-23 NOTE — Telephone Encounter (Signed)
Left message for patient to call back and schedule Medicare Annual Wellness Visit (AWV) either virtually or in office.  Last AWV 08/23/16; please schedule at anytime with [facility] Nurse Health Advisor 2.  This should be a 45 minute visit.

## 2020-06-30 DIAGNOSIS — M9904 Segmental and somatic dysfunction of sacral region: Secondary | ICD-10-CM | POA: Diagnosis not present

## 2020-06-30 DIAGNOSIS — M5136 Other intervertebral disc degeneration, lumbar region: Secondary | ICD-10-CM | POA: Diagnosis not present

## 2020-06-30 DIAGNOSIS — M9903 Segmental and somatic dysfunction of lumbar region: Secondary | ICD-10-CM | POA: Diagnosis not present

## 2020-06-30 DIAGNOSIS — M9905 Segmental and somatic dysfunction of pelvic region: Secondary | ICD-10-CM | POA: Diagnosis not present

## 2020-07-07 DIAGNOSIS — M5136 Other intervertebral disc degeneration, lumbar region: Secondary | ICD-10-CM | POA: Diagnosis not present

## 2020-07-07 DIAGNOSIS — M9904 Segmental and somatic dysfunction of sacral region: Secondary | ICD-10-CM | POA: Diagnosis not present

## 2020-07-07 DIAGNOSIS — M9903 Segmental and somatic dysfunction of lumbar region: Secondary | ICD-10-CM | POA: Diagnosis not present

## 2020-07-07 DIAGNOSIS — M9905 Segmental and somatic dysfunction of pelvic region: Secondary | ICD-10-CM | POA: Diagnosis not present

## 2020-07-08 DIAGNOSIS — Z1231 Encounter for screening mammogram for malignant neoplasm of breast: Secondary | ICD-10-CM | POA: Diagnosis not present

## 2020-07-08 LAB — HM MAMMOGRAPHY

## 2020-07-14 DIAGNOSIS — M9903 Segmental and somatic dysfunction of lumbar region: Secondary | ICD-10-CM | POA: Diagnosis not present

## 2020-07-14 DIAGNOSIS — M9905 Segmental and somatic dysfunction of pelvic region: Secondary | ICD-10-CM | POA: Diagnosis not present

## 2020-07-14 DIAGNOSIS — M9904 Segmental and somatic dysfunction of sacral region: Secondary | ICD-10-CM | POA: Diagnosis not present

## 2020-07-14 DIAGNOSIS — M5136 Other intervertebral disc degeneration, lumbar region: Secondary | ICD-10-CM | POA: Diagnosis not present

## 2020-07-21 DIAGNOSIS — M9904 Segmental and somatic dysfunction of sacral region: Secondary | ICD-10-CM | POA: Diagnosis not present

## 2020-07-21 DIAGNOSIS — M5136 Other intervertebral disc degeneration, lumbar region: Secondary | ICD-10-CM | POA: Diagnosis not present

## 2020-07-21 DIAGNOSIS — M9903 Segmental and somatic dysfunction of lumbar region: Secondary | ICD-10-CM | POA: Diagnosis not present

## 2020-07-21 DIAGNOSIS — M9905 Segmental and somatic dysfunction of pelvic region: Secondary | ICD-10-CM | POA: Diagnosis not present

## 2020-07-27 DIAGNOSIS — M9904 Segmental and somatic dysfunction of sacral region: Secondary | ICD-10-CM | POA: Diagnosis not present

## 2020-07-27 DIAGNOSIS — M9905 Segmental and somatic dysfunction of pelvic region: Secondary | ICD-10-CM | POA: Diagnosis not present

## 2020-07-27 DIAGNOSIS — M5136 Other intervertebral disc degeneration, lumbar region: Secondary | ICD-10-CM | POA: Diagnosis not present

## 2020-07-27 DIAGNOSIS — M9903 Segmental and somatic dysfunction of lumbar region: Secondary | ICD-10-CM | POA: Diagnosis not present

## 2020-08-11 DIAGNOSIS — M9904 Segmental and somatic dysfunction of sacral region: Secondary | ICD-10-CM | POA: Diagnosis not present

## 2020-08-11 DIAGNOSIS — M5136 Other intervertebral disc degeneration, lumbar region: Secondary | ICD-10-CM | POA: Diagnosis not present

## 2020-08-11 DIAGNOSIS — M9905 Segmental and somatic dysfunction of pelvic region: Secondary | ICD-10-CM | POA: Diagnosis not present

## 2020-08-11 DIAGNOSIS — M9903 Segmental and somatic dysfunction of lumbar region: Secondary | ICD-10-CM | POA: Diagnosis not present

## 2020-08-12 ENCOUNTER — Encounter: Payer: Self-pay | Admitting: Internal Medicine

## 2020-08-18 DIAGNOSIS — M9904 Segmental and somatic dysfunction of sacral region: Secondary | ICD-10-CM | POA: Diagnosis not present

## 2020-08-18 DIAGNOSIS — M9905 Segmental and somatic dysfunction of pelvic region: Secondary | ICD-10-CM | POA: Diagnosis not present

## 2020-08-18 DIAGNOSIS — M9903 Segmental and somatic dysfunction of lumbar region: Secondary | ICD-10-CM | POA: Diagnosis not present

## 2020-08-18 DIAGNOSIS — M5136 Other intervertebral disc degeneration, lumbar region: Secondary | ICD-10-CM | POA: Diagnosis not present

## 2020-08-25 DIAGNOSIS — M9903 Segmental and somatic dysfunction of lumbar region: Secondary | ICD-10-CM | POA: Diagnosis not present

## 2020-08-25 DIAGNOSIS — M9905 Segmental and somatic dysfunction of pelvic region: Secondary | ICD-10-CM | POA: Diagnosis not present

## 2020-08-25 DIAGNOSIS — M5136 Other intervertebral disc degeneration, lumbar region: Secondary | ICD-10-CM | POA: Diagnosis not present

## 2020-08-25 DIAGNOSIS — M9904 Segmental and somatic dysfunction of sacral region: Secondary | ICD-10-CM | POA: Diagnosis not present

## 2020-09-04 ENCOUNTER — Ambulatory Visit: Payer: Medicare Other | Admitting: Internal Medicine

## 2020-09-04 ENCOUNTER — Other Ambulatory Visit: Payer: Self-pay

## 2020-09-06 NOTE — Progress Notes (Signed)
Chief Complaint  Patient presents with  . Follow-up    HPI: Kerry Walters 75 y.o. come in for Chronic disease management  And a couple of new concerns to discuss   HLD continued medicine  BG working on taking metformin twice daily  Thyroid taking daily  BP aching medicine does not check it at home because it will go up but seems fine  ? Cough  Still there patan twice daily not helping bid cough just at night when she lays down and then when settles it is better.  Now she has hiccups every time after she eats a meal the last a few minutes.No diarrhea.  Has had a history of a low magnesium uncertain why no diarrhea asked about this.  Of concern to her is that since she had COVID in December 2020 she has had no energy and not back to baseline.  Asked about checking iron levels.  Did get the Moderna vaccine x2 question whether had side effect.  No fevers new joint pains syncope not real shortness of breath just tired as the day goes on.  Needs flu shot today of note she states that helps her allergies.  Takes gabapentin 1 at night for nocturnal leg symptoms occasionally takes 2 has been doing that for a while. ROS: See pertinent positives and negatives per HPI.  Has not seen GI in quite a while.  Past Medical History:  Diagnosis Date  . Asthma due to environmental allergies    no inhaler  . Diverticulosis of colon   . GERD (gastroesophageal reflux disease)   . History of esophageal dilatation    FOR STRICTURE IN 2005  . History of hiatal hernia   . History of hypercalcemia    SECONDARY TO HYPERPARATHYROID  . History of hyperparathyroidism    PRIMARY--  S/P RIGHT PARATHYROIDECTOMY  . History of kidney stones   . History of recurrent UTIs   . Hyperlipidemia   . Hypertension   . Hypothyroidism   . Incomplete right bundle branch block   . Nephrolithiasis    RIGHT   . Nocturia   . PONV (postoperative nausea and vomiting)   . Pre-diabetes   . Sciatica of right  side     Family History  Problem Relation Age of Onset  . Cancer Father        brain  . Cancer Son        liver  . Diabetes Sister   . Schizophrenia Son     Social History   Socioeconomic History  . Marital status: Married    Spouse name: Not on file  . Number of children: Not on file  . Years of education: Not on file  . Highest education level: Not on file  Occupational History  . Not on file  Tobacco Use  . Smoking status: Never Smoker  . Smokeless tobacco: Never Used  Vaping Use  . Vaping Use: Never used  Substance and Sexual Activity  . Alcohol use: No  . Drug use: No  . Sexual activity: Not on file  Other Topics Concern  . Not on file  Social History Narrative   Married   hh of 2   4 dogs   G2 P2   Water exercise.   Retired    [employer]    great grandchild          Social Determinants of Corporate investment banker Strain: Low Risk   . Difficulty of Paying Living  Expenses: Not hard at all  Food Insecurity: Not on file  Transportation Needs: No Transportation Needs  . Lack of Transportation (Medical): No  . Lack of Transportation (Non-Medical): No  Physical Activity: Not on file  Stress: Not on file  Social Connections: Not on file    Outpatient Medications Prior to Visit  Medication Sig Dispense Refill  . atorvastatin (LIPITOR) 20 MG tablet TAKE 1 TABLET BY MOUTH IN  THE EVENING 90 tablet 1  . Coenzyme Q10 (COQ-10) 400 MG CAPS Take 1 capsule by mouth daily.    [name]. conjugated estrogens (PREMARIN) vaginal cream Place 1 Applicatorful vaginally as directed. Use 2-3 times weekly  prn 42.5 g 2  . gabapentin (NEURONTIN) 100 MG capsule TAKE 1 TO 3 CAPSULES BY  MOUTH AT BEDTIME AS  DIRECTED 270 capsule 1  . levothyroxine (SYNTHROID) 75 MCG tablet TAKE 1 TABLET BY MOUTH  DAILY 90 tablet 3  . Magnesium 500 MG TABS Take 3 tablets by mouth daily.    . metFORMIN (GLUCOPHAGE-XR) 500 MG 24 hr tablet TAKE 3 TABLETS BY MOUTH  DAILY 270 tablet 3  . pantoprazole  (PROTONIX) 40 MG tablet TAKE 1 TABLET BY MOUTH TWO  TIMES DAILY 180 tablet 3  . pyridOXINE (VITAMIN B-6) 100 MG tablet Take 100 mg by mouth daily.    . TURMERIC CURCUMIN PO Take 1 tablet by mouth daily.    . valsartan-hydrochlorothiazide (DIOVAN-HCT) 160-25 MG tablet TAKE 1 TABLET BY MOUTH  DAILY 90 tablet 2  . fluocinonide-emollient (LIDEX-E) 0.05 % cream Apply 1 application topically 2 (two) times daily. Not on face as directed (Patient not taking: Reported on 03/18/2020) 60 g 1   Facility-Administered Medications Prior to Visit  Medication Dose Route Frequency Provider Last Rate Last Admin  . betamethasone acetate-betamethasone sodium phosphate (CELESTONE) injection 3 mg  3 mg Intramuscular Once Gala LewandowskyEvans, Brent M, DPM      . betamethasone acetate-betamethasone sodium phosphate (CELESTONE) injection 3 mg  3 mg Intramuscular Once Evans, Brent M, DPM         EXAM:  BP 124/82 (BP Location: Left Arm, Patient Position: Sitting, Cuff Size: Large)   Pulse 72   Temp 97.8 F (36.6 C) (Oral)   Ht 5' 1.75" (1.568 m)   Wt 156 lb 9.6 oz (71 kg)   BMI 28.87 kg/m   Body mass index is 28.87 kg/m.  GENERAL: vitals reviewed and listed above, alert, oriented, appears well hydrated and in no acute distress HEENT: atraumatic, conjunctiva  clear, no obvious abnormalities on inspection of external nose and ears OP : Masked ate  NECK: no obvious masses on inspection palpation  LUNGS: clear to auscultation bilaterally, no wheezes, rales or rhonchi, good air movement CV: HRRR, no clubbing cyanosis or  peripheral edema nl cap refill  MS: moves all extremities without noticeable focal  abnormality PSYCH: pleasant and cooperative, no obvious depression or anxiety Lab Results  Component Value Date   WBC 8.2 03/03/2020   HGB 12.6 03/03/2020   HCT 37.7 03/03/2020   PLT 291.0 03/03/2020   GLUCOSE 127 (H) 03/03/2020   CHOL 142 03/03/2020   TRIG 100.0 03/03/2020   HDL 46.00 03/03/2020   LDLDIRECT 101.0  03/04/2019   LDLCALC 76 03/03/2020   ALT 20 03/03/2020   AST 23 03/03/2020   NA 144 03/03/2020   K 3.8 03/03/2020   CL 102 03/03/2020   CREATININE 1.02 03/03/2020   BUN 23 03/03/2020   CO2 32 03/03/2020   TSH 4.49 03/03/2020  INR 1.12 11/19/2009   HGBA1C 6.4 (A) 09/07/2020   MICROALBUR 21.7 (H) 03/03/2020   BP Readings from Last 3 Encounters:  09/07/20 124/82  03/03/20 (!) 142/86  09/04/19 124/62   Wt Readings from Last 3 Encounters:  09/07/20 156 lb 9.6 oz (71 kg)  03/03/20 152 lb 12.8 oz (69.3 kg)  09/04/19 156 lb 6.4 oz (70.9 kg)  No results found for: VITAMINB12   ASSESSMENT AND PLAN:  Discussed the following assessment and plan:  Type 2 diabetes mellitus with hyperglycemia, without long-term current use of insulin (HCC) - Plan: POC HgB A1c, Comprehensive metabolic panel, C-reactive protein, Iron, TIBC and Ferritin Panel, Magnesium, SARS-CoV-2 Semi-Quantitative Total Antibody, Spike, Sedimentation rate, T4, free, TSH, Vitamin B12, C-reactive protein, Iron, TIBC and Ferritin Panel, Magnesium, SARS-CoV-2 Semi-Quantitative Total Antibody, Spike, Vitamin B12, TSH, T4, free, Sedimentation rate, Comprehensive metabolic panel, CANCELED: TSH, CANCELED: T4, free, CANCELED: Comprehensive metabolic panel, CANCELED: Vitamin B12, CANCELED: Magnesium, CANCELED: Iron, TIBC and Ferritin Panel, CANCELED: C-reactive protein, CANCELED: SARS-CoV-2 Semi-Quantitative Total Antibody, Spike, CANCELED: Sedimentation rate  Essential hypertension - Plan: Comprehensive metabolic panel, C-reactive protein, Iron, TIBC and Ferritin Panel, Magnesium, SARS-CoV-2 Semi-Quantitative Total Antibody, Spike, Sedimentation rate, T4, free, TSH, Vitamin B12, C-reactive protein, Iron, TIBC and Ferritin Panel, Magnesium, SARS-CoV-2 Semi-Quantitative Total Antibody, Spike, Vitamin B12, TSH, T4, free, Sedimentation rate, Comprehensive metabolic panel, CANCELED: TSH, CANCELED: T4, free, CANCELED: Comprehensive metabolic  panel, CANCELED: Vitamin B12, CANCELED: Magnesium, CANCELED: Iron, TIBC and Ferritin Panel, CANCELED: C-reactive protein, CANCELED: SARS-CoV-2 Semi-Quantitative Total Antibody, Spike, CANCELED: Sedimentation rate  Post-COVID chronic fatigue - Plan: Comprehensive metabolic panel, C-reactive protein, Iron, TIBC and Ferritin Panel, Magnesium, SARS-CoV-2 Semi-Quantitative Total Antibody, Spike, Sedimentation rate, T4, free, TSH, Vitamin B12, C-reactive protein, Iron, TIBC and Ferritin Panel, Magnesium, SARS-CoV-2 Semi-Quantitative Total Antibody, Spike, Vitamin B12, TSH, T4, free, Sedimentation rate, Comprehensive metabolic panel, CANCELED: TSH, CANCELED: T4, free, CANCELED: Comprehensive metabolic panel, CANCELED: Vitamin B12, CANCELED: Magnesium, CANCELED: Iron, TIBC and Ferritin Panel, CANCELED: C-reactive protein, CANCELED: SARS-CoV-2 Semi-Quantitative Total Antibody, Spike, CANCELED: Sedimentation rate  Hypothyroidism, unspecified type - Plan: Comprehensive metabolic panel, C-reactive protein, Iron, TIBC and Ferritin Panel, Magnesium, SARS-CoV-2 Semi-Quantitative Total Antibody, Spike, Sedimentation rate, T4, free, TSH, Vitamin B12, C-reactive protein, Iron, TIBC and Ferritin Panel, Magnesium, SARS-CoV-2 Semi-Quantitative Total Antibody, Spike, Vitamin B12, TSH, T4, free, Sedimentation rate, Comprehensive metabolic panel, CANCELED: TSH, CANCELED: T4, free, CANCELED: Comprehensive metabolic panel, CANCELED: Vitamin B12, CANCELED: Magnesium, CANCELED: Iron, TIBC and Ferritin Panel, CANCELED: C-reactive protein, CANCELED: SARS-CoV-2 Semi-Quantitative Total Antibody, Spike, CANCELED: Sedimentation rate  Hyperlipidemia, unspecified hyperlipidemia type - Plan: Sedimentation rate, Sedimentation rate, CANCELED: Sedimentation rate  Medication management - Plan: Comprehensive metabolic panel, C-reactive protein, Iron, TIBC and Ferritin Panel, Magnesium, SARS-CoV-2 Semi-Quantitative Total Antibody, Spike,  Sedimentation rate, T4, free, TSH, Vitamin B12, VITAMIN D 25 Hydroxy (Vit-D Deficiency, Fractures), C-reactive protein, Iron, TIBC and Ferritin Panel, Magnesium, SARS-CoV-2 Semi-Quantitative Total Antibody, Spike, VITAMIN D 25 Hydroxy (Vit-D Deficiency, Fractures), Vitamin B12, TSH, T4, free, Sedimentation rate, Comprehensive metabolic panel, CANCELED: TSH, CANCELED: T4, free, CANCELED: Comprehensive metabolic panel, CANCELED: Vitamin B12, CANCELED: Magnesium, CANCELED: Iron, TIBC and Ferritin Panel, CANCELED: C-reactive protein, CANCELED: SARS-CoV-2 Semi-Quantitative Total Antibody, Spike, CANCELED: VITAMIN D 25 Hydroxy (Vit-D Deficiency, Fractures), CANCELED: Sedimentation rate  Hiccoughs - post prandial. - Plan: Comprehensive metabolic panel, C-reactive protein, Iron, TIBC and Ferritin Panel, Magnesium, SARS-CoV-2 Semi-Quantitative Total Antibody, Spike, Sedimentation rate, T4, free, TSH, Vitamin B12, C-reactive protein, Iron, TIBC and Ferritin Panel, Magnesium, SARS-CoV-2 Semi-Quantitative Total Antibody, Spike, Vitamin B12, TSH, T4, free,  Sedimentation rate, Comprehensive metabolic panel, CANCELED: TSH, CANCELED: T4, free, CANCELED: Comprehensive metabolic panel, CANCELED: Vitamin B12, CANCELED: Magnesium, CANCELED: Iron, TIBC and Ferritin Panel, CANCELED: C-reactive protein, CANCELED: SARS-CoV-2 Semi-Quantitative Total Antibody, Spike, CANCELED: Sedimentation rate  Personal history of COVID-19 - Plan: Comprehensive metabolic panel, C-reactive protein, Iron, TIBC and Ferritin Panel, Magnesium, SARS-CoV-2 Semi-Quantitative Total Antibody, Spike, Sedimentation rate, T4, free, TSH, Vitamin B12, C-reactive protein, Iron, TIBC and Ferritin Panel, Magnesium, SARS-CoV-2 Semi-Quantitative Total Antibody, Spike, Vitamin B12, TSH, T4, free, Sedimentation rate, Comprehensive metabolic panel, CANCELED: TSH, CANCELED: T4, free, CANCELED: Comprehensive metabolic panel, CANCELED: Vitamin B12, CANCELED: Magnesium,  CANCELED: Iron, TIBC and Ferritin Panel, CANCELED: C-reactive protein, CANCELED: SARS-CoV-2 Semi-Quantitative Total Antibody, Spike, CANCELED: Sedimentation rate  Low magnesium level - Plan: Sedimentation rate, VITAMIN D 25 Hydroxy (Vit-D Deficiency, Fractures), VITAMIN D 25 Hydroxy (Vit-D Deficiency, Fractures), Sedimentation rate, CANCELED: VITAMIN D 25 Hydroxy (Vit-D Deficiency, Fractures), CANCELED: Sedimentation rate  Need for immunization against influenza - Plan: Flu Vaccine QUAD High Dose(Fluad) A1c and blood pressure are in control. She was on twice daily PPI which may be more negative than positive at this time.  Uncertain if it really helped her cough. Advise decreasing wean to 1 a day and plan after that. Check metabolic factors such as B12 magnesium vitamin D.  Her last chest x-ray was in July was normal. Her history of COVID was GI predominant symptoms although had a cough that was difficult. On review of records she did have esophageal stricture years ago and placed on a PPI 2005 per Dr. Juanda Chance.  Has no obstructive symptoms at this point. -Patient advised to return or notify health care team  if  new concerns arise. Review of record assessment counsel ordering 45 minutes. Patient Instructions  Checking a number of labs including B12 and iron. Decrease the Protonix to once a day weaning and is possible that that decreases the magnesium absorption. Looking for other causes of fatigue after COVID infection that are correctable. Depending on your lab results we may refer to either GI or pulmonary to help ascertain why you were getting hiccups after eating.  Your blood sugar is better.  And blood pressure seems controlled    Kerry Mortimer K.  M.D.

## 2020-09-07 ENCOUNTER — Ambulatory Visit (INDEPENDENT_AMBULATORY_CARE_PROVIDER_SITE_OTHER): Payer: Medicare Other | Admitting: Internal Medicine

## 2020-09-07 ENCOUNTER — Other Ambulatory Visit: Payer: Self-pay

## 2020-09-07 ENCOUNTER — Encounter: Payer: Self-pay | Admitting: Internal Medicine

## 2020-09-07 VITALS — BP 124/82 | HR 72 | Temp 97.8°F | Ht 61.75 in | Wt 156.6 lb

## 2020-09-07 DIAGNOSIS — D509 Iron deficiency anemia, unspecified: Secondary | ICD-10-CM | POA: Diagnosis not present

## 2020-09-07 DIAGNOSIS — M9902 Segmental and somatic dysfunction of thoracic region: Secondary | ICD-10-CM | POA: Diagnosis not present

## 2020-09-07 DIAGNOSIS — M5134 Other intervertebral disc degeneration, thoracic region: Secondary | ICD-10-CM | POA: Diagnosis not present

## 2020-09-07 DIAGNOSIS — U099 Post covid-19 condition, unspecified: Secondary | ICD-10-CM

## 2020-09-07 DIAGNOSIS — E039 Hypothyroidism, unspecified: Secondary | ICD-10-CM | POA: Diagnosis not present

## 2020-09-07 DIAGNOSIS — K219 Gastro-esophageal reflux disease without esophagitis: Secondary | ICD-10-CM

## 2020-09-07 DIAGNOSIS — R790 Abnormal level of blood mineral: Secondary | ICD-10-CM

## 2020-09-07 DIAGNOSIS — E1165 Type 2 diabetes mellitus with hyperglycemia: Secondary | ICD-10-CM | POA: Diagnosis not present

## 2020-09-07 DIAGNOSIS — R066 Hiccough: Secondary | ICD-10-CM

## 2020-09-07 DIAGNOSIS — E785 Hyperlipidemia, unspecified: Secondary | ICD-10-CM

## 2020-09-07 DIAGNOSIS — Z79899 Other long term (current) drug therapy: Secondary | ICD-10-CM | POA: Diagnosis not present

## 2020-09-07 DIAGNOSIS — R5382 Chronic fatigue, unspecified: Secondary | ICD-10-CM

## 2020-09-07 DIAGNOSIS — Z23 Encounter for immunization: Secondary | ICD-10-CM | POA: Diagnosis not present

## 2020-09-07 DIAGNOSIS — M9903 Segmental and somatic dysfunction of lumbar region: Secondary | ICD-10-CM | POA: Diagnosis not present

## 2020-09-07 DIAGNOSIS — Z8616 Personal history of COVID-19: Secondary | ICD-10-CM | POA: Diagnosis not present

## 2020-09-07 DIAGNOSIS — I10 Essential (primary) hypertension: Secondary | ICD-10-CM | POA: Diagnosis not present

## 2020-09-07 DIAGNOSIS — M5136 Other intervertebral disc degeneration, lumbar region: Secondary | ICD-10-CM | POA: Diagnosis not present

## 2020-09-07 LAB — COMPREHENSIVE METABOLIC PANEL
ALT: 24 U/L (ref 0–35)
AST: 23 U/L (ref 0–37)
Albumin: 4.3 g/dL (ref 3.5–5.2)
Alkaline Phosphatase: 73 U/L (ref 39–117)
BUN: 19 mg/dL (ref 6–23)
CO2: 33 mEq/L — ABNORMAL HIGH (ref 19–32)
Calcium: 10.2 mg/dL (ref 8.4–10.5)
Chloride: 104 mEq/L (ref 96–112)
Creatinine, Ser: 0.93 mg/dL (ref 0.40–1.20)
GFR: 59.68 mL/min — ABNORMAL LOW (ref >60.00)
Glucose, Bld: 94 mg/dL (ref 70–99)
Potassium: 4 mEq/L (ref 3.5–5.1)
Sodium: 142 mEq/L (ref 135–145)
Total Bilirubin: 0.4 mg/dL (ref 0.2–1.2)
Total Protein: 6.6 g/dL (ref 6.0–8.3)

## 2020-09-07 LAB — POCT GLYCOSYLATED HEMOGLOBIN (HGB A1C): Hemoglobin A1C: 6.4 % — AB (ref 4.0–5.6)

## 2020-09-07 LAB — MAGNESIUM: Magnesium: 1.7 mg/dL (ref 1.5–2.5)

## 2020-09-07 LAB — VITAMIN D 25 HYDROXY (VIT D DEFICIENCY, FRACTURES): VITD: 89.59 ng/mL (ref 30.00–100.00)

## 2020-09-07 LAB — T4, FREE: Free T4: 1.42 ng/dL (ref 0.60–1.60)

## 2020-09-07 LAB — TSH: TSH: 2.87 u[IU]/mL (ref 0.35–4.50)

## 2020-09-07 LAB — C-REACTIVE PROTEIN: CRP: <1 mg/dL (ref 0.5–20.0)

## 2020-09-07 LAB — SEDIMENTATION RATE: Sed Rate: 31 mm/hr — ABNORMAL HIGH (ref 0–30)

## 2020-09-07 LAB — VITAMIN B12: Vitamin B-12: 311 pg/mL (ref 211–911)

## 2020-09-07 NOTE — Patient Instructions (Signed)
Checking a number of labs including B12 and iron. Decrease the Protonix to once a day weaning and is possible that that decreases the magnesium absorption. Looking for other causes of fatigue after COVID infection that are correctable. Depending on your lab results we may refer to either GI or pulmonary to help ascertain why you were getting hiccups after eating.  Your blood sugar is better.  And blood pressure seems controlled

## 2020-09-11 LAB — IRON,TIBC AND FERRITIN PANEL
%SAT: 14 % (calc) — ABNORMAL LOW (ref 16–45)
Ferritin: 14 ng/mL — ABNORMAL LOW (ref 16–288)
Iron: 52 ug/dL (ref 45–160)
TIBC: 365 mcg/dL (calc) (ref 250–450)

## 2020-09-11 LAB — SARS-COV-2 SEMI-QUANTITATIVE TOTAL ANTIBODY, SPIKE: SARS COV2 AB, Total Spike Semi QN: >2500 U/mL — ABNORMAL HIGH (ref <0.8)

## 2020-09-14 NOTE — Progress Notes (Signed)
Blood results  show high antibodies to  sars cov2 covid 19    Slightly low iron level.  B12  normal on low side . Suggest taking Multivitamin with iron . And b12 in it  Magnesium is low normal   Please place referral to GI for  dx  Hiccoughs mild iron deficiency   gerd .

## 2020-09-16 NOTE — Addendum Note (Signed)
Addended by: Johnella Moloney on: 09/16/2020 04:19 PM   Modules accepted: Orders

## 2020-09-18 ENCOUNTER — Telehealth: Payer: Medicare Other

## 2020-09-18 NOTE — Chronic Care Management (AMB) (Unsigned)
Chronic Care Management Pharmacy  Name: Kerry Walters  MRN: [id number] DOB: 17-Nov-1945  Initial Questions: 1. Have you seen any other providers since your last visit? N/A 2. Any changes in your medicines or health? No   Chief Complaint/ HPI Kerry Walters,  75 y.o. , female presents for their Initial CCM visit with the clinical pharmacist via telephone due to COVID-19 Pandemic.  Patient reports being busy most days that she has not had a chance to check blood pressure. She plans to start checking blood pressure on days she is not as busy.   Patient reports being prediabetic for many years. She reports watching her sugar intake and reports she eats better now than when she was younger.  She reports current dose of pantoprazole is helping control her acid reflux pretty well with occasional flare. She reports taking for a year and a half.  PCP : Burnis Medin, MD  Their chronic conditions include: HTN, DM, HLD, Hypothyroidism, GERD, noctural leg cramps, cystitis preventio  Office Visits: 09/07/2020 Shanon Ace, MD: Patient presented for follow up for chronic conditions. Referral placed for GI.  03/03/2020- Shanon Ace, MD- Patient presented for office visit for annual exam. Patient to obtain routine blood work. Patient instructed to check BP readings BID for 3 to 5 days. Patient to obtain chest xray.   09/04/2019- Shanon Ace, MD- Patient presented for office visit for 6 month follow up. Patient to obtain routine blood work. BMET, CBC, A1c, hepatic panel, lipid panel, TSH, microalbumin. Patient to follow up in 6 months.   Consult Visit: 05/25/20 Chiropractic medicine- Marrion Coy- Unable to access notes.   10/02/19- 11/19/2019- Chiropractic medicine- Marrion Coy- Unable to access notes.   10/24/2019- Ophthalmology- Clent Jacks- Unable to access notes.   Medications: Outpatient Encounter Medications as of 09/18/2020  Medication Sig Note  . atorvastatin (LIPITOR) 20 MG tablet TAKE  1 TABLET BY MOUTH IN  THE EVENING   . Coenzyme Q10 (COQ-10) 400 MG CAPS Take 1 capsule by mouth daily.   [name] conjugated estrogens (PREMARIN) vaginal cream Place 1 Applicatorful vaginally as directed. Use 2-3 times weekly  prn   . gabapentin (NEURONTIN) 100 MG capsule TAKE 1 TO 3 CAPSULES BY  MOUTH AT BEDTIME AS  DIRECTED   . levothyroxine (SYNTHROID) 75 MCG tablet TAKE 1 TABLET BY MOUTH  DAILY   . Magnesium 500 MG TABS Take 3 tablets by mouth daily.   . metFORMIN (GLUCOPHAGE-XR) 500 MG 24 hr tablet TAKE 3 TABLETS BY MOUTH  DAILY   . pantoprazole (PROTONIX) 40 MG tablet TAKE 1 TABLET BY MOUTH TWO  TIMES DAILY   . pyridOXINE (VITAMIN B-6) 100 MG tablet Take 100 mg by mouth daily.   . TURMERIC CURCUMIN PO Take 1 tablet by mouth daily. 03/24/2020: 450/50 mg  . valsartan-hydrochlorothiazide (DIOVAN-HCT) 160-25 MG tablet TAKE 1 TABLET BY MOUTH  DAILY    Facility-Administered Encounter Medications as of 09/18/2020  Medication  . betamethasone acetate-betamethasone sodium phosphate (CELESTONE) injection 3 mg  . betamethasone acetate-betamethasone sodium phosphate (CELESTONE) injection 3 mg     Current Diagnosis/Assessment:  Goals Addressed   None        Hypertension    Office blood pressures are  BP Readings from Last 3 Encounters:  09/07/20 124/82  03/03/20 (!) 142/86  09/04/19 124/62   Kidney Function Lab Results  Component Value Date/Time   CREATININE 0.93 09/07/2020 11:55 AM   CREATININE 1.02 03/03/2020 09:26 AM   GFR 59.68 (L) 09/07/2020 11:55  AM   GFRNONAA 71.02 08/03/2010 08:34 AM   GFRAA  11/19/2009 06:00 AM    >60        The eGFR has been calculated using the MDRD equation. This calculation has not been validated in all clinical situations. eGFR's persistently <60 mL/min signify possible Chronic Kidney Disease.   K 4.0 09/07/2020 11:55 AM   K 3.8 03/03/2020 09:26 AM    Patient has failed these meds in the past: triamterene, losartan  Patient checks BP at home  reports has not had time to check   Patient home BP readings are ranging: N/A  Patient is on: (unable to assess)  Valsartan/ hydrochlorothiazide 160/12m, 1 tablet once daily   We discussed importance of home blood pressure monitoring and recommendation from Dr. PRegis Billat last visit   Plan Continue current medications  Follow up in 1 month for BP reassessment.     Diabetes   Recent Relevant Labs: Lab Results  Component Value Date/Time   HGBA1C 6.4 (A) 09/07/2020 11:10 AM   HGBA1C 6.6 (H) 03/03/2020 09:26 AM   HGBA1C 6.6 (A) 09/04/2019 10:17 AM   HGBA1C 6.7 (H) 03/04/2019 10:45 AM   MICROALBUR 21.7 (H) 03/03/2020 09:26 AM   MICROALBUR 7.9 (H) 03/04/2019 10:45 AM    Patient has failed these meds in past: none  Patient is currently controlled on the following medications:   Metformin ER 5079m 3 tablets once daily (with breakfast)   Last diabetic Eye exam:  Lab Results  Component Value Date/Time   HMDIABEYEEXA No Retinopathy 10/24/2019 12:00 AM    Last diabetic Foot exam: No results found for: HMDIABFOOTEX   We discussed: diet and exercise extensively and discussed diagnosis of diabetes in relation to A1c   Plan Continue current medications and control with diet and exercise   Hyperlipidemia   LDL goal < 100   Lipid Panel     Component Value Date/Time   CHOL 142 03/03/2020 0926   TRIG 100.0 03/03/2020 0926   HDL 46.00 03/03/2020 0926   LDLCALC 76 03/03/2020 0926   LDLDIRECT 101.0 03/04/2019 1045    Hepatic Function Latest Ref Rng & Units 09/07/2020 03/03/2020 03/04/2019  Total Protein 6.0 - 8.3 g/dL 6.6 6.7 6.9  Albumin 3.5 - 5.2 g/dL 4.3 4.2 4.3  AST 0 - 37 U/L _0 ALT 0 - 35 U/L _1 Alk Phosphatase 39 - 117 U/L 73 77 83  Total Bilirubin 0.2 - 1.2 mg/dL 0.4 0.5 0.3  Bilirubin, Direct 0.0 - 0.3 mg/dL - 0.1 0.1     The 10-year ASCVD risk score (GMikey BussingC Jr., et al., 2013) is: 37.3%   Values used to calculate the score:     Age: [age]     Sex:  Female     Is Non-Hispanic African American: No     Diabetic: Yes     Tobacco smoker: No     Systolic Blood Pressure: 12993mHg     Is BP treated: Yes     HDL Cholesterol: 46 mg/dL     Total Cholesterol: 142 mg/dL   Patient has failed these meds in past: none  Patient is currently controlled on the following medications:   Atorvastatin 2070m1 tablet in the evening  We discussed:  diet and exercise extensively . We discussed how a diet high in plant sterols (fruits/vegetables/nuts/whole grains/legumes) may reduce your cholesterol.  Encouraged increasing fiber to a daily intake of 10-25g/day  Plan Continue current medications  Hypothyroidism   Lab Results  Component Value Date/Time   TSH 2.87 09/07/2020 11:55 AM   TSH 4.49 03/03/2020 09:26 AM   FREET4 1.42 09/07/2020 11:55 AM    Patient is currently controlled on the following medications:  . Levothyroxine 34mg, 1 tablet once daily (1 hour before breakfast)   We discussed:  administration of levothyroxine in regards to food and other medications (1 hour at least 30 minutes before first meal)   Plan Continue current medications  GERD   Patient has failed these meds in past: omeprazole   Patient is currently controlled on the following medications:  . Pantoprazole 437m 1 tablet twice daily  - taking?  We discussed:   -non-pharmacological interventions for acid reflux. Take measures to prevent acid reflux, such as avoiding spicy foods, avoiding caffeine, avoid laying down a few hours after eating, and raising the head of the bed. - discussed risks versus benefits of long term PPIs    Plan Continue current medications Reassess at follow up.   Nocturnal leg cramps    Reports helping with "crawling feeling in legs"   Patient is currently controlled on the following medications:  . Gabapentin 10068m1 capsule at bedtime   Plan Continue current medications  Cystitis prevention    Notes getting cystitis  frequently in the past and was recommended by urologist.   Patient is currently controlled on the following medications:  . Conjugated estrogens (Premarin) vaginal cream, apply 2 to 3 times weekly as needed   Plan Managed by urologist.  Continue current medications   OTC/ supplements    Patient is currently on the following medications:   Vitamin B6 100m15m tablet once daily  Magnesium oxide 500mg10mcreased to 3 tablets/ day  turmeric cucurmin 450mg/41m- 1 tablet once daily  CoQ-10 400mg, 56mblet once daily   Vitamin B12   Iron?  Plan Continue current medications    Vaccines   Reviewed and discussed patient's vaccination history.    Immunization History  Administered Date(s) Administered  . Fluad Quad(high Dose 65+) 09/07/2020  . Influenza Split 07/10/2013  . Influenza Whole 06/29/2007, 06/16/2008, 07/08/2009, 08/03/2010  . Influenza, High Dose Seasonal PF 07/17/2014, 05/18/2015, 08/23/2016, 06/30/2017, 07/02/2018, 06/17/2019  . Moderna Sars-Covid-2 Vaccination 10/23/2019, 11/20/2019  . Pneumococcal Conjugate-13 11/05/2013  . Pneumococcal Polysaccharide-23 08/11/2015  . Td 08/30/2003, 11/24/2014  . Zoster 04/27/2012  . Zoster Recombinat (Shingrix) 06/17/2019    Plan Patient to verify 2nd dose of shingles vaccine    Medication Management   [facility] [address] [phone] box? No - patient reports having own strategy for managing medications. Takes BP med after breakfast and will put them up after taking them. Reports not using pill box.  Pt endorses ***% compliance  We discussed: {Pharmacy options:24294}  Plan  {US Pharmacy Plan:[id number]}low up: *** month phone visit  MadelinJeni SallesD BCACP CWolfe Citycist [phone]

## 2020-09-23 DIAGNOSIS — M5134 Other intervertebral disc degeneration, thoracic region: Secondary | ICD-10-CM | POA: Diagnosis not present

## 2020-09-23 DIAGNOSIS — M5136 Other intervertebral disc degeneration, lumbar region: Secondary | ICD-10-CM | POA: Diagnosis not present

## 2020-09-23 DIAGNOSIS — M9903 Segmental and somatic dysfunction of lumbar region: Secondary | ICD-10-CM | POA: Diagnosis not present

## 2020-09-23 DIAGNOSIS — M9902 Segmental and somatic dysfunction of thoracic region: Secondary | ICD-10-CM | POA: Diagnosis not present

## 2020-10-01 ENCOUNTER — Other Ambulatory Visit: Payer: Self-pay | Admitting: Internal Medicine

## 2020-10-06 ENCOUNTER — Telehealth: Payer: Medicare Other

## 2020-10-07 DIAGNOSIS — M9902 Segmental and somatic dysfunction of thoracic region: Secondary | ICD-10-CM | POA: Diagnosis not present

## 2020-10-07 DIAGNOSIS — M9903 Segmental and somatic dysfunction of lumbar region: Secondary | ICD-10-CM | POA: Diagnosis not present

## 2020-10-07 DIAGNOSIS — M5136 Other intervertebral disc degeneration, lumbar region: Secondary | ICD-10-CM | POA: Diagnosis not present

## 2020-10-07 DIAGNOSIS — M5134 Other intervertebral disc degeneration, thoracic region: Secondary | ICD-10-CM | POA: Diagnosis not present

## 2020-10-26 DIAGNOSIS — H401131 Primary open-angle glaucoma, bilateral, mild stage: Secondary | ICD-10-CM | POA: Diagnosis not present

## 2020-10-26 DIAGNOSIS — H47092 Other disorders of optic nerve, not elsewhere classified, left eye: Secondary | ICD-10-CM | POA: Diagnosis not present

## 2020-10-26 DIAGNOSIS — H2513 Age-related nuclear cataract, bilateral: Secondary | ICD-10-CM | POA: Diagnosis not present

## 2020-10-26 DIAGNOSIS — E119 Type 2 diabetes mellitus without complications: Secondary | ICD-10-CM | POA: Diagnosis not present

## 2020-10-26 LAB — HM DIABETES EYE EXAM

## 2020-10-28 DIAGNOSIS — M9902 Segmental and somatic dysfunction of thoracic region: Secondary | ICD-10-CM | POA: Diagnosis not present

## 2020-10-28 DIAGNOSIS — M5136 Other intervertebral disc degeneration, lumbar region: Secondary | ICD-10-CM | POA: Diagnosis not present

## 2020-10-28 DIAGNOSIS — M9903 Segmental and somatic dysfunction of lumbar region: Secondary | ICD-10-CM | POA: Diagnosis not present

## 2020-10-28 DIAGNOSIS — M5134 Other intervertebral disc degeneration, thoracic region: Secondary | ICD-10-CM | POA: Diagnosis not present

## 2020-11-05 ENCOUNTER — Encounter: Payer: Self-pay | Admitting: Gastroenterology

## 2020-11-05 ENCOUNTER — Ambulatory Visit: Payer: Medicare Other | Admitting: Gastroenterology

## 2020-11-05 VITALS — BP 144/88 | HR 72 | Ht 61.75 in | Wt 158.5 lb

## 2020-11-05 DIAGNOSIS — R066 Hiccough: Secondary | ICD-10-CM | POA: Diagnosis not present

## 2020-11-05 DIAGNOSIS — D509 Iron deficiency anemia, unspecified: Secondary | ICD-10-CM

## 2020-11-05 NOTE — Patient Instructions (Addendum)
It was a pleasure to meet you today. Based on our discussion, I am providing you with my recommendations below:  RECOMMENDATION(S):   I have recommended an upper endoscopy to evaluate for esophgeal and gastric causes of hiccups. In the meantime, I recommend that you increase your pantoprazole to 40 mg twice daily.   Possible home treatments for hiccups include:  Drinking water quickly Swallowing a teaspoon of dry granulated sugar Gently pulling on the tongue Gagging (stick a finger down the throat) Gently pressing on your eyeballs Gargling water Holding your breath Breathing into a paper bag (do not use a plastic bag) Drinking from the opposite side of a glass Pulling your knees to your chest or leaning forward to compress the chest   BMI:  . If you are age 24 or older, your body mass index should be between 23-30. Your There is no height or weight on file to calculate BMI. If this is out of the aforementioned range listed, please consider follow up with your Primary Care Provider.  Thank you for trusting me with your gastrointestinal care!    Tressia Danas, MD, MPH

## 2020-11-05 NOTE — Progress Notes (Signed)
Referring Provider: Madelin Headings, MD Primary Care Physician:  Madelin Headings, MD  Reason for Consultation:  Hiccups, reflux, and iron deficiency   IMPRESSION:  Hiccups History of esophageal stricture without obstructive symptoms at this time Hiatal hernia Iron deficiency Diverticulosis without history of diverticulitis  Hiccups: No significant aerophagia or dietary explanation for symptoms. Both esophageal and gastric etiologies can cause hiccups, although non-GI etiologies are also possible. We discussed esophageal and gastric distension as possible culprits. With her history of stricture and hiatal hernia, she likely has reflux esophagitis which is associated with hiccups. Must also consider peptic ulcer disease. Cancer must be excluded, particularly given her iron deficiency.  History of esophageal stricture: No symptoms that she associates, although hiccups may be related. We discussed possible dilation at time of EGD.   Discussed colon cancer screening. She is still in prevention mode getting annual mammograms. I encouraged her to consider colon cancer screening. She wished to discuss further with Dr. Fabian Sharp.   Iron deficiency: Without anemia. No overt bleeding. Will evaluate for possible GI blood loss at time of EGD. Encouraged colonoscopy.   Diverticulosis: High fiber diet recommended.    PLAN: - Encouraged oral iron supplements QOD - EGD with biopsies +/- dilation - Consider colon cancer screening given her age and good health  Please see the "Patient Instructions" section for addition details about the plan.  I spent 45 minutes, including in depth chart review, independent review of results, communicating results with the patient directly, face-to-face time with the patient, coordinating care, ordering studies and medications as appropriate, and documentation.   HPI: Kerry Walters is a 75 y.o. female referred by Dr. Fabian Sharp for hiccups, reflux, and iron deficiency.  Retired from the [employer], [employer] and exercise in a [employer].  She has a history of childhood asthma, diabetes, hypertension, and kidney stones.   Developed near daily hiccups that occur every time that she eats as well as episodes that occur without eating over the last 6 months. No identified food triggers. Not associated with overeating. Rare carbonated beverages. Rare use of chewing gum. She does not smoke. No heartburn, dysphagia, dysphonia. Has a frequent cough that she can control if her head is elevated.  No concurrent headache or neurologic symptoms.  She has had indigestion over the years, but, feels that it hasn't responded to medical treatment in the past. She also wishes to avoid medications.   EGD 10/22/2003: Esophageal stricture dilated from 14-42mm savary dilator, small hiatal hernia, and biopsy-proven reflux Colonoscopy with Dr. Juanda Chance 12/22/2004: diverticulosis in the left colon Cologuard 08/31/16: negative She declined further colonoscopy or Cologuard given her age.   Labs 09/07/20: iron 52, ferritin 14, hemoglobin 12.6, MCV 85.6, RDW 15.1, platelets 291  CT abd/pelvis with contrast 09/11/14 for abdominal pain showed diverticulosis, renal calculi with possible UPJ stricture, large hiatal hernia  No known family history of colon cancer or polyps. No family history of uterine/endometrial cancer, pancreatic cancer or gastric/stomach cancer.   Past Medical History:  Diagnosis Date  . Asthma due to environmental allergies    no inhaler  . Diverticulosis of colon   . GERD (gastroesophageal reflux disease)   . History of esophageal dilatation    FOR STRICTURE IN 2005  . History of hiatal hernia   . History of hypercalcemia    SECONDARY TO HYPERPARATHYROID  . History of hyperparathyroidism    PRIMARY--  S/P RIGHT PARATHYROIDECTOMY  . History of kidney stones   . History of recurrent UTIs   .  Hyperlipidemia   . Hypertension   . Hypothyroidism   . Incomplete right bundle branch block   .  Nephrolithiasis    RIGHT   . Nocturia   . PONV (postoperative nausea and vomiting)   . Pre-diabetes   . Sciatica of right side     Past Surgical History:  Procedure Laterality Date  . APPENDECTOMY  1974  . CYSTOSCOPY WITH URETEROSCOPY AND STENT PLACEMENT Right 11/11/2014   Procedure: CYSTOSCOPY WITH URETEROSCOPY AND STENT PLACEMENT;  Surgeon: Crist Fat, MD;  Location: [facility];  Service: Urology;  Laterality: Right;  . EXCISION URETHRAL CARUNCLE  1981   Open procedure  . KNEE ARTHROSCOPY Left april 2010  . REMOVAL RIP  1973   First Rib on Right side-- for impingement  . RIGHT INFERIOR PARATHYROIDECTOMY, MINIMALLY INVASIVE  08-14-2008  . TONSILLECTOMY  as child  . TOTAL KNEE ARTHROPLASTY Left 11-17-2009  . TUBAL LIGATION  1973  . VAGINAL HYSTERECTOMY  1995   w/ Bilateral Salpingoophorectomy    Current Outpatient Medications  Medication Sig Dispense Refill  . atorvastatin (LIPITOR) 20 MG tablet TAKE 1 TABLET BY MOUTH IN  THE EVENING 90 tablet 1  . cholecalciferol (VITAMIN D3) 25 MCG (1000 UNIT) tablet Take 1,000 Units by mouth daily.    . Coenzyme Q10 (COQ-10) 400 MG CAPS Take 1 capsule by mouth daily.    [name] conjugated estrogens (PREMARIN) vaginal cream Place 1 Applicatorful vaginally as directed. Use 2-3 times weekly  prn 42.5 g 2  . gabapentin (NEURONTIN) 100 MG capsule TAKE 1 TO 3 CAPSULES BY  MOUTH AT BEDTIME AS  DIRECTED 270 capsule 1  . levothyroxine (SYNTHROID) 75 MCG tablet TAKE 1 TABLET BY MOUTH  DAILY 90 tablet 3  . Magnesium 500 MG TABS Take 2 tablets by mouth daily.    . metFORMIN (GLUCOPHAGE-XR) 500 MG 24 hr tablet TAKE 3 TABLETS BY MOUTH  DAILY 270 tablet 1  . pantoprazole (PROTONIX) 40 MG tablet Take 40 mg by mouth daily.    [name] pyridOXINE (VITAMIN B-6) 100 MG tablet Take 100 mg by mouth daily.    . TURMERIC CURCUMIN PO Take 1 tablet by mouth daily.    . valsartan-hydrochlorothiazide (DIOVAN-HCT) 160-25 MG tablet TAKE 1 TABLET BY MOUTH  DAILY  90 tablet 2   Current Facility-Administered Medications  Medication Dose Route Frequency Provider Last Rate Last Admin  . betamethasone acetate-betamethasone sodium phosphate (CELESTONE) injection 3 mg  3 mg Intramuscular Once Gala Lewandowsky M, DPM      . betamethasone acetate-betamethasone sodium phosphate (CELESTONE) injection 3 mg  3 mg Intramuscular Once Felecia Shelling, DPM        Allergies as of 11/05/2020 - Review Complete 11/05/2020  Allergen Reaction Noted  . Contrast media [iodinated diagnostic agents] Shortness Of Breath 09/10/2014  . Codeine Nausea Only 11/06/2014  . Shrimp [shellfish allergy] Swelling 11/07/2014  . Topamax [topiramate] Rash 03/04/2019    Family History  Problem Relation Age of Onset  . Cancer Father        brain  . Cancer Son        liver  . Diabetes Sister   . Schizophrenia Son   . Colon cancer Neg Hx   . Colon polyps Neg Hx   . Esophageal cancer Neg Hx   . Stomach cancer Neg Hx   . Pancreatic cancer Neg Hx      Review of Systems: 12 system ROS is negative except as noted above with the addition  of allergies, arthritis, back pain, cough, headaches, and insomnia.   Physical Exam: General:   Alert,  well-nourished, pleasant and cooperative in NAD Head:  Normocephalic and atraumatic. Eyes:  Sclera clear, no icterus.   Conjunctiva pink. Ears:  Normal auditory acuity. Nose:  No deformity, discharge,  or lesions. Mouth:  No deformity or lesions.   Neck:  Supple; no masses or thyromegaly. Lungs:  Clear throughout to auscultation.   No wheezes. Heart:  Regular rate and rhythm; no murmurs. Abdomen:  Soft, nontender, nondistended, normal bowel sounds, no rebound or guarding. No hepatosplenomegaly.   Rectal:  Deferred  Msk:  Symmetrical. No boney deformities LAD: No inguinal or umbilical LAD Extremities:  No clubbing or edema. Neurologic:  Alert and  oriented x4;  grossly nonfocal Skin:  Intact without significant lesions or rashes. Psych:  Alert  and cooperative. Normal mood and affect.    Kimberly L. Orvan Falconer, MD, MPH 11/05/2020, 2:47 PM

## 2020-11-09 ENCOUNTER — Telehealth: Payer: Self-pay | Admitting: Internal Medicine

## 2020-11-09 DIAGNOSIS — M5134 Other intervertebral disc degeneration, thoracic region: Secondary | ICD-10-CM | POA: Diagnosis not present

## 2020-11-09 DIAGNOSIS — M9903 Segmental and somatic dysfunction of lumbar region: Secondary | ICD-10-CM | POA: Diagnosis not present

## 2020-11-09 DIAGNOSIS — M9902 Segmental and somatic dysfunction of thoracic region: Secondary | ICD-10-CM | POA: Diagnosis not present

## 2020-11-09 DIAGNOSIS — M5136 Other intervertebral disc degeneration, lumbar region: Secondary | ICD-10-CM | POA: Diagnosis not present

## 2020-11-09 NOTE — Telephone Encounter (Signed)
Left message for patient to call back and schedule Medicare Annual Wellness Visit (AWV) either virtually or in office. No detailed message left   Last AWV 08/13/16  please schedule at anytime with [facility] Nurse Health Advisor 1 or 2   This should be a 45 minute visit.

## 2020-11-24 ENCOUNTER — Ambulatory Visit (AMBULATORY_SURGERY_CENTER): Payer: Medicare Other | Admitting: Gastroenterology

## 2020-11-24 ENCOUNTER — Encounter: Payer: Self-pay | Admitting: Gastroenterology

## 2020-11-24 ENCOUNTER — Other Ambulatory Visit: Payer: Self-pay

## 2020-11-24 ENCOUNTER — Encounter: Payer: Medicare Other | Admitting: Gastroenterology

## 2020-11-24 VITALS — BP 127/79 | HR 57 | Temp 97.9°F | Resp 13 | Ht 61.75 in | Wt 158.0 lb

## 2020-11-24 DIAGNOSIS — K2970 Gastritis, unspecified, without bleeding: Secondary | ICD-10-CM

## 2020-11-24 DIAGNOSIS — K222 Esophageal obstruction: Secondary | ICD-10-CM | POA: Diagnosis not present

## 2020-11-24 DIAGNOSIS — K3189 Other diseases of stomach and duodenum: Secondary | ICD-10-CM

## 2020-11-24 DIAGNOSIS — K319 Disease of stomach and duodenum, unspecified: Secondary | ICD-10-CM

## 2020-11-24 DIAGNOSIS — K317 Polyp of stomach and duodenum: Secondary | ICD-10-CM

## 2020-11-24 DIAGNOSIS — K2950 Unspecified chronic gastritis without bleeding: Secondary | ICD-10-CM | POA: Diagnosis not present

## 2020-11-24 DIAGNOSIS — D649 Anemia, unspecified: Secondary | ICD-10-CM | POA: Diagnosis not present

## 2020-11-24 DIAGNOSIS — K31A11 Gastric intestinal metaplasia without dysplasia, involving the antrum: Secondary | ICD-10-CM | POA: Diagnosis not present

## 2020-11-24 DIAGNOSIS — K219 Gastro-esophageal reflux disease without esophagitis: Secondary | ICD-10-CM

## 2020-11-24 DIAGNOSIS — R066 Hiccough: Secondary | ICD-10-CM

## 2020-11-24 MED ORDER — SODIUM CHLORIDE 0.9 % IV SOLN: 500.0000 mL | Freq: Once | INTRAVENOUS | Status: DC

## 2020-11-24 NOTE — Progress Notes (Signed)
VS taken by C.W. 

## 2020-11-24 NOTE — Progress Notes (Signed)
Called to room to assist during endoscopic procedure.  Patient ID and intended procedure confirmed with present staff. Received instructions for my participation in the procedure from the performing physician.  

## 2020-11-24 NOTE — Progress Notes (Signed)
pt tolerated well. VSS. awake and to recovery. Report given to RN. Bite block left insitu to recovery. No trauma noted. 

## 2020-11-24 NOTE — Patient Instructions (Addendum)
Handouts given for stricture and Hiatal hernia.  Avoid all NSAIDS (Aspirin, Ibuprofen, Aleve, Naproxen)  Await pathology results.  Follow up with Dr. Orvan Falconer in the office, they will call to arrange.  YOU HAD AN ENDOSCOPIC PROCEDURE TODAY AT THE [facility]:   Refer to the procedure report that was given to you for any specific questions about what was found during the examination.  If the procedure report does not answer your questions, please call your gastroenterologist to clarify.  If you requested that your care partner not be given the details of your procedure findings, then the procedure report has been included in a sealed envelope for you to review at your convenience later.  YOU SHOULD EXPECT: Some feelings of bloating in the abdomen. Passage of more gas than usual.  Walking can help get rid of the air that was put into your GI tract during the procedure and reduce the bloating. If you had a lower endoscopy (such as a colonoscopy or flexible sigmoidoscopy) you may notice spotting of blood in your stool or on the toilet paper. If you underwent a bowel prep for your procedure, you may not have a normal bowel movement for a few days.  Please Note:  You might notice some irritation and congestion in your nose or some drainage.  This is from the oxygen used during your procedure.  There is no need for concern and it should clear up in a day or so.  SYMPTOMS TO REPORT IMMEDIATELY:   Following upper endoscopy (EGD)  Vomiting of blood or coffee ground material  New chest pain or pain under the shoulder blades  Painful or persistently difficult swallowing  New shortness of breath  Fever of 100F or higher  Black, tarry-looking stools  For urgent or emergent issues, a gastroenterologist can be reached at any hour by calling [phone]. Do not use MyChart messaging for urgent concerns.    DIET:  We do recommend a small meal at first, but then you may proceed to your  regular diet.  Drink plenty of fluids but you should avoid alcoholic beverages for 24 hours.  ACTIVITY:  You should plan to take it easy for the rest of today and you should NOT DRIVE or use heavy machinery until tomorrow (because of the sedation medicines used during the test).    FOLLOW UP: Our staff will call the number listed on your records 48-72 hours following your procedure to check on you and address any questions or concerns that you may have regarding the information given to you following your procedure. If we do not reach you, we will leave a message.  We will attempt to reach you two times.  During this call, we will ask if you have developed any symptoms of COVID 19. If you develop any symptoms (ie: fever, flu-like symptoms, shortness of breath, cough etc.) before then, please call [phone].  If you test positive for Covid 19 in the 2 weeks post procedure, please call and report this information to [place].    If any biopsies were taken you will be contacted by phone or by letter within the next 1-3 weeks.  Please call us at [phone] if you have not heard about the biopsies in 3 weeks.    SIGNATURES/CONFIDENTIALITY: You and/or your care partner have signed paperwork which will be entered into your electronic medical record.  These signatures attest to the fact that that the information above on your After Visit Summary has been  reviewed and is understood.  Full responsibility of the confidentiality of this discharge information lies with you and/or your care-partner. 

## 2020-11-24 NOTE — Op Note (Signed)
[facility] Patient Name: Kerry Walters Procedure Date: 11/24/2020 7:54 AM MRN: [id number] Endoscopist: Tressia Danas MD, MD Age: 75 Referring MD:  Date of Birth: 1945/09/07 Gender: Female Account #: [id number] Procedure:                Upper GI endoscopy Indications:              Suspected upper gastrointestinal bleeding in                            patient with unexplained iron deficiency anemia                           Hiccups                           History of esophageal stricture without obstructive                            symptoms at this time                           Iron deficiency Medicines:                Monitored Anesthesia Care Procedure:                Pre-Anesthesia Assessment:                           - Prior to the procedure, a History and Physical                            was performed, and patient medications and                            allergies were reviewed. The patient's tolerance of                            previous anesthesia was also reviewed. The risks                            and benefits of the procedure and the sedation                            options and risks were discussed with the patient.                            All questions were answered, and informed consent                            was obtained. Prior Anticoagulants: The patient has                            taken no previous anticoagulant or antiplatelet                            agents. ASA Grade Assessment: III -  A patient with                            severe systemic disease. After reviewing the risks                            and benefits, the patient was deemed in                            satisfactory condition to undergo the procedure.                           After obtaining informed consent, the endoscope was                            passed under direct vision. Throughout the                            procedure, the patient's blood  pressure, pulse, and                            oxygen saturations were monitored continuously. The                            Endoscope was introduced through the mouth, and                            advanced to the third part of duodenum. The upper                            GI endoscopy was accomplished without difficulty.                            The patient tolerated the procedure well. Scope In: Scope Out: Findings:                 One benign-appearing, intrinsic mild                            (non-circumferential scarring) stricture was found                            30 cm from the incisors. The stricture was easily                            traversed. A TTS dilator was passed through the                            scope. Dilation with a 16-17-18 mm balloon dilator                            was performed to 18 mm. The fully inflated balloon  was easily pulled through the esophagus without                            resistance. The dilation site was examined and                            showed no change.                           The examined esophagus mucosa otherwise appeared                            normal. Biopsies were taken from the proximal/mid                            and distal esophagus with a cold forceps for                            histology. Estimated blood loss was minimal.                           There are three prominent veins in the mid                            esophagus that suggest early varices.                           Diffuse mild inflammation was found in the gastric                            body. Biopsies were taken from the antrum, body,                            and fundus with a cold forceps for histology.                            Estimated blood loss was minimal.                           A few localized erosions with no bleeding and no                            stigmata of recent bleeding were found  in the                            gastric antrum. Biopsies were taken with a cold                            forceps for histology. Estimated blood loss was                            minimal.  Multiple small and medium sessile polyps with                            stigmata of recent bleeding were found in the                            gastric fundus and in the gastric body. Biopsies                            were taken with a cold forceps for histology.                            Estimated blood loss was minimal.                           There is a small hiatal hernia. The examined                            duodenum was normal. Complications:            No immediate complications. Estimated blood loss:                            Minimal. Estimated Blood Loss:     Estimated blood loss was minimal. Impression:               - Benign-appearing esophageal stricture. Dilated.                           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                           - Normal esophagus. Biopsied.                           - Gastritis and gastric erosions. Biopsied.                           - Multiple gastric polyps. These may be                            contributing to the anemia. Biopsied.                           - Normal examined duodenum. Recommendation:           - Patient has a contact number available for                            emergencies. The signs and symptoms of potential                            delayed complications were discussed with the  patient. Return to normal activities tomorrow.                            Written discharge instructions were provided to the                            patient.                           - Resume previous diet.                           - Continue present medications.                           - Avoid all NSAIDs.                            - Await pathology results.                           - Office follow-up with Dr. Orvan Falconer to review these                            results and arrange for abdominal ultrasound. Tressia Danas MD, MD 11/24/2020 8:35:56 AM This report has been signed electronically.

## 2020-11-26 ENCOUNTER — Telehealth: Payer: Self-pay

## 2020-11-26 ENCOUNTER — Telehealth: Payer: Self-pay | Admitting: *Deleted

## 2020-11-26 DIAGNOSIS — M5136 Other intervertebral disc degeneration, lumbar region: Secondary | ICD-10-CM | POA: Diagnosis not present

## 2020-11-26 DIAGNOSIS — M9905 Segmental and somatic dysfunction of pelvic region: Secondary | ICD-10-CM | POA: Diagnosis not present

## 2020-11-26 DIAGNOSIS — M9904 Segmental and somatic dysfunction of sacral region: Secondary | ICD-10-CM | POA: Diagnosis not present

## 2020-11-26 DIAGNOSIS — M9903 Segmental and somatic dysfunction of lumbar region: Secondary | ICD-10-CM | POA: Diagnosis not present

## 2020-11-26 NOTE — Telephone Encounter (Signed)
States she felt chilled for a few hours fter the procedure, could not seem to get warm. Today she feels much better.

## 2020-11-26 NOTE — Telephone Encounter (Signed)
NO ANSWER, MESSAGE LEFT FOR PATIENT. 

## 2020-11-26 NOTE — Telephone Encounter (Signed)
Left message on f/u call 

## 2020-12-07 DIAGNOSIS — M9904 Segmental and somatic dysfunction of sacral region: Secondary | ICD-10-CM | POA: Diagnosis not present

## 2020-12-07 DIAGNOSIS — M9905 Segmental and somatic dysfunction of pelvic region: Secondary | ICD-10-CM | POA: Diagnosis not present

## 2020-12-07 DIAGNOSIS — M5136 Other intervertebral disc degeneration, lumbar region: Secondary | ICD-10-CM | POA: Diagnosis not present

## 2020-12-07 DIAGNOSIS — M9903 Segmental and somatic dysfunction of lumbar region: Secondary | ICD-10-CM | POA: Diagnosis not present

## 2020-12-14 DIAGNOSIS — M9904 Segmental and somatic dysfunction of sacral region: Secondary | ICD-10-CM | POA: Diagnosis not present

## 2020-12-14 DIAGNOSIS — M5136 Other intervertebral disc degeneration, lumbar region: Secondary | ICD-10-CM | POA: Diagnosis not present

## 2020-12-14 DIAGNOSIS — M9903 Segmental and somatic dysfunction of lumbar region: Secondary | ICD-10-CM | POA: Diagnosis not present

## 2020-12-14 DIAGNOSIS — M9905 Segmental and somatic dysfunction of pelvic region: Secondary | ICD-10-CM | POA: Diagnosis not present

## 2020-12-15 DIAGNOSIS — M5136 Other intervertebral disc degeneration, lumbar region: Secondary | ICD-10-CM | POA: Diagnosis not present

## 2020-12-15 DIAGNOSIS — M9904 Segmental and somatic dysfunction of sacral region: Secondary | ICD-10-CM | POA: Diagnosis not present

## 2020-12-15 DIAGNOSIS — M9905 Segmental and somatic dysfunction of pelvic region: Secondary | ICD-10-CM | POA: Diagnosis not present

## 2020-12-15 DIAGNOSIS — M9903 Segmental and somatic dysfunction of lumbar region: Secondary | ICD-10-CM | POA: Diagnosis not present

## 2020-12-16 ENCOUNTER — Encounter: Payer: Self-pay | Admitting: Gastroenterology

## 2020-12-16 DIAGNOSIS — M5136 Other intervertebral disc degeneration, lumbar region: Secondary | ICD-10-CM | POA: Diagnosis not present

## 2020-12-16 DIAGNOSIS — M9905 Segmental and somatic dysfunction of pelvic region: Secondary | ICD-10-CM | POA: Diagnosis not present

## 2020-12-16 DIAGNOSIS — M9904 Segmental and somatic dysfunction of sacral region: Secondary | ICD-10-CM | POA: Diagnosis not present

## 2020-12-16 DIAGNOSIS — M9903 Segmental and somatic dysfunction of lumbar region: Secondary | ICD-10-CM | POA: Diagnosis not present

## 2020-12-21 DIAGNOSIS — M5136 Other intervertebral disc degeneration, lumbar region: Secondary | ICD-10-CM | POA: Diagnosis not present

## 2020-12-21 DIAGNOSIS — M9904 Segmental and somatic dysfunction of sacral region: Secondary | ICD-10-CM | POA: Diagnosis not present

## 2020-12-21 DIAGNOSIS — M9903 Segmental and somatic dysfunction of lumbar region: Secondary | ICD-10-CM | POA: Diagnosis not present

## 2020-12-21 DIAGNOSIS — M9905 Segmental and somatic dysfunction of pelvic region: Secondary | ICD-10-CM | POA: Diagnosis not present

## 2020-12-23 DIAGNOSIS — M9903 Segmental and somatic dysfunction of lumbar region: Secondary | ICD-10-CM | POA: Diagnosis not present

## 2020-12-23 DIAGNOSIS — M9904 Segmental and somatic dysfunction of sacral region: Secondary | ICD-10-CM | POA: Diagnosis not present

## 2020-12-23 DIAGNOSIS — M5136 Other intervertebral disc degeneration, lumbar region: Secondary | ICD-10-CM | POA: Diagnosis not present

## 2020-12-23 DIAGNOSIS — M9905 Segmental and somatic dysfunction of pelvic region: Secondary | ICD-10-CM | POA: Diagnosis not present

## 2020-12-28 DIAGNOSIS — M9903 Segmental and somatic dysfunction of lumbar region: Secondary | ICD-10-CM | POA: Diagnosis not present

## 2020-12-28 DIAGNOSIS — M9905 Segmental and somatic dysfunction of pelvic region: Secondary | ICD-10-CM | POA: Diagnosis not present

## 2020-12-28 DIAGNOSIS — M9904 Segmental and somatic dysfunction of sacral region: Secondary | ICD-10-CM | POA: Diagnosis not present

## 2020-12-28 DIAGNOSIS — M5136 Other intervertebral disc degeneration, lumbar region: Secondary | ICD-10-CM | POA: Diagnosis not present

## 2020-12-30 DIAGNOSIS — M9903 Segmental and somatic dysfunction of lumbar region: Secondary | ICD-10-CM | POA: Diagnosis not present

## 2020-12-30 DIAGNOSIS — M9904 Segmental and somatic dysfunction of sacral region: Secondary | ICD-10-CM | POA: Diagnosis not present

## 2020-12-30 DIAGNOSIS — M5136 Other intervertebral disc degeneration, lumbar region: Secondary | ICD-10-CM | POA: Diagnosis not present

## 2020-12-30 DIAGNOSIS — M9905 Segmental and somatic dysfunction of pelvic region: Secondary | ICD-10-CM | POA: Diagnosis not present

## 2021-01-05 ENCOUNTER — Ambulatory Visit: Payer: Medicare Other | Admitting: Gastroenterology

## 2021-01-05 ENCOUNTER — Other Ambulatory Visit (INDEPENDENT_AMBULATORY_CARE_PROVIDER_SITE_OTHER): Payer: Medicare Other

## 2021-01-05 ENCOUNTER — Encounter: Payer: Self-pay | Admitting: Gastroenterology

## 2021-01-05 VITALS — BP 180/60 | HR 72 | Ht 61.25 in | Wt 158.5 lb

## 2021-01-05 DIAGNOSIS — K219 Gastro-esophageal reflux disease without esophagitis: Secondary | ICD-10-CM

## 2021-01-05 DIAGNOSIS — D509 Iron deficiency anemia, unspecified: Secondary | ICD-10-CM

## 2021-01-05 DIAGNOSIS — M9905 Segmental and somatic dysfunction of pelvic region: Secondary | ICD-10-CM | POA: Diagnosis not present

## 2021-01-05 DIAGNOSIS — M9904 Segmental and somatic dysfunction of sacral region: Secondary | ICD-10-CM | POA: Diagnosis not present

## 2021-01-05 DIAGNOSIS — M9903 Segmental and somatic dysfunction of lumbar region: Secondary | ICD-10-CM | POA: Diagnosis not present

## 2021-01-05 DIAGNOSIS — R066 Hiccough: Secondary | ICD-10-CM

## 2021-01-05 DIAGNOSIS — M5136 Other intervertebral disc degeneration, lumbar region: Secondary | ICD-10-CM | POA: Diagnosis not present

## 2021-01-05 LAB — FERRITIN: Ferritin: 15.9 ng/mL (ref 10.0–291.0)

## 2021-01-05 LAB — HEMOGLOBIN: Hemoglobin: 12.2 g/dL (ref 12.0–15.0)

## 2021-01-05 LAB — IRON: Iron: 54 ug/dL (ref 42–145)

## 2021-01-05 MED ORDER — BACLOFEN 10 MG PO TABS
5.0000 mg | ORAL_TABLET | Freq: Three times a day (TID) | ORAL | 0 refills | Status: DC
Start: 2021-01-05 — End: 2021-04-16

## 2021-01-05 NOTE — Progress Notes (Signed)
Referring Provider: Madelin Headings, MD Primary Care Physician:  Madelin Headings, MD  Reason for Consultation:  Hiccups, reflux, and iron deficiency   IMPRESSION:  Daily, intermittent Hiccups not explained by EGD or improved on PPI BID ? Esophageal varices on EGD with normal platelets History of esophageal stricture without obstructive symptoms    - empiric dilation performed 11/24/20 Hiatal hernia Iron deficiency without anemia anemia Diverticulosis without history of diverticulitis  Hiccups: No esophageal and gastric etiologies identified on EGD to explain her hiccups. Continue PPI. Add a trial of low dose baclofen. We reviewed risks that include somnolence, weakness, vertigo. She will start with a night time dose to see how it's tolerated. I've asked her to follow-up with Dr. Fabian Sharp to discuss evaluation for other non-GI causes of hiccups if her symptoms do not improve with Baclofen.    Iron deficiency: Without anemia. No overt bleeding. 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 Discussed possible EGD at the hospital with attempt at resection. She was reluctant to proceed with endoscopy or iron supplements (oral and IV). She agreed to repeat labs today.   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 Repeat EGD at the hospital with attempt at resection recommended. She will consider the procedure and call me back to schedule if she wishes to proceed.   Prominent esophageal veins on EGD: ? Early varices. Platelets normal. Abdominal ultrasound recommended.   History of esophageal stricture: Remains asymptomatic.   Discussed colon cancer screening. She continues to decline colonoscopy, despite iron deficiency.   Diverticulosis: High fiber diet recommended.     PLAN: - Encouraged oral iron supplements QOD - Repeat iron, ferritin - Avoid carbonated beverage, chewing gum - Continue pantoprazole 40 mg QAM - Start baclofen 5 mg TID - Abdominal  ultrasound to evaluate for prominent veins on upper endoscopy - Consider EGD with gastric polypectomies (she will consider and call if she would like to schedule) - Follow-up with Dr. Fabian Sharp to consider non-GI causes of hiccups   I spent 30 minutes, including in depth chart review, independent review of results, communicating results with the patient directly, face-to-face time with the patient, coordinating care, ordering studies and medications as appropriate, and documentation.    HPI: Marleena Shubert is a 75 y.o. female initially referred by Dr. Fabian Sharp for hiccups, reflux, and iron deficiency. She has a history of childhood asthma, diabetes, hypertension, reflux, and kidney stones.   Developed near daily hiccups that occur every time that she eats as well as episodes that occur without eating over the last 9 months. No identified food triggers. Not associated with overeating. Rare carbonated beverages. Rare use of chewing gum. She does not smoke. No heartburn, dysphagia, dysphonia. Has a frequent cough that she can control if her head is elevated.  No concurrent headache or neurologic symptoms. She also wishes to avoid medications.   EGD 11/24/20 showed a benign distal esophageal stricture dilated with a 16 to 18 mm TTS balloon, 3 prominent veins in the mid esophagus suggesting early varices, gastritis, gastric erosions, multiple benign gastric polyps, a small hiatal hernia.  Antral biopsies showed reactive gastropathy and focal intestinal metaplasia.  Fundus and gastric body biopsy showed mild chronic gastritis.  There was no H. pylori.  Gastric polyps were hyperplastic polyps with eroded granulation tissue.  Esophageal biopsies were normal.  She returns today in scheduled follow-up. Daily hiccups persist despite pantoprazole 40 mg BID, although now just using it once daily because she didn't think  it was help. She denies the use of any NSAIDs. She has had no dysphagia before or after her endoscopy.    New diagnosis of iron deficiency without anemia. She does not want to take oral iron supplements and doesn't want to take IV iron. She has a friend who has not "done well" with IV iron.   Prior endoscopic history: - EGD 10/22/2003: Esophageal stricture dilated from 14-21mm savary dilator, small hiatal hernia, and biopsy-proven reflux - Colonoscopy with Dr. Juanda Chance 12/22/2004: diverticulosis in the left colon - Cologuard 08/31/16: negative - She declined further colonoscopy or Cologuard given her age.   Labs 09/07/20: iron 52, ferritin 14, hemoglobin 12.6, MCV 85.6, RDW 15.1, platelets 291  Prior abdominal imaging: - CT abd/pelvis with contrast 09/11/14 for abdominal pain showed diverticulosis, renal calculi with possible UPJ stricture, large hiatal hernia    Past Medical History:  Diagnosis Date  . Allergy   . Asthma due to environmental allergies    no inhaler  . Diabetes mellitus without complication (HCC)   . Diverticulosis of colon   . GERD (gastroesophageal reflux disease)   . History of esophageal dilatation    FOR STRICTURE IN 2005  . History of hiatal hernia   . History of hypercalcemia    SECONDARY TO HYPERPARATHYROID  . History of hyperparathyroidism    PRIMARY--  S/P RIGHT PARATHYROIDECTOMY  . History of kidney stones   . History of recurrent UTIs   . Hyperlipidemia   . Hypertension   . Hypothyroidism   . Incomplete right bundle branch block   . Nephrolithiasis    RIGHT   . Nocturia   . PONV (postoperative nausea and vomiting)   . Pre-diabetes   . Sciatica of right side     Past Surgical History:  Procedure Laterality Date  . APPENDECTOMY  1974  . CYSTOSCOPY WITH URETEROSCOPY AND STENT PLACEMENT Right 11/11/2014   Procedure: CYSTOSCOPY WITH URETEROSCOPY AND STENT PLACEMENT;  Surgeon: Crist Fat, MD;  Location: [facility];  Service: Urology;  Laterality: Right;  . EXCISION URETHRAL CARUNCLE  1981   Open procedure  . KNEE ARTHROSCOPY  Left april 2010  . REMOVAL RIP  1973   First Rib on Right side-- for impingement  . RIGHT INFERIOR PARATHYROIDECTOMY, MINIMALLY INVASIVE  08-14-2008  . TONSILLECTOMY  as child  . TOTAL KNEE ARTHROPLASTY Left 11-17-2009  . TUBAL LIGATION  1973  . VAGINAL HYSTERECTOMY  1995   w/ Bilateral Salpingoophorectomy    Current Outpatient Medications  Medication Sig Dispense Refill  . atorvastatin (LIPITOR) 20 MG tablet TAKE 1 TABLET BY MOUTH IN  THE EVENING 90 tablet 1  . cholecalciferol (VITAMIN D3) 25 MCG (1000 UNIT) tablet Take 1,000 Units by mouth daily.    . Coenzyme Q10 (COQ-10) 400 MG CAPS Take 1 capsule by mouth daily.    [name] conjugated estrogens (PREMARIN) vaginal cream Place 1 Applicatorful vaginally as directed. Use 2-3 times weekly  prn 42.5 g 2  . gabapentin (NEURONTIN) 100 MG capsule TAKE 1 TO 3 CAPSULES BY  MOUTH AT BEDTIME AS  DIRECTED 270 capsule 1  . levothyroxine (SYNTHROID) 75 MCG tablet TAKE 1 TABLET BY MOUTH  DAILY 90 tablet 3  . Magnesium 500 MG TABS Take 2 tablets by mouth daily.    . metFORMIN (GLUCOPHAGE-XR) 500 MG 24 hr tablet TAKE 3 TABLETS BY MOUTH  DAILY 270 tablet 1  . pantoprazole (PROTONIX) 40 MG tablet Take 40 mg by mouth daily.    [name]  pyridOXINE (VITAMIN B-6) 100 MG tablet Take 100 mg by mouth daily.    . TURMERIC CURCUMIN PO Take 1 tablet by mouth daily.    . valsartan-hydrochlorothiazide (DIOVAN-HCT) 160-25 MG tablet TAKE 1 TABLET BY MOUTH  DAILY 90 tablet 2   Current Facility-Administered Medications  Medication Dose Route Frequency Provider Last Rate Last Admin  . betamethasone acetate-betamethasone sodium phosphate (CELESTONE) injection 3 mg  3 mg Intramuscular Once Gala Lewandowsky M, DPM      . betamethasone acetate-betamethasone sodium phosphate (CELESTONE) injection 3 mg  3 mg Intramuscular Once Felecia Shelling, DPM        Allergies as of 01/05/2021 - Review Complete 01/05/2021  Allergen Reaction Noted  . Contrast media [iodinated diagnostic agents]  Shortness Of Breath 09/10/2014  . Codeine Nausea Only 11/06/2014  . Shrimp [shellfish allergy] Swelling 11/07/2014  . Topamax [topiramate] Rash 03/04/2019    Family History  Problem Relation Age of Onset  . Cancer Father        brain  . Cancer Son        liver  . Diabetes Sister   . Schizophrenia Son   . Colon cancer Neg Hx   . Colon polyps Neg Hx   . Esophageal cancer Neg Hx   . Stomach cancer Neg Hx   . Pancreatic cancer Neg Hx   . Rectal cancer Neg Hx      Physical Exam: General:   Alert,  well-nourished, pleasant and cooperative in NAD Head:  Normocephalic and atraumatic. Eyes:  Sclera clear, no icterus.   Conjunctiva pink. Abdomen:  Soft, nontender, nondistended, normal bowel sounds, no rebound or guarding. No hepatosplenomegaly.   Neurologic:  Alert and  oriented x4;  grossly nonfocal Skin:  Intact without significant lesions or rashes. Psych:  Alert and cooperative. Normal mood and affect.     L. Orvan Falconer, MD, MPH 01/05/2021, 3:13 PM

## 2021-01-05 NOTE — Patient Instructions (Addendum)
It was a pleasure to see you today. Based on our discussion, I am providing you with my recommendations below:  RECOMMENDATION(S):   Please consider iron supplements every other day  We will plan to repeat your labs today.  LABS:   . Please proceed to the basement level for lab work before leaving today. Press "B" on the elevator. The lab is located at the first door on the left as you exit the elevator.  HEALTHCARE LAWS AND MY CHART RESULTS:   . Due to recent changes in healthcare laws, you may see results of your imaging and/or laboratory studies on MyChart before I have had a chance to review them.  I understand that in some cases there may be results that are confusing or concerning to you. Please understand that not all results are received at same time and often I may need to interpret multiple results in order to provide you with the best plan of care or course of treatment. Therefore, I ask that you please give me 48 hours to thoroughly review all your results before contacting my office for clarification.   Please continue Pantoprazole as prescribed  I will send a prescription to your pharmacy to begin Baclofen 3 times daily  PRESCRIPTION MEDICATION(S):   We have sent the following medication(s) to your pharmacy:  . Baclofen  NOTE: If your medication(s) requires a PRIOR AUTHORIZATION, we will receive notification from your pharmacy. Once received, the process to submit for approval may take up to 7-10 business days. You will be contacted about any denials we have received from your insurance company as well as alternatives recommended by your provider.  I would like to better evaluate your symptoms with an abdominal ultrasound  IMAGING:  . You will be contacted by [facility] Scheduling (Your caller ID will indicate phone # [phone]) within the next business 7-10 business days to schedule your ABDOMINAL ULTRASOUND. If you have not heard from them within 7-10  business days, please call [facility] Scheduling at [phone] to follow up on the status of your appointment.    Please follow up with Dr. Fabian Sharp to consider other alternative treatments for your hiccups  FOLLOW UP:  I would like for you to follow up with me as needed. Please call the office at [phone] to schedule your appointment.  BMI:  . If you are age 33 or older, your body mass index should be between 23-30. Your Body mass index is 29.7 kg/m. If this is out of the aforementioned range listed, please consider follow up with your Primary Care Provider.  Thank you for trusting me with your gastrointestinal care!    Tressia Danas, MD, MPH

## 2021-01-05 NOTE — Progress Notes (Signed)
Following message sent:  RADIOLOGY Kerry Aye  [facility] Phone: [phone] Fax: [phone]   Imaging Ordered: [place] Abd  Diagnosis: Chronic hiccups, Iron def anemia, GERD  Ordering Provider: Dr. Orvan Falconer  Is a Prior Authorization needed? We are in the process of obtaining it now  Is the patient Diabetic? Yes  Does the patient have Hypertension? Yes  Does the patient have any implanted devices or hardware? No  Date of last BUN/Creat, if needed? N/A  Patient Weight? 158#  Is the patient able to get on the table? Yes  Has the patient been diagnosed with COVID? No  Is the patient waiting on COVID testing results? No  Thank you for your assistance! [facility]

## 2021-01-07 DIAGNOSIS — M5136 Other intervertebral disc degeneration, lumbar region: Secondary | ICD-10-CM | POA: Diagnosis not present

## 2021-01-07 DIAGNOSIS — M9905 Segmental and somatic dysfunction of pelvic region: Secondary | ICD-10-CM | POA: Diagnosis not present

## 2021-01-07 DIAGNOSIS — M9903 Segmental and somatic dysfunction of lumbar region: Secondary | ICD-10-CM | POA: Diagnosis not present

## 2021-01-07 DIAGNOSIS — M9904 Segmental and somatic dysfunction of sacral region: Secondary | ICD-10-CM | POA: Diagnosis not present

## 2021-01-09 ENCOUNTER — Other Ambulatory Visit: Payer: Self-pay | Admitting: Internal Medicine

## 2021-01-12 DIAGNOSIS — M9904 Segmental and somatic dysfunction of sacral region: Secondary | ICD-10-CM | POA: Diagnosis not present

## 2021-01-12 DIAGNOSIS — M5136 Other intervertebral disc degeneration, lumbar region: Secondary | ICD-10-CM | POA: Diagnosis not present

## 2021-01-12 DIAGNOSIS — M9903 Segmental and somatic dysfunction of lumbar region: Secondary | ICD-10-CM | POA: Diagnosis not present

## 2021-01-12 DIAGNOSIS — M9905 Segmental and somatic dysfunction of pelvic region: Secondary | ICD-10-CM | POA: Diagnosis not present

## 2021-01-13 DIAGNOSIS — H47092 Other disorders of optic nerve, not elsewhere classified, left eye: Secondary | ICD-10-CM | POA: Diagnosis not present

## 2021-01-13 DIAGNOSIS — H401131 Primary open-angle glaucoma, bilateral, mild stage: Secondary | ICD-10-CM | POA: Diagnosis not present

## 2021-01-13 LAB — HM DIABETES EYE EXAM

## 2021-01-14 ENCOUNTER — Ambulatory Visit (HOSPITAL_COMMUNITY)
Admission: RE | Admit: 2021-01-14 | Discharge: 2021-01-14 | Disposition: A | Discharge status: Home / Self Care | Payer: Medicare Other | Source: Ambulatory Visit | Attending: Gastroenterology | Admitting: Gastroenterology

## 2021-01-14 DIAGNOSIS — D509 Iron deficiency anemia, unspecified: Secondary | ICD-10-CM | POA: Insufficient documentation

## 2021-01-14 DIAGNOSIS — R066 Hiccough: Secondary | ICD-10-CM | POA: Insufficient documentation

## 2021-01-14 DIAGNOSIS — K219 Gastro-esophageal reflux disease without esophagitis: Secondary | ICD-10-CM | POA: Diagnosis not present

## 2021-01-14 DIAGNOSIS — N281 Cyst of kidney, acquired: Secondary | ICD-10-CM | POA: Diagnosis not present

## 2021-01-19 ENCOUNTER — Encounter: Payer: Self-pay | Admitting: Internal Medicine

## 2021-01-19 DIAGNOSIS — M9903 Segmental and somatic dysfunction of lumbar region: Secondary | ICD-10-CM | POA: Diagnosis not present

## 2021-01-19 DIAGNOSIS — M9905 Segmental and somatic dysfunction of pelvic region: Secondary | ICD-10-CM | POA: Diagnosis not present

## 2021-01-19 DIAGNOSIS — M5136 Other intervertebral disc degeneration, lumbar region: Secondary | ICD-10-CM | POA: Diagnosis not present

## 2021-01-19 DIAGNOSIS — M9904 Segmental and somatic dysfunction of sacral region: Secondary | ICD-10-CM | POA: Diagnosis not present

## 2021-01-27 DIAGNOSIS — M9905 Segmental and somatic dysfunction of pelvic region: Secondary | ICD-10-CM | POA: Diagnosis not present

## 2021-01-27 DIAGNOSIS — M9904 Segmental and somatic dysfunction of sacral region: Secondary | ICD-10-CM | POA: Diagnosis not present

## 2021-01-27 DIAGNOSIS — M5136 Other intervertebral disc degeneration, lumbar region: Secondary | ICD-10-CM | POA: Diagnosis not present

## 2021-01-27 DIAGNOSIS — M9903 Segmental and somatic dysfunction of lumbar region: Secondary | ICD-10-CM | POA: Diagnosis not present

## 2021-02-01 ENCOUNTER — Telehealth: Payer: Self-pay | Admitting: Internal Medicine

## 2021-02-01 NOTE — Telephone Encounter (Signed)
Pt called back and do not want to do the [facility] at this present time.

## 2021-02-01 NOTE — Telephone Encounter (Signed)
Left message for patient to call back and schedule Medicare Annual Wellness Visit (AWV) either virtually or in office.   Last AWV 08/23/16  please schedule at anytime with [facility] Nurse Health Advisor 1 or 2   This should be a 45 minute visit.

## 2021-02-01 NOTE — Telephone Encounter (Signed)
Noted  

## 2021-02-02 DIAGNOSIS — M5136 Other intervertebral disc degeneration, lumbar region: Secondary | ICD-10-CM | POA: Diagnosis not present

## 2021-02-02 DIAGNOSIS — M9905 Segmental and somatic dysfunction of pelvic region: Secondary | ICD-10-CM | POA: Diagnosis not present

## 2021-02-02 DIAGNOSIS — M9903 Segmental and somatic dysfunction of lumbar region: Secondary | ICD-10-CM | POA: Diagnosis not present

## 2021-02-02 DIAGNOSIS — M9904 Segmental and somatic dysfunction of sacral region: Secondary | ICD-10-CM | POA: Diagnosis not present

## 2021-02-09 DIAGNOSIS — M9904 Segmental and somatic dysfunction of sacral region: Secondary | ICD-10-CM | POA: Diagnosis not present

## 2021-02-09 DIAGNOSIS — M9903 Segmental and somatic dysfunction of lumbar region: Secondary | ICD-10-CM | POA: Diagnosis not present

## 2021-02-09 DIAGNOSIS — M9905 Segmental and somatic dysfunction of pelvic region: Secondary | ICD-10-CM | POA: Diagnosis not present

## 2021-02-09 DIAGNOSIS — M5136 Other intervertebral disc degeneration, lumbar region: Secondary | ICD-10-CM | POA: Diagnosis not present

## 2021-02-15 DIAGNOSIS — M9903 Segmental and somatic dysfunction of lumbar region: Secondary | ICD-10-CM | POA: Diagnosis not present

## 2021-02-15 DIAGNOSIS — M5136 Other intervertebral disc degeneration, lumbar region: Secondary | ICD-10-CM | POA: Diagnosis not present

## 2021-02-15 DIAGNOSIS — M9904 Segmental and somatic dysfunction of sacral region: Secondary | ICD-10-CM | POA: Diagnosis not present

## 2021-02-15 DIAGNOSIS — M9905 Segmental and somatic dysfunction of pelvic region: Secondary | ICD-10-CM | POA: Diagnosis not present

## 2021-02-23 DIAGNOSIS — M9903 Segmental and somatic dysfunction of lumbar region: Secondary | ICD-10-CM | POA: Diagnosis not present

## 2021-02-23 DIAGNOSIS — M9905 Segmental and somatic dysfunction of pelvic region: Secondary | ICD-10-CM | POA: Diagnosis not present

## 2021-02-23 DIAGNOSIS — M5136 Other intervertebral disc degeneration, lumbar region: Secondary | ICD-10-CM | POA: Diagnosis not present

## 2021-02-23 DIAGNOSIS — M9904 Segmental and somatic dysfunction of sacral region: Secondary | ICD-10-CM | POA: Diagnosis not present

## 2021-03-08 ENCOUNTER — Other Ambulatory Visit: Payer: Self-pay

## 2021-03-08 NOTE — Progress Notes (Signed)
Chief Complaint  Patient presents with   Annual Exam     HPI: Kerry Walters 75 y.o. comes in today for Preventive Medicare exam/ wellness visit . And med management Since last visit.   Gi   evaluation and follow-up iron deficiency gastric polyp can question varices sees no reason .  For hiccups. Hiccoughs off and on  and tried  medication .     x5 daus and did nt help no help.  She states these are non-intractable but will come in episodes and then go away most days.  Does not keep her from sleeping.  Cannot really associate it with eating per se.  No chest symptoms. She is weaning the dose of PPI.  Does not check her blood pressure at home just does not get to it is not really on the top of her list.  Thinks it is probably fine taking medications Thyroid taking on a regular basis no change.  Has been taking iron supplement for a few weeks wonders if it will help her make less tired Denies sleep apnea symptoms but just has fatigue in the afternoon feels like she has to lay down. Has been on her lipid medicine and I Acunol supplement because it is a statin medicine. Taking 1500 no major changes in hearing vision numbness weakness bleeding.  Mg of metformin a day  Health Maintenance  Topic Date Due   DEXA SCAN  Never done   COVID-19 Vaccine (3 - Moderna risk series) 12/18/2019   FOOT EXAM  03/03/2021   Hepatitis C Screening  09/07/2021 (Originally 02/14/1964)   INFLUENZA VACCINE  03/29/2021   HEMOGLOBIN A1C  09/09/2021   OPHTHALMOLOGY EXAM  01/13/2022   TETANUS/TDAP  11/23/2024   PNA vac Low Risk Adult  Completed   Zoster Vaccines- Shingrix  Completed   HPV VACCINES  Aged Out   Health Maintenance Review LIFESTYLE:  Exercise:  limited back walking dog  around house.  Gardening  Tobacco/ETS: n Alcohol: n Sugar beverages: n Sleep: depending  5-7  Drug use: no HH: 2  1 pet   Hearing: no change  Vision:  No limitations at present . Last eye check UTD se eye doc  Safety:   Has smoke detector and wears seat belts.  No excess sun exposure. Sees dentist regularly. Falls: n Memory: Felt to be good  , no concern from her or her family. Less names  delayed recall  Depression: No anhedonia unusual crying or depressive symptoms Nutrition: Eats well balanced diet; adequate calcium and vitamin D. No swallowing chewing problems. Injury: no major injuries in the last six months. Other healthcare providers:  Reviewed today . Preventive parameters: up-to-date  Reviewed  ADLS:   There are no problems or need for assistance  driving, feeding, obtaining food, dressing, toileting and bathing, managing money using phone. She is independent.    ROS:  See hpi  REST of 12 system review negative except as per HPI   Past Medical History:  Diagnosis Date   Allergy    Asthma due to environmental allergies    no inhaler   Diabetes mellitus without complication (HCC)    Diverticulosis of colon    GERD (gastroesophageal reflux disease)    History of esophageal dilatation    FOR STRICTURE IN 2005   History of hiatal hernia    History of hypercalcemia    SECONDARY TO HYPERPARATHYROID   History of hyperparathyroidism    PRIMARY--  S/P RIGHT PARATHYROIDECTOMY  History of kidney stones    History of recurrent UTIs    Hyperlipidemia    Hypertension    Hypothyroidism    Incomplete right bundle branch block    Nephrolithiasis    RIGHT    Nocturia    PONV (postoperative nausea and vomiting)    Pre-diabetes    Sciatica of right side     Family History  Problem Relation Age of Onset   Cancer Father        brain   Cancer Son        liver   Diabetes Sister    Schizophrenia Son    Colon cancer Neg Hx    Colon polyps Neg Hx    Esophageal cancer Neg Hx    Stomach cancer Neg Hx    Pancreatic cancer Neg Hx    Rectal cancer Neg Hx     Social History   Socioeconomic History   Marital status: Married    Spouse name: Not on file   Number of children: Not on file    Years of education: Not on file   Highest education level: Not on file  Occupational History   Not on file  Tobacco Use   Smoking status: Never   Smokeless tobacco: Never  Vaping Use   Vaping Use: Never used  Substance and Sexual Activity   Alcohol use: No   Drug use: No   Sexual activity: Not on file  Other Topics Concern   Not on file  Social History Narrative   Married   hh of 2   4 dogs   G2 P2   Water exercise.   Retired    [employer]    great grandchild          Social Determinants of Corporate investment bankerHealth   Financial Resource Strain: Low Risk    Difficulty of Paying Living Expenses: Not hard at all  Food Insecurity: Not on file  Transportation Needs: No Transportation Needs   Lack of Transportation (Medical): No   Lack of Transportation (Non-Medical): No  Physical Activity: Not on file  Stress: Not on file  Social Connections: Not on file    Outpatient Encounter Medications as of 03/09/2021  Medication Sig   atorvastatin (LIPITOR) 20 MG tablet TAKE 1 TABLET BY MOUTH IN  THE EVENING   baclofen (LIORESAL) 10 MG tablet Take 0.5 tablets (5 mg total) by mouth 3 (three) times daily.   cholecalciferol (VITAMIN D3) 25 MCG (1000 UNIT) tablet Take 1,000 Units by mouth daily.   Coenzyme Q10 (COQ-10) 400 MG CAPS Take 1 capsule by mouth daily.   conjugated estrogens (PREMARIN) vaginal cream Place 1 Applicatorful vaginally as directed. Use 2-3 times weekly  prn   FERROUS SULFATE PO Take by mouth daily.   gabapentin (NEURONTIN) 100 MG capsule TAKE 1 TO 3 CAPSULES BY  MOUTH AT BEDTIME AS  DIRECTED   levothyroxine (SYNTHROID) 75 MCG tablet TAKE 1 TABLET BY MOUTH  DAILY   Magnesium 500 MG TABS Take 2 tablets by mouth daily.   metFORMIN (GLUCOPHAGE-XR) 500 MG 24 hr tablet TAKE 3 TABLETS BY MOUTH  DAILY   pantoprazole (PROTONIX) 40 MG tablet Take 40 mg by mouth daily.   pyridOXINE (VITAMIN B-6) 100 MG tablet Take 100 mg by mouth daily.   TURMERIC CURCUMIN PO Take 1 tablet by mouth daily.    valsartan-hydrochlorothiazide (DIOVAN-HCT) 160-25 MG tablet TAKE 1 TABLET BY MOUTH  DAILY   Facility-Administered Encounter Medications as of 03/09/2021  Medication  betamethasone acetate-betamethasone sodium phosphate (CELESTONE) injection 3 mg   betamethasone acetate-betamethasone sodium phosphate (CELESTONE) injection 3 mg    EXAM:  BP (!) 154/90 (BP Location: Left Arm, Patient Position: Sitting, Cuff Size: Normal)   Pulse 68   Temp 98.2 F (36.8 C) (Oral)   Ht 5' 1.5" (1.562 m)   Wt 153 lb 3.2 oz (69.5 kg)   SpO2 98%   BMI 28.48 kg/m   Body mass index is 28.48 kg/m. Wt Readings from Last 3 Encounters:  03/09/21 153 lb 3.2 oz (69.5 kg)  01/05/21 158 lb 8 oz (71.9 kg)  11/24/20 158 lb (71.7 kg)    Physical Exam: Vital signs reviewed only had episode of hiccups 1 time during the whole visit lasted for 15 seconds and then gone. ZOX:WRUE is a well-developed well-nourished alert cooperative   who appears stated age in no acute distress.  HEENT: normocephalic atraumatic , Eyes: PERRL EOM's full, conjunctiva clear, Nares: paten,t no deformity discharge or tenderness., Ears: no deformity EAC's clear TMs with normal landmarks. Mouth: masked  NECK: supple without masses, thyromegaly or bruits. CHEST/PULM:  Clear to auscultation and percussion breath sounds equal no wheeze , rales or rhonchi.  CV: PMI is nondisplaced, S1 S2 no gallops, murmurs, rubs. Peripheral pulses are full without delay.No JVD .  ABDOMEN: Bowel sounds normal nontender  No guard or rebound, no hepato splenomegal no CVA tenderness.   Extremtities:  No clubbing cyanosis or edema, no acute joint swelling or redness no focal atrophy NEURO:  Oriented x3, cranial nerves 3-12 appear to be intact, no obvious focal weakness,gait within normal limits no abnormal reflexes or asymmetrical SKIN: No acute rashes normal turgor, color, no bruising or petechiae. PSYCH: Oriented, good eye contact, no obvious depression anxiety,  cognition and judgment appear normal. LN: no cervical axillary inguinal adenopathy No noted deficits in memory, attention, and speech. Diabetic Foot Exam - Simple   Simple Foot Form Visual Inspection No deformities, no ulcerations, no other skin breakdown bilaterally: Yes Sensation Testing Intact to touch and monofilament testing bilaterally: Yes Pulse Check Posterior Tibialis and Dorsalis pulse intact bilaterally: Yes Comments      Lab Results  Component Value Date   WBC 8.3 03/09/2021   HGB 12.7 03/09/2021   HCT 37.6 03/09/2021   PLT 297.0 03/09/2021   GLUCOSE 122 (H) 03/09/2021   CHOL 138 03/09/2021   TRIG 92.0 03/09/2021   HDL 45.90 03/09/2021   LDLDIRECT 101.0 03/04/2019   LDLCALC 73 03/09/2021   ALT 21 03/09/2021   AST 23 03/09/2021   NA 143 03/09/2021   K 4.0 03/09/2021   CL 102 03/09/2021   CREATININE 1.11 03/09/2021   BUN 27 (H) 03/09/2021   CO2 32 03/09/2021   TSH 3.19 03/09/2021   INR 1.12 11/19/2009   HGBA1C 6.7 (H) 03/09/2021   MICROALBUR 34.8 (H) 03/09/2021    ASSESSMENT AND PLAN:  Discussed the following assessment and plan:  Visit for preventive health examination  Medication management - Plan: Basic metabolic panel, CBC with Differential/Platelet, Hemoglobin A1c, Hepatic function panel, Lipid panel, TSH, Microalbumin / creatinine urine ratio, Magnesium, T4, free, T4, free, Magnesium, Microalbumin / creatinine urine ratio, TSH, Lipid panel, Hepatic function panel, Hemoglobin A1c, CBC with Differential/Platelet, Basic metabolic panel  Essential hypertension - Plan: Basic metabolic panel, CBC with Differential/Platelet, Hemoglobin A1c, Hepatic function panel, Lipid panel, TSH, Microalbumin / creatinine urine ratio, Magnesium, Magnesium, Microalbumin / creatinine urine ratio, TSH, Lipid panel, Hepatic function panel, Hemoglobin A1c, CBC with Differential/Platelet,  Basic metabolic panel  Type 2 diabetes mellitus with hyperglycemia, without long-term  current use of insulin (HCC) - Plan: Basic metabolic panel, CBC with Differential/Platelet, Hemoglobin A1c, Hepatic function panel, Lipid panel, TSH, Microalbumin / creatinine urine ratio, Magnesium, T4, free, T4, free, Magnesium, Microalbumin / creatinine urine ratio, TSH, Lipid panel, Hepatic function panel, Hemoglobin A1c, CBC with Differential/Platelet, Basic metabolic panel  Hypothyroidism, unspecified type - Plan: Basic metabolic panel, CBC with Differential/Platelet, Hemoglobin A1c, Hepatic function panel, Lipid panel, TSH, Microalbumin / creatinine urine ratio, Magnesium, T4, free, T4, free, Magnesium, Microalbumin / creatinine urine ratio, TSH, Lipid panel, Hepatic function panel, Hemoglobin A1c, CBC with Differential/Platelet, Basic metabolic panel  Post-COVID chronic fatigue - Plan: Basic metabolic panel, CBC with Differential/Platelet, Hemoglobin A1c, Hepatic function panel, Lipid panel, TSH, Microalbumin / creatinine urine ratio, Magnesium, T4, free, T4, free, Magnesium, Microalbumin / creatinine urine ratio, TSH, Lipid panel, Hepatic function panel, Hemoglobin A1c, CBC with Differential/Platelet, Basic metabolic panel  Hyperlipidemia, unspecified hyperlipidemia type - Plan: Basic metabolic panel, CBC with Differential/Platelet, Hemoglobin A1c, Hepatic function panel, Lipid panel, TSH, Microalbumin / creatinine urine ratio, Magnesium, T4, free, T4, free, Magnesium, Microalbumin / creatinine urine ratio, TSH, Lipid panel, Hepatic function panel, Hemoglobin A1c, CBC with Differential/Platelet, Basic metabolic panel  Hiccoughs - Plan: Basic metabolic panel, CBC with Differential/Platelet, Hemoglobin A1c, Hepatic function panel, Lipid panel, TSH, Microalbumin / creatinine urine ratio, Magnesium, T4, free, T4, free, Magnesium, Microalbumin / creatinine urine ratio, TSH, Lipid panel, Hepatic function panel, Hemoglobin A1c, CBC with Differential/Platelet, Basic metabolic panel  Low magnesium level -  Plan: Basic metabolic panel, CBC with Differential/Platelet, Hemoglobin A1c, Hepatic function panel, Lipid panel, TSH, Microalbumin / creatinine urine ratio, Magnesium, T4, free, T4, free, Magnesium, Microalbumin / creatinine urine ratio, TSH, Lipid panel, Hepatic function panel, Hemoglobin A1c, CBC with Differential/Platelet, Basic metabolic panel  Iron deficiency anemia, unspecified iron deficiency anemia type - Plan: Basic metabolic panel, CBC with Differential/Platelet, Hemoglobin A1c, Hepatic function panel, Lipid panel, TSH, Microalbumin / creatinine urine ratio, Magnesium, T4, free, T4, free, Magnesium, Microalbumin / creatinine urine ratio, TSH, Lipid panel, Hepatic function panel, Hemoglobin A1c, CBC with Differential/Platelet, Basic metabolic panel Ongoing fatigue intermittent but not intractable hiccups and no nocturnal symptoms no obvious cause otherwise.  Plan record review decision about further action and evaluation. She is somewhat hesitant does not know if she will follow through about blood pressure readings at home but encouraged her to give [place] 3 to 5 days of readings. Reviewed blood pressure goals with her she is reluctant to add more medicine. Patient Care Team: , Neta Mends, MD as PCP - General Cleophas Dunker Claude Manges, MD (Orthopedic Surgery) Cherlyn Roberts, MD (Dermatology) Ernesto Rutherford, MD (Ophthalmology) Su Grand, MD (Inactive) as Consulting Physician (Urology) Dr Leana Roe chiropractor  Reinaldo Raddle, Encarnacion Slates, [facility] as Pharmacist (Pharmacist)  Patient Instructions   Laboratory follow-up today. We will review your record and consider other evaluation work-up for hiccups.  Please check blood pressure readings twice a day 2 times in each sitting for at least 3 days in a row and send in readings to [place]. Otherwise her blood pressure reading is too high.  And consideration of more blood pressure medicine. Blood pressure goal is average of 130/80 or thereabouts. Losing 3 to 5  pounds in a healthy manner may also be helpful for blood pressure control.  Plan follow-up depending on lab results or 4 to 6 months.  Health Maintenance, Female Adopting a healthy lifestyle and getting preventive care are important in  promoting health and wellness. Ask your health care provider about: The right schedule for you to have regular tests and exams. Things you can do on your own to prevent diseases and keep yourself healthy. What should I know about diet, weight, and exercise? Eat a healthy diet  Eat a diet that includes plenty of vegetables, fruits, low-fat dairy products, and lean protein. Do not eat a lot of foods that are high in solid fats, added sugars, or sodium.  Maintain a healthy weight Body mass index (BMI) is used to identify weight problems. It estimates body fat based on height and weight. Your health care provider can help determineyour BMI and help you achieve or maintain a healthy weight. Get regular exercise Get regular exercise. This is one of the most important things you can do for your health. Most adults should: Exercise for at least 150 minutes each week. The exercise should increase your heart rate and make you sweat (moderate-intensity exercise). Do strengthening exercises at least twice a week. This is in addition to the moderate-intensity exercise. Spend less time sitting. Even light physical activity can be beneficial. Watch cholesterol and blood lipids Have your blood tested for lipids and cholesterol at 77 years of age, then havethis test every 5 years. Have your cholesterol levels checked more often if: Your lipid or cholesterol levels are high. You are older than 77 years of age. You are at high risk for heart disease. What should I know about cancer screening? Depending on your health history and family history, you may need to have cancer screening at various ages. This may include screening for: Breast cancer. Cervical cancer. Colorectal  cancer. Skin cancer. Lung cancer. What should I know about heart disease, diabetes, and high blood pressure? Blood pressure and heart disease High blood pressure causes heart disease and increases the risk of stroke. This is more likely to develop in people who have high blood pressure readings, are of African descent, or are overweight. Have your blood pressure checked: Every 3-5 years if you are 74-47 years of age. Every year if you are 6 years old or older. Diabetes Have regular diabetes screenings. This checks your fasting blood sugar level. Have the screening done: Once every three years after age 27 if you are at a normal weight and have a low risk for diabetes. More often and at a younger age if you are overweight or have a high risk for diabetes. What should I know about preventing infection? Hepatitis B If you have a higher risk for hepatitis B, you should be screened for this virus. Talk with your health care provider to find out if you are at risk forhepatitis B infection. Hepatitis C Testing is recommended for: Everyone born from [birth year] through 1967. Anyone with known risk factors for hepatitis C. Sexually transmitted infections (STIs) Get screened for STIs, including gonorrhea and chlamydia, if: You are sexually active and are younger than 77 years of age. You are older than 77 years of age and your health care provider tells you that you are at risk for this type of infection. Your sexual activity has changed since you were last screened, and you are at increased risk for chlamydia or gonorrhea. Ask your health care provider if you are at risk. Ask your health care provider about whether you are at high risk for HIV. Your health care provider may recommend a prescription medicine to help prevent HIV infection. If you choose to take medicine to prevent HIV, you should  first get tested for HIV. You should then be tested every 3 months for as long as you are taking the  medicine. Pregnancy If you are about to stop having your period (premenopausal) and you may become pregnant, seek counseling before you get pregnant. Take 400 to 800 micrograms (mcg) of folic acid every day if you become pregnant. Ask for birth control (contraception) if you want to prevent pregnancy. Osteoporosis and menopause Osteoporosis is a disease in which the bones lose minerals and strength with aging. This can result in bone fractures. If you are 60 years old or older, or if you are at risk for osteoporosis and fractures, ask your health care provider if you should: Be screened for bone loss. Take a calcium or vitamin D supplement to lower your risk of fractures. Be given hormone replacement therapy (HRT) to treat symptoms of menopause. Follow these instructions at home: Lifestyle Do not use any products that contain nicotine or tobacco, such as cigarettes, e-cigarettes, and chewing tobacco. If you need help quitting, ask your health care provider. Do not use street drugs. Do not share needles. Ask your health care provider for help if you need support or information about quitting drugs. Alcohol use Do not drink alcohol if: Your health care provider tells you not to drink. You are pregnant, may be pregnant, or are planning to become pregnant. If you drink alcohol: Limit how much you use to 0-1 drink a day. Limit intake if you are breastfeeding. Be aware of how much alcohol is in your drink. In the [place] one drink equals one 12 oz bottle of beer (355 mL), one 5 oz glass of wine (148 mL), or one 1 oz glass of hard liquor (44 mL). General instructions Schedule regular health, dental, and eye exams. Stay current with your vaccines. Tell your health care provider if: You often feel depressed. You have ever been abused or do not feel safe at home. Summary Adopting a healthy lifestyle and getting preventive care are important in promoting health and wellness. Follow your health  care provider's instructions about healthy diet, exercising, and getting tested or screened for diseases. Follow your health care provider's instructions on monitoring your cholesterol and blood pressure. This information is not intended to replace advice given to you by your health care provider. Make sure you discuss any questions you have with your healthcare provider. Document Revised: 08/08/2018 Document Reviewed: 08/08/2018 Elsevier Patient Education  2022 [employer].    [place].  M.D.

## 2021-03-09 ENCOUNTER — Ambulatory Visit (INDEPENDENT_AMBULATORY_CARE_PROVIDER_SITE_OTHER): Payer: Medicare Other | Admitting: Internal Medicine

## 2021-03-09 ENCOUNTER — Encounter: Payer: Self-pay | Admitting: Internal Medicine

## 2021-03-09 VITALS — BP 154/90 | HR 68 | Temp 98.2°F | Ht 61.5 in | Wt 153.2 lb

## 2021-03-09 DIAGNOSIS — E039 Hypothyroidism, unspecified: Secondary | ICD-10-CM | POA: Diagnosis not present

## 2021-03-09 DIAGNOSIS — Z79899 Other long term (current) drug therapy: Secondary | ICD-10-CM

## 2021-03-09 DIAGNOSIS — E1165 Type 2 diabetes mellitus with hyperglycemia: Secondary | ICD-10-CM

## 2021-03-09 DIAGNOSIS — Z0000 Encounter for general adult medical examination without abnormal findings: Secondary | ICD-10-CM

## 2021-03-09 DIAGNOSIS — R066 Hiccough: Secondary | ICD-10-CM | POA: Diagnosis not present

## 2021-03-09 DIAGNOSIS — I10 Essential (primary) hypertension: Secondary | ICD-10-CM

## 2021-03-09 DIAGNOSIS — E785 Hyperlipidemia, unspecified: Secondary | ICD-10-CM

## 2021-03-09 DIAGNOSIS — D509 Iron deficiency anemia, unspecified: Secondary | ICD-10-CM | POA: Diagnosis not present

## 2021-03-09 DIAGNOSIS — U099 Post covid-19 condition, unspecified: Secondary | ICD-10-CM

## 2021-03-09 DIAGNOSIS — R790 Abnormal level of blood mineral: Secondary | ICD-10-CM | POA: Diagnosis not present

## 2021-03-09 DIAGNOSIS — R5382 Chronic fatigue, unspecified: Secondary | ICD-10-CM

## 2021-03-09 DIAGNOSIS — G9332 Myalgic encephalomyelitis/chronic fatigue syndrome: Secondary | ICD-10-CM

## 2021-03-09 LAB — CBC WITH DIFFERENTIAL/PLATELET
Basophils Absolute: 0 10*3/uL (ref 0.0–0.1)
Basophils Relative: 0.6 % (ref 0.0–3.0)
Eosinophils Absolute: 0.2 10*3/uL (ref 0.0–0.7)
Eosinophils Relative: 2.4 % (ref 0.0–5.0)
HCT: 37.6 % (ref 36.0–46.0)
Hemoglobin: 12.7 g/dL (ref 12.0–15.0)
Lymphocytes Relative: 29.2 % (ref 12.0–46.0)
Lymphs Abs: 2.4 10*3/uL (ref 0.7–4.0)
MCHC: 33.8 g/dL (ref 30.0–36.0)
MCV: 86.5 fl (ref 78.0–100.0)
Monocytes Absolute: 0.5 10*3/uL (ref 0.1–1.0)
Monocytes Relative: 6.1 % (ref 3.0–12.0)
Neutro Abs: 5.1 10*3/uL (ref 1.4–7.7)
Neutrophils Relative %: 61.7 % (ref 43.0–77.0)
Platelets: 297 10*3/uL (ref 150.0–400.0)
RBC: 4.34 Mil/uL (ref 3.87–5.11)
RDW: 14.4 % (ref 11.5–15.5)
WBC: 8.3 10*3/uL (ref 4.0–10.5)

## 2021-03-09 LAB — HEPATIC FUNCTION PANEL
ALT: 21 U/L (ref 0–35)
AST: 23 U/L (ref 0–37)
Albumin: 4.3 g/dL (ref 3.5–5.2)
Alkaline Phosphatase: 67 U/L (ref 39–117)
Bilirubin, Direct: 0.1 mg/dL (ref 0.0–0.3)
Total Bilirubin: 0.4 mg/dL (ref 0.2–1.2)
Total Protein: 6.8 g/dL (ref 6.0–8.3)

## 2021-03-09 LAB — LIPID PANEL
Cholesterol: 138 mg/dL (ref 0–200)
HDL: 45.9 mg/dL (ref >39.00)
LDL Cholesterol: 73 mg/dL (ref 0–99)
NonHDL: 91.67
Total CHOL/HDL Ratio: 3
Triglycerides: 92 mg/dL (ref 0.0–149.0)
VLDL: 18.4 mg/dL (ref 0.0–40.0)

## 2021-03-09 LAB — BASIC METABOLIC PANEL
BUN: 27 mg/dL — ABNORMAL HIGH (ref 6–23)
CO2: 32 mEq/L (ref 19–32)
Calcium: 10.8 mg/dL — ABNORMAL HIGH (ref 8.4–10.5)
Chloride: 102 mEq/L (ref 96–112)
Creatinine, Ser: 1.11 mg/dL (ref 0.40–1.20)
GFR: 48.09 mL/min — ABNORMAL LOW (ref >60.00)
Glucose, Bld: 122 mg/dL — ABNORMAL HIGH (ref 70–99)
Potassium: 4 mEq/L (ref 3.5–5.1)
Sodium: 143 mEq/L (ref 135–145)

## 2021-03-09 LAB — MICROALBUMIN / CREATININE URINE RATIO
Creatinine,U: 89.1 mg/dL
Microalb Creat Ratio: 39 mg/g — ABNORMAL HIGH (ref 0.0–30.0)
Microalb, Ur: 34.8 mg/dL — ABNORMAL HIGH (ref 0.0–1.9)

## 2021-03-09 LAB — MAGNESIUM: Magnesium: 1.9 mg/dL (ref 1.5–2.5)

## 2021-03-09 LAB — TSH: TSH: 3.19 u[IU]/mL (ref 0.35–5.50)

## 2021-03-09 LAB — T4, FREE: Free T4: 1.05 ng/dL (ref 0.60–1.60)

## 2021-03-09 LAB — HEMOGLOBIN A1C: Hgb A1c MFr Bld: 6.7 % — ABNORMAL HIGH (ref 4.6–6.5)

## 2021-03-09 NOTE — Patient Instructions (Signed)
Laboratory follow-up today. We will review your record and consider other evaluation work-up for hiccups.  Please check blood pressure readings twice a day 2 times in each sitting for at least 3 days in a row and send in readings to [place]. Otherwise her blood pressure reading is too high.  And consideration of more blood pressure medicine. Blood pressure goal is average of 130/80 or thereabouts. Losing 3 to 5 pounds in a healthy manner may also be helpful for blood pressure control.  Plan follow-up depending on lab results or 4 to 6 months.  Health Maintenance, Female Adopting a healthy lifestyle and getting preventive care are important in promoting health and wellness. Ask your health care provider about: The right schedule for you to have regular tests and exams. Things you can do on your own to prevent diseases and keep yourself healthy. What should I know about diet, weight, and exercise? Eat a healthy diet  Eat a diet that includes plenty of vegetables, fruits, low-fat dairy products, and lean protein. Do not eat a lot of foods that are high in solid fats, added sugars, or sodium.  Maintain a healthy weight Body mass index (BMI) is used to identify weight problems. It estimates body fat based on height and weight. Your health care provider can help determineyour BMI and help you achieve or maintain a healthy weight. Get regular exercise Get regular exercise. This is one of the most important things you can do for your health. Most adults should: Exercise for at least 150 minutes each week. The exercise should increase your heart rate and make you sweat (moderate-intensity exercise). Do strengthening exercises at least twice a week. This is in addition to the moderate-intensity exercise. Spend less time sitting. Even light physical activity can be beneficial. Watch cholesterol and blood lipids Have your blood tested for lipids and cholesterol at 77 years of age, then havethis test  every 5 years. Have your cholesterol levels checked more often if: Your lipid or cholesterol levels are high. You are older than 77 years of age. You are at high risk for heart disease. What should I know about cancer screening? Depending on your health history and family history, you may need to have cancer screening at various ages. This may include screening for: Breast cancer. Cervical cancer. Colorectal cancer. Skin cancer. Lung cancer. What should I know about heart disease, diabetes, and high blood pressure? Blood pressure and heart disease High blood pressure causes heart disease and increases the risk of stroke. This is more likely to develop in people who have high blood pressure readings, are of African descent, or are overweight. Have your blood pressure checked: Every 3-5 years if you are 95-51 years of age. Every year if you are 69 years old or older. Diabetes Have regular diabetes screenings. This checks your fasting blood sugar level. Have the screening done: Once every three years after age 59 if you are at a normal weight and have a low risk for diabetes. More often and at a younger age if you are overweight or have a high risk for diabetes. What should I know about preventing infection? Hepatitis B If you have a higher risk for hepatitis B, you should be screened for this virus. Talk with your health care provider to find out if you are at risk forhepatitis B infection. Hepatitis C Testing is recommended for: Everyone born from [birth year] through 1967. Anyone with known risk factors for hepatitis C. Sexually transmitted infections (STIs) Get screened for  STIs, including gonorrhea and chlamydia, if: You are sexually active and are younger than 77 years of age. You are older than 77 years of age and your health care provider tells you that you are at risk for this type of infection. Your sexual activity has changed since you were last screened, and you are at increased  risk for chlamydia or gonorrhea. Ask your health care provider if you are at risk. Ask your health care provider about whether you are at high risk for HIV. Your health care provider may recommend a prescription medicine to help prevent HIV infection. If you choose to take medicine to prevent HIV, you should first get tested for HIV. You should then be tested every 3 months for as long as you are taking the medicine. Pregnancy If you are about to stop having your period (premenopausal) and you may become pregnant, seek counseling before you get pregnant. Take 400 to 800 micrograms (mcg) of folic acid every day if you become pregnant. Ask for birth control (contraception) if you want to prevent pregnancy. Osteoporosis and menopause Osteoporosis is a disease in which the bones lose minerals and strength with aging. This can result in bone fractures. If you are 27 years old or older, or if you are at risk for osteoporosis and fractures, ask your health care provider if you should: Be screened for bone loss. Take a calcium or vitamin D supplement to lower your risk of fractures. Be given hormone replacement therapy (HRT) to treat symptoms of menopause. Follow these instructions at home: Lifestyle Do not use any products that contain nicotine or tobacco, such as cigarettes, e-cigarettes, and chewing tobacco. If you need help quitting, ask your health care provider. Do not use street drugs. Do not share needles. Ask your health care provider for help if you need support or information about quitting drugs. Alcohol use Do not drink alcohol if: Your health care provider tells you not to drink. You are pregnant, may be pregnant, or are planning to become pregnant. If you drink alcohol: Limit how much you use to 0-1 drink a day. Limit intake if you are breastfeeding. Be aware of how much alcohol is in your drink. In the [place] one drink equals one 12 oz bottle of beer (355 mL), one 5 oz glass of wine  (148 mL), or one 1 oz glass of hard liquor (44 mL). General instructions Schedule regular health, dental, and eye exams. Stay current with your vaccines. Tell your health care provider if: You often feel depressed. You have ever been abused or do not feel safe at home. Summary Adopting a healthy lifestyle and getting preventive care are important in promoting health and wellness. Follow your health care provider's instructions about healthy diet, exercising, and getting tested or screened for diseases. Follow your health care provider's instructions on monitoring your cholesterol and blood pressure. This information is not intended to replace advice given to you by your health care provider. Make sure you discuss any questions you have with your healthcare provider. Document Revised: 08/08/2018 Document Reviewed: 08/08/2018 Elsevier Patient Education  2022 [employer].

## 2021-03-11 DIAGNOSIS — M9903 Segmental and somatic dysfunction of lumbar region: Secondary | ICD-10-CM | POA: Diagnosis not present

## 2021-03-11 DIAGNOSIS — M9904 Segmental and somatic dysfunction of sacral region: Secondary | ICD-10-CM | POA: Diagnosis not present

## 2021-03-11 DIAGNOSIS — M9905 Segmental and somatic dysfunction of pelvic region: Secondary | ICD-10-CM | POA: Diagnosis not present

## 2021-03-11 DIAGNOSIS — M5136 Other intervertebral disc degeneration, lumbar region: Secondary | ICD-10-CM | POA: Diagnosis not present

## 2021-03-21 NOTE — Progress Notes (Signed)
Blood sugar stable  controlled diabetic range  cholesterol at goal. Thyroid is nl.  Slight increase of  protein in  urine.  Record review . No obvious  other cause of hiccups.   Gabapentin can help  sometimes  .  Please make fu appt for about 3 -4 months  or earlier  if hiccups getting worse or want to discuss other medication.

## 2021-03-22 ENCOUNTER — Telehealth: Payer: Self-pay | Admitting: Internal Medicine

## 2021-03-22 NOTE — Telephone Encounter (Signed)
See results note. 

## 2021-03-22 NOTE — Telephone Encounter (Signed)
Patient returned [facility] call for lab results

## 2021-03-23 DIAGNOSIS — M9904 Segmental and somatic dysfunction of sacral region: Secondary | ICD-10-CM | POA: Diagnosis not present

## 2021-03-23 DIAGNOSIS — M9905 Segmental and somatic dysfunction of pelvic region: Secondary | ICD-10-CM | POA: Diagnosis not present

## 2021-03-23 DIAGNOSIS — M5136 Other intervertebral disc degeneration, lumbar region: Secondary | ICD-10-CM | POA: Diagnosis not present

## 2021-03-23 DIAGNOSIS — M9903 Segmental and somatic dysfunction of lumbar region: Secondary | ICD-10-CM | POA: Diagnosis not present

## 2021-03-30 DIAGNOSIS — M9904 Segmental and somatic dysfunction of sacral region: Secondary | ICD-10-CM | POA: Diagnosis not present

## 2021-03-30 DIAGNOSIS — M9903 Segmental and somatic dysfunction of lumbar region: Secondary | ICD-10-CM | POA: Diagnosis not present

## 2021-03-30 DIAGNOSIS — M9905 Segmental and somatic dysfunction of pelvic region: Secondary | ICD-10-CM | POA: Diagnosis not present

## 2021-03-30 DIAGNOSIS — M5136 Other intervertebral disc degeneration, lumbar region: Secondary | ICD-10-CM | POA: Diagnosis not present

## 2021-04-03 ENCOUNTER — Other Ambulatory Visit: Payer: Self-pay | Admitting: Internal Medicine

## 2021-04-06 DIAGNOSIS — M9904 Segmental and somatic dysfunction of sacral region: Secondary | ICD-10-CM | POA: Diagnosis not present

## 2021-04-06 DIAGNOSIS — M5136 Other intervertebral disc degeneration, lumbar region: Secondary | ICD-10-CM | POA: Diagnosis not present

## 2021-04-06 DIAGNOSIS — M9903 Segmental and somatic dysfunction of lumbar region: Secondary | ICD-10-CM | POA: Diagnosis not present

## 2021-04-06 DIAGNOSIS — M9905 Segmental and somatic dysfunction of pelvic region: Secondary | ICD-10-CM | POA: Diagnosis not present

## 2021-04-16 ENCOUNTER — Ambulatory Visit: Payer: Medicare Other | Admitting: Gastroenterology

## 2021-04-16 ENCOUNTER — Encounter: Payer: Self-pay | Admitting: Gastroenterology

## 2021-04-16 ENCOUNTER — Other Ambulatory Visit (INDEPENDENT_AMBULATORY_CARE_PROVIDER_SITE_OTHER): Payer: Medicare Other

## 2021-04-16 VITALS — BP 142/80 | HR 57 | Ht 61.25 in | Wt 155.0 lb

## 2021-04-16 DIAGNOSIS — D509 Iron deficiency anemia, unspecified: Secondary | ICD-10-CM

## 2021-04-16 LAB — IBC + FERRITIN
Ferritin: 22.9 ng/mL (ref 10.0–291.0)
Iron: 72 ug/dL (ref 42–145)
Saturation Ratios: 18.1 % — ABNORMAL LOW (ref 20.0–50.0)
TIBC: 397.6 ug/dL (ref 250.0–450.0)
Transferrin: 284 mg/dL (ref 212.0–360.0)

## 2021-04-16 LAB — HEMOGLOBIN: Hemoglobin: 12.5 g/dL (ref 12.0–15.0)

## 2021-04-16 LAB — IRON: Iron: 72 ug/dL (ref 42–145)

## 2021-04-16 MED ORDER — PANTOPRAZOLE SODIUM 40 MG PO TBEC: 40.0000 mg | DELAYED_RELEASE_TABLET | ORAL | 3 refills | Status: DC

## 2021-04-16 MED ORDER — FERROUS SULFATE 325 (65 FE) MG PO TABS: 325.0000 mg | ORAL_TABLET | ORAL | 11 refills | Status: AC

## 2021-04-16 NOTE — Patient Instructions (Addendum)
It was my pleasure to provide care to you today. Based on our discussion, I am providing you with my recommendations below:  RECOMMENDATION(S):   Avoid Carbonated beverages Avoid chewing gum Take oral iron supplements every other day  PRESCRIPTION MEDICATION(S):   We have sent the following medication(s) to your pharmacy:  Pantoprazole  NOTE: If your medication(s) requires a PRIOR AUTHORIZATION, we will receive notification from your pharmacy. Once received, the process to submit for approval may take up to 7-10 business days. You will be contacted about any denials we have received from your insurance company as well as alternatives recommended by your provider.  LABS:   Please proceed to the basement level for lab work before leaving today. Press "B" on the elevator. The lab is located at the first door on the left as you exit the elevator.  HEALTHCARE LAWS AND MY CHART RESULTS:   Due to recent changes in healthcare laws, you may see results of your imaging and/or laboratory studies on MyChart before I have had a chance to review them.  I understand that in some cases there may be results that are confusing or concerning to you. Please understand that not all results are received at same time and often I may need to interpret multiple results in order to provide you with the best plan of care or course of treatment. Therefore, I ask that you please give me 48 hours to thoroughly review all your results before contacting my office for clarification.   FOLLOW UP:  Continue close follow up with Dr. Fabian Sharp I would like for you to follow up with me as needed. Please call the office at [phone] to schedule your appointment.  BMI:  If you are age 35 or older, your body mass index should be between 23-30. Your Body mass index is 29.05 kg/m. If this is out of the aforementioned range listed, please consider follow up with your Primary Care Provider.  MY CHART:  The [facility]  providers would like to encourage you to use [facility] to communicate with providers for non-urgent requests or questions.  Due to long hold times on the telephone, sending your provider a message by [facility] may be a faster and more efficient way to get a response.  Please allow 48 business hours for a response.  Please remember that this is for non-urgent requests.   Thank you for trusting me with your gastrointestinal care!    Tressia Danas, MD, MPH

## 2021-04-16 NOTE — Progress Notes (Signed)
Referring Provider: Madelin Headings, MD Primary Care Physician:  Madelin Headings, MD  Chief complaint:  Hiccups, reflux, and iron deficiency   IMPRESSION:  Daily, intermittent Hiccups not explained by EGD or improved on PPI BID ? Esophageal varices on EGD with normal platelets Echogenic liver on ultrasound History of esophageal stricture without obstructive symptoms    - empiric dilation performed 11/24/20 Hiatal hernia Iron deficiency without anemia anemia Diverticulosis without history of diverticulitis  Hiccups: No esophageal and gastric etiologies identified on EGD to explain her hiccups. Some improvement with PPI. No symptomatic relief with baclofen. No additional GI evaluation planned at this time.   Iron deficiency: Without anemia. No overt bleeding. 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 Will repeat iron deficiency labs today. Discussed possible EGD at the hospital with attempt at resection. She was reluctant to proceed with endoscopy and is hoping to avoid it all together.   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 Repeat EGD at the hospital with attempt at resection recommended. She will consider the procedure and call me back to schedule if she wishes to proceed.   Prominent esophageal veins on EGD: ? Early varices. Platelets normal. Abdominal ultrasound showed no evidence for cirrhosis or portal hypertension. Low threshold to consider elastography, particularly given her diabetes.   History of esophageal stricture: Remains asymptomatic.   Discussed colon cancer screening. She continues to decline colonoscopy, despite iron deficiency.   Diverticulosis: High fiber diet recommended.     PLAN: - Continue oral iron supplements  - Repeat iron, ferritin, hemoglobin  - Avoid carbonated beverage, chewing gum - Continue pantoprazole 40 mg QAM - Consider elastography  - Consider EGD with gastric polypectomies (she will consider and call  if she would like to schedule) - Continue close follow-up with Dr. Fabian Sharp - Follow-up as needed  I spent over 30 minutes, including in depth chart review, independent review of results, communicating results with the patient directly, face-to-face time with the patient, coordinating care, and ordering studies and medications as appropriate, and documentation.   HPI: Jowana Thumma is a 75 y.o. female initially referred by Dr. Fabian Sharp for hiccups, reflux, and iron deficiency. She has a history of childhood asthma, diabetes, hypertension, reflux, and kidney stones.   Developed near daily hiccups last years. Had been occurring every time that she eats as well as episodes that occur without eating. No identified food triggers. Not associated with overeating. Rare carbonated beverages. Rare use of chewing gum. She does not smoke. No heartburn, dysphagia, dysphonia. Has a frequent cough that she can control if her head is elevated.  No concurrent headache or neurologic symptoms. She also wishes to avoid medications.   EGD 11/24/20 showed a benign distal esophageal stricture dilated with a 16 to 18 mm TTS balloon, 3 prominent veins in the mid esophagus suggesting early varices, gastritis, gastric erosions, multiple benign gastric polyps, a small hiatal hernia.  Antral biopsies showed reactive gastropathy and focal intestinal metaplasia.  Fundus and gastric body biopsy showed mild chronic gastritis.  There was no H. pylori.  Gastric polyps were hyperplastic polyps with eroded granulation tissue.  Esophageal biopsies were normal.  Hiccups slightly improved on pantoprazole. Trial of baclofen 5 mg TID provided no relief. She denies the use of any NSAIDs. She has had no dysphagia before or after her endoscopy.   Has started oral iron supplements for a new diagnosis of iron deficiency without anemia. She does not want to take IV iron. She  has a friend who has not "done well" with IV iron. Tolerating her iron  supplements without side effects.   Abdominal ultrasound 01/14/21 to evaluate for her possible esophageal varices showed echogenic liver and was otherwise normal. Spleen normal.  Prior endoscopic history: - EGD 10/22/2003: Esophageal stricture dilated from 14-2mm savary dilator, small hiatal hernia, and biopsy-proven reflux - Colonoscopy with Dr. Juanda Chance 12/22/2004: diverticulosis in the left colon - Cologuard 08/31/16: negative - She declined further colonoscopy or Cologuard given her age.   Labs 09/07/20: iron 52, ferritin 14, hemoglobin 12.6, MCV 85.6, RDW 15.1, platelets 291 Labs 01/05/21: iron 54, ferritin 15.9, hemoglobin 12.2 Labs 03/09/21: hemoglobin 12.7, MCV 86.5, platelets 297, normal liver enzymes  Prior abdominal imaging: - CT abd/pelvis with contrast 09/11/14 for abdominal pain showed diverticulosis, renal calculi with possible UPJ stricture, large hiatal hernia - Abdominal ultrasound 01/14/21 to evaluate for her possible esophageal varices showed echogenic liver and was otherwise normal. Spleen normal.    Past Medical History:  Diagnosis Date   Allergy    Asthma due to environmental allergies    no inhaler   Diabetes mellitus without complication (HCC)    Diverticulosis of colon    GERD (gastroesophageal reflux disease)    History of esophageal dilatation    FOR STRICTURE IN 2005   History of hiatal hernia    History of hypercalcemia    SECONDARY TO HYPERPARATHYROID   History of hyperparathyroidism    PRIMARY--  S/P RIGHT PARATHYROIDECTOMY   History of kidney stones    History of recurrent UTIs    Hyperlipidemia    Hypertension    Hypothyroidism    Incomplete right bundle branch block    Nephrolithiasis    RIGHT    Nocturia    PONV (postoperative nausea and vomiting)    Pre-diabetes    Sciatica of right side     Past Surgical History:  Procedure Laterality Date   APPENDECTOMY  1974   CYSTOSCOPY WITH URETEROSCOPY AND STENT PLACEMENT Right 11/11/2014    Procedure: CYSTOSCOPY WITH URETEROSCOPY AND STENT PLACEMENT;  Surgeon: Crist Fat, MD;  Location: [facility];  Service: Urology;  Laterality: Right;   EXCISION URETHRAL CARUNCLE  1981   Open procedure   KNEE ARTHROSCOPY Left april 2010   REMOVAL RIP  1973   First Rib on Right side-- for impingement   RIGHT INFERIOR PARATHYROIDECTOMY, MINIMALLY INVASIVE  08-14-2008   TONSILLECTOMY  as child   TOTAL KNEE ARTHROPLASTY Left 11-17-2009   TUBAL LIGATION  1973   VAGINAL HYSTERECTOMY  1995   w/ Bilateral Salpingoophorectomy    Current Outpatient Medications  Medication Sig Dispense Refill   atorvastatin (LIPITOR) 20 MG tablet TAKE 1 TABLET BY MOUTH IN  THE EVENING 90 tablet 1   baclofen (LIORESAL) 10 MG tablet Take 0.5 tablets (5 mg total) by mouth 3 (three) times daily. 15 each 0   cholecalciferol (VITAMIN D3) 25 MCG (1000 UNIT) tablet Take 1,000 Units by mouth daily.     Coenzyme Q10 (COQ-10) 400 MG CAPS Take 1 capsule by mouth daily.     conjugated estrogens (PREMARIN) vaginal cream Place 1 Applicatorful vaginally as directed. Use 2-3 times weekly  prn 42.5 g 2   FERROUS SULFATE PO Take by mouth daily.     gabapentin (NEURONTIN) 100 MG capsule TAKE 1 TO 3 CAPSULES BY  MOUTH AT BEDTIME AS  DIRECTED 270 capsule 1   levothyroxine (SYNTHROID) 75 MCG tablet TAKE 1 TABLET BY MOUTH  DAILY  90 tablet 3   Magnesium 500 MG TABS Take 2 tablets by mouth daily.     metFORMIN (GLUCOPHAGE-XR) 500 MG 24 hr tablet TAKE 3 TABLETS BY MOUTH  DAILY 270 tablet 1   pantoprazole (PROTONIX) 40 MG tablet Take 40 mg by mouth daily.     pyridOXINE (VITAMIN B-6) 100 MG tablet Take 100 mg by mouth daily.     TURMERIC CURCUMIN PO Take 1 tablet by mouth daily.     valsartan-hydrochlorothiazide (DIOVAN-HCT) 160-25 MG tablet TAKE 1 TABLET BY MOUTH  DAILY 90 tablet 3   Current Facility-Administered Medications  Medication Dose Route Frequency Provider Last Rate Last Admin   betamethasone  acetate-betamethasone sodium phosphate (CELESTONE) injection 3 mg  3 mg Intramuscular Once Gala Lewandowsky M, DPM       betamethasone acetate-betamethasone sodium phosphate (CELESTONE) injection 3 mg  3 mg Intramuscular Once Felecia Shelling, DPM        Allergies as of 04/16/2021 - Review Complete 04/16/2021  Allergen Reaction Noted   Contrast media [iodinated diagnostic agents] Shortness Of Breath 09/10/2014   Codeine Nausea Only 11/06/2014   Shrimp [shellfish allergy] Swelling 11/07/2014   Topamax [topiramate] Rash 03/04/2019    Family History  Problem Relation Age of Onset   Cancer Father        brain   Cancer Son        liver   Diabetes Sister    Schizophrenia Son    Colon cancer Neg Hx    Colon polyps Neg Hx    Esophageal cancer Neg Hx    Stomach cancer Neg Hx    Pancreatic cancer Neg Hx    Rectal cancer Neg Hx      Physical Exam: General:   Alert,  well-nourished, pleasant and cooperative in NAD Head:  Normocephalic and atraumatic. Eyes:  Sclera clear, no icterus.   Conjunctiva pink. Abdomen:  Soft, nontender, nondistended, normal bowel sounds, no rebound or guarding. No hepatosplenomegaly.   Neurologic:  Alert and  oriented x4;  grossly nonfocal Skin:  Intact without significant lesions or rashes. Psych:  Alert and cooperative. Normal mood and affect.     L. Orvan Falconer, MD, MPH 04/16/2021, 11:07 AM

## 2021-04-20 ENCOUNTER — Telehealth: Payer: Self-pay

## 2021-04-20 DIAGNOSIS — M9905 Segmental and somatic dysfunction of pelvic region: Secondary | ICD-10-CM | POA: Diagnosis not present

## 2021-04-20 DIAGNOSIS — M9903 Segmental and somatic dysfunction of lumbar region: Secondary | ICD-10-CM | POA: Diagnosis not present

## 2021-04-20 DIAGNOSIS — M5136 Other intervertebral disc degeneration, lumbar region: Secondary | ICD-10-CM | POA: Diagnosis not present

## 2021-04-20 DIAGNOSIS — M9904 Segmental and somatic dysfunction of sacral region: Secondary | ICD-10-CM | POA: Diagnosis not present

## 2021-04-20 NOTE — Telephone Encounter (Signed)
Patient returning call.

## 2021-04-20 NOTE — Telephone Encounter (Signed)
Please see result note 

## 2021-04-21 ENCOUNTER — Other Ambulatory Visit: Payer: Self-pay

## 2021-04-21 DIAGNOSIS — D509 Iron deficiency anemia, unspecified: Secondary | ICD-10-CM

## 2021-05-13 DIAGNOSIS — M5136 Other intervertebral disc degeneration, lumbar region: Secondary | ICD-10-CM | POA: Diagnosis not present

## 2021-05-13 DIAGNOSIS — M9904 Segmental and somatic dysfunction of sacral region: Secondary | ICD-10-CM | POA: Diagnosis not present

## 2021-05-13 DIAGNOSIS — M9905 Segmental and somatic dysfunction of pelvic region: Secondary | ICD-10-CM | POA: Diagnosis not present

## 2021-05-13 DIAGNOSIS — M9903 Segmental and somatic dysfunction of lumbar region: Secondary | ICD-10-CM | POA: Diagnosis not present

## 2021-05-19 DIAGNOSIS — M9904 Segmental and somatic dysfunction of sacral region: Secondary | ICD-10-CM | POA: Diagnosis not present

## 2021-05-19 DIAGNOSIS — M9905 Segmental and somatic dysfunction of pelvic region: Secondary | ICD-10-CM | POA: Diagnosis not present

## 2021-05-19 DIAGNOSIS — M9903 Segmental and somatic dysfunction of lumbar region: Secondary | ICD-10-CM | POA: Diagnosis not present

## 2021-05-19 DIAGNOSIS — M5136 Other intervertebral disc degeneration, lumbar region: Secondary | ICD-10-CM | POA: Diagnosis not present

## 2021-06-03 DIAGNOSIS — M5136 Other intervertebral disc degeneration, lumbar region: Secondary | ICD-10-CM | POA: Diagnosis not present

## 2021-06-03 DIAGNOSIS — M9904 Segmental and somatic dysfunction of sacral region: Secondary | ICD-10-CM | POA: Diagnosis not present

## 2021-06-03 DIAGNOSIS — M9903 Segmental and somatic dysfunction of lumbar region: Secondary | ICD-10-CM | POA: Diagnosis not present

## 2021-06-03 DIAGNOSIS — M9905 Segmental and somatic dysfunction of pelvic region: Secondary | ICD-10-CM | POA: Diagnosis not present

## 2021-06-29 DIAGNOSIS — M5136 Other intervertebral disc degeneration, lumbar region: Secondary | ICD-10-CM | POA: Diagnosis not present

## 2021-06-29 DIAGNOSIS — M9904 Segmental and somatic dysfunction of sacral region: Secondary | ICD-10-CM | POA: Diagnosis not present

## 2021-06-29 DIAGNOSIS — M9903 Segmental and somatic dysfunction of lumbar region: Secondary | ICD-10-CM | POA: Diagnosis not present

## 2021-06-29 DIAGNOSIS — M9905 Segmental and somatic dysfunction of pelvic region: Secondary | ICD-10-CM | POA: Diagnosis not present

## 2021-07-13 DIAGNOSIS — M5136 Other intervertebral disc degeneration, lumbar region: Secondary | ICD-10-CM | POA: Diagnosis not present

## 2021-07-13 DIAGNOSIS — M9905 Segmental and somatic dysfunction of pelvic region: Secondary | ICD-10-CM | POA: Diagnosis not present

## 2021-07-13 DIAGNOSIS — M9904 Segmental and somatic dysfunction of sacral region: Secondary | ICD-10-CM | POA: Diagnosis not present

## 2021-07-13 DIAGNOSIS — M9903 Segmental and somatic dysfunction of lumbar region: Secondary | ICD-10-CM | POA: Diagnosis not present

## 2021-07-15 DIAGNOSIS — H2513 Age-related nuclear cataract, bilateral: Secondary | ICD-10-CM | POA: Diagnosis not present

## 2021-07-15 DIAGNOSIS — H401131 Primary open-angle glaucoma, bilateral, mild stage: Secondary | ICD-10-CM | POA: Diagnosis not present

## 2021-07-15 DIAGNOSIS — E119 Type 2 diabetes mellitus without complications: Secondary | ICD-10-CM | POA: Diagnosis not present

## 2021-07-15 LAB — HM DIABETES EYE EXAM

## 2021-07-28 DIAGNOSIS — M9903 Segmental and somatic dysfunction of lumbar region: Secondary | ICD-10-CM | POA: Diagnosis not present

## 2021-07-28 DIAGNOSIS — M5136 Other intervertebral disc degeneration, lumbar region: Secondary | ICD-10-CM | POA: Diagnosis not present

## 2021-07-28 DIAGNOSIS — M9904 Segmental and somatic dysfunction of sacral region: Secondary | ICD-10-CM | POA: Diagnosis not present

## 2021-07-28 DIAGNOSIS — M9905 Segmental and somatic dysfunction of pelvic region: Secondary | ICD-10-CM | POA: Diagnosis not present

## 2021-08-04 DIAGNOSIS — M5136 Other intervertebral disc degeneration, lumbar region: Secondary | ICD-10-CM | POA: Diagnosis not present

## 2021-08-04 DIAGNOSIS — M9903 Segmental and somatic dysfunction of lumbar region: Secondary | ICD-10-CM | POA: Diagnosis not present

## 2021-08-04 DIAGNOSIS — M9904 Segmental and somatic dysfunction of sacral region: Secondary | ICD-10-CM | POA: Diagnosis not present

## 2021-08-04 DIAGNOSIS — M9905 Segmental and somatic dysfunction of pelvic region: Secondary | ICD-10-CM | POA: Diagnosis not present

## 2021-08-06 DIAGNOSIS — Z1231 Encounter for screening mammogram for malignant neoplasm of breast: Secondary | ICD-10-CM | POA: Diagnosis not present

## 2021-08-06 LAB — HM MAMMOGRAPHY

## 2021-08-10 ENCOUNTER — Encounter: Payer: Self-pay | Admitting: Internal Medicine

## 2021-08-11 DIAGNOSIS — M9904 Segmental and somatic dysfunction of sacral region: Secondary | ICD-10-CM | POA: Diagnosis not present

## 2021-08-11 DIAGNOSIS — M9903 Segmental and somatic dysfunction of lumbar region: Secondary | ICD-10-CM | POA: Diagnosis not present

## 2021-08-11 DIAGNOSIS — M9905 Segmental and somatic dysfunction of pelvic region: Secondary | ICD-10-CM | POA: Diagnosis not present

## 2021-08-11 DIAGNOSIS — M5136 Other intervertebral disc degeneration, lumbar region: Secondary | ICD-10-CM | POA: Diagnosis not present

## 2021-08-16 ENCOUNTER — Other Ambulatory Visit (HOSPITAL_BASED_OUTPATIENT_CLINIC_OR_DEPARTMENT_OTHER): Payer: Self-pay

## 2021-08-16 ENCOUNTER — Emergency Department (HOSPITAL_BASED_OUTPATIENT_CLINIC_OR_DEPARTMENT_OTHER)
Admission: EM | Admit: 2021-08-16 | Discharge: 2021-08-16 | Disposition: A | Discharge status: Home / Self Care | Payer: Medicare Other | Attending: Emergency Medicine | Admitting: Emergency Medicine

## 2021-08-16 ENCOUNTER — Emergency Department (HOSPITAL_BASED_OUTPATIENT_CLINIC_OR_DEPARTMENT_OTHER): Payer: Medicare Other | Admitting: Radiology

## 2021-08-16 ENCOUNTER — Encounter (HOSPITAL_BASED_OUTPATIENT_CLINIC_OR_DEPARTMENT_OTHER): Payer: Self-pay | Admitting: Emergency Medicine

## 2021-08-16 ENCOUNTER — Other Ambulatory Visit: Payer: Self-pay

## 2021-08-16 DIAGNOSIS — R21 Rash and other nonspecific skin eruption: Secondary | ICD-10-CM | POA: Insufficient documentation

## 2021-08-16 DIAGNOSIS — Z7984 Long term (current) use of oral hypoglycemic drugs: Secondary | ICD-10-CM | POA: Diagnosis not present

## 2021-08-16 DIAGNOSIS — J45909 Unspecified asthma, uncomplicated: Secondary | ICD-10-CM | POA: Insufficient documentation

## 2021-08-16 DIAGNOSIS — U071 COVID-19: Secondary | ICD-10-CM | POA: Insufficient documentation

## 2021-08-16 DIAGNOSIS — Z79899 Other long term (current) drug therapy: Secondary | ICD-10-CM | POA: Insufficient documentation

## 2021-08-16 DIAGNOSIS — I10 Essential (primary) hypertension: Secondary | ICD-10-CM | POA: Diagnosis not present

## 2021-08-16 DIAGNOSIS — E119 Type 2 diabetes mellitus without complications: Secondary | ICD-10-CM | POA: Diagnosis not present

## 2021-08-16 DIAGNOSIS — H9201 Otalgia, right ear: Secondary | ICD-10-CM | POA: Insufficient documentation

## 2021-08-16 DIAGNOSIS — E039 Hypothyroidism, unspecified: Secondary | ICD-10-CM | POA: Insufficient documentation

## 2021-08-16 DIAGNOSIS — Z96652 Presence of left artificial knee joint: Secondary | ICD-10-CM | POA: Diagnosis not present

## 2021-08-16 DIAGNOSIS — R059 Cough, unspecified: Secondary | ICD-10-CM | POA: Diagnosis not present

## 2021-08-16 LAB — RESP PANEL BY RT-PCR (FLU A&B, COVID) ARPGX2
Influenza A by PCR: NEGATIVE
Influenza B by PCR: NEGATIVE
SARS Coronavirus 2 by RT PCR: POSITIVE — AB

## 2021-08-16 MED ORDER — BENZONATATE 100 MG PO CAPS
100.0000 mg | ORAL_CAPSULE | Freq: Three times a day (TID) | ORAL | 0 refills | Status: DC
  Filled 2021-08-16: qty 21, 7d supply, fill #0

## 2021-08-16 MED ORDER — MOLNUPIRAVIR 200 MG PO CAPS
4.0000 | ORAL_CAPSULE | Freq: Two times a day (BID) | ORAL | 0 refills | Status: AC
  Filled 2021-08-16: qty 40, 5d supply, fill #0

## 2021-08-16 NOTE — Discharge Instructions (Signed)
Take the medications as prescribed.  Return for high fevers, shortness of breath, inability to keep down your medications.

## 2021-08-16 NOTE — ED Provider Notes (Signed)
[facility] EMERGENCY DEPT Provider Note   CSN: [id number] Arrival date & time: 08/16/21  1015     History Chief Complaint  Patient presents with   Cough   Rash    Kerry Walters is a 75 y.o. female.   Patient presented to the ER for evaluation of URI type symptoms.  Patient states she started having symptoms for 5 days ago.  She started on cough sneezing headache right ear pain.  Patient states her symptoms persisted and unfortunately her doctor is out of town so she came in for evaluation.  She is not having any high fevers.  She does not feel short of breath.  Patient does have generalized malaise.  Past Medical History:  Diagnosis Date   Allergy    Asthma due to environmental allergies    no inhaler   Diabetes mellitus without complication (HCC)    Diverticulosis of colon    GERD (gastroesophageal reflux disease)    History of esophageal dilatation    FOR STRICTURE IN 2005   History of hiatal hernia    History of hypercalcemia    SECONDARY TO HYPERPARATHYROID   History of hyperparathyroidism    PRIMARY--  S/P RIGHT PARATHYROIDECTOMY   History of kidney stones    History of recurrent UTIs    Hyperlipidemia    Hypertension    Hypothyroidism    Incomplete right bundle branch block    Nephrolithiasis    RIGHT    Nocturia    PONV (postoperative nausea and vomiting)    Pre-diabetes    Sciatica of right side     Patient Active Problem List   Diagnosis Date Noted   Personal history of kidney stones 02/23/2015   Atherosclerosis 02/23/2015   Hx of low back pain 05/06/2014   Encounter for preventive health examination 05/06/2014   BMI 32.0-32.9,adult 11/05/2013   Throat clearing 05/03/2013   Nocturnal leg cramps 10/31/2012   Low back pain 12/27/2011   Pre-diabetes 09/27/2011   Menopausal hot flushes 04/12/2011   Knee pain 11/22/2010   OBESITY 12/23/2008   NEPHROLITHIASIS, HX OF 03/13/2008   Hypothyroidism 06/29/2007   HYPERLIPIDEMIA 06/29/2007    Essential hypertension 06/29/2007   ALLERGIC RHINITIS 06/29/2007   GERD 06/29/2007   POSTMENOPAUSAL STATUS 06/29/2007   LEG PAIN, CHRONIC 06/29/2007   HYPERGLYCEMIA, FASTING 06/29/2007    Past Surgical History:  Procedure Laterality Date   APPENDECTOMY  1974   CYSTOSCOPY WITH URETEROSCOPY AND STENT PLACEMENT Right 11/11/2014   Procedure: CYSTOSCOPY WITH URETEROSCOPY AND STENT PLACEMENT;  Surgeon: Crist Fat, MD;  Location: [facility];  Service: Urology;  Laterality: Right;   EXCISION URETHRAL CARUNCLE  1981   Open procedure   KNEE ARTHROSCOPY Left april 2010   REMOVAL RIP  1973   First Rib on Right side-- for impingement   RIGHT INFERIOR PARATHYROIDECTOMY, MINIMALLY INVASIVE  08-14-2008   TONSILLECTOMY  as child   TOTAL KNEE ARTHROPLASTY Left 11-17-2009   TUBAL LIGATION  1973   VAGINAL HYSTERECTOMY  1995   w/ Bilateral Salpingoophorectomy     OB History   No obstetric history on file.     Family History  Problem Relation Age of Onset   Cancer Father        brain   Cancer Son        liver   Diabetes Sister    Schizophrenia Son    Colon cancer Neg Hx    Colon polyps Neg Hx    Esophageal  cancer Neg Hx    Stomach cancer Neg Hx    Pancreatic cancer Neg Hx    Rectal cancer Neg Hx     Social History   Tobacco Use   Smoking status: Never   Smokeless tobacco: Never  Vaping Use   Vaping Use: Never used  Substance Use Topics   Alcohol use: No   Drug use: No    Home Medications Prior to Admission medications   Medication Sig Start Date End Date Taking? Authorizing Provider  molnupiravir EUA (LAGEVRIO) 200 MG CAPS capsule Take 4 capsules (800 mg total) by mouth 2 (two) times daily for 5 days. 08/16/21 08/21/21 Yes Linwood Dibbles, MD  atorvastatin (LIPITOR) 20 MG tablet TAKE 1 TABLET BY MOUTH IN  THE EVENING 04/05/21   Panosh, Neta Mends, MD  cholecalciferol (VITAMIN D3) 25 MCG (1000 UNIT) tablet Take 1,000 Units by mouth daily.    [provider]  Coenzyme Q10 (COQ-10) 400 MG CAPS Take 1 capsule by mouth daily.    [provider]  conjugated estrogens (PREMARIN) vaginal cream Place 1 Applicatorful vaginally as directed. Use 2-3 times weekly  prn 10/01/19   Panosh, Neta Mends, MD  ferrous sulfate 325 (65 FE) MG tablet Take 1 tablet (325 mg total) by mouth every other day. 04/16/21   Tressia Danas, MD  gabapentin (NEURONTIN) 100 MG capsule TAKE 1 TO 3 CAPSULES BY  MOUTH AT BEDTIME AS  DIRECTED 04/06/20   Panosh, Neta Mends, MD  levothyroxine (SYNTHROID) 75 MCG tablet TAKE 1 TABLET BY MOUTH  DAILY 04/05/21   Panosh, Neta Mends, MD  Magnesium 500 MG TABS Take 2 tablets by mouth daily.    [provider]  metFORMIN (GLUCOPHAGE-XR) 500 MG 24 hr tablet TAKE 3 TABLETS BY MOUTH  DAILY 04/05/21   Panosh, Neta Mends, MD  pantoprazole (PROTONIX) 40 MG tablet Take 1 tablet (40 mg total) by mouth every morning. 04/16/21   Tressia Danas, MD  pyridOXINE (VITAMIN B-6) 100 MG tablet Take 100 mg by mouth daily.    [provider]  TURMERIC CURCUMIN PO Take 1 tablet by mouth daily.    [provider]  valsartan-hydrochlorothiazide (DIOVAN-HCT) 160-25 MG tablet TAKE 1 TABLET BY MOUTH  DAILY 04/05/21   Panosh, Neta Mends, MD    Allergies    Contrast media [iodinated diagnostic agents], Codeine, Shrimp [shellfish allergy], and Topamax [topiramate]  Review of Systems   Review of Systems  Respiratory:  Positive for cough.   Skin:  Positive for rash.  All other systems reviewed and are negative.  Physical Exam Updated Vital Signs BP (!) 170/56 (BP Location: Right Arm)    Pulse 67    Temp 97.8 F (36.6 C) (Oral)    Resp (!) 66    Ht 1.549 m (5\' 1" )    Wt 70.3 kg    SpO2 98%    BMI 29.29 kg/m   Physical Exam Vitals and nursing note reviewed.  Constitutional:      General: She is not in acute distress.    Appearance: She is well-developed.  HENT:     Head: Normocephalic and atraumatic.     Right Ear: Tympanic membrane  and external ear normal.     Left Ear: Tympanic membrane and external ear normal.     Mouth/Throat:     Pharynx: No oropharyngeal exudate or posterior oropharyngeal erythema.  Eyes:     General: No scleral icterus.       Right eye: No discharge.  Left eye: No discharge.     Conjunctiva/sclera: Conjunctivae normal.  Neck:     Trachea: No tracheal deviation.  Cardiovascular:     Rate and Rhythm: Normal rate and regular rhythm.  Pulmonary:     Effort: Pulmonary effort is normal. No respiratory distress.     Breath sounds: Normal breath sounds. No stridor. No wheezing or rales.  Abdominal:     General: Bowel sounds are normal. There is no distension.     Palpations: Abdomen is soft.     Tenderness: There is no abdominal tenderness. There is no guarding or rebound.  Musculoskeletal:        General: No tenderness or deformity.     Cervical back: Neck supple.  Skin:    General: Skin is warm and dry.     Findings: No rash.  Neurological:     General: No focal deficit present.     Mental Status: She is alert.     Cranial Nerves: No cranial nerve deficit (no facial droop, extraocular movements intact, no slurred speech).     Sensory: No sensory deficit.     Motor: No abnormal muscle tone or seizure activity.     Coordination: Coordination normal.  Psychiatric:        Mood and Affect: Mood normal.    ED Results / Procedures / Treatments   Labs (all labs ordered are listed, but only abnormal results are displayed) Labs Reviewed  RESP PANEL BY RT-PCR (FLU A&B, COVID) ARPGX2 - Abnormal; Notable for the following components:      Result Value   SARS Coronavirus 2 by RT PCR POSITIVE (*)    All other components within normal limits    EKG None  Radiology DG Chest 2 View  Result Date: 08/16/2021 CLINICAL DATA:  Cough for 5 days EXAM: CHEST - 2 VIEW COMPARISON:  03/04/2020 FINDINGS: The heart size and mediastinal contours are within normal limits. Atherosclerotic  calcification of the aortic knob. No focal airspace consolidation, pleural effusion, or pneumothorax. Mild thoracic spondylosis. IMPRESSION: No active cardiopulmonary disease. Electronically Signed   By: Duanne Guess D.O.   On: 08/16/2021 10:59    Procedures Procedures   Medications Ordered in ED Medications - No data to display  ED Course  I have reviewed the triage vital signs and the nursing notes.  Pertinent labs & imaging results that were available during my care of the patient were reviewed by me and considered in my medical decision making (see chart for details).  Clinical Course as of 08/16/21 1252  Mon Aug 16, 2021  1149 X-ray negative for acute findings [JK]  1158 GFR 48.09 in July 2022 [JK]    Clinical Course User Index [JK] Linwood Dibbles, MD   MDM Rules/Calculators/A&P                         Patient presented with URI symptoms.  Exam is reassuring.  She is oxygenating well.  Lungs are clear.  Chest x-ray does not show any evidence of pneumonia.  Patient's COVID and flu test was positive for COVID.  This certainly would account for her symptoms.  Will prescribe molnupiravir considering her allergies.  No indications for hospitalization at this time    Final Clinical Impression(s) / ED Diagnoses Final diagnoses:  COVID-19 virus infection    Rx / DC Orders ED Discharge Orders          Ordered    molnupiravir EUA (LAGEVRIO) 200  MG CAPS capsule  2 times daily        08/16/21 1250             Linwood Dibbles, MD 08/16/21 1254

## 2021-08-16 NOTE — ED Triage Notes (Addendum)
Cough, sneezing, headache, R ear pain x 5 days. Also reports a rash underneath both breasts.

## 2021-09-01 DIAGNOSIS — M9904 Segmental and somatic dysfunction of sacral region: Secondary | ICD-10-CM | POA: Diagnosis not present

## 2021-09-01 DIAGNOSIS — M5136 Other intervertebral disc degeneration, lumbar region: Secondary | ICD-10-CM | POA: Diagnosis not present

## 2021-09-01 DIAGNOSIS — M9905 Segmental and somatic dysfunction of pelvic region: Secondary | ICD-10-CM | POA: Diagnosis not present

## 2021-09-01 DIAGNOSIS — M9903 Segmental and somatic dysfunction of lumbar region: Secondary | ICD-10-CM | POA: Diagnosis not present

## 2021-09-02 ENCOUNTER — Telehealth: Payer: Self-pay | Admitting: Internal Medicine

## 2021-09-02 NOTE — Telephone Encounter (Signed)
Left message for patient to call back and schedule Medicare Annual Wellness Visit (AWV) either virtually or in office. Left  my Zachery Conch number [phone]   Last AWV 08/23/16  please schedule at anytime with [facility] Nurse Health Advisor 1 or 2   This should be a 45 minute visit.

## 2021-09-03 ENCOUNTER — Telehealth: Payer: Self-pay | Admitting: Internal Medicine

## 2021-09-03 NOTE — Telephone Encounter (Signed)
Spoke with patient to schedule Medicare Annual Wellness Visit (AWV) either virtually or in office.   Patient declined  not interested in AWV appt  Last AWV 08/23/16  please schedule at anytime with [facility] Nurse Health Advisor 1 or 2   This should be a 45 minute visit.

## 2021-09-13 NOTE — Progress Notes (Signed)
Chief Complaint  Patient presents with   Follow-up   Diabetes   Cystitis    HPI: Kerry Walters 76 y.o. come in for Chronic disease management   Had covid 19 12 19  has minimal respiratory cough fatigue leftover.  Doing much better Has developed acute UTI symptoms like cystitis for the last week without associated fever.  Described as painful urination frequency urgency.  No kidney stone symptoms but has a history of same. Blood pressure seems to have been in control.  No concerns. ROS: See pertinent positives and negatives per HPI.  Past Medical History:  Diagnosis Date   Allergy    Asthma due to environmental allergies    no inhaler   Diabetes mellitus without complication (HCC)    Diverticulosis of colon    GERD (gastroesophageal reflux disease)    History of esophageal dilatation    FOR STRICTURE IN 2005   History of hiatal hernia    History of hypercalcemia    SECONDARY TO HYPERPARATHYROID   History of hyperparathyroidism    PRIMARY--  S/P RIGHT PARATHYROIDECTOMY   History of kidney stones    History of recurrent UTIs    Hyperlipidemia    Hypertension    Hypothyroidism    Incomplete right bundle branch block    Nephrolithiasis    RIGHT    Nocturia    PONV (postoperative nausea and vomiting)    Pre-diabetes    Sciatica of right side     Family History  Problem Relation Age of Onset   Cancer Father        brain   Cancer Son        liver   Diabetes Sister    Schizophrenia Son    Colon cancer Neg Hx    Colon polyps Neg Hx    Esophageal cancer Neg Hx    Stomach cancer Neg Hx    Pancreatic cancer Neg Hx    Rectal cancer Neg Hx     Social History   Socioeconomic History   Marital status: Married    Spouse name: Not on file   Number of children: Not on file   Years of education: Not on file   Highest education level: Not on file  Occupational History   Not on file  Tobacco Use   Smoking status: Never   Smokeless tobacco: Never  Vaping Use    Vaping Use: Never used  Substance and Sexual Activity   Alcohol use: No   Drug use: No   Sexual activity: Not on file  Other Topics Concern   Not on file  Social History Narrative   Married   hh of 2   4 dogs   G2 P2   Water exercise.   Retired    [employer]    great grandchild          Social Determinants of Corporate investment bankerHealth   Financial Resource Strain: Not on BB&[employer] Corporationfile  Food Insecurity: Not on file  Transportation Needs: Not on file  Physical Activity: Not on file  Stress: Not on file  Social Connections: Not on file    Outpatient Medications Prior to Visit  Medication Sig Dispense Refill   atorvastatin (LIPITOR) 20 MG tablet TAKE 1 TABLET BY MOUTH IN  THE EVENING 90 tablet 1   benzonatate (TESSALON) 100 MG capsule Take 1 capsule (100 mg total) by mouth every 8 (eight) hours. 21 capsule 0   cholecalciferol (VITAMIN D3) 25 MCG (1000 UNIT) tablet Take 1,000 Units  by mouth daily.     Coenzyme Q10 (COQ-10) 400 MG CAPS Take 1 capsule by mouth daily.     conjugated estrogens (PREMARIN) vaginal cream Place 1 Applicatorful vaginally as directed. Use 2-3 times weekly  prn 42.5 g 2   ferrous sulfate 325 (65 FE) MG tablet Take 1 tablet (325 mg total) by mouth every other day. 15 tablet 11   gabapentin (NEURONTIN) 100 MG capsule TAKE 1 TO 3 CAPSULES BY  MOUTH AT BEDTIME AS  DIRECTED 270 capsule 1   levothyroxine (SYNTHROID) 75 MCG tablet TAKE 1 TABLET BY MOUTH  DAILY 90 tablet 3   Magnesium 500 MG TABS Take 2 tablets by mouth daily.     metFORMIN (GLUCOPHAGE-XR) 500 MG 24 hr tablet TAKE 3 TABLETS BY MOUTH  DAILY 270 tablet 1   pantoprazole (PROTONIX) 40 MG tablet Take 1 tablet (40 mg total) by mouth every morning. 90 tablet 3   pyridOXINE (VITAMIN B-6) 100 MG tablet Take 100 mg by mouth daily.     TURMERIC CURCUMIN PO Take 1 tablet by mouth daily.     valsartan-hydrochlorothiazide (DIOVAN-HCT) 160-25 MG tablet TAKE 1 TABLET BY MOUTH  DAILY 90 tablet 3   Facility-Administered Medications Prior to  Visit  Medication Dose Route Frequency Provider Last Rate Last Admin   betamethasone acetate-betamethasone sodium phosphate (CELESTONE) injection 3 mg  3 mg Intramuscular Once Gala Lewandowsky M, DPM       betamethasone acetate-betamethasone sodium phosphate (CELESTONE) injection 3 mg  3 mg Intramuscular Once Evans, Brent M, DPM         EXAM:  BP 122/79 (BP Location: Left Arm, Patient Position: Sitting, Cuff Size: Normal)    Pulse 73    Temp 97.8 F (36.6 C) (Oral)    Ht 5\' 1"  (1.549 m)    Wt 149 lb 12.8 oz (67.9 kg)    SpO2 98%    BMI 28.30 kg/m   Body mass index is 28.3 kg/m.  GENERAL: vitals reviewed and listed above, alert, oriented, appears well hydrated and in no acute distress HEENT: atraumatic, conjunctiva  clear, no obvious abnormalities on inspection of external nose and ears OP : Masked NECK: no obvious masses on inspection palpation  LUNGS: clear to auscultation bilaterally, no wheezes, rales or rhonchi, good air movement Abdomen soft without again a megaly guarding or rebound is mildly tender over the bladder area but no masses. CV: HRRR, no clubbing cyanosis or  peripheral edema nl cap refill  MS: moves all extremities without noticeable focal  abnormality PSYCH: pleasant and cooperative, no obvious depression or anxiety Lab Results  Component Value Date   WBC 8.3 03/09/2021   HGB 12.5 04/16/2021   HCT 37.6 03/09/2021   PLT 297.0 03/09/2021   GLUCOSE 122 (H) 03/09/2021   CHOL 138 03/09/2021   TRIG 92.0 03/09/2021   HDL 45.90 03/09/2021   LDLDIRECT 101.0 03/04/2019   LDLCALC 73 03/09/2021   ALT 21 03/09/2021   AST 23 03/09/2021   NA 143 03/09/2021   K 4.0 03/09/2021   CL 102 03/09/2021   CREATININE 1.11 03/09/2021   BUN 27 (H) 03/09/2021   CO2 32 03/09/2021   TSH 3.19 03/09/2021   INR 1.12 11/19/2009   HGBA1C 6.5 (A) 09/14/2021   MICROALBUR 34.8 (H) 03/09/2021   BP Readings from Last 3 Encounters:  09/14/21 122/79  08/16/21 (!) 170/56  04/16/21 (!) 142/80   Record review ED visit. Urinalysis    Component Value Date/Time   COLORURINE yellow  01/26/2010 0921   APPEARANCEUR Clear 01/26/2010 0921   LABSPEC 1.020 01/26/2010 0921   PHURINE 6.0 01/26/2010 0921   GLUCOSEU NEGATIVE 11/19/2009 1442   HGBUR negative 01/26/2010 0921   BILIRUBINUR Negative 09/14/2021 0942   KETONESUR NEGATIVE 11/19/2009 1442   PROTEINUR Positive (A) 09/14/2021 0942   PROTEINUR NEGATIVE 11/19/2009 1442   UROBILINOGEN 0.2 10/09/2015 0922   UROBILINOGEN 0.2 01/26/2010 0921   NITRITE pos 10/09/2015 0922   NITRITE negative 01/26/2010 0921   LEUKOCYTESUR Large (3+) (A) 09/14/2021 0942     ASSESSMENT AND PLAN:  Discussed the following assessment and plan:  Type 2 diabetes mellitus with hyperglycemia, without long-term current use of insulin (HCC) - Plan: POC HgB A1c, Flu Vaccine QUAD High Dose(Fluad)  Suspected UTI - Plan: POC Urinalysis Dipstick  Medication management  Essential hypertension  Personal history of COVID-19 A1c is stable improved continue Blood pressure improved today Acute cystitis consistent with urinalysis begin Macrobid twice a day for 5 to 7 days urine culture pending. And CPX with labs can call ahead for labs ahead of time if wishes so I can place the orders.  In 6 months. Plan urine protein at that time not done today because of UTI. -Patient advised to return or notify health care team  if  new concerns arise.  Patient Instructions  Good to see you today . Treat for UTI with antibiotic and will notify  of results.  A1c is stable good.  Lungs exam is normal today .   Plan  6 mos cpx and full lab panel .   Neta Mends.  M.D.

## 2021-09-14 ENCOUNTER — Encounter: Payer: Self-pay | Admitting: Internal Medicine

## 2021-09-14 ENCOUNTER — Ambulatory Visit (INDEPENDENT_AMBULATORY_CARE_PROVIDER_SITE_OTHER): Payer: Medicare Other | Admitting: Internal Medicine

## 2021-09-14 VITALS — BP 122/79 | HR 73 | Temp 97.8°F | Ht 61.0 in | Wt 149.8 lb

## 2021-09-14 DIAGNOSIS — Z8616 Personal history of COVID-19: Secondary | ICD-10-CM | POA: Diagnosis not present

## 2021-09-14 DIAGNOSIS — I10 Essential (primary) hypertension: Secondary | ICD-10-CM

## 2021-09-14 DIAGNOSIS — Z79899 Other long term (current) drug therapy: Secondary | ICD-10-CM

## 2021-09-14 DIAGNOSIS — R3989 Other symptoms and signs involving the genitourinary system: Secondary | ICD-10-CM | POA: Diagnosis not present

## 2021-09-14 DIAGNOSIS — E1165 Type 2 diabetes mellitus with hyperglycemia: Secondary | ICD-10-CM | POA: Diagnosis not present

## 2021-09-14 DIAGNOSIS — E039 Hypothyroidism, unspecified: Secondary | ICD-10-CM

## 2021-09-14 DIAGNOSIS — Z23 Encounter for immunization: Secondary | ICD-10-CM | POA: Diagnosis not present

## 2021-09-14 LAB — URINALYSIS, ROUTINE W REFLEX MICROSCOPIC
Bilirubin Urine: NEGATIVE
Ketones, ur: NEGATIVE
Nitrite: NEGATIVE
Specific Gravity, Urine: 1.01 (ref 1.000–1.030)
Total Protein, Urine: 30 — AB
Urine Glucose: NEGATIVE
Urobilinogen, UA: 0.2 (ref 0.0–1.0)
pH: 7 (ref 5.0–8.0)

## 2021-09-14 LAB — POCT URINALYSIS DIPSTICK
Bilirubin, UA: NEGATIVE
Glucose, UA: NEGATIVE
Ketones, UA: NEGATIVE
Protein, UA: POSITIVE — AB
Spec Grav, UA: 1.015 (ref 1.010–1.025)
pH, UA: 7 (ref 5.0–8.0)

## 2021-09-14 LAB — POCT GLYCOSYLATED HEMOGLOBIN (HGB A1C): Hemoglobin A1C: 6.5 % — AB (ref 4.0–5.6)

## 2021-09-14 MED ORDER — NITROFURANTOIN MONOHYD MACRO 100 MG PO CAPS: 100.0000 mg | ORAL_CAPSULE | Freq: Two times a day (BID) | ORAL | 0 refills | Status: AC

## 2021-09-14 NOTE — Patient Instructions (Addendum)
Good to see you today . Treat for UTI with antibiotic and will notify  of results.  A1c is stable good.  Lungs exam is normal today .   Plan  6 mos cpx and full lab panel .

## 2021-09-14 NOTE — Addendum Note (Signed)
Addended by: Mary Sella D on: 09/14/2021 12:46 PM   Modules accepted: Orders

## 2021-09-16 LAB — URINE CULTURE
MICRO NUMBER:: 12880503
SPECIMEN QUALITY:: ADEQUATE

## 2021-09-16 NOTE — Progress Notes (Signed)
Tell patient that urine culture shows e coli  sensitive to medication given . Should resolve with current treatment .FU if not better. 

## 2021-09-21 NOTE — Progress Notes (Signed)
Refill the macrobid  to her pharmacy . Thanks

## 2021-09-22 ENCOUNTER — Other Ambulatory Visit: Payer: Self-pay

## 2021-09-22 ENCOUNTER — Other Ambulatory Visit (HOSPITAL_BASED_OUTPATIENT_CLINIC_OR_DEPARTMENT_OTHER): Payer: Self-pay

## 2021-09-22 DIAGNOSIS — R3989 Other symptoms and signs involving the genitourinary system: Secondary | ICD-10-CM

## 2021-09-22 MED ORDER — NITROFURANTOIN MONOHYD MACRO 100 MG PO CAPS
100.0000 mg | ORAL_CAPSULE | Freq: Two times a day (BID) | ORAL | 0 refills | Status: AC
  Filled 2021-09-22: qty 14, 7d supply, fill #0

## 2021-09-26 ENCOUNTER — Other Ambulatory Visit: Payer: Self-pay | Admitting: Internal Medicine

## 2021-09-30 DIAGNOSIS — M9904 Segmental and somatic dysfunction of sacral region: Secondary | ICD-10-CM | POA: Diagnosis not present

## 2021-09-30 DIAGNOSIS — M9903 Segmental and somatic dysfunction of lumbar region: Secondary | ICD-10-CM | POA: Diagnosis not present

## 2021-09-30 DIAGNOSIS — M5136 Other intervertebral disc degeneration, lumbar region: Secondary | ICD-10-CM | POA: Diagnosis not present

## 2021-09-30 DIAGNOSIS — M9905 Segmental and somatic dysfunction of pelvic region: Secondary | ICD-10-CM | POA: Diagnosis not present

## 2021-10-05 ENCOUNTER — Other Ambulatory Visit (HOSPITAL_BASED_OUTPATIENT_CLINIC_OR_DEPARTMENT_OTHER): Payer: Self-pay

## 2021-10-05 DIAGNOSIS — M9904 Segmental and somatic dysfunction of sacral region: Secondary | ICD-10-CM | POA: Diagnosis not present

## 2021-10-05 DIAGNOSIS — M5136 Other intervertebral disc degeneration, lumbar region: Secondary | ICD-10-CM | POA: Diagnosis not present

## 2021-10-05 DIAGNOSIS — M9903 Segmental and somatic dysfunction of lumbar region: Secondary | ICD-10-CM | POA: Diagnosis not present

## 2021-10-05 DIAGNOSIS — M9905 Segmental and somatic dysfunction of pelvic region: Secondary | ICD-10-CM | POA: Diagnosis not present

## 2021-10-19 DIAGNOSIS — M5136 Other intervertebral disc degeneration, lumbar region: Secondary | ICD-10-CM | POA: Diagnosis not present

## 2021-10-19 DIAGNOSIS — M9904 Segmental and somatic dysfunction of sacral region: Secondary | ICD-10-CM | POA: Diagnosis not present

## 2021-10-19 DIAGNOSIS — M9903 Segmental and somatic dysfunction of lumbar region: Secondary | ICD-10-CM | POA: Diagnosis not present

## 2021-10-19 DIAGNOSIS — M9905 Segmental and somatic dysfunction of pelvic region: Secondary | ICD-10-CM | POA: Diagnosis not present

## 2021-10-25 ENCOUNTER — Encounter: Payer: Self-pay | Admitting: Gastroenterology

## 2021-10-26 DIAGNOSIS — M5136 Other intervertebral disc degeneration, lumbar region: Secondary | ICD-10-CM | POA: Diagnosis not present

## 2021-10-26 DIAGNOSIS — M9905 Segmental and somatic dysfunction of pelvic region: Secondary | ICD-10-CM | POA: Diagnosis not present

## 2021-10-26 DIAGNOSIS — M9903 Segmental and somatic dysfunction of lumbar region: Secondary | ICD-10-CM | POA: Diagnosis not present

## 2021-10-26 DIAGNOSIS — M9904 Segmental and somatic dysfunction of sacral region: Secondary | ICD-10-CM | POA: Diagnosis not present

## 2021-11-01 DIAGNOSIS — M9903 Segmental and somatic dysfunction of lumbar region: Secondary | ICD-10-CM | POA: Diagnosis not present

## 2021-11-01 DIAGNOSIS — M9904 Segmental and somatic dysfunction of sacral region: Secondary | ICD-10-CM | POA: Diagnosis not present

## 2021-11-01 DIAGNOSIS — M5136 Other intervertebral disc degeneration, lumbar region: Secondary | ICD-10-CM | POA: Diagnosis not present

## 2021-11-01 DIAGNOSIS — M9905 Segmental and somatic dysfunction of pelvic region: Secondary | ICD-10-CM | POA: Diagnosis not present

## 2021-11-04 ENCOUNTER — Other Ambulatory Visit (INDEPENDENT_AMBULATORY_CARE_PROVIDER_SITE_OTHER): Payer: Medicare Other

## 2021-11-04 DIAGNOSIS — D509 Iron deficiency anemia, unspecified: Secondary | ICD-10-CM | POA: Diagnosis not present

## 2021-11-04 LAB — IBC + FERRITIN
Ferritin: 30.6 ng/mL (ref 10.0–291.0)
Iron: 48 ug/dL (ref 42–145)
Saturation Ratios: 13.7 % — ABNORMAL LOW (ref 20.0–50.0)
TIBC: 350 ug/dL (ref 250.0–450.0)
Transferrin: 250 mg/dL (ref 212.0–360.0)

## 2021-11-08 DIAGNOSIS — M9903 Segmental and somatic dysfunction of lumbar region: Secondary | ICD-10-CM | POA: Diagnosis not present

## 2021-11-08 DIAGNOSIS — M5136 Other intervertebral disc degeneration, lumbar region: Secondary | ICD-10-CM | POA: Diagnosis not present

## 2021-11-08 DIAGNOSIS — M9905 Segmental and somatic dysfunction of pelvic region: Secondary | ICD-10-CM | POA: Diagnosis not present

## 2021-11-08 DIAGNOSIS — M9904 Segmental and somatic dysfunction of sacral region: Secondary | ICD-10-CM | POA: Diagnosis not present

## 2021-11-09 IMAGING — US US ABDOMEN COMPLETE
1 series · 14 of 25 positions shown · non-contrast
Comparison: CT 09/11/2014

CLINICAL DATA: Iron deficiency anemia.  Hiccups.  Reflux disease.

EXAM:
ABDOMEN ULTRASOUND COMPLETE

[Series 1: us abdomen complete · 14 of 136 slices shown]
[im 1/136]
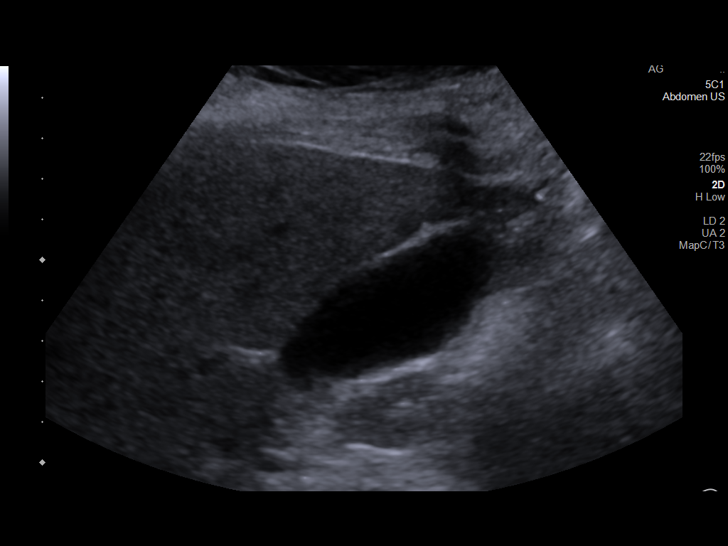
[im 12/136]
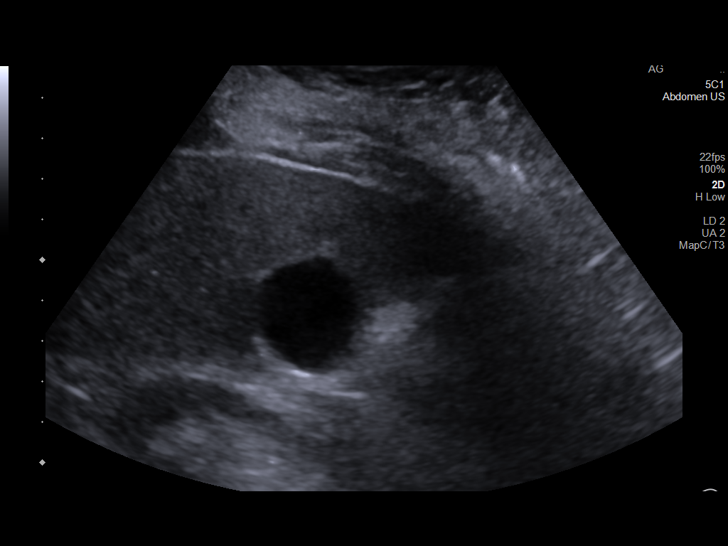
[im 23/136]
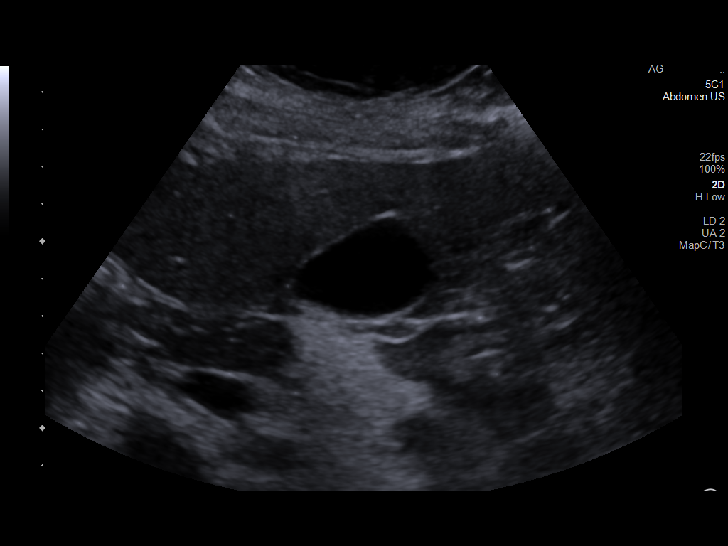
[im 34/136]
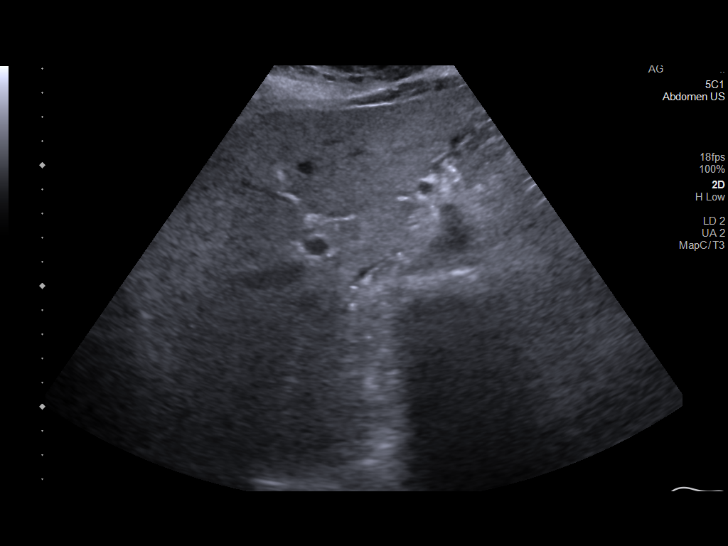
[im 46/136]
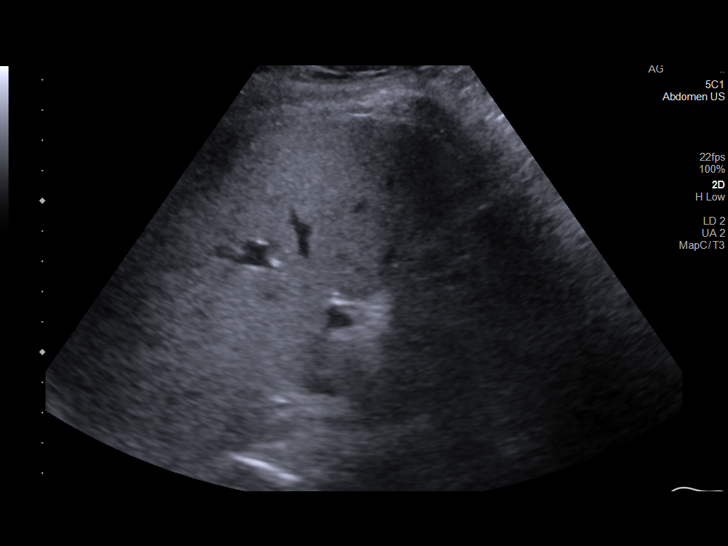
[im 51/136]
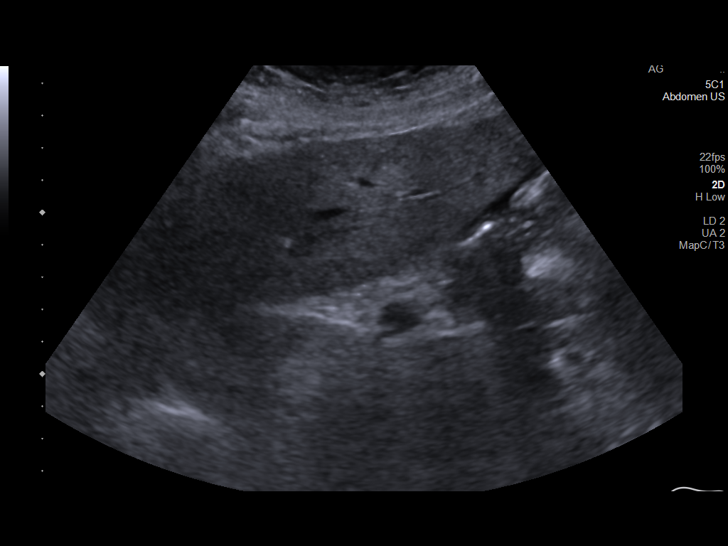
[im 62/136]
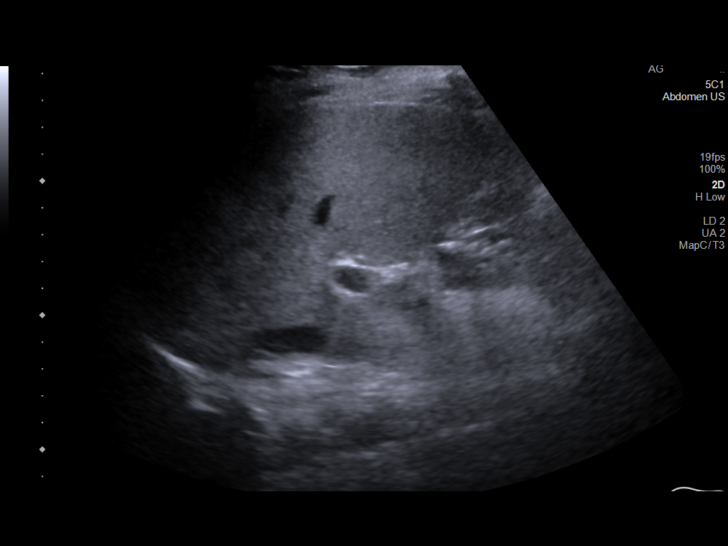
[im 74/136]
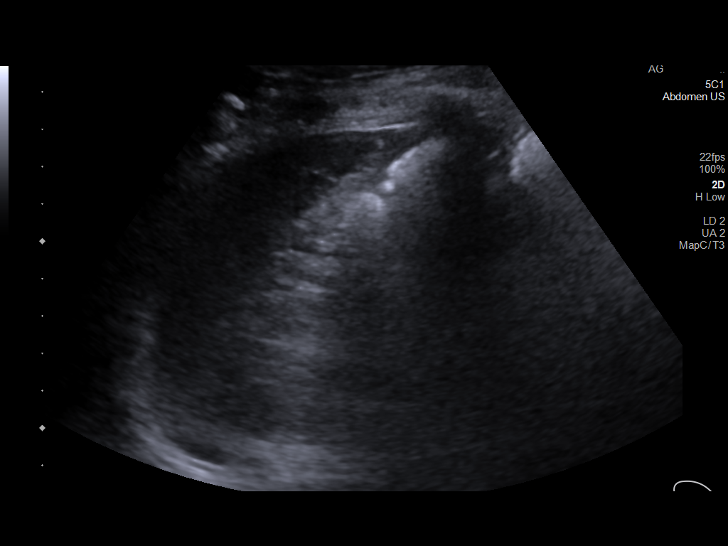
[im 85/136]
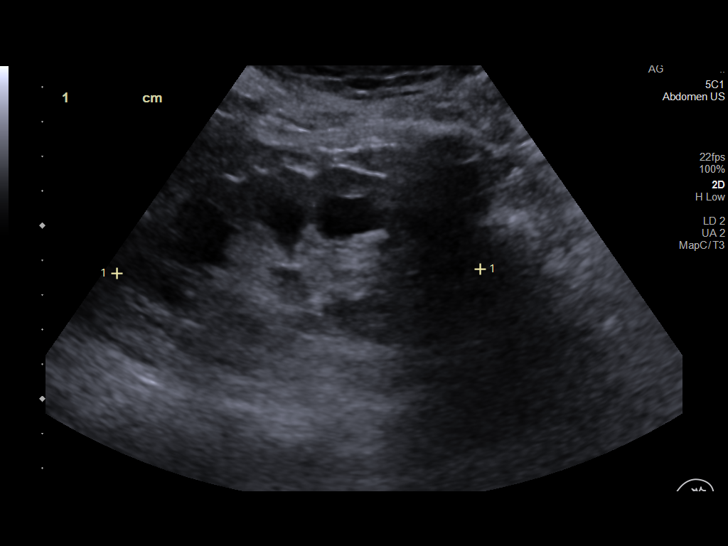
[im 91/136]
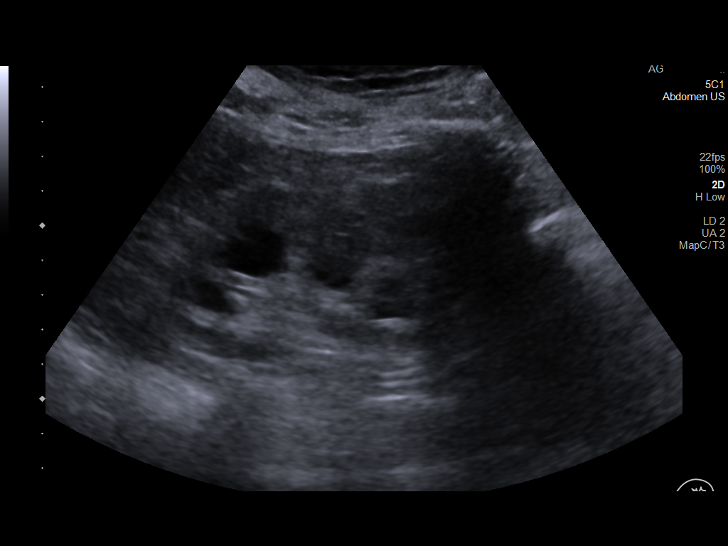
[im 102/136]
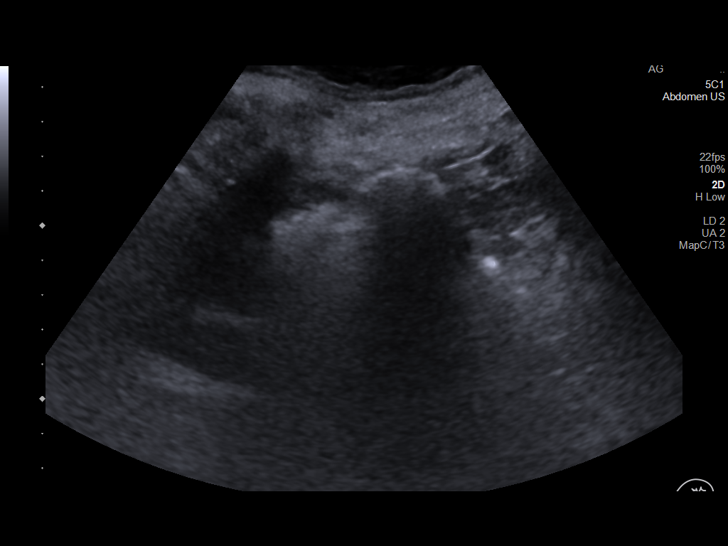
[im 113/136]
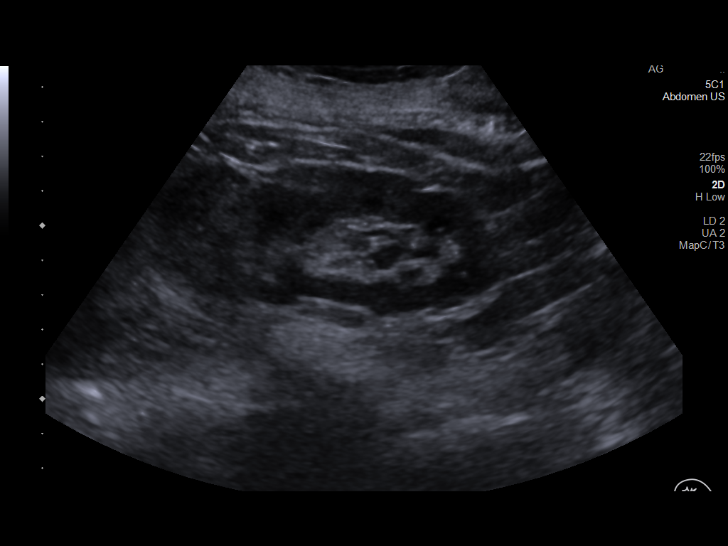
[im 124/136]
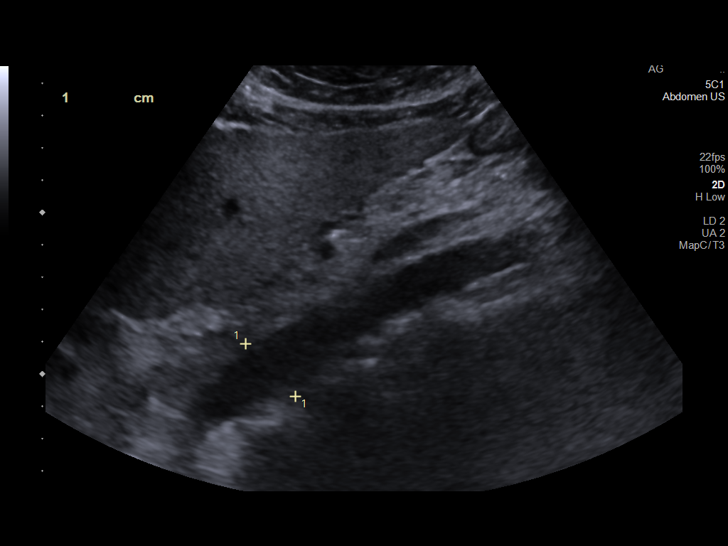
[im 136/136]
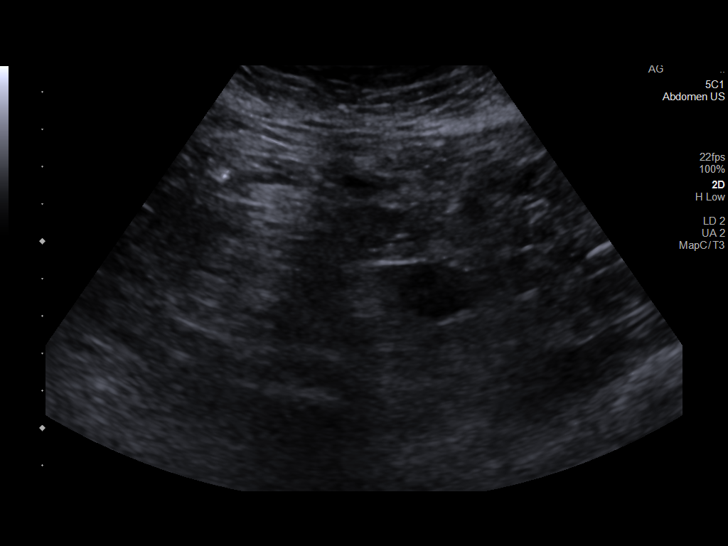

[14 of 25 positions shown; findings below may reference images not displayed]

FINDINGS: Gallbladder: No gallstones or wall thickening visualized. No
sonographic Murphy sign noted by sonographer.

Common bile duct: Diameter: 1.8 mm.  Normal.

Liver: Slightly increased echogenicity suggesting fatty change. No
focal lesion or ductal dilatation. Portal vein is patent on color
Doppler imaging with normal direction of blood flow towards the
liver.

IVC: No abnormality visualized.

Pancreas: Visualized portion unremarkable.

Spleen: Size and appearance within normal limits.

Right Kidney: Length: 10.5 cm.  2.4 cm cyst.  Otherwise normal.

Left Kidney: Length: 10.0 cm. Echogenicity within normal limits. No
mass or hydronephrosis visualized.

Abdominal aorta: No aneurysm visualized.

Other findings: None.
IMPRESSION: Mild increased echogenicity of the liver parenchyma suggesting mild
steatosis. No focal lesion. No gallbladder disease. No ductal
dilatation.

## 2021-11-11 DIAGNOSIS — M9905 Segmental and somatic dysfunction of pelvic region: Secondary | ICD-10-CM | POA: Diagnosis not present

## 2021-11-11 DIAGNOSIS — M9903 Segmental and somatic dysfunction of lumbar region: Secondary | ICD-10-CM | POA: Diagnosis not present

## 2021-11-11 DIAGNOSIS — M9904 Segmental and somatic dysfunction of sacral region: Secondary | ICD-10-CM | POA: Diagnosis not present

## 2021-11-11 DIAGNOSIS — M5136 Other intervertebral disc degeneration, lumbar region: Secondary | ICD-10-CM | POA: Diagnosis not present

## 2021-11-15 DIAGNOSIS — M9904 Segmental and somatic dysfunction of sacral region: Secondary | ICD-10-CM | POA: Diagnosis not present

## 2021-11-15 DIAGNOSIS — M9905 Segmental and somatic dysfunction of pelvic region: Secondary | ICD-10-CM | POA: Diagnosis not present

## 2021-11-15 DIAGNOSIS — M5136 Other intervertebral disc degeneration, lumbar region: Secondary | ICD-10-CM | POA: Diagnosis not present

## 2021-11-15 DIAGNOSIS — M9903 Segmental and somatic dysfunction of lumbar region: Secondary | ICD-10-CM | POA: Diagnosis not present

## 2021-11-17 ENCOUNTER — Ambulatory Visit: Payer: Medicare Other | Admitting: Gastroenterology

## 2021-11-17 VITALS — BP 150/84 | HR 70 | Ht 61.0 in | Wt 153.0 lb

## 2021-11-17 DIAGNOSIS — K219 Gastro-esophageal reflux disease without esophagitis: Secondary | ICD-10-CM | POA: Diagnosis not present

## 2021-11-17 DIAGNOSIS — D509 Iron deficiency anemia, unspecified: Secondary | ICD-10-CM

## 2021-11-17 DIAGNOSIS — R066 Hiccough: Secondary | ICD-10-CM | POA: Diagnosis not present

## 2021-11-17 NOTE — Patient Instructions (Addendum)
It was my pleasure to provide care to you today. Based on our discussion, I am providing you with my recommendations below: ? ?RECOMMENDATION(S):  ? ?We discussed many remedies that might help, although they are not proven including: ?- Drinking water quickly ?- Swallowing a teaspoon of dry granulated sugar ?- Gargling water ?- Holding your breath ?- Drinking from the opposite side of a glass ? ?IMAGING: ? ?You will be contacted by [facility] (Your caller ID will indicate phone # [phone]) within the next business 7-10 business days to schedule your Upper GI series. If you have not heard from them within 7-10 business days, please call [facility] at [phone] to follow up on the status of your appointment.   ? ?REFERRAL: ? ?A referral, your demographics, a copy of your insurance card and your records will be sent to [facility]. You will receive a call from their office regarding the date, time and location of your appointment. ? ? ?FOLLOW UP: ? ?I would like for you to follow up with me in as needed. Please call the office at [phone] to schedule your appointment. ? ?BMI: ? ?If you are age 78 or older, your body mass index should be between 23-30. Your Body mass index is 28.91 kg/m?[name] If this is out of the aforementioned range listed, please consider follow up with your Primary Care Provider. ? ?MY CHART: ? ?The [facility] providers would like to encourage you to use [facility] to communicate with providers for non-urgent requests or questions.  Due to long hold times on the telephone, sending your provider a message by [facility] may be a faster and more efficient way to get a response.  Please allow 48 business hours for a response.  Please remember that this is for non-urgent requests.  ? ?Thank you for trusting me with your gastrointestinal care!   ? ?Th[place]ton Park, MD, MPH ?

## 2021-11-17 NOTE — Progress Notes (Signed)
? ?Referring Provider: Madelin Headings, MD ?Primary Care Physician:  Madelin Headings, MD ? ?Chief complaint:  Persistent hiccups ? ? ?IMPRESSION:  ?Daily, intermittent Hiccups not explained by EGD or improved on PPI BID ?   - not changed by pantoprazole 40 mg BID or baclofen 5 TID ?? 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   - empiric dilation performed 11/24/20 ?Hiatal hernia ?Iron deficiency without anemia anemia ?Diverticulosis without history of diverticulitis ? ?Chronic hiccups without neurologic symptoms: No esophageal and gastric etiologies identified on EGD to explain her hiccups. Some improvement with PPI. No symptomatic relief with baclofen. Will proceed with UGI series. If negative, CT abd/pelvis with contrast. Discussed treatment options, but, given her age I've asked her to discuss benzos (like Valium) or chlorpromazine (starting at 25 mg orally three times daily) with Dr. Fabian Sharp. I have concerns with each due to her age  ?  ?Iron deficiency: Without anemia. No overt bleeding. 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 Discussed possible EGD at the hospital with attempt at resection with Dr. Meridee Score. She was reluctant to proceed with endoscopy and is hoping to avoid it all together. I've asked her to see hematology to consider IV iron, as I think she is having more symptoms than she appreciates. ? 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 Repeat EGD at the hospital with attempt at resection recommended. She will consider the procedure and call me back to schedule if she wishes to proceed.  ? ?Prominent esophageal veins on EGD: ? Early varices. Platelets normal. Abdominal ultrasound showed no evidence for cirrhosis or portal hypertension. Low threshold to consider elastography, particularly given her diabetes.  This would be another reason to consider contrasted  cross-sectional imaging.  ? ?History of esophageal stricture: Remains asymptomatic.  ? ?Discussed colon cancer screening. She continues to decline colonoscopy, despite iron deficiency.  ? ?Diverticulosis: High fiber diet recommended.  ? ? ? ?PLAN: ?- continue pantoprazole BID ?- UGI given the new onset of bile brash ?- Ask Dr. Fabian Sharp for input re: benzos (like Valium) or chlorpromazine (starting at 25 mg orally three times daily) to further treat her hiccups ?- Consider referral to [facility] if UGI is negative to further discuss hiccups ?- Referral to hematology for persistent anemia ?- Consider EGD with gastric polypectomies (she will consider and call if she would like to schedule) ?- Consider contrasted cross-sectional imaging ?- Continue close follow-up with Dr. Fabian Sharp ?- Follow-up as needed ? ?I spent over 30 minutes, including in depth chart review, independent review of results, communicating results with the patient directly, face-to-face time with the patient, coordinating care, and ordering studies and medications as appropriate, and documentation.  ? ?HPI: Kerry Walters is a 76 y.o. female initially referred by Dr. Fabian Sharp for hiccups, reflux, and iron deficiency. She was last seen 04/16/21. She has a history of childhood asthma, diabetes, hypertension, reflux, and kidney stones.  ? ?Developed near daily hiccups over a year ago. Had been occurring every time that she eats as well as episodes that occur without eating. No identified food triggers. Not associated with overeating. Rare carbonated beverages. Rare use of chewing gum. She does not smoke. No heartburn, dysphagia, dysphonia. Has a frequent cough that she can control if her head is elevated.  No concurrent headache or neurologic symptoms. She also wishes to avoid medications.  ? ?EGD 11/24/20 showed a benign distal esophageal stricture dilated  with a 16 to 18 mm TTS balloon, 3 prominent veins in the mid esophagus suggesting early varices, gastritis,  gastric erosions, multiple benign gastric polyps, a small hiatal hernia.  Antral biopsies showed reactive gastropathy and focal intestinal metaplasia.  Fundus and gastric body biopsy showed mild chronic gastritis.  There was no H. pylori.  Gastric polyps were hyperplastic polyps with eroded granulation tissue.  Esophageal biopsies were normal. ? ?Hiccups slightly improved on pantoprazole. Trial of baclofen 5 mg TID provided no relief. She denies the use of any NSAIDs. She has had no dysphagia before or after her endoscopy.  ? ?She feels like the hiccups are getting worse and are now associated with bile brash.  ? ?Has started oral iron supplements for a new diagnosis of iron deficiency without anemia. She does not want to take IV iron. She has a friend who has not "done well" with IV iron. Tolerating her oral iron supplements without side effects.  ? ?Labs 11/04/21: iron 48, ferritin 30.6,  ? ?Abdominal ultrasound 01/14/21 to evaluate for her possible esophageal varices showed echogenic liver and was otherwise normal. Spleen normal. ? ?Prior endoscopic history: ?- EGD 10/22/2003: Esophageal stricture dilated from 14-1017mm savary dilator, small hiatal hernia, and biopsy-proven reflux ?- Colonoscopy with Dr. Juanda ChanceBrodie 12/22/2004: diverticulosis in the left colon ?- Cologuard 08/31/16: negative ?- EGD ? ? ?Labs 09/07/20: iron 52, ferritin 14, hemoglobin 12.6, MCV 85.6, RDW 15.1, platelets 291 ?Labs 01/05/21: iron 54, ferritin 15.9, hemoglobin 12.2 ?Labs 03/09/21: hemoglobin 12.7, MCV 86.5, platelets 297, normal liver enzymes ? ?Prior abdominal imaging: ?- CT abd/pelvis with contrast 09/11/14 for abdominal pain showed diverticulosis, renal calculi with possible UPJ stricture, large hiatal hernia ?- Abdominal ultrasound 01/14/21 to evaluate for her possible esophageal varices showed echogenic liver and was otherwise normal. Spleen normal. ? ? ? ?Past Medical History:  ?Diagnosis Date  ? Allergy   ? Asthma due to environmental allergies    ? no inhaler  ? Diabetes mellitus without complication (HCC)   ? Diverticulosis of colon   ? GERD (gastroesophageal reflux disease)   ? History of esophageal dilatation   ? FOR STRICTURE IN 2005  ? History of hiatal hernia   ? History of hypercalcemia   ? SECONDARY TO HYPERPARATHYROID  ? History of hyperparathyroidism   ? PRIMARY--  S/P RIGHT PARATHYROIDECTOMY  ? History of kidney stones   ? History of recurrent UTIs   ? Hyperlipidemia   ? Hypertension   ? Hypothyroidism   ? Incomplete right bundle branch block   ? Nephrolithiasis   ? RIGHT   ? Nocturia   ? PONV (postoperative nausea and vomiting)   ? Pre-diabetes   ? Sciatica of right side   ? ? ?Past Surgical History:  ?Procedure Laterality Date  ? APPENDECTOMY  1974  ? CYSTOSCOPY WITH URETEROSCOPY AND STENT PLACEMENT Right 11/11/2014  ? Procedure: CYSTOSCOPY WITH URETEROSCOPY AND STENT PLACEMENT;  Surgeon: Crist FatBenjamin W Herrick, MD;  [facility];  Service: Urology;  Laterality: Right;  ? EXCISION URETHRAL CARUNCLE  1981  ? Open procedure  ? KNEE ARTHROSCOPY Left april 2010  ? REMOVAL RIP  1973  ? First Rib on Right side-- for impingement  ? RIGHT INFERIOR PARATHYROIDECTOMY, MINIMALLY INVASIVE  08-14-2008  ? TONSILLECTOMY  as child  ? TOTAL KNEE ARTHROPLASTY Left 11-17-2009  ? TUBAL LIGATION  1973  ? VAGINAL HYSTERECTOMY  1995  ? w/ Bilateral Salpingoophorectomy  ? ? ?Current Outpatient Medications  ?Medication Sig Dispense Refill  ?  atorvastatin (LIPITOR) 20 MG tablet TAKE 1 TABLET BY MOUTH IN  THE EVENING 90 tablet 3  ? benzonatate (TESSALON) 100 MG capsule Take 1 capsule (100 mg total) by mouth every 8 (eight) hours. 21 capsule 0  ? cholecalciferol (VITAMIN D3) 25 MCG (1000 UNIT) tablet Take 1,000 Units by mouth daily.    ? Coenzyme Q10 (COQ-10) 400 MG CAPS Take 1 capsule by mouth daily.    ? conjugated estrogens (PREMARIN) vaginal cream Place 1 Applicatorful vaginally as directed. Use 2-3 times weekly  prn 42.5 g 2  ? ferrous sulfate 325  (65 FE) MG tablet Take 1 tablet (325 mg total) by mouth every other day. 15 tablet 11  ? gabapentin (NEURONTIN) 100 MG capsule TAKE 1 TO 3 CAPSULES BY  MOUTH AT BEDTIME AS  DIRECTED 270 capsule 1  ? levothyr

## 2021-11-18 ENCOUNTER — Encounter: Payer: Self-pay | Admitting: Gastroenterology

## 2021-11-18 DIAGNOSIS — M9903 Segmental and somatic dysfunction of lumbar region: Secondary | ICD-10-CM | POA: Diagnosis not present

## 2021-11-18 DIAGNOSIS — M9905 Segmental and somatic dysfunction of pelvic region: Secondary | ICD-10-CM | POA: Diagnosis not present

## 2021-11-18 DIAGNOSIS — M5136 Other intervertebral disc degeneration, lumbar region: Secondary | ICD-10-CM | POA: Diagnosis not present

## 2021-11-18 DIAGNOSIS — M9904 Segmental and somatic dysfunction of sacral region: Secondary | ICD-10-CM | POA: Diagnosis not present

## 2021-11-22 DIAGNOSIS — M9905 Segmental and somatic dysfunction of pelvic region: Secondary | ICD-10-CM | POA: Diagnosis not present

## 2021-11-22 DIAGNOSIS — M9904 Segmental and somatic dysfunction of sacral region: Secondary | ICD-10-CM | POA: Diagnosis not present

## 2021-11-22 DIAGNOSIS — M9903 Segmental and somatic dysfunction of lumbar region: Secondary | ICD-10-CM | POA: Diagnosis not present

## 2021-11-22 DIAGNOSIS — M5136 Other intervertebral disc degeneration, lumbar region: Secondary | ICD-10-CM | POA: Diagnosis not present

## 2021-11-24 DIAGNOSIS — M5136 Other intervertebral disc degeneration, lumbar region: Secondary | ICD-10-CM | POA: Diagnosis not present

## 2021-11-24 DIAGNOSIS — M9903 Segmental and somatic dysfunction of lumbar region: Secondary | ICD-10-CM | POA: Diagnosis not present

## 2021-11-24 DIAGNOSIS — M9904 Segmental and somatic dysfunction of sacral region: Secondary | ICD-10-CM | POA: Diagnosis not present

## 2021-11-24 DIAGNOSIS — M9905 Segmental and somatic dysfunction of pelvic region: Secondary | ICD-10-CM | POA: Diagnosis not present

## 2021-11-29 DIAGNOSIS — M9904 Segmental and somatic dysfunction of sacral region: Secondary | ICD-10-CM | POA: Diagnosis not present

## 2021-11-29 DIAGNOSIS — M9903 Segmental and somatic dysfunction of lumbar region: Secondary | ICD-10-CM | POA: Diagnosis not present

## 2021-11-29 DIAGNOSIS — M5136 Other intervertebral disc degeneration, lumbar region: Secondary | ICD-10-CM | POA: Diagnosis not present

## 2021-11-29 DIAGNOSIS — M9905 Segmental and somatic dysfunction of pelvic region: Secondary | ICD-10-CM | POA: Diagnosis not present

## 2021-11-30 NOTE — Progress Notes (Signed)
? ? ?[facility] ?[address] ?[place], [place] [address] ? ? ?CLINIC:  ?Medical Oncology/Hematology ? ?CONSULT NOTE ? ?Patient Care Team: ?Panosh, Neta Mends, MD as PCP - General ?Valeria Batman, MD (Orthopedic Surgery) ?Cherlyn Roberts, MD (Dermatology) ?Ernesto Rutherford, MD (Ophthalmology) ?Su Grand, MD (Inactive) as Consulting Physician (Urology) ?Dr Leana Roe chiropractor  ?Verner Chol, [facility] as Pharmacist (Pharmacist) ? ?CHIEF COMPLAINTS/PURPOSE OF CONSULTATION:  ?Iron deficiency without anemia ? ?HISTORY OF PRESENTING ILLNESS:  ?Kerry Walters 76 y.o. female is here at the request of her gastroenterologist (Dr. Tressia Danas) for treatment of her iron deficiency. ? ?Per note by Dr. Orvan Falconer (11/17/2021), patient has ulcerated gastric hyperplastic polyps that are thought to be the likely source of iron deficiency.  Patient does not have any overt bleeding.  She has been recommended to have repeat EGD with resection of polyps, which she is taking some time to think about. ? ?Most recent iron panel (11/04/2021) showed ferritin 30 with iron saturation 13% and TIBC 350. ?She has not had a CBC since 03/09/2021, and at that time had Hgb 12.7. ? ?She denies any frank blood in her bowel movements, but has had some dark stool ever since she started taking the iron pill.  She is symptomatic with significant fatigue and restless legs.  She denies any pica, headaches, chest pain, dyspnea on exertion, palpitations, lightheadedness, or syncope.  She reports 40% energy with 70% appetite.  She reports that she is maintaining a stable weight. ? ?She eats a balanced diet with a variety of foods.  She has been taking over-the-counter iron supplement daily for 6 months, but without improvement in her iron.  She has never had IV iron or blood transfusions in the past.  She does not donate blood.  She does not take any blood thinners, but takes daily pantoprazole.  She denies any family history of anemia or other  blood conditions.  Her father had brain tumor.  Mother deceased from unknown primary cancer with liver metastases.  Sister had unspecified cancer. ? ? ?MEDICAL HISTORY:  ?Past Medical History:  ?Diagnosis Date  ? Allergy   ? Asthma due to environmental allergies   ? no inhaler  ? Diabetes mellitus without complication (HCC)   ? Diverticulosis of colon   ? GERD (gastroesophageal reflux disease)   ? History of esophageal dilatation   ? FOR STRICTURE IN 2005  ? History of hiatal hernia   ? History of hypercalcemia   ? SECONDARY TO HYPERPARATHYROID  ? History of hyperparathyroidism   ? PRIMARY--  S/P RIGHT PARATHYROIDECTOMY  ? History of kidney stones   ? History of recurrent UTIs   ? Hyperlipidemia   ? Hypertension   ? Hypothyroidism   ? Incomplete right bundle branch block   ? Nephrolithiasis   ? RIGHT   ? Nocturia   ? PONV (postoperative nausea and vomiting)   ? Pre-diabetes   ? Sciatica of right side   ? ? ?SURGICAL HISTORY: ?Past Surgical History:  ?Procedure Laterality Date  ? APPENDECTOMY  1974  ? CYSTOSCOPY WITH URETEROSCOPY AND STENT PLACEMENT Right 11/11/2014  ? Procedure: CYSTOSCOPY WITH URETEROSCOPY AND STENT PLACEMENT;  Surgeon: Crist Fat, MD;  Location: [facility];  Service: Urology;  Laterality: Right;  ? EXCISION URETHRAL CARUNCLE  1981  ? Open procedure  ? KNEE ARTHROSCOPY Left april 2010  ? REMOVAL RIP  1973  ? First Rib on Right side-- for impingement  ? RIGHT  INFERIOR PARATHYROIDECTOMY, MINIMALLY INVASIVE  08-14-2008  ? TONSILLECTOMY  as child  ? TOTAL KNEE ARTHROPLASTY Left 11-17-2009  ? TUBAL LIGATION  1973  ? VAGINAL HYSTERECTOMY  1995  ? w/ Bilateral Salpingoophorectomy  ? ? ?SOCIAL HISTORY: ?Social History  ? ?Socioeconomic History  ? Marital status: Married  ?  Spouse name: Not on file  ? Number of children: Not on file  ? Years of education: Not on file  ? Highest education level: Not on file  ?Occupational History  ? Not on file  ?Tobacco Use  ? Smoking status: Never   ? Smokeless tobacco: Never  ?Vaping Use  ? Vaping Use: Never used  ?Substance and Sexual Activity  ? Alcohol use: No  ? Drug use: No  ? Sexual activity: Not on file  ?Other Topics Concern  ? Not on file  ?Social History Narrative  ? Married  ? hh of 2   4 dogs  ? G2 P2  ? Water exercise.  ? Retired   ? [place]  ?  great grandchild   ?   ?   ? ?Social Determinants of Health  ? ?Financial Resource Strain: Not on file  ?Food Insecurity: Not on file  ?Transportation Needs: Not on file  ?Physical Activity: Not on file  ?Stress: Not on file  ?Social Connections: Not on file  ?Intimate Partner Violence: Not on file  ? ? ?FAMILY HISTORY: ?Family History  ?Problem Relation Age of Onset  ? Cancer Father   ?     brain  ? Cancer Son   ?     liver  ? Diabetes Sister   ? Schizophrenia Son   ? Colon cancer Neg Hx   ? Colon polyps Neg Hx   ? Esophageal cancer Neg Hx   ? Stomach cancer Neg Hx   ? Pancreatic cancer Neg Hx   ? Rectal cancer Neg Hx   ? ? ?ALLERGIES:  is allergic to contrast media [iodinated contrast media], codeine, shrimp [shellfish allergy], and topamax [topiramate]. ? ?MEDICATIONS:  ?Current Outpatient Medications  ?Medication Sig Dispense Refill  ? atorvastatin (LIPITOR) 20 MG tablet TAKE 1 TABLET BY MOUTH IN  THE EVENING 90 tablet 3  ? benzonatate (TESSALON) 100 MG capsule Take 1 capsule (100 mg total) by mouth every 8 (eight) hours. 21 capsule 0  ? cholecalciferol (VITAMIN D3) 25 MCG (1000 UNIT) tablet Take 1,000 Units by mouth daily.    ? Coenzyme Q10 (COQ-10) 400 MG CAPS Take 1 capsule by mouth daily.    ? conjugated estrogens (PREMARIN) vaginal cream Place 1 Applicatorful vaginally as directed. Use 2-3 times weekly  prn 42.5 g 2  ? ferrous sulfate 325 (65 FE) MG tablet Take 1 tablet (325 mg total) by mouth every other day. 15 tablet 11  ? gabapentin (NEURONTIN) 100 MG capsule TAKE 1 TO 3 CAPSULES BY  MOUTH AT BEDTIME AS  DIRECTED 270 capsule 1  ? levothyroxine (SYNTHROID) 75 MCG tablet TAKE 1 TABLET BY MOUTH   DAILY 90 tablet 3  ? Magnesium 500 MG TABS Take 2 tablets by mouth daily.    ? metFORMIN (GLUCOPHAGE-XR) 500 MG 24 hr tablet TAKE 3 TABLETS BY MOUTH  DAILY 270 tablet 3  ? pantoprazole (PROTONIX) 40 MG tablet Take 1 tablet (40 mg total) by mouth every morning. 90 tablet 3  ? pyridOXINE (VITAMIN B-6) 100 MG tablet Take 100 mg by mouth daily.    ? TURMERIC CURCUMIN PO Take 1 tablet by mouth daily.    ?  valsartan-hydrochlorothiazide (DIOVAN-HCT) 160-25 MG tablet TAKE 1 TABLET BY MOUTH  DAILY 90 tablet 3  ? ?Current Facility-Administered Medications  ?Medication Dose Route Frequency Provider Last Rate Last Admin  ? betamethasone acetate-betamethasone sodium phosphate (CELESTONE) injection 3 mg  3 mg Intramuscular Once Gala LewandowskyEvans, Brent M, DPM      ? betamethasone acetate-betamethasone sodium phosphate (CELESTONE) injection 3 mg  3 mg Intramuscular Once Felecia ShellingEvans, Brent M, DPM      ? ? ?REVIEW OF SYSTEMS:   ?Review of Systems  ?Constitutional:  Positive for fatigue. Negative for appetite change, chills, diaphoresis, fever and unexpected weight change.  ?HENT:   Negative for lump/mass and nosebleeds.   ?Eyes:  Negative for eye problems.  ?Respiratory:  Negative for cough, hemoptysis and shortness of breath.   ?Cardiovascular:  Negative for chest pain, leg swelling and palpitations.  ?Gastrointestinal:  Negative for abdominal pain, blood in stool, constipation, diarrhea, nausea and vomiting.  ?Genitourinary:  Positive for bladder incontinence. Negative for hematuria.   ?Skin: Negative.   ?Neurological:  Negative for dizziness, headaches and light-headedness.  ?Hematological:  Does not bruise/bleed easily.  ?Psychiatric/Behavioral:  Positive for sleep disturbance.    ? ? ?PHYSICAL EXAMINATION: ?ECOG PERFORMANCE STATUS: 1 - Symptomatic but completely ambulatory ? ?There were no vitals filed for this visit. ?There were no vitals filed for this visit. ? ?Physical Exam ?Constitutional:   ?   Appearance: Normal appearance.  ?HENT:  ?    Head: Normocephalic and atraumatic.  ?   Mouth/Throat:  ?   Mouth: Mucous membranes are moist.  ?Eyes:  ?   Extraocular Movements: Extraocular movements intact.  ?   Pupils: Pupils are equal, round, and

## 2021-12-01 ENCOUNTER — Encounter (HOSPITAL_COMMUNITY): Payer: Self-pay | Admitting: Hematology

## 2021-12-01 ENCOUNTER — Inpatient Hospital Stay (HOSPITAL_COMMUNITY): Payer: Medicare Other

## 2021-12-01 ENCOUNTER — Inpatient Hospital Stay (HOSPITAL_COMMUNITY): Payer: Medicare Other | Attending: Hematology | Admitting: Hematology

## 2021-12-01 VITALS — BP 191/68 | HR 73 | Temp 97.7°F | Resp 18 | Ht 61.61 in | Wt 152.3 lb

## 2021-12-01 DIAGNOSIS — Z8639 Personal history of other endocrine, nutritional and metabolic disease: Secondary | ICD-10-CM | POA: Diagnosis not present

## 2021-12-01 DIAGNOSIS — M9905 Segmental and somatic dysfunction of pelvic region: Secondary | ICD-10-CM | POA: Diagnosis not present

## 2021-12-01 DIAGNOSIS — Z800 Family history of malignant neoplasm of digestive organs: Secondary | ICD-10-CM | POA: Diagnosis not present

## 2021-12-01 DIAGNOSIS — M9903 Segmental and somatic dysfunction of lumbar region: Secondary | ICD-10-CM | POA: Diagnosis not present

## 2021-12-01 DIAGNOSIS — I10 Essential (primary) hypertension: Secondary | ICD-10-CM | POA: Diagnosis not present

## 2021-12-01 DIAGNOSIS — Z8719 Personal history of other diseases of the digestive system: Secondary | ICD-10-CM | POA: Diagnosis not present

## 2021-12-01 DIAGNOSIS — K219 Gastro-esophageal reflux disease without esophagitis: Secondary | ICD-10-CM | POA: Diagnosis not present

## 2021-12-01 DIAGNOSIS — K921 Melena: Secondary | ICD-10-CM | POA: Diagnosis not present

## 2021-12-01 DIAGNOSIS — E039 Hypothyroidism, unspecified: Secondary | ICD-10-CM | POA: Insufficient documentation

## 2021-12-01 DIAGNOSIS — D500 Iron deficiency anemia secondary to blood loss (chronic): Secondary | ICD-10-CM

## 2021-12-01 DIAGNOSIS — M9904 Segmental and somatic dysfunction of sacral region: Secondary | ICD-10-CM | POA: Diagnosis not present

## 2021-12-01 DIAGNOSIS — E611 Iron deficiency: Secondary | ICD-10-CM | POA: Insufficient documentation

## 2021-12-01 DIAGNOSIS — Z885 Allergy status to narcotic agent status: Secondary | ICD-10-CM | POA: Diagnosis not present

## 2021-12-01 DIAGNOSIS — Z87440 Personal history of urinary (tract) infections: Secondary | ICD-10-CM | POA: Diagnosis not present

## 2021-12-01 DIAGNOSIS — Z87442 Personal history of urinary calculi: Secondary | ICD-10-CM | POA: Diagnosis not present

## 2021-12-01 DIAGNOSIS — M5136 Other intervertebral disc degeneration, lumbar region: Secondary | ICD-10-CM | POA: Diagnosis not present

## 2021-12-01 DIAGNOSIS — G2581 Restless legs syndrome: Secondary | ICD-10-CM | POA: Diagnosis not present

## 2021-12-01 DIAGNOSIS — Z79899 Other long term (current) drug therapy: Secondary | ICD-10-CM | POA: Diagnosis not present

## 2021-12-01 DIAGNOSIS — Z833 Family history of diabetes mellitus: Secondary | ICD-10-CM | POA: Insufficient documentation

## 2021-12-01 DIAGNOSIS — Z9049 Acquired absence of other specified parts of digestive tract: Secondary | ICD-10-CM | POA: Diagnosis not present

## 2021-12-01 DIAGNOSIS — Z818 Family history of other mental and behavioral disorders: Secondary | ICD-10-CM | POA: Diagnosis not present

## 2021-12-01 DIAGNOSIS — R5383 Other fatigue: Secondary | ICD-10-CM | POA: Insufficient documentation

## 2021-12-01 DIAGNOSIS — Z90722 Acquired absence of ovaries, bilateral: Secondary | ICD-10-CM | POA: Insufficient documentation

## 2021-12-01 DIAGNOSIS — Z808 Family history of malignant neoplasm of other organs or systems: Secondary | ICD-10-CM | POA: Diagnosis not present

## 2021-12-01 HISTORY — DX: Iron deficiency anemia secondary to blood loss (chronic): D50.0

## 2021-12-01 LAB — CBC WITH DIFFERENTIAL/PLATELET
Abs Immature Granulocytes: 0.04 10*3/uL (ref 0.00–0.07)
Basophils Absolute: 0.1 10*3/uL (ref 0.0–0.1)
Basophils Relative: 1 %
Eosinophils Absolute: 0.3 10*3/uL (ref 0.0–0.5)
Eosinophils Relative: 3 %
HCT: 37.3 % (ref 36.0–46.0)
Hemoglobin: 12 g/dL (ref 12.0–15.0)
Immature Granulocytes: 0 %
Lymphocytes Relative: 29 %
Lymphs Abs: 2.9 10*3/uL (ref 0.7–4.0)
MCH: 28.6 pg (ref 26.0–34.0)
MCHC: 32.2 g/dL (ref 30.0–36.0)
MCV: 88.8 fL (ref 80.0–100.0)
Monocytes Absolute: 0.8 10*3/uL (ref 0.1–1.0)
Monocytes Relative: 8 %
Neutro Abs: 6.1 10*3/uL (ref 1.7–7.7)
Neutrophils Relative %: 59 %
Platelets: 279 10*3/uL (ref 150–400)
RBC: 4.2 MIL/uL (ref 3.87–5.11)
RDW: 13.5 % (ref 11.5–15.5)
WBC: 10.3 10*3/uL (ref 4.0–10.5)
nRBC: 0 % (ref 0.0–0.2)

## 2021-12-01 NOTE — Patient Instructions (Signed)
[facility] ?Discharge Instructions ? ?You were seen today by Dr. Ellin Saba & Rojelio Brenner PA-C for your iron deficiency anemia.  As we discussed, this is most likely related to your gastric polyps, and you should continue to follow-up with your GI provider (Dr. Orvan Falconer) for treatment of this condition. ? ?For your low iron, we will give you IV iron supplementation x2 doses.  There is risk of allergic reaction, so we will watch you closely and give medications (including steroids) to help decrease your risk of allergic reaction. ? ?LABS: ?- Labs today to check your blood count. ?- Repeat labs in 2 months ? ?FOLLOW-UP APPOINTMENT: Office visit in 2 months ? ? ?Thank you for choosing  [facility] at [facility] to provide your oncology and hematology care.  To afford each patient quality time with our provider, please arrive at least 15 minutes before your scheduled appointment time.  ? ?If you have a lab appointment with the Cancer Center please come in thru the Main Entrance and check in at the main information desk. ? ?You need to re-schedule your appointment should you arrive 10 or more minutes late.  We strive to give you quality time with our providers, and arriving late affects you and other patients whose appointments are after yours.  Also, if you no show three or more times for appointments you may be dismissed from the clinic at the providers discretion.     ?Again, thank you for choosing [facility].  Our hope is that these requests will decrease the amount of time that you wait before being seen by our physicians.       ?_____________________________________________________________ ? ?Should you have questions after your visit to [facility], please contact our office at [phone] and follow the prompts.  Our office hours are 8:00 a.m. and 4:30 p.m. Monday - Friday.  Please note that voicemails left after 4:00  p.m. may not be returned until the following business day.  We are closed weekends and major holidays.  You do have access to a nurse 24-7, just call the main number to the clinic [phone] and do not press any options, hold on the line and a nurse will answer the phone.   ? ?For prescription refill requests, have your pharmacy contact our office and allow 72 hours.   ? ?Due to Covid, you will need to wear a mask upon entering the hospital. If you do not have a mask, a mask will be given to you at the Main Entrance upon arrival. For doctor visits, patients may have 1 support person age 57 or older with them. For treatment visits, patients can not have anyone with them due to social distancing guidelines and our immunocompromised population.  ? ? ? ?

## 2021-12-06 ENCOUNTER — Encounter (HOSPITAL_COMMUNITY): Payer: Self-pay

## 2021-12-06 ENCOUNTER — Inpatient Hospital Stay (HOSPITAL_COMMUNITY): Payer: Medicare Other

## 2021-12-06 VITALS — BP 166/73 | HR 64 | Temp 97.9°F | Resp 18

## 2021-12-06 DIAGNOSIS — D500 Iron deficiency anemia secondary to blood loss (chronic): Secondary | ICD-10-CM

## 2021-12-06 DIAGNOSIS — K921 Melena: Secondary | ICD-10-CM | POA: Diagnosis not present

## 2021-12-06 DIAGNOSIS — Z8719 Personal history of other diseases of the digestive system: Secondary | ICD-10-CM | POA: Diagnosis not present

## 2021-12-06 DIAGNOSIS — G2581 Restless legs syndrome: Secondary | ICD-10-CM | POA: Diagnosis not present

## 2021-12-06 DIAGNOSIS — I10 Essential (primary) hypertension: Secondary | ICD-10-CM | POA: Diagnosis not present

## 2021-12-06 DIAGNOSIS — Z885 Allergy status to narcotic agent status: Secondary | ICD-10-CM | POA: Diagnosis not present

## 2021-12-06 DIAGNOSIS — Z8639 Personal history of other endocrine, nutritional and metabolic disease: Secondary | ICD-10-CM | POA: Diagnosis not present

## 2021-12-06 DIAGNOSIS — R5383 Other fatigue: Secondary | ICD-10-CM | POA: Diagnosis not present

## 2021-12-06 DIAGNOSIS — Z87440 Personal history of urinary (tract) infections: Secondary | ICD-10-CM | POA: Diagnosis not present

## 2021-12-06 DIAGNOSIS — Z9049 Acquired absence of other specified parts of digestive tract: Secondary | ICD-10-CM | POA: Diagnosis not present

## 2021-12-06 DIAGNOSIS — Z87442 Personal history of urinary calculi: Secondary | ICD-10-CM | POA: Diagnosis not present

## 2021-12-06 DIAGNOSIS — E039 Hypothyroidism, unspecified: Secondary | ICD-10-CM | POA: Diagnosis not present

## 2021-12-06 DIAGNOSIS — Z808 Family history of malignant neoplasm of other organs or systems: Secondary | ICD-10-CM | POA: Diagnosis not present

## 2021-12-06 DIAGNOSIS — E611 Iron deficiency: Secondary | ICD-10-CM | POA: Diagnosis not present

## 2021-12-06 DIAGNOSIS — K219 Gastro-esophageal reflux disease without esophagitis: Secondary | ICD-10-CM | POA: Diagnosis not present

## 2021-12-06 DIAGNOSIS — Z833 Family history of diabetes mellitus: Secondary | ICD-10-CM | POA: Diagnosis not present

## 2021-12-06 DIAGNOSIS — Z800 Family history of malignant neoplasm of digestive organs: Secondary | ICD-10-CM | POA: Diagnosis not present

## 2021-12-06 DIAGNOSIS — Z79899 Other long term (current) drug therapy: Secondary | ICD-10-CM | POA: Diagnosis not present

## 2021-12-06 MED ORDER — SODIUM CHLORIDE 0.9 % IV SOLN: Freq: Once | INTRAVENOUS | Status: AC

## 2021-12-06 MED ORDER — SODIUM CHLORIDE 0.9 % IV SOLN
510.0000 mg | Freq: Once | INTRAVENOUS | Status: AC
  Administered 2021-12-06: 510 mg via INTRAVENOUS
  Filled 2021-12-06: qty 17

## 2021-12-06 MED ORDER — METHYLPREDNISOLONE SODIUM SUCC 125 MG IJ SOLR
125.0000 mg | Freq: Once | INTRAMUSCULAR | Status: AC
  Administered 2021-12-06: 125 mg via INTRAVENOUS
  Filled 2021-12-06: qty 2

## 2021-12-06 MED ORDER — FAMOTIDINE IN NACL 20-0.9 MG/50ML-% IV SOLN
20.0000 mg | Freq: Once | INTRAVENOUS | Status: AC
  Administered 2021-12-06: 20 mg via INTRAVENOUS
  Filled 2021-12-06: qty 50

## 2021-12-06 NOTE — Progress Notes (Signed)
Patient presents today for Feraheme infusion Patient has no complaints today. MAR reviewed and updated. Patient denies pain today.  ? ?Feraheme given today per MD orders. Tolerated infusion without adverse affects. Vital signs stable. No complaints at this time. Discharged from clinic ambulatory in stable condition. Alert and oriented x 3. F/U with [facility] as scheduled.   ?

## 2021-12-06 NOTE — Patient Instructions (Signed)
[facility]  Discharge Instructions: °Thank you for choosing [facility] to provide your oncology and hematology care.  °If you have a lab appointment with the Cancer Center, please come in thru the Main Entrance and check in at the main information desk. ° °Wear comfortable clothing and clothing appropriate for easy access to any Portacath or PICC line.  ° °We strive to give you quality time with your provider. You may need to reschedule your appointment if you arrive late (15 or more minutes).  Arriving late affects you and other patients whose appointments are after yours.  Also, if you miss three or more appointments without notifying the office, you may be dismissed from the clinic at the provider’s discretion.    °  °For prescription refill requests, have your pharmacy contact our office and allow 72 hours for refills to be completed.   ° °Today you received the following chemotherapy and/or immunotherapy agents Feraheme    °  °To help prevent nausea and vomiting after your treatment, we encourage you to take your nausea medication as directed. ° °BELOW ARE SYMPTOMS THAT SHOULD BE REPORTED IMMEDIATELY: °*FEVER GREATER THAN 100.4 F (38 °C) OR HIGHER °*CHILLS OR SWEATING °*NAUSEA AND VOMITING THAT IS NOT CONTROLLED WITH YOUR NAUSEA MEDICATION °*UNUSUAL SHORTNESS OF BREATH °*UNUSUAL BRUISING OR BLEEDING °*URINARY PROBLEMS (pain or burning when urinating, or frequent urination) °*BOWEL PROBLEMS (unusual diarrhea, constipation, pain near the anus) °TENDERNESS IN MOUTH AND THROAT WITH OR WITHOUT PRESENCE OF ULCERS (sore throat, sores in mouth, or a toothache) °UNUSUAL RASH, SWELLING OR PAIN  °UNUSUAL VAGINAL DISCHARGE OR ITCHING  ° °Items with * indicate a potential emergency and should be followed up as soon as possible or go to the Emergency Department if any problems should occur. ° °Please show the CHEMOTHERAPY ALERT CARD or IMMUNOTHERAPY ALERT CARD at check-in to the Emergency  Department and triage nurse. ° °Should you have questions after your visit or need to cancel or reschedule your appointment, please contact [facility] [phone]  and follow the prompts.  Office hours are 8:00 a.m. to 4:30 p.m. Monday - Friday. Please note that voicemails left after 4:00 p.m. may not be returned until the following business day.  We are closed weekends and major holidays. You have access to a nurse at all times for urgent questions. Please call the main number to the clinic [phone] and follow the prompts. ° °For any non-urgent questions, you may also contact your provider using MyChart. We now offer e-Visits for anyone 18 and older to request care online for non-urgent symptoms. For details visit [email or web address]. °  °Also download the MyChart app! Go to the app store, search "MyChart", open the app, select [facility], and log in with your MyChart username and password. ° °Due to Covid, a mask is required upon entering the hospital/clinic. If you do not have a mask, one will be given to you upon arrival. For doctor visits, patients may have 1 support person aged 18 or older with them. For treatment visits, patients cannot have anyone with them due to current Covid guidelines and our immunocompromised population.  °

## 2021-12-08 DIAGNOSIS — M9904 Segmental and somatic dysfunction of sacral region: Secondary | ICD-10-CM | POA: Diagnosis not present

## 2021-12-08 DIAGNOSIS — M5136 Other intervertebral disc degeneration, lumbar region: Secondary | ICD-10-CM | POA: Diagnosis not present

## 2021-12-08 DIAGNOSIS — M9903 Segmental and somatic dysfunction of lumbar region: Secondary | ICD-10-CM | POA: Diagnosis not present

## 2021-12-08 DIAGNOSIS — M9905 Segmental and somatic dysfunction of pelvic region: Secondary | ICD-10-CM | POA: Diagnosis not present

## 2021-12-13 ENCOUNTER — Inpatient Hospital Stay (HOSPITAL_COMMUNITY): Payer: Medicare Other

## 2021-12-13 VITALS — BP 159/80 | HR 66 | Temp 96.7°F | Resp 18

## 2021-12-13 DIAGNOSIS — R5383 Other fatigue: Secondary | ICD-10-CM | POA: Diagnosis not present

## 2021-12-13 DIAGNOSIS — E611 Iron deficiency: Secondary | ICD-10-CM | POA: Diagnosis not present

## 2021-12-13 DIAGNOSIS — Z885 Allergy status to narcotic agent status: Secondary | ICD-10-CM | POA: Diagnosis not present

## 2021-12-13 DIAGNOSIS — I10 Essential (primary) hypertension: Secondary | ICD-10-CM | POA: Diagnosis not present

## 2021-12-13 DIAGNOSIS — Z8719 Personal history of other diseases of the digestive system: Secondary | ICD-10-CM | POA: Diagnosis not present

## 2021-12-13 DIAGNOSIS — Z833 Family history of diabetes mellitus: Secondary | ICD-10-CM | POA: Diagnosis not present

## 2021-12-13 DIAGNOSIS — Z87442 Personal history of urinary calculi: Secondary | ICD-10-CM | POA: Diagnosis not present

## 2021-12-13 DIAGNOSIS — Z8639 Personal history of other endocrine, nutritional and metabolic disease: Secondary | ICD-10-CM | POA: Diagnosis not present

## 2021-12-13 DIAGNOSIS — D500 Iron deficiency anemia secondary to blood loss (chronic): Secondary | ICD-10-CM

## 2021-12-13 DIAGNOSIS — Z87440 Personal history of urinary (tract) infections: Secondary | ICD-10-CM | POA: Diagnosis not present

## 2021-12-13 DIAGNOSIS — E039 Hypothyroidism, unspecified: Secondary | ICD-10-CM | POA: Diagnosis not present

## 2021-12-13 DIAGNOSIS — Z79899 Other long term (current) drug therapy: Secondary | ICD-10-CM | POA: Diagnosis not present

## 2021-12-13 DIAGNOSIS — K219 Gastro-esophageal reflux disease without esophagitis: Secondary | ICD-10-CM | POA: Diagnosis not present

## 2021-12-13 DIAGNOSIS — Z808 Family history of malignant neoplasm of other organs or systems: Secondary | ICD-10-CM | POA: Diagnosis not present

## 2021-12-13 DIAGNOSIS — K921 Melena: Secondary | ICD-10-CM | POA: Diagnosis not present

## 2021-12-13 DIAGNOSIS — Z9049 Acquired absence of other specified parts of digestive tract: Secondary | ICD-10-CM | POA: Diagnosis not present

## 2021-12-13 DIAGNOSIS — Z800 Family history of malignant neoplasm of digestive organs: Secondary | ICD-10-CM | POA: Diagnosis not present

## 2021-12-13 DIAGNOSIS — G2581 Restless legs syndrome: Secondary | ICD-10-CM | POA: Diagnosis not present

## 2021-12-13 MED ORDER — METHYLPREDNISOLONE SODIUM SUCC 125 MG IJ SOLR
125.0000 mg | Freq: Once | INTRAMUSCULAR | Status: AC
  Administered 2021-12-13: 125 mg via INTRAVENOUS
  Filled 2021-12-13: qty 2

## 2021-12-13 MED ORDER — FAMOTIDINE IN NACL 20-0.9 MG/50ML-% IV SOLN
20.0000 mg | Freq: Once | INTRAVENOUS | Status: AC
  Administered 2021-12-13: 20 mg via INTRAVENOUS
  Filled 2021-12-13: qty 50

## 2021-12-13 MED ORDER — SODIUM CHLORIDE 0.9 % IV SOLN: Freq: Once | INTRAVENOUS | Status: AC

## 2021-12-13 MED ORDER — SODIUM CHLORIDE 0.9 % IV SOLN
510.0000 mg | Freq: Once | INTRAVENOUS | Status: AC
  Administered 2021-12-13: 510 mg via INTRAVENOUS
  Filled 2021-12-13: qty 510

## 2021-12-13 MED ORDER — ACETAMINOPHEN 325 MG PO TABS: 650.0000 mg | ORAL_TABLET | Freq: Once | ORAL | Status: DC

## 2021-12-13 MED ORDER — LORATADINE 10 MG PO TABS: 10.0000 mg | ORAL_TABLET | Freq: Once | ORAL | Status: DC

## 2021-12-13 NOTE — Patient Instructions (Signed)
[facility]  Discharge Instructions: °Thank you for choosing [facility] to provide your oncology and hematology care.  °If you have a lab appointment with the Cancer Center, please come in thru the Main Entrance and check in at the main information desk. ° °Wear comfortable clothing and clothing appropriate for easy access to any Portacath or PICC line.  ° °We strive to give you quality time with your provider. You may need to reschedule your appointment if you arrive late (15 or more minutes).  Arriving late affects you and other patients whose appointments are after yours.  Also, if you miss three or more appointments without notifying the office, you may be dismissed from the clinic at the provider’s discretion.    °  °For prescription refill requests, have your pharmacy contact our office and allow 72 hours for refills to be completed.   ° °Today you received the following chemotherapy and/or immunotherapy agents Feraheme    °  °To help prevent nausea and vomiting after your treatment, we encourage you to take your nausea medication as directed. ° °BELOW ARE SYMPTOMS THAT SHOULD BE REPORTED IMMEDIATELY: °*FEVER GREATER THAN 100.4 F (38 °C) OR HIGHER °*CHILLS OR SWEATING °*NAUSEA AND VOMITING THAT IS NOT CONTROLLED WITH YOUR NAUSEA MEDICATION °*UNUSUAL SHORTNESS OF BREATH °*UNUSUAL BRUISING OR BLEEDING °*URINARY PROBLEMS (pain or burning when urinating, or frequent urination) °*BOWEL PROBLEMS (unusual diarrhea, constipation, pain near the anus) °TENDERNESS IN MOUTH AND THROAT WITH OR WITHOUT PRESENCE OF ULCERS (sore throat, sores in mouth, or a toothache) °UNUSUAL RASH, SWELLING OR PAIN  °UNUSUAL VAGINAL DISCHARGE OR ITCHING  ° °Items with * indicate a potential emergency and should be followed up as soon as possible or go to the Emergency Department if any problems should occur. ° °Please show the CHEMOTHERAPY ALERT CARD or IMMUNOTHERAPY ALERT CARD at check-in to the Emergency  Department and triage nurse. ° °Should you have questions after your visit or need to cancel or reschedule your appointment, please contact [facility] [phone]  and follow the prompts.  Office hours are 8:00 a.m. to 4:30 p.m. Monday - Friday. Please note that voicemails left after 4:00 p.m. may not be returned until the following business day.  We are closed weekends and major holidays. You have access to a nurse at all times for urgent questions. Please call the main number to the clinic [phone] and follow the prompts. ° °For any non-urgent questions, you may also contact your provider using MyChart. We now offer e-Visits for anyone 18 and older to request care online for non-urgent symptoms. For details visit [email or web address]. °  °Also download the MyChart app! Go to the app store, search "MyChart", open the app, select , and log in with your MyChart username and password. ° °Due to Covid, a mask is required upon entering the hospital/clinic. If you do not have a mask, one will be given to you upon arrival. For doctor visits, patients may have 1 support person aged 18 or older with them. For treatment visits, patients cannot have anyone with them due to current Covid guidelines and our immunocompromised population.  °

## 2021-12-13 NOTE — Progress Notes (Signed)
Patient presents today for Feraheme infusion per providers order.  Vital signs WNL.  Patient has no new complaints at this time. ? ?Peripheral IV started and blood return noted pre and post infusion. ? ?Feraheme infusion given today per MD orders.  Stable during infusion without adverse affects.  Vital signs stable.  Patient refused to wait the post infusion wait time.  No complaints at this time.  Discharge from clinic ambulatory in stable condition.  Alert and oriented X 3.  Follow up with [facility] as scheduled.  ?

## 2021-12-14 DIAGNOSIS — M5136 Other intervertebral disc degeneration, lumbar region: Secondary | ICD-10-CM | POA: Diagnosis not present

## 2021-12-14 DIAGNOSIS — M9904 Segmental and somatic dysfunction of sacral region: Secondary | ICD-10-CM | POA: Diagnosis not present

## 2021-12-14 DIAGNOSIS — M9905 Segmental and somatic dysfunction of pelvic region: Secondary | ICD-10-CM | POA: Diagnosis not present

## 2021-12-14 DIAGNOSIS — M9903 Segmental and somatic dysfunction of lumbar region: Secondary | ICD-10-CM | POA: Diagnosis not present

## 2021-12-22 DIAGNOSIS — M9904 Segmental and somatic dysfunction of sacral region: Secondary | ICD-10-CM | POA: Diagnosis not present

## 2021-12-22 DIAGNOSIS — M5136 Other intervertebral disc degeneration, lumbar region: Secondary | ICD-10-CM | POA: Diagnosis not present

## 2021-12-22 DIAGNOSIS — M9903 Segmental and somatic dysfunction of lumbar region: Secondary | ICD-10-CM | POA: Diagnosis not present

## 2021-12-22 DIAGNOSIS — M9905 Segmental and somatic dysfunction of pelvic region: Secondary | ICD-10-CM | POA: Diagnosis not present

## 2021-12-29 DIAGNOSIS — M9904 Segmental and somatic dysfunction of sacral region: Secondary | ICD-10-CM | POA: Diagnosis not present

## 2021-12-29 DIAGNOSIS — M9905 Segmental and somatic dysfunction of pelvic region: Secondary | ICD-10-CM | POA: Diagnosis not present

## 2021-12-29 DIAGNOSIS — M9903 Segmental and somatic dysfunction of lumbar region: Secondary | ICD-10-CM | POA: Diagnosis not present

## 2021-12-29 DIAGNOSIS — M5136 Other intervertebral disc degeneration, lumbar region: Secondary | ICD-10-CM | POA: Diagnosis not present

## 2022-01-05 DIAGNOSIS — M5136 Other intervertebral disc degeneration, lumbar region: Secondary | ICD-10-CM | POA: Diagnosis not present

## 2022-01-05 DIAGNOSIS — M9905 Segmental and somatic dysfunction of pelvic region: Secondary | ICD-10-CM | POA: Diagnosis not present

## 2022-01-05 DIAGNOSIS — M9903 Segmental and somatic dysfunction of lumbar region: Secondary | ICD-10-CM | POA: Diagnosis not present

## 2022-01-05 DIAGNOSIS — M9904 Segmental and somatic dysfunction of sacral region: Secondary | ICD-10-CM | POA: Diagnosis not present

## 2022-01-12 DIAGNOSIS — M5136 Other intervertebral disc degeneration, lumbar region: Secondary | ICD-10-CM | POA: Diagnosis not present

## 2022-01-12 DIAGNOSIS — M9905 Segmental and somatic dysfunction of pelvic region: Secondary | ICD-10-CM | POA: Diagnosis not present

## 2022-01-12 DIAGNOSIS — M9903 Segmental and somatic dysfunction of lumbar region: Secondary | ICD-10-CM | POA: Diagnosis not present

## 2022-01-12 DIAGNOSIS — M9904 Segmental and somatic dysfunction of sacral region: Secondary | ICD-10-CM | POA: Diagnosis not present

## 2022-01-13 ENCOUNTER — Other Ambulatory Visit: Payer: Self-pay | Admitting: Internal Medicine

## 2022-01-13 DIAGNOSIS — H401131 Primary open-angle glaucoma, bilateral, mild stage: Secondary | ICD-10-CM | POA: Diagnosis not present

## 2022-01-19 DIAGNOSIS — M9903 Segmental and somatic dysfunction of lumbar region: Secondary | ICD-10-CM | POA: Diagnosis not present

## 2022-01-19 DIAGNOSIS — M9904 Segmental and somatic dysfunction of sacral region: Secondary | ICD-10-CM | POA: Diagnosis not present

## 2022-01-19 DIAGNOSIS — M5136 Other intervertebral disc degeneration, lumbar region: Secondary | ICD-10-CM | POA: Diagnosis not present

## 2022-01-19 DIAGNOSIS — M9905 Segmental and somatic dysfunction of pelvic region: Secondary | ICD-10-CM | POA: Diagnosis not present

## 2022-01-25 ENCOUNTER — Inpatient Hospital Stay (HOSPITAL_COMMUNITY): Payer: Medicare Other | Attending: Hematology

## 2022-01-25 DIAGNOSIS — G479 Sleep disorder, unspecified: Secondary | ICD-10-CM | POA: Insufficient documentation

## 2022-01-25 DIAGNOSIS — R5383 Other fatigue: Secondary | ICD-10-CM | POA: Insufficient documentation

## 2022-01-25 DIAGNOSIS — Z833 Family history of diabetes mellitus: Secondary | ICD-10-CM | POA: Insufficient documentation

## 2022-01-25 DIAGNOSIS — Z818 Family history of other mental and behavioral disorders: Secondary | ICD-10-CM | POA: Insufficient documentation

## 2022-01-25 DIAGNOSIS — J45909 Unspecified asthma, uncomplicated: Secondary | ICD-10-CM | POA: Insufficient documentation

## 2022-01-25 DIAGNOSIS — D500 Iron deficiency anemia secondary to blood loss (chronic): Secondary | ICD-10-CM

## 2022-01-25 DIAGNOSIS — R350 Frequency of micturition: Secondary | ICD-10-CM | POA: Diagnosis not present

## 2022-01-25 DIAGNOSIS — I10 Essential (primary) hypertension: Secondary | ICD-10-CM | POA: Diagnosis not present

## 2022-01-25 DIAGNOSIS — Z800 Family history of malignant neoplasm of digestive organs: Secondary | ICD-10-CM | POA: Insufficient documentation

## 2022-01-25 DIAGNOSIS — Z808 Family history of malignant neoplasm of other organs or systems: Secondary | ICD-10-CM | POA: Diagnosis not present

## 2022-01-25 DIAGNOSIS — E611 Iron deficiency: Secondary | ICD-10-CM | POA: Diagnosis not present

## 2022-01-25 DIAGNOSIS — E119 Type 2 diabetes mellitus without complications: Secondary | ICD-10-CM | POA: Diagnosis not present

## 2022-01-25 DIAGNOSIS — Z79899 Other long term (current) drug therapy: Secondary | ICD-10-CM | POA: Insufficient documentation

## 2022-01-25 DIAGNOSIS — E538 Deficiency of other specified B group vitamins: Secondary | ICD-10-CM | POA: Insufficient documentation

## 2022-01-25 LAB — IRON AND TIBC
Iron: 80 ug/dL (ref 28–170)
Saturation Ratios: 27 % (ref 10.4–31.8)
TIBC: 297 ug/dL (ref 250–450)
UIBC: 217 ug/dL

## 2022-01-25 LAB — CBC WITH DIFFERENTIAL/PLATELET
Abs Immature Granulocytes: 0.04 10*3/uL (ref 0.00–0.07)
Basophils Absolute: 0.1 10*3/uL (ref 0.0–0.1)
Basophils Relative: 1 %
Eosinophils Absolute: 0.3 10*3/uL (ref 0.0–0.5)
Eosinophils Relative: 3 %
HCT: 38.9 % (ref 36.0–46.0)
Hemoglobin: 13.1 g/dL (ref 12.0–15.0)
Immature Granulocytes: 0 %
Lymphocytes Relative: 28 %
Lymphs Abs: 2.6 10*3/uL (ref 0.7–4.0)
MCH: 30.3 pg (ref 26.0–34.0)
MCHC: 33.7 g/dL (ref 30.0–36.0)
MCV: 89.8 fL (ref 80.0–100.0)
Monocytes Absolute: 0.6 10*3/uL (ref 0.1–1.0)
Monocytes Relative: 7 %
Neutro Abs: 5.7 10*3/uL (ref 1.7–7.7)
Neutrophils Relative %: 61 %
Platelets: 261 10*3/uL (ref 150–400)
RBC: 4.33 MIL/uL (ref 3.87–5.11)
RDW: 14.3 % (ref 11.5–15.5)
WBC: 9.3 10*3/uL (ref 4.0–10.5)
nRBC: 0 % (ref 0.0–0.2)

## 2022-01-25 LAB — FERRITIN: Ferritin: 438 ng/mL — ABNORMAL HIGH (ref 11–307)

## 2022-01-25 LAB — VITAMIN B12: Vitamin B-12: 4469 pg/mL — ABNORMAL HIGH (ref 180–914)

## 2022-01-25 LAB — FOLATE: Folate: 6.3 ng/mL (ref >5.9)

## 2022-01-26 DIAGNOSIS — M9903 Segmental and somatic dysfunction of lumbar region: Secondary | ICD-10-CM | POA: Diagnosis not present

## 2022-01-26 DIAGNOSIS — M9905 Segmental and somatic dysfunction of pelvic region: Secondary | ICD-10-CM | POA: Diagnosis not present

## 2022-01-26 DIAGNOSIS — M9904 Segmental and somatic dysfunction of sacral region: Secondary | ICD-10-CM | POA: Diagnosis not present

## 2022-01-26 DIAGNOSIS — M5136 Other intervertebral disc degeneration, lumbar region: Secondary | ICD-10-CM | POA: Diagnosis not present

## 2022-01-26 LAB — HOMOCYSTEINE: Homocysteine: 14.3 umol/L (ref 0.0–19.2)

## 2022-01-27 LAB — COPPER, SERUM: Copper: 132 ug/dL (ref 80–158)

## 2022-01-28 LAB — METHYLMALONIC ACID, SERUM: Methylmalonic Acid, Quantitative: 212 nmol/L (ref 0–378)

## 2022-01-29 NOTE — Progress Notes (Signed)
Kerry Walters, [place] [id number]   CLINIC:  Medical Oncology/Hematology  PCP:  Burnis Medin, MD [address] [place] [address] [phone]   REASON FOR VISIT:  Follow-up for iron deficiency  PRIOR THERAPY: Oral iron tablets  CURRENT THERAPY:  IV iron as needed  INTERVAL HISTORY:  Kerry Walters 76 y.o. female returns for routine follow-up of her iron deficiency state without anemia.  She was seen for initial consultation by Dr. Delton Coombes in Tarri Abernethy PA-C on [phone].  She received IV Feraheme x2 on 12/06/2021 and 12/13/2021.  At today's visit, she reports feeling fairly well.  No recent hospitalizations, surgeries, or changes in baseline health status.  She does not note any improvement in her fatigue after receiving IV iron.  She continues to have "good days and bad days" with fatigue that waxes and wanes.  She continues to have chronic restless legs, for which she takes gabapentin.  She denies any pica, chest pain, dyspnea on exertion, or syncope. She continues to deny any prior blood per rectum, melena, epistaxis, or other source of blood loss.   She has 65% energy and 80% appetite. She endorses that she is maintaining a stable weight.   REVIEW OF SYSTEMS:  Review of Systems  Constitutional:  Positive for fatigue. Negative for appetite change, chills, diaphoresis, fever and unexpected weight change.  HENT:   Negative for lump/mass and nosebleeds.   Eyes:  Negative for eye problems.  Respiratory:  Negative for cough, hemoptysis and shortness of breath.   Cardiovascular:  Negative for chest pain, leg swelling and palpitations.  Gastrointestinal:  Negative for abdominal pain, blood in stool, constipation, diarrhea, nausea and vomiting.  Genitourinary:  Positive for frequency. Negative for hematuria.   Skin: Negative.   Neurological:  Negative for dizziness, headaches and light-headedness.  Hematological:  Does not bruise/bleed  easily.  Psychiatric/Behavioral:  Positive for sleep disturbance.      PAST MEDICAL/SURGICAL HISTORY:  Past Medical History:  Diagnosis Date   Allergy    Asthma due to environmental allergies    no inhaler   Diabetes mellitus without complication (Bertie)    Diverticulosis of colon    GERD (gastroesophageal reflux disease)    History of esophageal dilatation    FOR STRICTURE IN 2005   History of hiatal hernia    History of hypercalcemia    SECONDARY TO HYPERPARATHYROID   History of hyperparathyroidism    PRIMARY--  S/P RIGHT PARATHYROIDECTOMY   History of kidney stones    History of recurrent UTIs    Hyperlipidemia    Hypertension    Hypothyroidism    Incomplete right bundle branch block    Iron deficiency anemia due to chronic blood loss 12/01/2021   Nephrolithiasis    RIGHT    Nocturia    PONV (postoperative nausea and vomiting)    Pre-diabetes    Sciatica of right side    Past Surgical History:  Procedure Laterality Date   APPENDECTOMY  1974   CYSTOSCOPY WITH URETEROSCOPY AND STENT PLACEMENT Right 11/11/2014   Procedure: CYSTOSCOPY WITH URETEROSCOPY AND STENT PLACEMENT;  Surgeon: Ardis Hughs, MD;  Location: [facility];  Service: Urology;  Laterality: Right;   EXCISION URETHRAL CARUNCLE  1981   Open procedure   KNEE ARTHROSCOPY Left april 2010   REMOVAL RIP  1973   First Rib on Right side-- for impingement   RIGHT INFERIOR PARATHYROIDECTOMY, MINIMALLY INVASIVE  08-14-2008   TONSILLECTOMY  as child   TOTAL KNEE ARTHROPLASTY Left 11-17-2009   TUBAL LIGATION  1973   VAGINAL HYSTERECTOMY  1995   w/ Bilateral Salpingoophorectomy     SOCIAL HISTORY:  Social History   Socioeconomic History   Marital status: Married    Spouse name: Not on file   Number of children: Not on file   Years of education: Not on file   Highest education level: Not on file  Occupational History   Not on file  Tobacco Use   Smoking status: Never   Smokeless tobacco:  Never  Vaping Use   Vaping Use: Never used  Substance and Sexual Activity   Alcohol use: No   Drug use: No   Sexual activity: Not on file  Other Topics Concern   Not on file  Social History Narrative   Married   hh of 2   4 dogs   G2 P2   Water exercise.   Retired    [employer]    great grandchild          Social Determinants of Radio broadcast assistant Strain: Not on [employer] Insecurity: Not on file  Transportation Needs: Not on file  Physical Activity: Not on file  Stress: Not on file  Social Connections: Not on file  Intimate Partner Violence: Not on file    FAMILY HISTORY:  Family History  Problem Relation Age of Onset   Cancer Father        brain   Cancer Son        liver   Diabetes Sister    Schizophrenia Son    Colon cancer Neg Hx    Colon polyps Neg Hx    Esophageal cancer Neg Hx    Stomach cancer Neg Hx    Pancreatic cancer Neg Hx    Rectal cancer Neg Hx     CURRENT MEDICATIONS:  Outpatient Encounter Medications as of 01/31/2022  Medication Sig Note   atorvastatin (LIPITOR) 20 MG tablet TAKE 1 TABLET BY MOUTH IN  THE EVENING    benzonatate (TESSALON) 100 MG capsule Take 1 capsule (100 mg total) by mouth every 8 (eight) hours.    cholecalciferol (VITAMIN D3) 25 MCG (1000 UNIT) tablet Take 1,000 Units by mouth daily.    Coenzyme Q10 (COQ-10) 400 MG CAPS Take 1 capsule by mouth daily.    conjugated estrogens (PREMARIN) vaginal cream Place 1 Applicatorful vaginally as directed. Use 2-3 times weekly  prn    ferrous sulfate 325 (65 FE) MG tablet Take 1 tablet (325 mg total) by mouth every other day.    gabapentin (NEURONTIN) 100 MG capsule TAKE 1 TO 3 CAPSULES BY  MOUTH AT BEDTIME AS  DIRECTED    latanoprost (XALATAN) 0.005 % ophthalmic solution SMARTSIG:In Eye(s)    levothyroxine (SYNTHROID) 75 MCG tablet TAKE 1 TABLET BY MOUTH  DAILY    Magnesium 500 MG TABS Take 2 tablets by mouth daily.    metFORMIN (GLUCOPHAGE-XR) 500 MG 24 hr tablet TAKE 3 TABLETS  BY MOUTH  DAILY    pantoprazole (PROTONIX) 40 MG tablet Take 1 tablet (40 mg total) by mouth every morning.    pyridOXINE (VITAMIN B-6) 100 MG tablet Take 100 mg by mouth daily.    TURMERIC CURCUMIN PO Take 1 tablet by mouth daily. 03/24/2020: 450/50 mg   valsartan-hydrochlorothiazide (DIOVAN-HCT) 160-25 MG tablet TAKE 1 TABLET BY MOUTH  DAILY    Facility-Administered Encounter Medications as of 01/31/2022  Medication   betamethasone  acetate-betamethasone sodium phosphate (CELESTONE) injection 3 mg   betamethasone acetate-betamethasone sodium phosphate (CELESTONE) injection 3 mg    ALLERGIES:  Allergies  Allergen Reactions   Contrast Media [Iodinated Contrast Media] Shortness Of Breath    Pt states in the 80's at Urologist visit had asthma attack reaction to IVP dye. Told not to have it again.    Codeine Nausea Only   Shrimp [Shellfish Allergy] Swelling    Only Shrimp   Topamax [Topiramate] Rash     PHYSICAL EXAM:  ECOG PERFORMANCE STATUS: 1 - Symptomatic but completely ambulatory  There were no vitals filed for this visit. There were no vitals filed for this visit. Physical Exam Constitutional:      Appearance: Normal appearance.  HENT:     Head: Normocephalic and atraumatic.     Mouth/Throat:     Mouth: Mucous membranes are moist.  Eyes:     Extraocular Movements: Extraocular movements intact.     Pupils: Pupils are equal, round, and reactive to light.  Cardiovascular:     Rate and Rhythm: Normal rate and regular rhythm.     Pulses: Normal pulses.     Heart sounds: Normal heart sounds.  Pulmonary:     Effort: Pulmonary effort is normal.     Breath sounds: Normal breath sounds.  Abdominal:     General: Bowel sounds are normal.     Palpations: Abdomen is soft.     Tenderness: There is no abdominal tenderness.  Musculoskeletal:        General: No swelling.     Right lower leg: No edema.     Left lower leg: No edema.  Lymphadenopathy:     Cervical: No cervical  adenopathy.  Skin:    General: Skin is warm and dry.  Neurological:     General: No focal deficit present.     Mental Status: She is alert and oriented to person, place, and time.  Psychiatric:        Mood and Affect: Mood normal.        Behavior: Behavior normal.     LABORATORY DATA:  I have reviewed the labs as listed.  CBC    Component Value Date/Time   WBC 9.3 01/25/2022 1045   RBC 4.33 01/25/2022 1045   HGB 13.1 01/25/2022 1045   HCT 38.9 01/25/2022 1045   PLT 261 01/25/2022 1045   MCV 89.8 01/25/2022 1045   MCH 30.3 01/25/2022 1045   MCHC 33.7 01/25/2022 1045   RDW 14.3 01/25/2022 1045   LYMPHSABS 2.6 01/25/2022 1045   MONOABS 0.6 01/25/2022 1045   EOSABS 0.3 01/25/2022 1045   BASOSABS 0.1 01/25/2022 1045      Latest Ref Rng & Units 03/09/2021   10:13 AM 09/07/2020   11:55 AM 03/03/2020    9:26 AM  CMP  Glucose 70 - 99 mg/dL 122   94   127    BUN 6 - 23 mg/dL 27   19   23     Creatinine 0.40 - 1.20 mg/dL 1.11   0.93   1.02    Sodium 135 - 145 mEq/L 143   142   144    Potassium 3.5 - 5.1 mEq/L 4.0   4.0   3.8    Chloride 96 - 112 mEq/L 102   104   102    CO2 19 - 32 mEq/L 32   33   32    Calcium 8.4 - 10.5 mg/dL 10.8   10.2  10.0    Total Protein 6.0 - 8.3 g/dL 6.8   6.6   6.7    Total Bilirubin 0.2 - 1.2 mg/dL 0.4   0.4   0.5    Alkaline Phos 39 - 117 U/L 67   73   77    AST 0 - 37 U/L 23   23   23     ALT 0 - 35 U/L 21   24   20       DIAGNOSTIC IMAGING:  I have independently reviewed the relevant imaging and discussed with the patient.  ASSESSMENT & PLAN: 1.  Iron deficiency without anemia - Seen at the request of her gastroenterologist (Dr. Thornton Park) for treatment of her iron deficiency. -- Per note by Dr. Tarri Glenn (11/17/2021), patient has ulcerated gastric hyperplastic polyps that are thought to be the likely source of iron deficiency.  Patient does not have any overt bleeding.  She has been recommended to have repeat EGD with resection of polyps,  which she is taking some time to think about. -- Iron panel (11/04/2021) showed ferritin 30 with iron saturation 13% and TIBC 350. -- She has not had a CBC since 03/09/2021, and at that time had Hgb 12.7. - CBC (12/01/2021): Hgb 12.0/MCV 88.8 - No history of blood transfusion - Denies any bright red blood per rectum or melena   - Took daily ferrous sulfate for the 6 months without improvement - IV Feraheme x2 on 12/06/2021 and 12/13/2021.  Premeds given due to multiple medication allergies. - Symptomatic with fatigue and restless legs  - Most recent labs (01/25/2022): Hgb 13.1/MCV 89.8, ferritin 438, iron saturation 27%. - Additional labs (01/25/2022): Elevated vitamin B12 4469 with MMA normal.  Normal folate, homocystine, copper. --Differential diagnosis favors iron deficiency secondary to known ulcerated gastric hyperplastic polyps.  There may also be an element of malabsorption in the setting of long-term PPI use. - PLAN: Iron levels improved, no anemia. - Repeat labs with phone visit in 6 months   2.  Elevated vitamin B12 level - Patient has been taking 3 vitamin B12 Gummies (3,000 mcg each - total daily dose 9,000 mcg) at home daily in hopes that this would improve her energy level. - Labs from 01/25/2022 showed elevated vitamin B12 4,469 - PLAN: Patient instructed to stop taking B12 supplement for the next month.  She can restart vitamin B12 supplement at lower dose (1000 mcg) in 1 month.   3.  Other history - PMH: Hypertension, diabetes, asthma - SOCIAL: Patient lives at home with her husband.  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  She denies any tobacco, alcohol, illicit drug use. - FAMILY: No family history of anemia or other blood conditions.  Her father had brain tumor.  Mother deceased from unknown primary cancer with liver metastases.  Sister had unspecified cancer.   PLAN SUMMARY & DISPOSITION: Labs in 6 months Phone visit after labs  All  questions were answered. The patient knows to call the clinic with any problems, questions or concerns.  Medical decision making: Low  Time spent on visit: I spent 15 minutes counseling the patient face to face. The total time spent in the appointment was 22 minutes and more than 50% was on counseling.   Harriett Rush, PA-C  01/31/2022 11:05 AM

## 2022-01-31 ENCOUNTER — Inpatient Hospital Stay (HOSPITAL_COMMUNITY): Payer: Medicare Other | Attending: Hematology | Admitting: Physician Assistant

## 2022-01-31 VITALS — BP 156/82 | HR 65 | Temp 97.8°F | Resp 18 | Ht 61.5 in | Wt 148.7 lb

## 2022-01-31 DIAGNOSIS — Z818 Family history of other mental and behavioral disorders: Secondary | ICD-10-CM | POA: Insufficient documentation

## 2022-01-31 DIAGNOSIS — Z87442 Personal history of urinary calculi: Secondary | ICD-10-CM | POA: Insufficient documentation

## 2022-01-31 DIAGNOSIS — E611 Iron deficiency: Secondary | ICD-10-CM | POA: Insufficient documentation

## 2022-01-31 DIAGNOSIS — G2581 Restless legs syndrome: Secondary | ICD-10-CM | POA: Diagnosis not present

## 2022-01-31 DIAGNOSIS — Z800 Family history of malignant neoplasm of digestive organs: Secondary | ICD-10-CM | POA: Insufficient documentation

## 2022-01-31 DIAGNOSIS — Z808 Family history of malignant neoplasm of other organs or systems: Secondary | ICD-10-CM | POA: Diagnosis not present

## 2022-01-31 DIAGNOSIS — Z79899 Other long term (current) drug therapy: Secondary | ICD-10-CM | POA: Diagnosis not present

## 2022-01-31 DIAGNOSIS — K219 Gastro-esophageal reflux disease without esophagitis: Secondary | ICD-10-CM | POA: Diagnosis not present

## 2022-01-31 DIAGNOSIS — D500 Iron deficiency anemia secondary to blood loss (chronic): Secondary | ICD-10-CM

## 2022-01-31 DIAGNOSIS — I10 Essential (primary) hypertension: Secondary | ICD-10-CM | POA: Diagnosis not present

## 2022-01-31 DIAGNOSIS — R748 Abnormal levels of other serum enzymes: Secondary | ICD-10-CM

## 2022-01-31 DIAGNOSIS — Z885 Allergy status to narcotic agent status: Secondary | ICD-10-CM | POA: Diagnosis not present

## 2022-01-31 DIAGNOSIS — R5383 Other fatigue: Secondary | ICD-10-CM | POA: Insufficient documentation

## 2022-01-31 DIAGNOSIS — Z87440 Personal history of urinary (tract) infections: Secondary | ICD-10-CM | POA: Diagnosis not present

## 2022-01-31 DIAGNOSIS — Z833 Family history of diabetes mellitus: Secondary | ICD-10-CM | POA: Diagnosis not present

## 2022-01-31 DIAGNOSIS — Z90722 Acquired absence of ovaries, bilateral: Secondary | ICD-10-CM | POA: Diagnosis not present

## 2022-01-31 DIAGNOSIS — Z9049 Acquired absence of other specified parts of digestive tract: Secondary | ICD-10-CM | POA: Diagnosis not present

## 2022-01-31 NOTE — Patient Instructions (Signed)
[facility] at [facility] Discharge Instructions  You were seen today by Rojelio Brenner PA-C for your iron deficiency anemia.  Your blood and iron levels look great at today's visit!  You do not need any additional IV iron at this time.  However, your vitamin B12 levels are too high.  You have likely been taking too many B12 supplements at home.  I recommend that you STOP your vitamin B12 for the next month, and restart it after a month, but only take 1000 mcg daily.  FOLLOW-UP APPOINTMENT: Follow-up labs in 6 months, with phone visit 1 week after labs   Thank you for choosing [facility] at [facility] to provide your oncology and hematology care.  To afford each patient quality time with our provider, please arrive at least 15 minutes before your scheduled appointment time.   If you have a lab appointment with the Cancer Center please come in thru the Main Entrance and check in at the main information desk.  You need to re-schedule your appointment should you arrive 10 or more minutes late.  We strive to give you quality time with our providers, and arriving late affects you and other patients whose appointments are after yours.  Also, if you no show three or more times for appointments you may be dismissed from the clinic at the providers discretion.     Again, thank you for choosing [facility].  Our hope is that these requests will decrease the amount of time that you wait before being seen by our physicians.       _____________________________________________________________  Should you have questions after your visit to [facility], please contact our office at [phone] and follow the prompts.  Our office hours are 8:00 a.m. and 4:30 p.m. Monday - Friday.  Please note that voicemails left after 4:00 p.m. may not be returned until the following business day.  We are closed weekends and major holidays.  You do  have access to a nurse 24-7, just call the main number to the clinic [phone] and do not press any options, hold on the line and a nurse will answer the phone.    For prescription refill requests, have your pharmacy contact our office and allow 72 hours.    Due to Covid, you will need to wear a mask upon entering the hospital. If you do not have a mask, a mask will be given to you at the Main Entrance upon arrival. For doctor visits, patients may have 1 support person age 72 or older with them. For treatment visits, patients can not have anyone with them due to social distancing guidelines and our immunocompromised population.

## 2022-02-02 DIAGNOSIS — M5136 Other intervertebral disc degeneration, lumbar region: Secondary | ICD-10-CM | POA: Diagnosis not present

## 2022-02-02 DIAGNOSIS — M9905 Segmental and somatic dysfunction of pelvic region: Secondary | ICD-10-CM | POA: Diagnosis not present

## 2022-02-02 DIAGNOSIS — M9904 Segmental and somatic dysfunction of sacral region: Secondary | ICD-10-CM | POA: Diagnosis not present

## 2022-02-02 DIAGNOSIS — M9903 Segmental and somatic dysfunction of lumbar region: Secondary | ICD-10-CM | POA: Diagnosis not present

## 2022-02-09 DIAGNOSIS — M9905 Segmental and somatic dysfunction of pelvic region: Secondary | ICD-10-CM | POA: Diagnosis not present

## 2022-02-09 DIAGNOSIS — M5136 Other intervertebral disc degeneration, lumbar region: Secondary | ICD-10-CM | POA: Diagnosis not present

## 2022-02-09 DIAGNOSIS — M9904 Segmental and somatic dysfunction of sacral region: Secondary | ICD-10-CM | POA: Diagnosis not present

## 2022-02-09 DIAGNOSIS — M9903 Segmental and somatic dysfunction of lumbar region: Secondary | ICD-10-CM | POA: Diagnosis not present

## 2022-02-15 DIAGNOSIS — M5136 Other intervertebral disc degeneration, lumbar region: Secondary | ICD-10-CM | POA: Diagnosis not present

## 2022-02-15 DIAGNOSIS — M9905 Segmental and somatic dysfunction of pelvic region: Secondary | ICD-10-CM | POA: Diagnosis not present

## 2022-02-15 DIAGNOSIS — M9903 Segmental and somatic dysfunction of lumbar region: Secondary | ICD-10-CM | POA: Diagnosis not present

## 2022-02-15 DIAGNOSIS — M9904 Segmental and somatic dysfunction of sacral region: Secondary | ICD-10-CM | POA: Diagnosis not present

## 2022-03-09 DIAGNOSIS — M5136 Other intervertebral disc degeneration, lumbar region: Secondary | ICD-10-CM | POA: Diagnosis not present

## 2022-03-09 DIAGNOSIS — M9904 Segmental and somatic dysfunction of sacral region: Secondary | ICD-10-CM | POA: Diagnosis not present

## 2022-03-09 DIAGNOSIS — M9903 Segmental and somatic dysfunction of lumbar region: Secondary | ICD-10-CM | POA: Diagnosis not present

## 2022-03-09 DIAGNOSIS — M9905 Segmental and somatic dysfunction of pelvic region: Secondary | ICD-10-CM | POA: Diagnosis not present

## 2022-03-14 ENCOUNTER — Encounter: Payer: Self-pay | Admitting: Internal Medicine

## 2022-03-14 ENCOUNTER — Ambulatory Visit (INDEPENDENT_AMBULATORY_CARE_PROVIDER_SITE_OTHER): Payer: Medicare Other | Admitting: Internal Medicine

## 2022-03-14 VITALS — BP 144/80 | HR 67 | Temp 97.8°F | Ht 62.0 in | Wt 144.6 lb

## 2022-03-14 DIAGNOSIS — I10 Essential (primary) hypertension: Secondary | ICD-10-CM

## 2022-03-14 DIAGNOSIS — E039 Hypothyroidism, unspecified: Secondary | ICD-10-CM | POA: Diagnosis not present

## 2022-03-14 DIAGNOSIS — R066 Hiccough: Secondary | ICD-10-CM | POA: Diagnosis not present

## 2022-03-14 DIAGNOSIS — E785 Hyperlipidemia, unspecified: Secondary | ICD-10-CM | POA: Diagnosis not present

## 2022-03-14 DIAGNOSIS — Z0000 Encounter for general adult medical examination without abnormal findings: Secondary | ICD-10-CM | POA: Diagnosis not present

## 2022-03-14 DIAGNOSIS — Z8616 Personal history of COVID-19: Secondary | ICD-10-CM

## 2022-03-14 DIAGNOSIS — D509 Iron deficiency anemia, unspecified: Secondary | ICD-10-CM

## 2022-03-14 DIAGNOSIS — Z79899 Other long term (current) drug therapy: Secondary | ICD-10-CM

## 2022-03-14 DIAGNOSIS — E1165 Type 2 diabetes mellitus with hyperglycemia: Secondary | ICD-10-CM | POA: Diagnosis not present

## 2022-03-14 LAB — MICROALBUMIN / CREATININE URINE RATIO
Creatinine,U: 102.9 mg/dL
Microalb Creat Ratio: 27.4 mg/g (ref 0.0–30.0)
Microalb, Ur: 28.2 mg/dL — ABNORMAL HIGH (ref 0.0–1.9)

## 2022-03-14 LAB — CBC WITH DIFFERENTIAL/PLATELET
Basophils Absolute: 0 10*3/uL (ref 0.0–0.1)
Basophils Relative: 0.5 % (ref 0.0–3.0)
Eosinophils Absolute: 0.2 10*3/uL (ref 0.0–0.7)
Eosinophils Relative: 2.6 % (ref 0.0–5.0)
HCT: 40.1 % (ref 36.0–46.0)
Hemoglobin: 13.6 g/dL (ref 12.0–15.0)
Lymphocytes Relative: 28.6 % (ref 12.0–46.0)
Lymphs Abs: 2.6 10*3/uL (ref 0.7–4.0)
MCHC: 34 g/dL (ref 30.0–36.0)
MCV: 90.8 fl (ref 78.0–100.0)
Monocytes Absolute: 0.6 10*3/uL (ref 0.1–1.0)
Monocytes Relative: 6.8 % (ref 3.0–12.0)
Neutro Abs: 5.7 10*3/uL (ref 1.4–7.7)
Neutrophils Relative %: 61.5 % (ref 43.0–77.0)
Platelets: 283 10*3/uL (ref 150.0–400.0)
RBC: 4.41 Mil/uL (ref 3.87–5.11)
RDW: 14.5 % (ref 11.5–15.5)
WBC: 9.3 10*3/uL (ref 4.0–10.5)

## 2022-03-14 LAB — LIPID PANEL
Cholesterol: 134 mg/dL (ref 0–200)
HDL: 47.3 mg/dL (ref >39.00)
LDL Cholesterol: 64 mg/dL (ref 0–99)
NonHDL: 87.17
Total CHOL/HDL Ratio: 3
Triglycerides: 115 mg/dL (ref 0.0–149.0)
VLDL: 23 mg/dL (ref 0.0–40.0)

## 2022-03-14 LAB — BASIC METABOLIC PANEL
BUN: 23 mg/dL (ref 6–23)
CO2: 31 mEq/L (ref 19–32)
Calcium: 11.3 mg/dL — ABNORMAL HIGH (ref 8.4–10.5)
Chloride: 103 mEq/L (ref 96–112)
Creatinine, Ser: 1.11 mg/dL (ref 0.40–1.20)
GFR: 47.75 mL/min — ABNORMAL LOW (ref >60.00)
Glucose, Bld: 124 mg/dL — ABNORMAL HIGH (ref 70–99)
Potassium: 4.4 mEq/L (ref 3.5–5.1)
Sodium: 144 mEq/L (ref 135–145)

## 2022-03-14 LAB — HEPATIC FUNCTION PANEL
ALT: 25 U/L (ref 0–35)
AST: 28 U/L (ref 0–37)
Albumin: 4.5 g/dL (ref 3.5–5.2)
Alkaline Phosphatase: 62 U/L (ref 39–117)
Bilirubin, Direct: 0.1 mg/dL (ref 0.0–0.3)
Total Bilirubin: 0.4 mg/dL (ref 0.2–1.2)
Total Protein: 7.2 g/dL (ref 6.0–8.3)

## 2022-03-14 LAB — TSH: TSH: 2.73 u[IU]/mL (ref 0.35–5.50)

## 2022-03-14 LAB — HEMOGLOBIN A1C: Hgb A1c MFr Bld: 6.4 % (ref 4.6–6.5)

## 2022-03-14 MED ORDER — GABAPENTIN 100 MG PO CAPS
ORAL_CAPSULE | ORAL | 1 refills | Status: DC
Start: 2022-03-14 — End: 2022-05-27

## 2022-03-14 NOTE — Patient Instructions (Signed)
  Good to see you today . Increase dose of BP med  to valsartan 320.hctz 25  Lab today. Plan FU in about 3  months Bp check and other as indicated  I will refill the gabapentin  to use at night.   Dr Orvan Falconer suggested  trial of either valium or chlorpromazine 3 x per day (both have sedation and poss sidle effects drowsiness and  risk of fall  )  > reconsider colon cancer screen?
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
## 2022-03-15 DIAGNOSIS — M9905 Segmental and somatic dysfunction of pelvic region: Secondary | ICD-10-CM | POA: Diagnosis not present

## 2022-03-15 DIAGNOSIS — M9904 Segmental and somatic dysfunction of sacral region: Secondary | ICD-10-CM | POA: Diagnosis not present

## 2022-03-15 DIAGNOSIS — M9903 Segmental and somatic dysfunction of lumbar region: Secondary | ICD-10-CM | POA: Diagnosis not present

## 2022-03-15 DIAGNOSIS — M5136 Other intervertebral disc degeneration, lumbar region: Secondary | ICD-10-CM | POA: Diagnosis not present

## 2022-03-17 ENCOUNTER — Other Ambulatory Visit: Payer: Self-pay

## 2022-03-17 DIAGNOSIS — I10 Essential (primary) hypertension: Secondary | ICD-10-CM

## 2022-03-17 NOTE — Progress Notes (Signed)
Blood sugar is controlled  improved.  Cholesterol is at goal  liver thryoid blood count normal . Decrease kidney function is stable  clalcium level is still up some   We can repeat this when you have follow up for BP    ( Please enter  future  bmp and ionized calcium ( dx   hypercalcemia and HT)

## 2022-03-22 DIAGNOSIS — M9905 Segmental and somatic dysfunction of pelvic region: Secondary | ICD-10-CM | POA: Diagnosis not present

## 2022-03-22 DIAGNOSIS — M9903 Segmental and somatic dysfunction of lumbar region: Secondary | ICD-10-CM | POA: Diagnosis not present

## 2022-03-22 DIAGNOSIS — M9904 Segmental and somatic dysfunction of sacral region: Secondary | ICD-10-CM | POA: Diagnosis not present

## 2022-03-22 DIAGNOSIS — M5136 Other intervertebral disc degeneration, lumbar region: Secondary | ICD-10-CM | POA: Diagnosis not present

## 2022-03-23 ENCOUNTER — Telehealth: Payer: Self-pay | Admitting: Internal Medicine

## 2022-03-23 MED ORDER — VALSARTAN-HYDROCHLOROTHIAZIDE 160-25 MG PO TABS: 1.0000 | ORAL_TABLET | Freq: Every day | ORAL | 2 refills | Status: DC

## 2022-03-23 NOTE — Telephone Encounter (Signed)
Pt was seen on 03-14-2022 and still waiting on new rx valsartan-hydrochlorothiazide (DIOVAN-HCT ?mg  [facility] ([facility] ) - [place], [place] - [phone] Phone:  [phone]  Fax:  [phone]    Pt said her bp was elevated and md was giving her new strength

## 2022-03-23 NOTE — Telephone Encounter (Signed)
Rx sent to the pharmacy.

## 2022-04-11 DIAGNOSIS — M9904 Segmental and somatic dysfunction of sacral region: Secondary | ICD-10-CM | POA: Diagnosis not present

## 2022-04-11 DIAGNOSIS — M9903 Segmental and somatic dysfunction of lumbar region: Secondary | ICD-10-CM | POA: Diagnosis not present

## 2022-04-11 DIAGNOSIS — M9905 Segmental and somatic dysfunction of pelvic region: Secondary | ICD-10-CM | POA: Diagnosis not present

## 2022-04-11 DIAGNOSIS — M5136 Other intervertebral disc degeneration, lumbar region: Secondary | ICD-10-CM | POA: Diagnosis not present

## 2022-04-17 ENCOUNTER — Other Ambulatory Visit: Payer: Self-pay | Admitting: Gastroenterology

## 2022-04-17 ENCOUNTER — Other Ambulatory Visit: Payer: Self-pay | Admitting: Internal Medicine

## 2022-04-19 DIAGNOSIS — M9905 Segmental and somatic dysfunction of pelvic region: Secondary | ICD-10-CM | POA: Diagnosis not present

## 2022-04-19 DIAGNOSIS — M9904 Segmental and somatic dysfunction of sacral region: Secondary | ICD-10-CM | POA: Diagnosis not present

## 2022-04-19 DIAGNOSIS — M5136 Other intervertebral disc degeneration, lumbar region: Secondary | ICD-10-CM | POA: Diagnosis not present

## 2022-04-19 DIAGNOSIS — M9903 Segmental and somatic dysfunction of lumbar region: Secondary | ICD-10-CM | POA: Diagnosis not present

## 2022-05-03 DIAGNOSIS — M9904 Segmental and somatic dysfunction of sacral region: Secondary | ICD-10-CM | POA: Diagnosis not present

## 2022-05-03 DIAGNOSIS — M9905 Segmental and somatic dysfunction of pelvic region: Secondary | ICD-10-CM | POA: Diagnosis not present

## 2022-05-03 DIAGNOSIS — M5136 Other intervertebral disc degeneration, lumbar region: Secondary | ICD-10-CM | POA: Diagnosis not present

## 2022-05-03 DIAGNOSIS — M9903 Segmental and somatic dysfunction of lumbar region: Secondary | ICD-10-CM | POA: Diagnosis not present

## 2022-05-09 DIAGNOSIS — M9903 Segmental and somatic dysfunction of lumbar region: Secondary | ICD-10-CM | POA: Diagnosis not present

## 2022-05-09 DIAGNOSIS — M9904 Segmental and somatic dysfunction of sacral region: Secondary | ICD-10-CM | POA: Diagnosis not present

## 2022-05-09 DIAGNOSIS — M5136 Other intervertebral disc degeneration, lumbar region: Secondary | ICD-10-CM | POA: Diagnosis not present

## 2022-05-09 DIAGNOSIS — M9905 Segmental and somatic dysfunction of pelvic region: Secondary | ICD-10-CM | POA: Diagnosis not present

## 2022-05-19 DIAGNOSIS — I10 Essential (primary) hypertension: Secondary | ICD-10-CM | POA: Diagnosis not present

## 2022-05-19 DIAGNOSIS — L255 Unspecified contact dermatitis due to plants, except food: Secondary | ICD-10-CM | POA: Diagnosis not present

## 2022-05-23 ENCOUNTER — Other Ambulatory Visit: Payer: Self-pay | Admitting: Internal Medicine

## 2022-06-01 DIAGNOSIS — M9903 Segmental and somatic dysfunction of lumbar region: Secondary | ICD-10-CM | POA: Diagnosis not present

## 2022-06-01 DIAGNOSIS — M9904 Segmental and somatic dysfunction of sacral region: Secondary | ICD-10-CM | POA: Diagnosis not present

## 2022-06-01 DIAGNOSIS — M5136 Other intervertebral disc degeneration, lumbar region: Secondary | ICD-10-CM | POA: Diagnosis not present

## 2022-06-01 DIAGNOSIS — M9905 Segmental and somatic dysfunction of pelvic region: Secondary | ICD-10-CM | POA: Diagnosis not present

## 2022-06-11 IMAGING — DX DG CHEST 2V
2 series · 2 of 2 positions shown · non-contrast
Comparison: 03/04/2020

CLINICAL DATA: Cough for 5 days

EXAM:
CHEST - 2 VIEW

[chest pa]
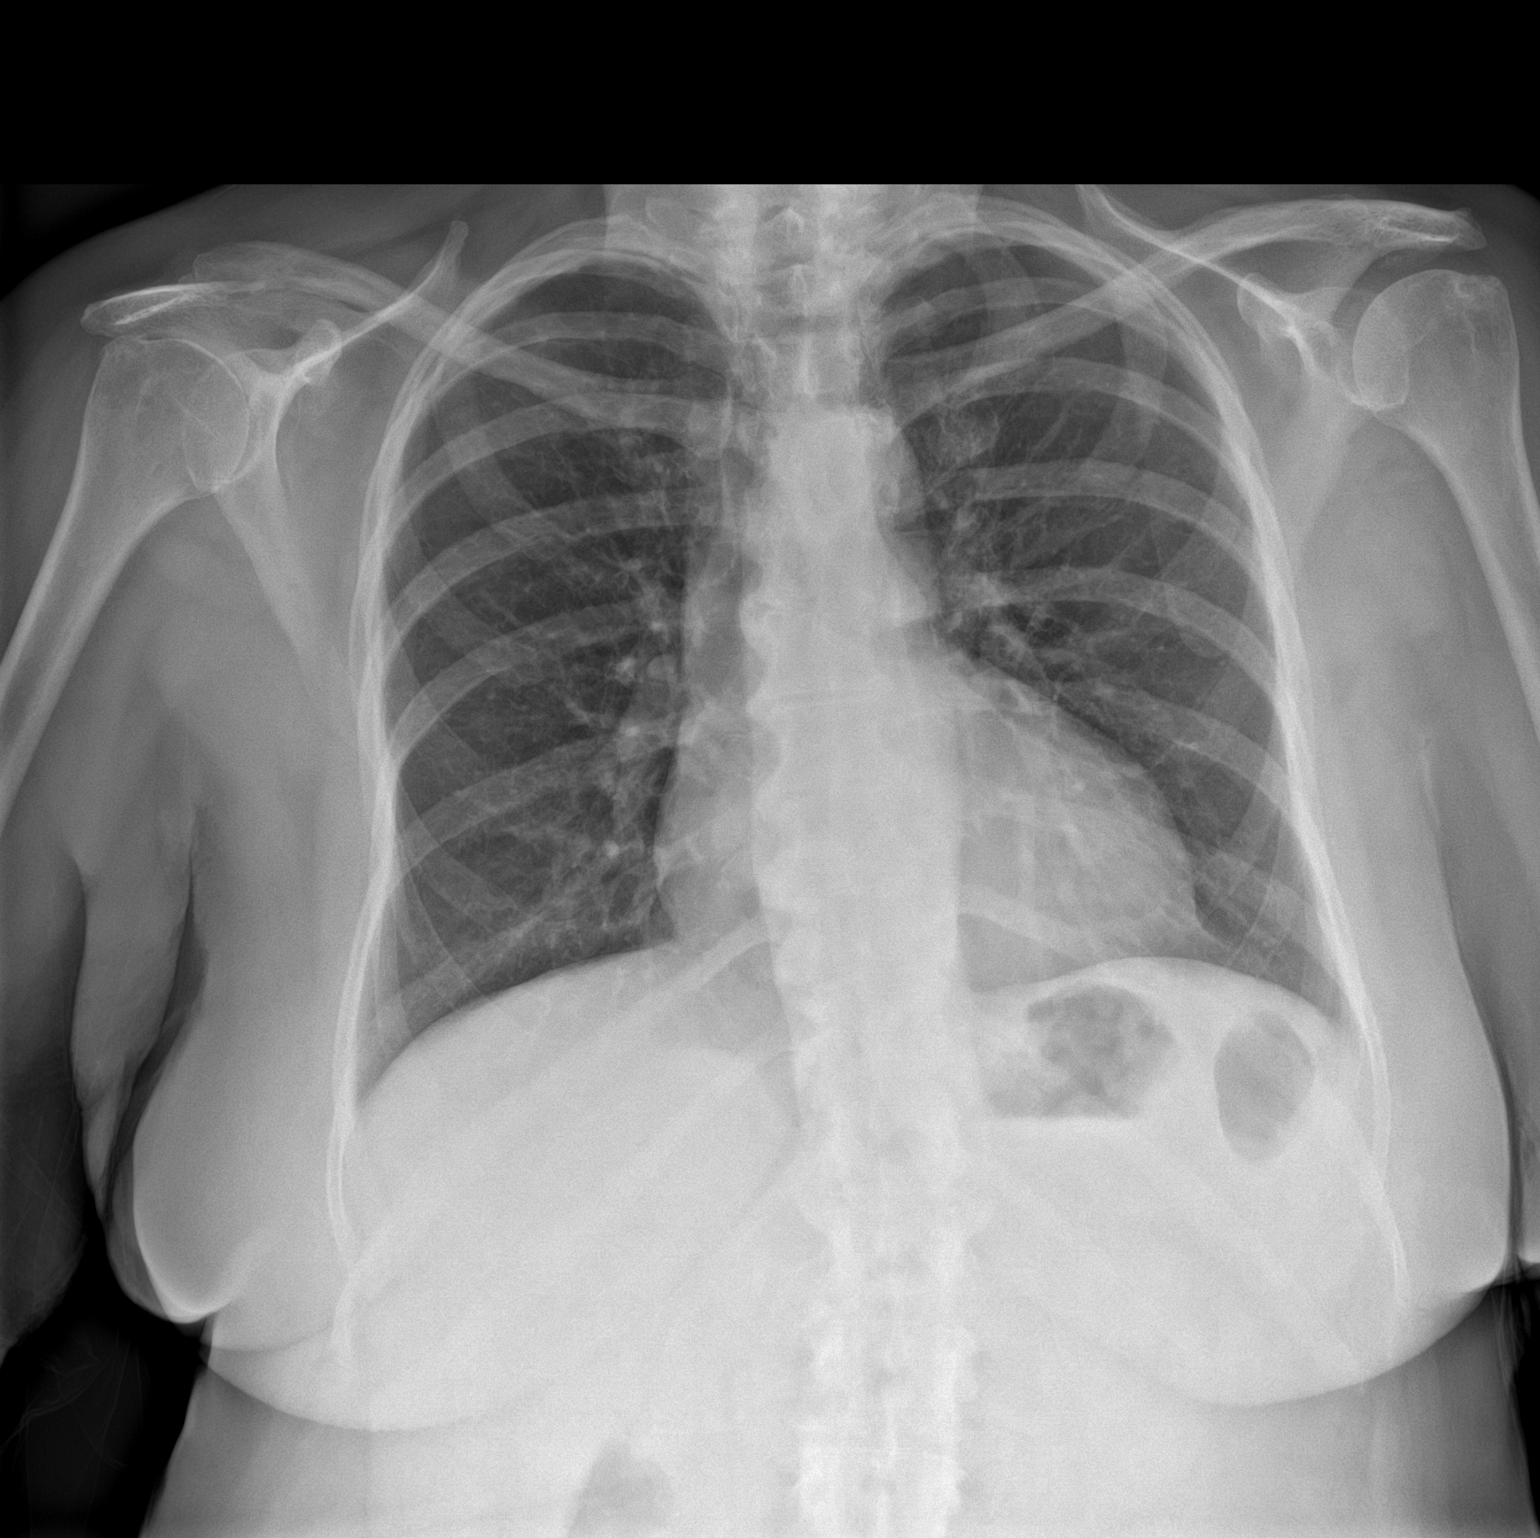

[chest lat]
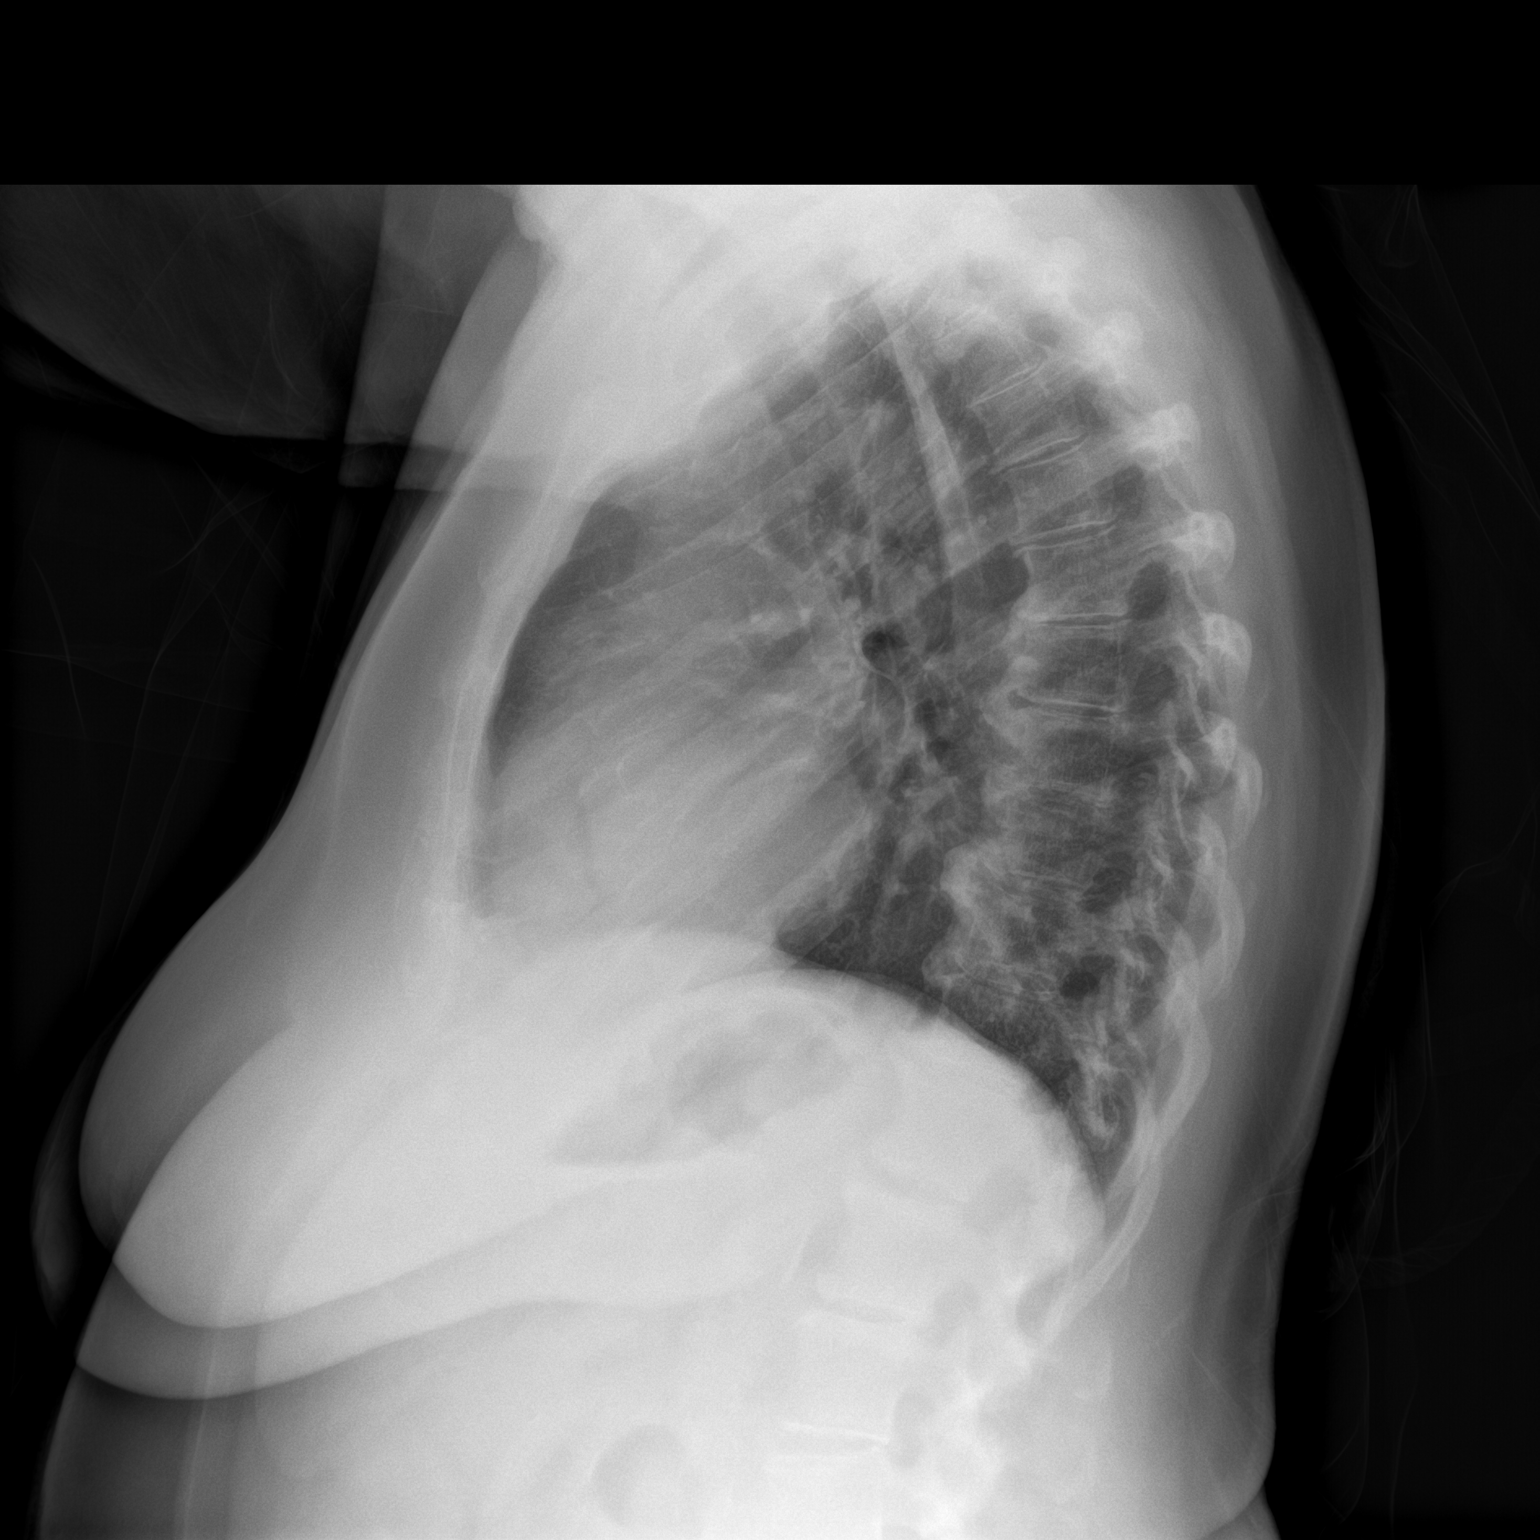

[2 of 2 positions shown; findings below may reference images not displayed]

FINDINGS: The heart size and mediastinal contours are within normal limits.
Atherosclerotic calcification of the aortic knob. No focal airspace
consolidation, pleural effusion, or pneumothorax. Mild thoracic
spondylosis.
IMPRESSION: No active cardiopulmonary disease.

## 2022-06-14 ENCOUNTER — Encounter: Payer: Self-pay | Admitting: Internal Medicine

## 2022-06-14 ENCOUNTER — Ambulatory Visit (INDEPENDENT_AMBULATORY_CARE_PROVIDER_SITE_OTHER): Payer: Medicare Other | Admitting: Internal Medicine

## 2022-06-14 VITALS — BP 149/78 | HR 74 | Resp 98

## 2022-06-14 DIAGNOSIS — M9903 Segmental and somatic dysfunction of lumbar region: Secondary | ICD-10-CM | POA: Diagnosis not present

## 2022-06-14 DIAGNOSIS — E1165 Type 2 diabetes mellitus with hyperglycemia: Secondary | ICD-10-CM

## 2022-06-14 DIAGNOSIS — Z23 Encounter for immunization: Secondary | ICD-10-CM | POA: Diagnosis not present

## 2022-06-14 DIAGNOSIS — M5136 Other intervertebral disc degeneration, lumbar region: Secondary | ICD-10-CM | POA: Diagnosis not present

## 2022-06-14 DIAGNOSIS — M9904 Segmental and somatic dysfunction of sacral region: Secondary | ICD-10-CM | POA: Diagnosis not present

## 2022-06-14 DIAGNOSIS — R066 Hiccough: Secondary | ICD-10-CM

## 2022-06-14 DIAGNOSIS — Z79899 Other long term (current) drug therapy: Secondary | ICD-10-CM

## 2022-06-14 DIAGNOSIS — I10 Essential (primary) hypertension: Secondary | ICD-10-CM

## 2022-06-14 DIAGNOSIS — Z8616 Personal history of COVID-19: Secondary | ICD-10-CM | POA: Diagnosis not present

## 2022-06-14 DIAGNOSIS — M9905 Segmental and somatic dysfunction of pelvic region: Secondary | ICD-10-CM | POA: Diagnosis not present

## 2022-06-14 LAB — POCT GLYCOSYLATED HEMOGLOBIN (HGB A1C): Hemoglobin A1C: 6.2 % — AB (ref 4.0–5.6)

## 2022-06-14 LAB — BASIC METABOLIC PANEL
BUN: 22 mg/dL (ref 6–23)
CO2: 32 mEq/L (ref 19–32)
Calcium: 10.6 mg/dL — ABNORMAL HIGH (ref 8.4–10.5)
Chloride: 102 mEq/L (ref 96–112)
Creatinine, Ser: 1.03 mg/dL (ref 0.40–1.20)
GFR: 52.15 mL/min — ABNORMAL LOW (ref >60.00)
Glucose, Bld: 153 mg/dL — ABNORMAL HIGH (ref 70–99)
Potassium: 3.9 mEq/L (ref 3.5–5.1)
Sodium: 141 mEq/L (ref 135–145)

## 2022-06-14 MED ORDER — PREMARIN 0.625 MG/GM VA CREA: TOPICAL_CREAM | VAGINAL | 4 refills | Status: AC

## 2022-06-14 MED ORDER — VALSARTAN-HYDROCHLOROTHIAZIDE 320-25 MG PO TABS: 1.0000 | ORAL_TABLET | Freq: Every day | ORAL | 1 refills | Status: DC

## 2022-06-14 NOTE — Progress Notes (Signed)
Chief Complaint  Patient presents with   Follow-up   Diabetes   Hypertension    HPI: Kerry Walters 76 y.o. come in for Chronic disease management   DM  not as hungry ewating healthy feels well  is losing weight on purpose  BP: readings  from home 129-164  average around high 140s -150 systolic , diastolic 75-80  past week   Had post covid   cough and got better and tends to come back     evey day  cough  hacky cough  no acid reflux sx .still with hiccoughs   Had endo dr Orvan Falconer  gastritis bening stricture dilated and gastric polyps poss cause of anemia  feels so much better energy wise since  iron infusions decided on not removal  but on ppi   Not on calcium : remote hx of parathyroidectomy  2012  and past hx of renal stones none recently   ROS: See pertinent positives and negatives per HPI. 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  Refill estrogen cream please  Past Medical History:  Diagnosis Date   Allergy    Asthma due to environmental allergies    no inhaler   Diabetes mellitus without complication (HCC)    Diverticulosis of colon    GERD (gastroesophageal reflux disease)    History of esophageal dilatation    FOR STRICTURE IN 2005   History of hiatal hernia    History of hypercalcemia    SECONDARY TO HYPERPARATHYROID   History of hyperparathyroidism    PRIMARY--  S/P RIGHT PARATHYROIDECTOMY   History of kidney stones    History of recurrent UTIs    Hyperlipidemia    Hypertension    Hypothyroidism    Incomplete right bundle branch block    Iron deficiency anemia due to chronic blood loss 12/01/2021   Nephrolithiasis    RIGHT    Nocturia    PONV (postoperative nausea and vomiting)    Pre-diabetes    Sciatica of right side     Family History  Problem Relation Age of Onset   Cancer Father        brain   Cancer Son        liver   Diabetes Sister    Schizophrenia Son    Colon cancer Neg Hx    Colon polyps Neg Hx    Esophageal  cancer Neg Hx    Stomach cancer Neg Hx    Pancreatic cancer Neg Hx    Rectal cancer Neg Hx     Social History   Socioeconomic History   Marital status: Married    Spouse name: Not on file   Number of children: Not on file   Years of education: Not on file   Highest education level: Not on file  Occupational History   Not on file  Tobacco Use   Smoking status: Never   Smokeless tobacco: Never  Vaping Use   Vaping Use: Never used  Substance and Sexual Activity   Alcohol use: No   Drug use: No   Sexual activity: Not on file  Other Topics Concern   Not on file  Social History Narrative   Married   hh of 2   4 dogs   G2 P2   Water exercise.   Retired    [employer]    great grandchild          Social Determinants of Corporate investment banker  Strain: Low Risk  (03/24/2020)   Overall Financial Resource Strain (CARDIA)    Difficulty of Paying Living Expenses: Not hard at all  Food Insecurity: Not on file  Transportation Needs: No Transportation Needs (03/24/2020)   PRAPARE - Administrator, Civil Service (Medical): No    Lack of Transportation (Non-Medical): No  Physical Activity: Not on file  Stress: Not on file  Social Connections: Not on file    Outpatient Medications Prior to Visit  Medication Sig Dispense Refill   atorvastatin (LIPITOR) 20 MG tablet TAKE 1 TABLET BY MOUTH IN  THE EVENING 90 tablet 3   cholecalciferol (VITAMIN D3) 25 MCG (1000 UNIT) tablet Take 1,000 Units by mouth daily.     Coenzyme Q10 (COQ-10) 400 MG CAPS Take 1 capsule by mouth daily.     ferrous sulfate 325 (65 FE) MG tablet Take 1 tablet (325 mg total) by mouth every other day. 15 tablet 11   gabapentin (NEURONTIN) 100 MG capsule TAKE 1 TO 3 CAPSULES BY MOUTH AT BEDTIME AS DIRECTED 300 capsule 2   latanoprost (XALATAN) 0.005 % ophthalmic solution SMARTSIG:In Eye(s)     levothyroxine (SYNTHROID) 75 MCG tablet TAKE 1 TABLET BY MOUTH  DAILY 100 tablet 2   Magnesium 500 MG TABS Take  2 tablets by mouth daily.     metFORMIN (GLUCOPHAGE-XR) 500 MG 24 hr tablet TAKE 3 TABLETS BY MOUTH  DAILY 270 tablet 3   pantoprazole (PROTONIX) 40 MG tablet TAKE 1 TABLET BY MOUTH IN  THE MORNING 90 tablet 3   pyridOXINE (VITAMIN B-6) 100 MG tablet Take 100 mg by mouth daily.     TURMERIC CURCUMIN PO Take 1 tablet by mouth daily.     valsartan-hydrochlorothiazide (DIOVAN-HCT) 160-25 MG tablet Take 1 tablet by mouth daily. 100 tablet 2   benzonatate (TESSALON) 100 MG capsule Take 1 capsule (100 mg total) by mouth every 8 (eight) hours. (Patient not taking: Reported on 06/14/2022) 21 capsule 0   conjugated estrogens (PREMARIN) vaginal cream Place 1 Applicatorful vaginally as directed. Use 2-3 times weekly  prn (Patient not taking: Reported on 06/14/2022) 42.5 g 2   Facility-Administered Medications Prior to Visit  Medication Dose Route Frequency Provider Last Rate Last Admin   betamethasone acetate-betamethasone sodium phosphate (CELESTONE) injection 3 mg  3 mg Intramuscular Once Gala Lewandowsky M, DPM       betamethasone acetate-betamethasone sodium phosphate (CELESTONE) injection 3 mg  3 mg Intramuscular Once Evans, Brent M, DPM         EXAM:  BP (!) 149/78 Comment: home bp  Pulse 74   Resp (!) 98   There is no height or weight on file to calculate BMI. Wt Readings from Last 3 Encounters:  03/14/22 144 lb 9.6 oz (65.6 kg)  01/31/22 148 lb 11.2 oz (67.4 kg)  12/01/21 152 lb 5.4 oz (69.1 kg)    GENERAL: vitals reviewed and listed above, alert, oriented, appears well hydrated and in no acute distress HEENT: atraumatic, conjunctiva  clear, no obvious abnormalities on inspection of external nose and ears  NECK: no obvious masses on inspection palpation  LUNGS: clear to auscultation bilaterally, no wheezes, rales or rhonchi, good air movement CV: HRRR, no clubbing cyanosis or  peripheral edema nl cap refill  MS: moves all extremities without noticeable focal  abnormality PSYCH: pleasant  and cooperative, no obvious depression or anxiety Lab Results  Component Value Date   WBC 9.3 03/14/2022   HGB 13.6 03/14/2022  HCT 40.1 03/14/2022   PLT 283.0 03/14/2022   GLUCOSE 153 (H) 06/14/2022   CHOL 134 03/14/2022   TRIG 115.0 03/14/2022   HDL 47.30 03/14/2022   LDLDIRECT 101.0 03/04/2019   LDLCALC 64 03/14/2022   ALT 25 03/14/2022   AST 28 03/14/2022   NA 141 06/14/2022   K 3.9 06/14/2022   CL 102 06/14/2022   CREATININE 1.03 06/14/2022   BUN 22 06/14/2022   CO2 32 06/14/2022   TSH 2.73 03/14/2022   INR 1.12 11/19/2009   HGBA1C 6.2 (A) 06/14/2022   MICROALBUR 28.2 (H) 03/14/2022   BP Readings from Last 3 Encounters:  06/14/22 (!) 149/78  03/14/22 (!) 144/80  01/31/22 (!) 156/82    ASSESSMENT AND PLAN:  Discussed the following assessment and plan:  Type 2 diabetes mellitus with hyperglycemia, without long-term current use of insulin (HCC) - stable in range a1c 6.2 - Plan: POC HgB A1c, PTH, Intact and Calcium, Calcium, ionized, Basic Metabolic Panel, Basic Metabolic Panel, Calcium, ionized, PTH, Intact and Calcium  Influenza vaccine needed - Plan: Flu Vaccine QUAD High Dose(Fluad)  Hypertension, unspecified type - not optimum home readings   about 25 % in range  other higher - Plan: PTH, Intact and Calcium, Calcium, ionized, Basic Metabolic Panel, Basic Metabolic Panel, Calcium, ionized, PTH, Intact and Calcium  Hypercalcemia -  on thiazide for a while doubt if main cause - Plan: PTH, Intact and Calcium, Calcium, ionized, Basic Metabolic Panel, Basic Metabolic Panel, Calcium, ionized, PTH, Intact and Calcium  Medication management - Plan: PTH, Intact and Calcium, Calcium, ionized, Basic Metabolic Panel, Basic Metabolic Panel, Calcium, ionized, PTH, Intact and Calcium  Hiccoughs  Personal history of [place] to valsartan 320/25   Plan readings 3 mos fu   Update calcium levels and pth and renal function Record review  assuming weight loss  is intentional  and healthy reason  -Patient advised to return or notify health care team  if  new concerns arise.  Patient Instructions  Good to see  you today . A1c is good   controlled blood sugar. Repeating calcium studies  Alter dose of valsartan hctz . To 320/25   Fu in  2-3 months for bp assessment .   Consider   rsv vaccine.     Kerry Walters.  M.D.

## 2022-06-14 NOTE — Patient Instructions (Addendum)
Good to see  you today . A1c is good   controlled blood sugar. Repeating calcium studies  Alter dose of valsartan hctz . To 320/25   Fu in  2-3 months for bp assessment .   Consider   rsv vaccine.

## 2022-06-15 LAB — PTH, INTACT AND CALCIUM
Calcium: 10.8 mg/dL — ABNORMAL HIGH (ref 8.6–10.4)
PTH: 76 pg/mL (ref 16–77)

## 2022-06-15 LAB — CALCIUM, IONIZED: Calcium, Ion: 5.5 mg/dL (ref 4.7–5.5)

## 2022-06-15 LAB — EXTRA SPECIMEN

## 2022-06-20 DIAGNOSIS — M9904 Segmental and somatic dysfunction of sacral region: Secondary | ICD-10-CM | POA: Diagnosis not present

## 2022-06-20 DIAGNOSIS — M5136 Other intervertebral disc degeneration, lumbar region: Secondary | ICD-10-CM | POA: Diagnosis not present

## 2022-06-20 DIAGNOSIS — M9905 Segmental and somatic dysfunction of pelvic region: Secondary | ICD-10-CM | POA: Diagnosis not present

## 2022-06-20 DIAGNOSIS — M9903 Segmental and somatic dysfunction of lumbar region: Secondary | ICD-10-CM | POA: Diagnosis not present

## 2022-06-21 ENCOUNTER — Telehealth: Payer: Self-pay

## 2022-06-21 NOTE — Telephone Encounter (Signed)
Left message for patient to call back. Patient needs follow up scheduled.

## 2022-06-21 NOTE — Telephone Encounter (Signed)
Thornton Park, MD  Carl Best, RN  Please arrange office follow-up with me or an APP - next available. Thanks.   KLB    Previous Messages    ----- Message -----  From: Burnis Medin, MD  Sent: 06/19/2022  10:26 AM EDT  To: Thornton Park, MD   KB,   She still has hiccoughs but says feeling ok better since  iron infusion otherwise . Not sure she has a fu with you . WP

## 2022-06-22 NOTE — Telephone Encounter (Signed)
Schedule f/u with patient for 08/08/22 at 2:10 pm with Dr. Tarri Glenn.

## 2022-06-27 DIAGNOSIS — M9904 Segmental and somatic dysfunction of sacral region: Secondary | ICD-10-CM | POA: Diagnosis not present

## 2022-06-27 DIAGNOSIS — M5136 Other intervertebral disc degeneration, lumbar region: Secondary | ICD-10-CM | POA: Diagnosis not present

## 2022-06-27 DIAGNOSIS — M9905 Segmental and somatic dysfunction of pelvic region: Secondary | ICD-10-CM | POA: Diagnosis not present

## 2022-06-27 DIAGNOSIS — M9903 Segmental and somatic dysfunction of lumbar region: Secondary | ICD-10-CM | POA: Diagnosis not present

## 2022-06-29 DIAGNOSIS — M9903 Segmental and somatic dysfunction of lumbar region: Secondary | ICD-10-CM | POA: Diagnosis not present

## 2022-06-29 DIAGNOSIS — M9904 Segmental and somatic dysfunction of sacral region: Secondary | ICD-10-CM | POA: Diagnosis not present

## 2022-06-29 DIAGNOSIS — M5136 Other intervertebral disc degeneration, lumbar region: Secondary | ICD-10-CM | POA: Diagnosis not present

## 2022-06-29 DIAGNOSIS — M9905 Segmental and somatic dysfunction of pelvic region: Secondary | ICD-10-CM | POA: Diagnosis not present

## 2022-07-04 DIAGNOSIS — M9903 Segmental and somatic dysfunction of lumbar region: Secondary | ICD-10-CM | POA: Diagnosis not present

## 2022-07-04 DIAGNOSIS — M9905 Segmental and somatic dysfunction of pelvic region: Secondary | ICD-10-CM | POA: Diagnosis not present

## 2022-07-04 DIAGNOSIS — M5136 Other intervertebral disc degeneration, lumbar region: Secondary | ICD-10-CM | POA: Diagnosis not present

## 2022-07-04 DIAGNOSIS — M9904 Segmental and somatic dysfunction of sacral region: Secondary | ICD-10-CM | POA: Diagnosis not present

## 2022-07-06 DIAGNOSIS — M9904 Segmental and somatic dysfunction of sacral region: Secondary | ICD-10-CM | POA: Diagnosis not present

## 2022-07-06 DIAGNOSIS — M9905 Segmental and somatic dysfunction of pelvic region: Secondary | ICD-10-CM | POA: Diagnosis not present

## 2022-07-06 DIAGNOSIS — M5136 Other intervertebral disc degeneration, lumbar region: Secondary | ICD-10-CM | POA: Diagnosis not present

## 2022-07-06 DIAGNOSIS — M9903 Segmental and somatic dysfunction of lumbar region: Secondary | ICD-10-CM | POA: Diagnosis not present

## 2022-07-07 NOTE — Progress Notes (Signed)
A1c is stable  ab it better 6.2    Calcium is stable  Keep appt in January

## 2022-07-11 DIAGNOSIS — M9904 Segmental and somatic dysfunction of sacral region: Secondary | ICD-10-CM | POA: Diagnosis not present

## 2022-07-11 DIAGNOSIS — M9903 Segmental and somatic dysfunction of lumbar region: Secondary | ICD-10-CM | POA: Diagnosis not present

## 2022-07-11 DIAGNOSIS — M5136 Other intervertebral disc degeneration, lumbar region: Secondary | ICD-10-CM | POA: Diagnosis not present

## 2022-07-11 DIAGNOSIS — M9905 Segmental and somatic dysfunction of pelvic region: Secondary | ICD-10-CM | POA: Diagnosis not present

## 2022-07-12 DIAGNOSIS — E119 Type 2 diabetes mellitus without complications: Secondary | ICD-10-CM | POA: Diagnosis not present

## 2022-07-12 DIAGNOSIS — H401131 Primary open-angle glaucoma, bilateral, mild stage: Secondary | ICD-10-CM | POA: Diagnosis not present

## 2022-07-12 DIAGNOSIS — H2513 Age-related nuclear cataract, bilateral: Secondary | ICD-10-CM | POA: Diagnosis not present

## 2022-07-14 DIAGNOSIS — M9905 Segmental and somatic dysfunction of pelvic region: Secondary | ICD-10-CM | POA: Diagnosis not present

## 2022-07-14 DIAGNOSIS — M9903 Segmental and somatic dysfunction of lumbar region: Secondary | ICD-10-CM | POA: Diagnosis not present

## 2022-07-14 DIAGNOSIS — M5136 Other intervertebral disc degeneration, lumbar region: Secondary | ICD-10-CM | POA: Diagnosis not present

## 2022-07-14 DIAGNOSIS — M9904 Segmental and somatic dysfunction of sacral region: Secondary | ICD-10-CM | POA: Diagnosis not present

## 2022-07-18 DIAGNOSIS — M5136 Other intervertebral disc degeneration, lumbar region: Secondary | ICD-10-CM | POA: Diagnosis not present

## 2022-07-18 DIAGNOSIS — M9904 Segmental and somatic dysfunction of sacral region: Secondary | ICD-10-CM | POA: Diagnosis not present

## 2022-07-18 DIAGNOSIS — M9905 Segmental and somatic dysfunction of pelvic region: Secondary | ICD-10-CM | POA: Diagnosis not present

## 2022-07-18 DIAGNOSIS — M9903 Segmental and somatic dysfunction of lumbar region: Secondary | ICD-10-CM | POA: Diagnosis not present

## 2022-07-25 DIAGNOSIS — M9903 Segmental and somatic dysfunction of lumbar region: Secondary | ICD-10-CM | POA: Diagnosis not present

## 2022-07-25 DIAGNOSIS — M9904 Segmental and somatic dysfunction of sacral region: Secondary | ICD-10-CM | POA: Diagnosis not present

## 2022-07-25 DIAGNOSIS — M9905 Segmental and somatic dysfunction of pelvic region: Secondary | ICD-10-CM | POA: Diagnosis not present

## 2022-07-25 DIAGNOSIS — M5136 Other intervertebral disc degeneration, lumbar region: Secondary | ICD-10-CM | POA: Diagnosis not present

## 2022-07-28 DIAGNOSIS — M5136 Other intervertebral disc degeneration, lumbar region: Secondary | ICD-10-CM | POA: Diagnosis not present

## 2022-07-28 DIAGNOSIS — M9905 Segmental and somatic dysfunction of pelvic region: Secondary | ICD-10-CM | POA: Diagnosis not present

## 2022-07-28 DIAGNOSIS — M9903 Segmental and somatic dysfunction of lumbar region: Secondary | ICD-10-CM | POA: Diagnosis not present

## 2022-07-28 DIAGNOSIS — M9904 Segmental and somatic dysfunction of sacral region: Secondary | ICD-10-CM | POA: Diagnosis not present

## 2022-07-29 ENCOUNTER — Inpatient Hospital Stay: Payer: Medicare Other | Attending: Physician Assistant

## 2022-07-29 DIAGNOSIS — D500 Iron deficiency anemia secondary to blood loss (chronic): Secondary | ICD-10-CM

## 2022-07-29 DIAGNOSIS — F5089 Other specified eating disorder: Secondary | ICD-10-CM | POA: Diagnosis not present

## 2022-07-29 DIAGNOSIS — I10 Essential (primary) hypertension: Secondary | ICD-10-CM | POA: Diagnosis not present

## 2022-07-29 DIAGNOSIS — G2581 Restless legs syndrome: Secondary | ICD-10-CM | POA: Diagnosis not present

## 2022-07-29 DIAGNOSIS — E538 Deficiency of other specified B group vitamins: Secondary | ICD-10-CM | POA: Diagnosis not present

## 2022-07-29 DIAGNOSIS — E611 Iron deficiency: Secondary | ICD-10-CM | POA: Diagnosis not present

## 2022-07-29 DIAGNOSIS — J45909 Unspecified asthma, uncomplicated: Secondary | ICD-10-CM | POA: Diagnosis not present

## 2022-07-29 DIAGNOSIS — E119 Type 2 diabetes mellitus without complications: Secondary | ICD-10-CM | POA: Insufficient documentation

## 2022-07-29 DIAGNOSIS — Z79899 Other long term (current) drug therapy: Secondary | ICD-10-CM | POA: Diagnosis not present

## 2022-07-29 DIAGNOSIS — R748 Abnormal levels of other serum enzymes: Secondary | ICD-10-CM

## 2022-07-29 DIAGNOSIS — R5383 Other fatigue: Secondary | ICD-10-CM | POA: Diagnosis not present

## 2022-07-29 DIAGNOSIS — M549 Dorsalgia, unspecified: Secondary | ICD-10-CM | POA: Diagnosis not present

## 2022-07-29 LAB — CBC WITH DIFFERENTIAL/PLATELET
Abs Immature Granulocytes: 0.04 10*3/uL (ref 0.00–0.07)
Basophils Absolute: 0 10*3/uL (ref 0.0–0.1)
Basophils Relative: 0 %
Eosinophils Absolute: 0.2 10*3/uL (ref 0.0–0.5)
Eosinophils Relative: 2 %
HCT: 37.7 % (ref 36.0–46.0)
Hemoglobin: 12.4 g/dL (ref 12.0–15.0)
Immature Granulocytes: 0 %
Lymphocytes Relative: 26 %
Lymphs Abs: 2.8 10*3/uL (ref 0.7–4.0)
MCH: 30.5 pg (ref 26.0–34.0)
MCHC: 32.9 g/dL (ref 30.0–36.0)
MCV: 92.6 fL (ref 80.0–100.0)
Monocytes Absolute: 0.8 10*3/uL (ref 0.1–1.0)
Monocytes Relative: 7 %
Neutro Abs: 7 10*3/uL (ref 1.7–7.7)
Neutrophils Relative %: 65 %
Platelets: 331 10*3/uL (ref 150–400)
RBC: 4.07 MIL/uL (ref 3.87–5.11)
RDW: 13 % (ref 11.5–15.5)
WBC: 10.9 10*3/uL — ABNORMAL HIGH (ref 4.0–10.5)
nRBC: 0 % (ref 0.0–0.2)

## 2022-07-29 LAB — IRON AND TIBC
Iron: 52 ug/dL (ref 28–170)
Saturation Ratios: 17 % (ref 10.4–31.8)
TIBC: 303 ug/dL (ref 250–450)
UIBC: 251 ug/dL

## 2022-07-29 LAB — VITAMIN B12: Vitamin B-12: 1879 pg/mL — ABNORMAL HIGH (ref 180–914)

## 2022-07-29 LAB — FERRITIN: Ferritin: 223 ng/mL (ref 11–307)

## 2022-08-01 ENCOUNTER — Other Ambulatory Visit: Payer: Self-pay | Admitting: Internal Medicine

## 2022-08-01 DIAGNOSIS — M9904 Segmental and somatic dysfunction of sacral region: Secondary | ICD-10-CM | POA: Diagnosis not present

## 2022-08-01 DIAGNOSIS — M9905 Segmental and somatic dysfunction of pelvic region: Secondary | ICD-10-CM | POA: Diagnosis not present

## 2022-08-01 DIAGNOSIS — M9903 Segmental and somatic dysfunction of lumbar region: Secondary | ICD-10-CM | POA: Diagnosis not present

## 2022-08-01 DIAGNOSIS — M5136 Other intervertebral disc degeneration, lumbar region: Secondary | ICD-10-CM | POA: Diagnosis not present

## 2022-08-02 ENCOUNTER — Inpatient Hospital Stay (HOSPITAL_BASED_OUTPATIENT_CLINIC_OR_DEPARTMENT_OTHER): Payer: Medicare Other | Admitting: Physician Assistant

## 2022-08-02 DIAGNOSIS — R748 Abnormal levels of other serum enzymes: Secondary | ICD-10-CM | POA: Diagnosis not present

## 2022-08-02 DIAGNOSIS — D500 Iron deficiency anemia secondary to blood loss (chronic): Secondary | ICD-10-CM

## 2022-08-02 LAB — METHYLMALONIC ACID, SERUM: Methylmalonic Acid, Quantitative: 186 nmol/L (ref 0–378)

## 2022-08-02 NOTE — Progress Notes (Signed)
Virtual Visit via Telephone Note [facility]  I connected with Kerry Walters  on 08/02/2022 at 4:16 PM by telephone and verified that I am speaking with the correct person using two identifiers.  Location: Patient: Home Provider: [facility]   I discussed the limitations, risks, security and privacy concerns of performing an evaluation and management service by telephone and the availability of in person appointments. I also discussed with the patient that there may be a patient responsible charge related to this service. The patient expressed understanding and agreed to proceed.  REASON FOR VISIT:  Follow-up for iron deficiency   PRIOR THERAPY: Oral iron tablets   CURRENT THERAPY:  IV iron as needed   INTERVAL HISTORY:  Kerry Walters 76 y.o. female is contacted today for routine follow-up of her iron deficiency state without anemia.  She was last seen by Rojelio Brenner PA-C on 01/31/2022.  She received IV Feraheme x2 on 12/06/2021 and 12/13/2021.   At today's visit, she reports feeling fairly well.  No recent hospitalizations, surgeries, or changes in baseline health status.  Overall, she reports feeling somewhat improved energy lately, except for on the days when she has not slept well.  She continues to have chronic restless legs, for which she takes gabapentin. She denies any pica, chest pain, dyspnea on exertion, or syncope.  She continues to deny any bright blood per rectum, melena, epistaxis, or other source of blood loss.  She has been taking iron pill daily.  She decreased the dose of her vitamin B12 to 1000 mcg daily after our last appointment.  She has 80% energy and 100% appetite. She endorses that she is maintaining a stable weight.     OBSERVATIONS/OBJECTIVE: Review of Systems  Constitutional:  Negative for chills, diaphoresis, fever, malaise/fatigue and weight loss.  Respiratory:  Negative for cough and shortness of breath.   Cardiovascular:   Negative for chest pain and palpitations.  Gastrointestinal:  Negative for abdominal pain, blood in stool, melena, nausea and vomiting.  Musculoskeletal:  Positive for back pain.  Neurological:  Negative for dizziness and headaches.  Psychiatric/Behavioral:  The patient has insomnia.      PHYSICAL EXAM (per limitations of virtual telephone visit): The patient is alert and oriented x 3, exhibiting adequate mentation, good mood, and ability to speak in full sentences and execute sound judgement.   ASSESSMENT & PLAN: 1.  Iron deficiency without anemia - Seen at the request of her gastroenterologist (Dr. Tressia Danas) for treatment of her iron deficiency. -- Per note by Dr. Orvan Falconer (11/17/2021), patient has ulcerated gastric hyperplastic polyps that are thought to be the likely source of iron deficiency.  Patient does not have any overt bleeding.  She has been recommended to have repeat EGD with resection of polyps, which she is taking some time to think about. -- Iron panel (11/04/2021) showed ferritin 30 with iron saturation 13% and TIBC 350. - Nutritional panel was negative for B12 deficiency.  Normal folate, homocystine, copper. -- She has not had a CBC since 03/09/2021, and at that time had Hgb 12.7. - CBC (12/01/2021): Hgb 12.0/MCV 88.8 - No history of blood transfusion - Took daily ferrous sulfate for the 6 months without improvement - IV Feraheme x2 on 12/06/2021 and 12/13/2021.  Premeds given due to multiple medication allergies. -Chronic restless legs.  Fatigue and pica resolved after IV iron. - No rectal bleeding or melena.   - Most recent labs (07/29/2022): Hgb 12.4/MCV 92.6 (isolated  leukocytosis with WBC 10.9/normal differential and platelets).  Ferritin 223, iron saturation 17% --Differential diagnosis favors iron deficiency secondary to known ulcerated gastric hyperplastic polyps.  There may also be an element of malabsorption in the setting of long-term PPI use. - PLAN: No indication  for IV iron at this time.  Continue taking iron pill daily. - Repeat labs with phone visit in 6 months    2.  Elevated vitamin B12 level - Patient has been taking 3 vitamin B12 Gummies (3,000 mcg each - total daily dose 9,000 mcg) at home daily in hopes that this would improve her energy level. - Labs from 01/25/2022 showed elevated vitamin B12 4469 - Currently taking vitamin B12 1000 mcg daily - Most recent labs (07/29/2022) show elevated but improved vitamin B12 1879, with normal MMA 186 - PLAN: Patient instructed to decrease her vitamin B12 to 1000 mcg EVERY OTHER DAY.  Recheck levels at next visit.   3.  Other history - PMH: Hypertension, diabetes, asthma - SOCIAL: Patient lives at home with her husband.  She is retired from work as the Health and safety inspector at [employer].  She denies any tobacco, alcohol, illicit drug use. - FAMILY: No family history of anemia or other blood conditions.  Her father had brain tumor.  Mother deceased from unknown primary cancer with liver metastases.  Sister had unspecified cancer.     PLAN SUMMARY: >> Labs in 6 months (CBC, ferritin, iron/TIBC, B12, MMA) ** Patient requests NO lab appointments on Fridays >> PHONE visit 1 week after labs    I discussed the assessment and treatment plan with the patient. The patient was provided an opportunity to ask questions and all were answered. The patient agreed with the plan and demonstrated an understanding of the instructions.   The patient was advised to call back or seek an in-person evaluation if the symptoms worsen or if the condition fails to improve as anticipated.  I provided 18 minutes of non-face-to-face time during this encounter.   Carnella Guadalajara, PA-C 08/02/2022 5:44 PM

## 2022-08-03 ENCOUNTER — Other Ambulatory Visit: Payer: Self-pay

## 2022-08-03 DIAGNOSIS — R748 Abnormal levels of other serum enzymes: Secondary | ICD-10-CM

## 2022-08-03 DIAGNOSIS — D500 Iron deficiency anemia secondary to blood loss (chronic): Secondary | ICD-10-CM

## 2022-08-04 DIAGNOSIS — M9904 Segmental and somatic dysfunction of sacral region: Secondary | ICD-10-CM | POA: Diagnosis not present

## 2022-08-04 DIAGNOSIS — M9903 Segmental and somatic dysfunction of lumbar region: Secondary | ICD-10-CM | POA: Diagnosis not present

## 2022-08-04 DIAGNOSIS — M5136 Other intervertebral disc degeneration, lumbar region: Secondary | ICD-10-CM | POA: Diagnosis not present

## 2022-08-04 DIAGNOSIS — M9905 Segmental and somatic dysfunction of pelvic region: Secondary | ICD-10-CM | POA: Diagnosis not present

## 2022-08-05 ENCOUNTER — Telehealth: Payer: Medicare Other | Admitting: Physician Assistant

## 2022-08-08 ENCOUNTER — Ambulatory Visit: Payer: Medicare Other | Admitting: Gastroenterology

## 2022-08-08 ENCOUNTER — Encounter: Payer: Self-pay | Admitting: Gastroenterology

## 2022-08-08 VITALS — BP 146/80 | HR 72 | Ht 61.5 in | Wt 148.0 lb

## 2022-08-08 DIAGNOSIS — K317 Polyp of stomach and duodenum: Secondary | ICD-10-CM | POA: Diagnosis not present

## 2022-08-08 DIAGNOSIS — M5136 Other intervertebral disc degeneration, lumbar region: Secondary | ICD-10-CM | POA: Diagnosis not present

## 2022-08-08 DIAGNOSIS — R066 Hiccough: Secondary | ICD-10-CM | POA: Diagnosis not present

## 2022-08-08 DIAGNOSIS — M9904 Segmental and somatic dysfunction of sacral region: Secondary | ICD-10-CM | POA: Diagnosis not present

## 2022-08-08 DIAGNOSIS — M9905 Segmental and somatic dysfunction of pelvic region: Secondary | ICD-10-CM | POA: Diagnosis not present

## 2022-08-08 DIAGNOSIS — M9903 Segmental and somatic dysfunction of lumbar region: Secondary | ICD-10-CM | POA: Diagnosis not present

## 2022-08-08 DIAGNOSIS — D509 Iron deficiency anemia, unspecified: Secondary | ICD-10-CM | POA: Diagnosis not present

## 2022-08-08 NOTE — Patient Instructions (Addendum)
It was a pleasure to see you today.  I am running out of options to evaluate and treat your hiccups. I strongly recommend another upper endoscopy with removal of the known polyps and another colonoscopy.   If you do not want to proceed with endoscopy, I want you to go to [facility] for a second opinion.  Given your iron deficiency anemia, there is a chance that we are missing cancer and possibly missing the opportunity to prevent cancer.   _______________________________________________________  If you are age 101 or older, your body mass index should be between 23-30. Your Body mass index is 27.51 kg/m. If this is out of the aforementioned range listed, please consider follow up with your Primary Care Provider.  If you are age 74 or younger, your body mass index should be between 19-25. Your Body mass index is 27.51 kg/m. If this is out of the aformentioned range listed, please consider follow up with your Primary Care Provider.   ________________________________________________________  The [facility] providers would like to encourage you to use [facility] to communicate with providers for non-urgent requests or questions.  Due to long hold times on the telephone, sending your provider a message by [facility] may be a faster and more efficient way to get a response.  Please allow 48 business hours for a response.  Please remember that this is for non-urgent requests.  _______________________________________________________

## 2022-08-08 NOTE — Progress Notes (Addendum)
Referring Provider: Burnis Medin, MD Primary Care Physician:  Burnis Medin, MD  Chief complaint:  Iron deficiency anemia   IMPRESSION:  Daily, intermittent Hiccups not explained by EGD or improved on PPI BID    - not changed by pantoprazole 40 mg BID or baclofen 5 TID ? Esophageal varices on EGD with normal platelets Echogenic liver on ultrasound History of esophageal stricture without obstructive symptoms    - empiric dilation performed 11/24/20 Hiatal hernia Iron deficiency without anemia anemia Diverticulosis without history of diverticulitis  Chronic hiccups without neurologic symptoms: No esophageal and gastric etiologies identified on EGD to explain her hiccups. Incomplete relief with PPI or baclofen. She previously declined UGI and possible cross-sectional imaging. Dr. Regis Bill did not feel that  benzos (like Valium) or chlorpromazine were safe given her age. I am running out of treatment options and have recommended that she go to [facility] for their input.  Iron deficiency: Without anemia. No overt bleeding. Hyperplastic gastric polyps may be the cause, however, she has not had a recent colonoscopy. She has been reluctant to proceed with endoscopy was hoping to avoid it all together. She is now agreeable to EGD at the hospital with attempted resection with Dr. Rush Landmark. She has planned follow-up with hematology to determine if additional IV iron is indicated.   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 Repeat EGD at the hospital with attempt at resection recommended.   Prominent esophageal veins on EGD: ? Early varices. Platelets normal. Abdominal ultrasound showed no evidence for cirrhosis or portal hypertension. Low threshold to consider elastography, particularly given her diabetes.  This would be another reason to consider contrasted cross-sectional imaging.   History of esophageal stricture: Remains asymptomatic.   Discussed colon cancer  screening. She is now willing to proceed.   Diverticulosis: High fiber diet recommended.     PLAN: - continue pantoprazole BID - follow-up with hematology for iron deficiency anemia - EGD with gastric polypectomies (will ask Dr. Rush Landmark to schedule) - Colonoscopy to be scheduled at the time of EGD - Zofran for use prior to each dose of purgative for the colonoscopy - Referral to [facility] for further evaluation/input re: hiccups   I spent over 30 minutes, including in depth chart review, independent review of results, communicating results with the patient directly, face-to-face time with the patient, coordinating care, and ordering studies and medications as appropriate, and documentation.   HPI: Kerry Walters is a 76 y.o. female initially referred by Dr. Regis Bill for hiccups, reflux, and iron deficiency. She was last seen 04/16/21. She has a history of childhood asthma, diabetes, hypertension, reflux, and kidney stones.   Developed near daily hiccups over a year ago. Had been occurring every time that she eats as well as episodes that occur without eating. No identified food triggers. Not associated with overeating. Rare carbonated beverages. Rare use of chewing gum. She does not smoke. No heartburn, dysphagia, dysphonia. Has a frequent cough that she can control if her head is elevated.  No concurrent headache or neurologic symptoms. She also wishes to avoid medications.   EGD 11/24/20 showed a benign distal esophageal stricture dilated with a 16 to 18 mm TTS balloon, 3 prominent veins in the mid esophagus suggesting early varices, gastritis, gastric erosions, multiple benign gastric polyps, a small hiatal hernia.  Antral biopsies showed reactive gastropathy and focal intestinal metaplasia.  Fundus and gastric body biopsy showed mild chronic gastritis.  There was no H. pylori.  Gastric polyps  were hyperplastic polyps with eroded granulation tissue.  Esophageal biopsies were normal.  Hiccups  slightly improved but not enough that she was comfortable with first pantoprazole and more recently baclofen 5 mg TID. She denies the use of any NSAIDs. She has had no dysphagia.  She feels like the hiccups are getting worse. Today, she notes that the hiccups frequently happen before she even eats dinner.  Since her last visit she has been seen by hematology.  She continues on oral iron supplements and has had 2 IV iron infusions.  She is pleased with her response to IV iron.  Labs 07/29/2022: Iron 52, ferritin 223, hemoglobin 12.4, MCV 92.6, RDW 13, platelets 331  Abdominal ultrasound 01/14/21 to evaluate for her possible esophageal varices showed echogenic liver and was otherwise normal. Spleen normal.  Prior endoscopic history: - EGD 10/22/2003: Esophageal stricture dilated from 14-11mm savary dilator, small hiatal hernia, and biopsy-proven reflux - Colonoscopy with Dr. Olevia Perches 12/22/2004: diverticulosis in the left colon - Cologuard 08/31/16: negative - EGD   Labs 09/07/20: iron 52, ferritin 14, hemoglobin 12.6, MCV 85.6, RDW 15.1, platelets 291 Labs 01/05/21: iron 54, ferritin 15.9, hemoglobin 12.2 Labs 03/09/21: hemoglobin 12.7, MCV 86.5, platelets 297, normal liver enzymes  Prior abdominal imaging: - CT abd/pelvis with contrast 09/11/14 for abdominal pain showed diverticulosis, renal calculi with possible UPJ stricture, large hiatal hernia - Abdominal ultrasound 01/14/21 to evaluate for her possible esophageal varices showed echogenic liver and was otherwise normal. Spleen normal.    Past Medical History:  Diagnosis Date   Allergy    Asthma due to environmental allergies    no inhaler   Diabetes mellitus without complication ([place])    Diverticulosis of colon    GERD (gastroesophageal reflux disease)    History of esophageal dilatation    FOR STRICTURE IN 2005   History of hiatal hernia    History of hypercalcemia    SECONDARY TO HYPERPARATHYROID   History of hyperparathyroidism     PRIMARY--  S/P RIGHT PARATHYROIDECTOMY   History of kidney stones    History of recurrent UTIs    Hyperlipidemia    Hypertension    Hypothyroidism    Incomplete right bundle branch block    Iron deficiency anemia due to chronic blood loss 12/01/2021   Nephrolithiasis    RIGHT    Nocturia    PONV (postoperative nausea and vomiting)    Pre-diabetes    Sciatica of right side     Past Surgical History:  Procedure Laterality Date   APPENDECTOMY  1974   CYSTOSCOPY WITH URETEROSCOPY AND STENT PLACEMENT Right 11/11/2014   Procedure: CYSTOSCOPY WITH URETEROSCOPY AND STENT PLACEMENT;  Surgeon: Ardis Hughs, MD;  Location: [facility];  Service: Urology;  Laterality: Right;   EXCISION URETHRAL CARUNCLE  1981   Open procedure   KNEE ARTHROSCOPY Left april 2010   REMOVAL RIP  1973   First Rib on Right side-- for impingement   RIGHT INFERIOR PARATHYROIDECTOMY, MINIMALLY INVASIVE  08-14-2008   TONSILLECTOMY  as child   TOTAL KNEE ARTHROPLASTY Left 11-17-2009   TUBAL LIGATION  1973   VAGINAL HYSTERECTOMY  1995   w/ Bilateral Salpingoophorectomy    Current Outpatient Medications  Medication Sig Dispense Refill   atorvastatin (LIPITOR) 20 MG tablet TAKE 1 TABLET BY MOUTH IN THE  EVENING 100 tablet 2   cholecalciferol (VITAMIN D3) 25 MCG (1000 UNIT) tablet Take 1,000 Units by mouth daily.     Coenzyme Q10 (COQ-10) 400 MG  CAPS Take 1 capsule by mouth daily.     conjugated estrogens (PREMARIN) vaginal cream Place vaginally as directed. Use 2-3 times weekly  prnPlace 1 Applicatorful vaginally as directed. Use 2-3 times weekly  prn 42 g 4   ferrous sulfate 325 (65 FE) MG tablet Take 1 tablet (325 mg total) by mouth every other day. 15 tablet 11   gabapentin (NEURONTIN) 100 MG capsule TAKE 1 TO 3 CAPSULES BY MOUTH AT BEDTIME AS DIRECTED 300 capsule 2   latanoprost (XALATAN) 0.005 % ophthalmic solution SMARTSIG:In Eye(s)     levothyroxine (SYNTHROID) 75 MCG tablet TAKE 1 TABLET BY  MOUTH  DAILY 100 tablet 2   Magnesium 500 MG TABS Take 2 tablets by mouth daily.     metFORMIN (GLUCOPHAGE-XR) 500 MG 24 hr tablet TAKE 3 TABLETS BY MOUTH DAILY 300 tablet 2   pantoprazole (PROTONIX) 40 MG tablet TAKE 1 TABLET BY MOUTH IN  THE MORNING 90 tablet 3   pyridOXINE (VITAMIN B-6) 100 MG tablet Take 100 mg by mouth daily.     TURMERIC CURCUMIN PO Take 1 tablet by mouth daily.     valsartan-hydrochlorothiazide (DIOVAN-HCT) 320-25 MG tablet Take 1 tablet by mouth daily. Dosage change 90 tablet 1   Current Facility-Administered Medications  Medication Dose Route Frequency Provider Last Rate Last Admin   betamethasone acetate-betamethasone sodium phosphate (CELESTONE) injection 3 mg  3 mg Intramuscular Once Gala Lewandowsky M, DPM       betamethasone acetate-betamethasone sodium phosphate (CELESTONE) injection 3 mg  3 mg Intramuscular Once Felecia Shelling, DPM        Allergies as of 08/08/2022 - Review Complete 08/08/2022  Allergen Reaction Noted   Contrast media [iodinated contrast media] Shortness Of Breath 09/10/2014   Codeine Nausea Only 11/06/2014   Shrimp [shellfish allergy] Swelling 11/07/2014   Topamax [topiramate] Rash 03/04/2019      Physical Exam: General:   Alert,  well-nourished, pleasant and cooperative in NAD. No hiccups occurred during her evaluation today.  Head:  Normocephalic and atraumatic. Eyes:  Sclera clear, no icterus.   Conjunctiva pink. Abdomen:  Soft, nontender, nondistended, normal bowel sounds, no rebound or guarding. No hepatosplenomegaly.   Neurologic:  Alert and  oriented x4;  grossly nonfocal Skin:  Intact without significant lesions or rashes. Psych:  Alert and cooperative. Normal mood and affect.  I spent over 40 minutes, including in depth chart review, independent review of results, communicating results with the patient directly, face-to-face time with the patient, coordinating care, ordering studies and medications as appropriate, and  documentation.    L. Orvan Falconer, MD, MPH 08/08/2022, 2:04 PM

## 2022-08-09 ENCOUNTER — Telehealth: Payer: Self-pay

## 2022-08-09 NOTE — Telephone Encounter (Signed)
-----   Message from Lemar Lofty., MD sent at 08/09/2022  5:46 AM EST ----- KB, Happy to be available if it is contributing to her iron deficiency. With the prominent veins or potential varices, I would recommend updated cross-sectional imaging just so I am as prepared as possible if there are any issues. Defer to you in regards to liver protocol CT or MRI abdomen. Can certainly do colonoscopy at same time.  , Please proceed scheduling hospital-based EGD with EMR/colonoscopy 90-minute case total. Thanks. 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 I think they are contributing to her iron deficiency anemia.  She is also overdue a colonoscopy and agrees to proceed at the same time.  She knows you are booked until February/March and is prepared to wait. If too much, I can certainly refer her to [facility] - which I want to do for her hiccups, anyway.  Thanks.  KLB

## 2022-08-09 NOTE — Telephone Encounter (Signed)
Mansouraty, Netty Starring., MD  Tressia Danas, MD Cc: Lucky Cowboy, RN; Loretha Stapler, RN KB, Why don't you reply back after we know how she wants to proceed and then Alexia Freestone can work on scheduling if she agrees. GM

## 2022-08-10 DIAGNOSIS — Z1231 Encounter for screening mammogram for malignant neoplasm of breast: Secondary | ICD-10-CM | POA: Diagnosis not present

## 2022-08-10 LAB — HM MAMMOGRAPHY

## 2022-08-15 DIAGNOSIS — M9903 Segmental and somatic dysfunction of lumbar region: Secondary | ICD-10-CM | POA: Diagnosis not present

## 2022-08-15 DIAGNOSIS — M9905 Segmental and somatic dysfunction of pelvic region: Secondary | ICD-10-CM | POA: Diagnosis not present

## 2022-08-15 DIAGNOSIS — M9904 Segmental and somatic dysfunction of sacral region: Secondary | ICD-10-CM | POA: Diagnosis not present

## 2022-08-15 DIAGNOSIS — M5136 Other intervertebral disc degeneration, lumbar region: Secondary | ICD-10-CM | POA: Diagnosis not present

## 2022-08-16 NOTE — Telephone Encounter (Signed)
Dr Orvan Falconer please see the message from Dr Meridee Score.    "KB, Why don't you reply back after we know how she wants to proceed and then Alexia Freestone can work on scheduling if she agrees. GM"

## 2022-08-18 DIAGNOSIS — M9903 Segmental and somatic dysfunction of lumbar region: Secondary | ICD-10-CM | POA: Diagnosis not present

## 2022-08-18 DIAGNOSIS — M9904 Segmental and somatic dysfunction of sacral region: Secondary | ICD-10-CM | POA: Diagnosis not present

## 2022-08-18 DIAGNOSIS — M5136 Other intervertebral disc degeneration, lumbar region: Secondary | ICD-10-CM | POA: Diagnosis not present

## 2022-08-18 DIAGNOSIS — M9905 Segmental and somatic dysfunction of pelvic region: Secondary | ICD-10-CM | POA: Diagnosis not present

## 2022-08-19 ENCOUNTER — Encounter: Payer: Self-pay | Admitting: Internal Medicine

## 2022-08-22 ENCOUNTER — Other Ambulatory Visit: Payer: Self-pay | Admitting: Internal Medicine

## 2022-08-23 ENCOUNTER — Other Ambulatory Visit: Payer: Self-pay

## 2022-08-23 NOTE — Telephone Encounter (Signed)
Left message for patient to call back  

## 2022-08-23 NOTE — Telephone Encounter (Signed)
Spoke with patient regarding MD recommendations & she would like to move forward with scheduling and CT scan. Radiology number provided to patient if she has not heard from our schedulers within 1 week.

## 2022-08-24 ENCOUNTER — Other Ambulatory Visit: Payer: Self-pay

## 2022-08-24 DIAGNOSIS — M9903 Segmental and somatic dysfunction of lumbar region: Secondary | ICD-10-CM | POA: Diagnosis not present

## 2022-08-24 DIAGNOSIS — M9905 Segmental and somatic dysfunction of pelvic region: Secondary | ICD-10-CM | POA: Diagnosis not present

## 2022-08-24 DIAGNOSIS — M9904 Segmental and somatic dysfunction of sacral region: Secondary | ICD-10-CM | POA: Diagnosis not present

## 2022-08-24 DIAGNOSIS — M5136 Other intervertebral disc degeneration, lumbar region: Secondary | ICD-10-CM | POA: Diagnosis not present

## 2022-08-25 ENCOUNTER — Other Ambulatory Visit: Payer: Self-pay

## 2022-08-25 DIAGNOSIS — D509 Iron deficiency anemia, unspecified: Secondary | ICD-10-CM

## 2022-08-25 DIAGNOSIS — I8500 Esophageal varices without bleeding: Secondary | ICD-10-CM

## 2022-08-25 NOTE — Telephone Encounter (Signed)
MRI abdomen without contrast ordered & schedulers notified.

## 2022-08-30 DIAGNOSIS — M9905 Segmental and somatic dysfunction of pelvic region: Secondary | ICD-10-CM | POA: Diagnosis not present

## 2022-08-30 DIAGNOSIS — M9903 Segmental and somatic dysfunction of lumbar region: Secondary | ICD-10-CM | POA: Diagnosis not present

## 2022-08-30 DIAGNOSIS — M5136 Other intervertebral disc degeneration, lumbar region: Secondary | ICD-10-CM | POA: Diagnosis not present

## 2022-08-30 DIAGNOSIS — M9904 Segmental and somatic dysfunction of sacral region: Secondary | ICD-10-CM | POA: Diagnosis not present

## 2022-09-14 ENCOUNTER — Other Ambulatory Visit: Payer: Self-pay

## 2022-09-14 ENCOUNTER — Ambulatory Visit: Payer: Medicare Other | Admitting: Internal Medicine

## 2022-09-14 ENCOUNTER — Ambulatory Visit (HOSPITAL_COMMUNITY)
Admission: RE | Admit: 2022-09-14 | Discharge: 2022-09-14 | Disposition: A | Discharge status: Home / Self Care | Payer: Medicare Other | Source: Ambulatory Visit | Attending: Gastroenterology | Admitting: Gastroenterology

## 2022-09-14 ENCOUNTER — Telehealth: Payer: Self-pay

## 2022-09-14 DIAGNOSIS — D509 Iron deficiency anemia, unspecified: Secondary | ICD-10-CM

## 2022-09-14 DIAGNOSIS — I8500 Esophageal varices without bleeding: Secondary | ICD-10-CM

## 2022-09-14 NOTE — Telephone Encounter (Signed)
Spoke with patient & made her aware of MRI cancellation and discussed upcoming CT. Pt verbalized all instructions provided to her.

## 2022-09-14 NOTE — Telephone Encounter (Signed)
Per radiologist, MRI should be cancelled & a CT abdomen WO contrast (oral only since it is barium) should be ordered to provide better imaging. MRI for today has been cancelled. Left message for patient to call back. CT scheduled for 09/22/22 at 4:00 pm. Patient will need to be NPO 4 hours prior & arrive a little before 2:00 pm to check in and start oral contrast.

## 2022-09-14 NOTE — Telephone Encounter (Signed)
PT returning call. Please advise. 

## 2022-09-14 NOTE — Progress Notes (Signed)
Spoke with office, then consulted with Body radiologist. CT abd without iv contrast. Oral contrast only will give sufficient information for this pt and her symptoms. Due to pt's abundant peritoneal fat CT will be the better modality of choice. MRI abd without will not give sufficient information for this pt and her symptoms. Spoke with Enis Gash at office and relayed all of this information.

## 2022-09-14 NOTE — Telephone Encounter (Signed)
Received call from [facility] with radiology with questions in regards to the MRI that patient is scheduled for today. He is going to discuss further with radiologist to see if MRI will be appropriate for the purpose of the scan & will let me know if we need to adjust orders. My direct number provided.

## 2022-09-20 DIAGNOSIS — M9903 Segmental and somatic dysfunction of lumbar region: Secondary | ICD-10-CM | POA: Diagnosis not present

## 2022-09-20 DIAGNOSIS — M9905 Segmental and somatic dysfunction of pelvic region: Secondary | ICD-10-CM | POA: Diagnosis not present

## 2022-09-20 DIAGNOSIS — M9904 Segmental and somatic dysfunction of sacral region: Secondary | ICD-10-CM | POA: Diagnosis not present

## 2022-09-20 DIAGNOSIS — M5136 Other intervertebral disc degeneration, lumbar region: Secondary | ICD-10-CM | POA: Diagnosis not present

## 2022-09-22 ENCOUNTER — Ambulatory Visit (HOSPITAL_COMMUNITY)
Admission: RE | Admit: 2022-09-22 | Discharge: 2022-09-22 | Disposition: A | Discharge status: Home / Self Care | Payer: Medicare Other | Source: Ambulatory Visit | Attending: Gastroenterology | Admitting: Gastroenterology

## 2022-09-22 DIAGNOSIS — I700 Atherosclerosis of aorta: Secondary | ICD-10-CM | POA: Diagnosis not present

## 2022-09-22 DIAGNOSIS — D509 Iron deficiency anemia, unspecified: Secondary | ICD-10-CM | POA: Insufficient documentation

## 2022-09-22 DIAGNOSIS — K8689 Other specified diseases of pancreas: Secondary | ICD-10-CM | POA: Diagnosis not present

## 2022-09-22 DIAGNOSIS — I8500 Esophageal varices without bleeding: Secondary | ICD-10-CM | POA: Diagnosis not present

## 2022-09-22 DIAGNOSIS — K449 Diaphragmatic hernia without obstruction or gangrene: Secondary | ICD-10-CM | POA: Diagnosis not present

## 2022-09-26 ENCOUNTER — Encounter: Payer: Self-pay | Admitting: Family

## 2022-09-26 ENCOUNTER — Ambulatory Visit (INDEPENDENT_AMBULATORY_CARE_PROVIDER_SITE_OTHER): Payer: Medicare Other | Admitting: Family

## 2022-09-26 VITALS — BP 178/80 | HR 79 | Temp 97.8°F | Ht 61.5 in | Wt 150.0 lb

## 2022-09-26 DIAGNOSIS — I10 Essential (primary) hypertension: Secondary | ICD-10-CM | POA: Diagnosis not present

## 2022-09-26 DIAGNOSIS — R7309 Other abnormal glucose: Secondary | ICD-10-CM | POA: Diagnosis not present

## 2022-09-26 DIAGNOSIS — E782 Mixed hyperlipidemia: Secondary | ICD-10-CM

## 2022-09-26 DIAGNOSIS — M9903 Segmental and somatic dysfunction of lumbar region: Secondary | ICD-10-CM | POA: Diagnosis not present

## 2022-09-26 DIAGNOSIS — K219 Gastro-esophageal reflux disease without esophagitis: Secondary | ICD-10-CM | POA: Diagnosis not present

## 2022-09-26 DIAGNOSIS — M5136 Other intervertebral disc degeneration, lumbar region: Secondary | ICD-10-CM | POA: Diagnosis not present

## 2022-09-26 DIAGNOSIS — M9905 Segmental and somatic dysfunction of pelvic region: Secondary | ICD-10-CM | POA: Diagnosis not present

## 2022-09-26 DIAGNOSIS — E039 Hypothyroidism, unspecified: Secondary | ICD-10-CM | POA: Diagnosis not present

## 2022-09-26 DIAGNOSIS — M9904 Segmental and somatic dysfunction of sacral region: Secondary | ICD-10-CM | POA: Diagnosis not present

## 2022-09-26 LAB — POCT GLYCOSYLATED HEMOGLOBIN (HGB A1C): Hemoglobin A1C: 6 % — AB (ref 4.0–5.6)

## 2022-09-27 ENCOUNTER — Telehealth: Payer: Self-pay | Admitting: Gastroenterology

## 2022-09-27 NOTE — Progress Notes (Signed)
Established Patient Office Visit  Subjective   Patient ID: Kerry Walters, female    DOB: [birth year]  Age: 77 y.o. MRN: [id number]  Chief Complaint  Patient presents with  . Diabetes  . Hypertension    HPI  77 year old very pleasant female patient of Dr. Regis Bill presents today for a recheck of her chronic conditions. She admits to elevated blood pressure readings at home but is nice and firm about not taking any additional medications for treatment (accepting the risk). She typically runs in the 443'X systolically. No routine exercise or special diet. Attends church.   Review of Systems  All other systems reviewed and are negative. Past Medical History:  Diagnosis Date  . Allergy   . Asthma due to environmental allergies    no inhaler  . Diabetes mellitus without complication ([place])   . Diverticulosis of colon   . GERD (gastroesophageal reflux disease)   . History of esophageal dilatation    FOR STRICTURE IN 2005  . History of hiatal hernia   . History of hypercalcemia    SECONDARY TO HYPERPARATHYROID  . History of hyperparathyroidism    PRIMARY--  S/P RIGHT PARATHYROIDECTOMY  . History of kidney stones   . History of recurrent UTIs   . Hyperlipidemia   . Hypertension   . Hypothyroidism   . Incomplete right bundle branch block   . Iron deficiency anemia due to chronic blood loss 12/01/2021  . Nephrolithiasis    RIGHT   . Nocturia   . PONV (postoperative nausea and vomiting)   . Pre-diabetes   . Sciatica of right side     Social History   Socioeconomic History  . Marital status: Married    Spouse name: Not on file  . Number of children: Not on file  . Years of education: Not on file  . Highest education level: Not on file  Occupational History  . Not on file  Tobacco Use  . Smoking status: Never  . Smokeless tobacco: Never  Vaping Use  . Vaping Use: Never used  Substance and Sexual Activity  . Alcohol use: No  . Drug use: No  . Sexual activity: Not on  file  Other Topics Concern  . Not on file  Social History Narrative   Married   hh of 2   4 dogs   G2 P2   Water exercise.   Retired    [employer]    great grandchild          Social Determinants of Radio broadcast assistant Strain: [place]  (03/24/2020)   Overall Financial Resource Strain (CARDIA)   . Difficulty of Paying Living Expenses: Not hard at all  Food Insecurity: Not on file  Transportation Needs: No Transportation Needs (03/24/2020)   PRAPARE - Transportation   . Lack of Transportation (Medical): No   . Lack of Transportation (Non-Medical): No  Physical Activity: Not on file  Stress: Not on file  Social Connections: Not on file  Intimate Partner Violence: Not on file    Past Surgical History:  Procedure Laterality Date  . APPENDECTOMY  1974  . CYSTOSCOPY WITH URETEROSCOPY AND STENT PLACEMENT Right 11/11/2014   Procedure: CYSTOSCOPY WITH URETEROSCOPY AND STENT PLACEMENT;  Surgeon: Ardis Hughs, MD;  Location: [facility];  Service: Urology;  Laterality: Right;  . EXCISION URETHRAL CARUNCLE  1981   Open procedure  . KNEE ARTHROSCOPY Left april 2010  . REMOVAL RIP  1973  First Rib on Right side-- for impingement  . RIGHT INFERIOR PARATHYROIDECTOMY, MINIMALLY INVASIVE  08-14-2008  . TONSILLECTOMY  as child  . TOTAL KNEE ARTHROPLASTY Left 11-17-2009  . TUBAL LIGATION  1973  . VAGINAL HYSTERECTOMY  1995   w/ Bilateral Salpingoophorectomy    Family History  Problem Relation Age of Onset  . Cancer Father        brain  . Cancer Son        liver  . Diabetes Sister   . Schizophrenia Son   . Colon cancer Neg Hx   . Colon polyps Neg Hx   . Esophageal cancer Neg Hx   . Stomach cancer Neg Hx   . Pancreatic cancer Neg Hx   . Rectal cancer Neg Hx     Allergies  Allergen Reactions  . Contrast Media [Iodinated Contrast Media] Shortness Of Breath    Pt states in the 80's at Urologist visit had asthma attack reaction to IVP dye. Told not to  have it again.   . Codeine Nausea Only  . Shrimp [Shellfish Allergy] Swelling    Only Shrimp  . Topamax [Topiramate] Rash    Current Outpatient Medications on File Prior to Visit  Medication Sig Dispense Refill  . atorvastatin (LIPITOR) 20 MG tablet TAKE 1 TABLET BY MOUTH IN THE  EVENING 100 tablet 2  . cholecalciferol (VITAMIN D3) 25 MCG (1000 UNIT) tablet Take 1,000 Units by mouth daily.    . Coenzyme Q10 (COQ-10) 400 MG CAPS Take 1 capsule by mouth daily.    [name] conjugated estrogens (PREMARIN) vaginal cream Place vaginally as directed. Use 2-3 times weekly  prnPlace 1 Applicatorful vaginally as directed. Use 2-3 times weekly  prn 42 g 4  . ferrous sulfate 325 (65 FE) MG tablet Take 1 tablet (325 mg total) by mouth every other day. 15 tablet 11  . gabapentin (NEURONTIN) 100 MG capsule TAKE 1 TO 3 CAPSULES BY MOUTH AT BEDTIME AS DIRECTED 300 capsule 2  . latanoprost (XALATAN) 0.005 % ophthalmic solution SMARTSIG:In Eye(s)    . levothyroxine (SYNTHROID) 75 MCG tablet TAKE 1 TABLET BY MOUTH  DAILY 100 tablet 2  . Magnesium 500 MG TABS Take 2 tablets by mouth daily.    . metFORMIN (GLUCOPHAGE-XR) 500 MG 24 hr tablet TAKE 3 TABLETS BY MOUTH DAILY 300 tablet 2  . pantoprazole (PROTONIX) 40 MG tablet TAKE 1 TABLET BY MOUTH IN  THE MORNING 90 tablet 3  . pyridOXINE (VITAMIN B-6) 100 MG tablet Take 100 mg by mouth daily.    . TURMERIC CURCUMIN PO Take 1 tablet by mouth daily.    . valsartan-hydrochlorothiazide (DIOVAN-HCT) 320-25 MG tablet TAKE 1 TABLET BY MOUTH DAILY .  DOSAGE CHANGE 100 tablet 1   Current Facility-Administered Medications on File Prior to Visit  Medication Dose Route Frequency Provider Last Rate Last Admin  . betamethasone acetate-betamethasone sodium phosphate (CELESTONE) injection 3 mg  3 mg Intramuscular Once Daylene Katayama M, DPM      . betamethasone acetate-betamethasone sodium phosphate (CELESTONE) injection 3 mg  3 mg Intramuscular Once Evans, Brent M, DPM        BP (!)  178/80   Pulse 79   Temp 97.8 F (36.6 C) (Oral)   Ht 5' 1.5" (1.562 m)   Wt 150 lb (68 kg)   SpO2 99%   BMI 27.88 kg/m chart    Objective:     BP (!) 178/80   Pulse 79   Temp 97.8 F (36.6  C) (Oral)   Ht 5' 1.5" (1.562 m)   Wt 150 lb (68 kg)   SpO2 99%   BMI 27.88 kg/m    Physical Exam Vitals and nursing note reviewed.  Constitutional:      Appearance: Normal appearance. She is normal weight.  HENT:     Right Ear: Tympanic membrane, ear canal and external ear normal.     Left Ear: Tympanic membrane, ear canal and external ear normal.  Cardiovascular:     Rate and Rhythm: Normal rate and regular rhythm.     Pulses: Normal pulses.     Heart sounds: Normal heart sounds.     Comments: Blood pressure 180/84 Pulmonary:     Effort: Pulmonary effort is normal.     Breath sounds: Normal breath sounds.  Abdominal:     General: Abdomen is flat. Bowel sounds are normal.     Palpations: Abdomen is soft.  Musculoskeletal:        General: Normal range of motion.  Skin:    General: Skin is warm and dry.  Neurological:     General: No focal deficit present.     Mental Status: She is alert and oriented to person, place, and time.  Psychiatric:        Mood and Affect: Mood normal.        Behavior: Behavior normal.    Results for orders placed or performed in visit on 09/26/22  POC HgB A1c  Result Value Ref Range   Hemoglobin A1C 6.0 (A) 4.0 - 5.6 %   HbA1c POC (<> result, manual entry)     HbA1c, POC (prediabetic range)     HbA1c, POC (controlled diabetic range)        The 10-year ASCVD risk score (Arnett DK, et al., 2019) is: 71%    Assessment & Plan:   Problem List Items Addressed This Visit     Hypothyroidism   HYPERLIPIDEMIA   HYPERGLYCEMIA, FASTING - Primary   Relevant Orders   POC HgB A1c (Completed)   GERD   Essential hypertension   I am concerned about her blood pressure running as high as it is. Patient is aware of the risk of CVA, MI, and death.  Will continue current medication. Recheck in 3-4 months.  Return in about 4 months (around 01/25/2023).    Kennyth Arnold, FNP

## 2022-09-27 NOTE — Telephone Encounter (Unsigned)
Unable to reach pt. Phone rings and rings with no answering machine

## 2022-09-27 NOTE — Telephone Encounter (Signed)
Patient returning Steven's call in regards to CT results. Please advise.

## 2022-09-28 ENCOUNTER — Ambulatory Visit: Payer: Medicare Other | Admitting: Internal Medicine

## 2022-09-28 NOTE — Telephone Encounter (Signed)
Left message for pt to call back  °

## 2022-09-28 NOTE — Telephone Encounter (Signed)
Patient returned call

## 2022-09-29 DIAGNOSIS — M9905 Segmental and somatic dysfunction of pelvic region: Secondary | ICD-10-CM | POA: Diagnosis not present

## 2022-09-29 DIAGNOSIS — M9904 Segmental and somatic dysfunction of sacral region: Secondary | ICD-10-CM | POA: Diagnosis not present

## 2022-09-29 DIAGNOSIS — M9903 Segmental and somatic dysfunction of lumbar region: Secondary | ICD-10-CM | POA: Diagnosis not present

## 2022-09-29 DIAGNOSIS — M5136 Other intervertebral disc degeneration, lumbar region: Secondary | ICD-10-CM | POA: Diagnosis not present

## 2022-09-29 NOTE — Telephone Encounter (Signed)
Spoke with Pt. Documented under result notes:  Pt verbalized understanding with all questions answered.   

## 2022-09-29 NOTE — Telephone Encounter (Signed)
Left message for pt to call back  °

## 2022-10-05 DIAGNOSIS — M5136 Other intervertebral disc degeneration, lumbar region: Secondary | ICD-10-CM | POA: Diagnosis not present

## 2022-10-05 DIAGNOSIS — M9904 Segmental and somatic dysfunction of sacral region: Secondary | ICD-10-CM | POA: Diagnosis not present

## 2022-10-05 DIAGNOSIS — M9903 Segmental and somatic dysfunction of lumbar region: Secondary | ICD-10-CM | POA: Diagnosis not present

## 2022-10-05 DIAGNOSIS — M9905 Segmental and somatic dysfunction of pelvic region: Secondary | ICD-10-CM | POA: Diagnosis not present

## 2022-10-13 DIAGNOSIS — K449 Diaphragmatic hernia without obstruction or gangrene: Secondary | ICD-10-CM | POA: Diagnosis not present

## 2022-10-13 DIAGNOSIS — R066 Hiccough: Secondary | ICD-10-CM | POA: Diagnosis not present

## 2022-10-18 DIAGNOSIS — M5136 Other intervertebral disc degeneration, lumbar region: Secondary | ICD-10-CM | POA: Diagnosis not present

## 2022-10-18 DIAGNOSIS — M9903 Segmental and somatic dysfunction of lumbar region: Secondary | ICD-10-CM | POA: Diagnosis not present

## 2022-10-18 DIAGNOSIS — M9904 Segmental and somatic dysfunction of sacral region: Secondary | ICD-10-CM | POA: Diagnosis not present

## 2022-10-18 DIAGNOSIS — M9905 Segmental and somatic dysfunction of pelvic region: Secondary | ICD-10-CM | POA: Diagnosis not present

## 2022-10-25 DIAGNOSIS — M5136 Other intervertebral disc degeneration, lumbar region: Secondary | ICD-10-CM | POA: Diagnosis not present

## 2022-10-25 DIAGNOSIS — M9905 Segmental and somatic dysfunction of pelvic region: Secondary | ICD-10-CM | POA: Diagnosis not present

## 2022-10-25 DIAGNOSIS — M9903 Segmental and somatic dysfunction of lumbar region: Secondary | ICD-10-CM | POA: Diagnosis not present

## 2022-10-25 DIAGNOSIS — M9904 Segmental and somatic dysfunction of sacral region: Secondary | ICD-10-CM | POA: Diagnosis not present

## 2022-10-31 DIAGNOSIS — M9903 Segmental and somatic dysfunction of lumbar region: Secondary | ICD-10-CM | POA: Diagnosis not present

## 2022-10-31 DIAGNOSIS — M9904 Segmental and somatic dysfunction of sacral region: Secondary | ICD-10-CM | POA: Diagnosis not present

## 2022-10-31 DIAGNOSIS — M5136 Other intervertebral disc degeneration, lumbar region: Secondary | ICD-10-CM | POA: Diagnosis not present

## 2022-10-31 DIAGNOSIS — M9905 Segmental and somatic dysfunction of pelvic region: Secondary | ICD-10-CM | POA: Diagnosis not present

## 2022-11-07 DIAGNOSIS — M5136 Other intervertebral disc degeneration, lumbar region: Secondary | ICD-10-CM | POA: Diagnosis not present

## 2022-11-07 DIAGNOSIS — M9905 Segmental and somatic dysfunction of pelvic region: Secondary | ICD-10-CM | POA: Diagnosis not present

## 2022-11-07 DIAGNOSIS — M9904 Segmental and somatic dysfunction of sacral region: Secondary | ICD-10-CM | POA: Diagnosis not present

## 2022-11-07 DIAGNOSIS — M9903 Segmental and somatic dysfunction of lumbar region: Secondary | ICD-10-CM | POA: Diagnosis not present

## 2022-11-09 DIAGNOSIS — M9905 Segmental and somatic dysfunction of pelvic region: Secondary | ICD-10-CM | POA: Diagnosis not present

## 2022-11-09 DIAGNOSIS — M9904 Segmental and somatic dysfunction of sacral region: Secondary | ICD-10-CM | POA: Diagnosis not present

## 2022-11-09 DIAGNOSIS — M5136 Other intervertebral disc degeneration, lumbar region: Secondary | ICD-10-CM | POA: Diagnosis not present

## 2022-11-09 DIAGNOSIS — M9903 Segmental and somatic dysfunction of lumbar region: Secondary | ICD-10-CM | POA: Diagnosis not present

## 2022-11-14 DIAGNOSIS — M9905 Segmental and somatic dysfunction of pelvic region: Secondary | ICD-10-CM | POA: Diagnosis not present

## 2022-11-14 DIAGNOSIS — M9904 Segmental and somatic dysfunction of sacral region: Secondary | ICD-10-CM | POA: Diagnosis not present

## 2022-11-14 DIAGNOSIS — M5136 Other intervertebral disc degeneration, lumbar region: Secondary | ICD-10-CM | POA: Diagnosis not present

## 2022-11-14 DIAGNOSIS — M9903 Segmental and somatic dysfunction of lumbar region: Secondary | ICD-10-CM | POA: Diagnosis not present

## 2022-11-21 DIAGNOSIS — M5136 Other intervertebral disc degeneration, lumbar region: Secondary | ICD-10-CM | POA: Diagnosis not present

## 2022-11-21 DIAGNOSIS — M9903 Segmental and somatic dysfunction of lumbar region: Secondary | ICD-10-CM | POA: Diagnosis not present

## 2022-11-21 DIAGNOSIS — M9904 Segmental and somatic dysfunction of sacral region: Secondary | ICD-10-CM | POA: Diagnosis not present

## 2022-11-21 DIAGNOSIS — M9905 Segmental and somatic dysfunction of pelvic region: Secondary | ICD-10-CM | POA: Diagnosis not present

## 2022-11-23 DIAGNOSIS — M9904 Segmental and somatic dysfunction of sacral region: Secondary | ICD-10-CM | POA: Diagnosis not present

## 2022-11-23 DIAGNOSIS — M5136 Other intervertebral disc degeneration, lumbar region: Secondary | ICD-10-CM | POA: Diagnosis not present

## 2022-11-23 DIAGNOSIS — M9905 Segmental and somatic dysfunction of pelvic region: Secondary | ICD-10-CM | POA: Diagnosis not present

## 2022-11-23 DIAGNOSIS — M9903 Segmental and somatic dysfunction of lumbar region: Secondary | ICD-10-CM | POA: Diagnosis not present

## 2022-12-01 DIAGNOSIS — M9905 Segmental and somatic dysfunction of pelvic region: Secondary | ICD-10-CM | POA: Diagnosis not present

## 2022-12-01 DIAGNOSIS — M9903 Segmental and somatic dysfunction of lumbar region: Secondary | ICD-10-CM | POA: Diagnosis not present

## 2022-12-01 DIAGNOSIS — M9904 Segmental and somatic dysfunction of sacral region: Secondary | ICD-10-CM | POA: Diagnosis not present

## 2022-12-01 DIAGNOSIS — M5136 Other intervertebral disc degeneration, lumbar region: Secondary | ICD-10-CM | POA: Diagnosis not present

## 2022-12-05 DIAGNOSIS — M9905 Segmental and somatic dysfunction of pelvic region: Secondary | ICD-10-CM | POA: Diagnosis not present

## 2022-12-05 DIAGNOSIS — M9904 Segmental and somatic dysfunction of sacral region: Secondary | ICD-10-CM | POA: Diagnosis not present

## 2022-12-05 DIAGNOSIS — M9903 Segmental and somatic dysfunction of lumbar region: Secondary | ICD-10-CM | POA: Diagnosis not present

## 2022-12-05 DIAGNOSIS — M5136 Other intervertebral disc degeneration, lumbar region: Secondary | ICD-10-CM | POA: Diagnosis not present

## 2022-12-06 ENCOUNTER — Telehealth: Payer: Self-pay

## 2022-12-06 NOTE — Telephone Encounter (Signed)
, Please proceed scheduling hospital-based EGD with EMR/colonoscopy 90-minute case total. Thanks. GM

## 2022-12-06 NOTE — Telephone Encounter (Signed)
-----   Message from Tressia Danas, MD sent at 12/06/2022  1:01 PM EDT ----- Please obtain an update from the patient re: scheduling EGD with EMR and colonoscopy. Dr. Meridee Score is available and willing to help.  Thanks.  KLB ----- Message ----- From: Lemar Lofty., MD Sent: 08/09/2022   5:48 AM EDT To: Loretha Stapler, RN; Tressia Danas, MD; #  KB, Happy to be available if it is contributing to her iron deficiency. With the prominent veins or potential varices, I would recommend updated cross-sectional imaging just so I am as prepared as possible if there are any issues. Defer to you in regards to liver protocol CT or MRI abdomen. Can certainly do colonoscopy at same time.  , Please proceed scheduling hospital-based EGD with EMR/colonoscopy 90-minute case total. Thanks. 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 I think they are contributing to her iron deficiency anemia.  She is also overdue a colonoscopy and agrees to proceed at the same time.  She knows you are booked until February/March and is prepared to wait. If too much, I can certainly refer her to [facility] - which I want to do for her hiccups, anyway.  Thanks.  KLB

## 2022-12-07 ENCOUNTER — Other Ambulatory Visit (HOSPITAL_BASED_OUTPATIENT_CLINIC_OR_DEPARTMENT_OTHER): Payer: Self-pay

## 2022-12-07 ENCOUNTER — Other Ambulatory Visit: Payer: Self-pay

## 2022-12-07 DIAGNOSIS — Z1211 Encounter for screening for malignant neoplasm of colon: Secondary | ICD-10-CM

## 2022-12-07 DIAGNOSIS — I8500 Esophageal varices without bleeding: Secondary | ICD-10-CM

## 2022-12-07 DIAGNOSIS — K317 Polyp of stomach and duodenum: Secondary | ICD-10-CM

## 2022-12-07 DIAGNOSIS — D509 Iron deficiency anemia, unspecified: Secondary | ICD-10-CM

## 2022-12-07 MED ORDER — NA SULFATE-K SULFATE-MG SULF 17.5-3.13-1.6 GM/177ML PO SOLN
1.0000 | Freq: Once | ORAL | 0 refills | Status: AC
  Filled 2022-12-07: qty 354, 1d supply, fill #0

## 2022-12-07 NOTE — Telephone Encounter (Signed)
Colon EMR EGD has been scheduled for 01/12/23 at 1030 am at [facility] with GM   Left message on machine to call back

## 2022-12-08 NOTE — Telephone Encounter (Signed)
Left message on machine to call back  

## 2022-12-08 NOTE — Telephone Encounter (Signed)
Line busy

## 2022-12-09 NOTE — Telephone Encounter (Signed)
Left message on machine to call back  

## 2022-12-12 ENCOUNTER — Other Ambulatory Visit: Payer: Self-pay

## 2022-12-12 MED ORDER — NA SULFATE-K SULFATE-MG SULF 17.5-3.13-1.6 GM/177ML PO SOLN: 1.0000 | Freq: Once | ORAL | 0 refills | Status: AC

## 2022-12-12 NOTE — Telephone Encounter (Signed)
The pt has been advised of the appt for colon EMR EGD.  Prep sent.  All questions answered to the best of my ability.  All information has been mailed to the pt home address

## 2022-12-13 DIAGNOSIS — M5136 Other intervertebral disc degeneration, lumbar region: Secondary | ICD-10-CM | POA: Diagnosis not present

## 2022-12-13 DIAGNOSIS — M9905 Segmental and somatic dysfunction of pelvic region: Secondary | ICD-10-CM | POA: Diagnosis not present

## 2022-12-13 DIAGNOSIS — M9903 Segmental and somatic dysfunction of lumbar region: Secondary | ICD-10-CM | POA: Diagnosis not present

## 2022-12-13 DIAGNOSIS — M9904 Segmental and somatic dysfunction of sacral region: Secondary | ICD-10-CM | POA: Diagnosis not present

## 2022-12-15 ENCOUNTER — Other Ambulatory Visit (HOSPITAL_BASED_OUTPATIENT_CLINIC_OR_DEPARTMENT_OTHER): Payer: Self-pay

## 2022-12-20 DIAGNOSIS — M9905 Segmental and somatic dysfunction of pelvic region: Secondary | ICD-10-CM | POA: Diagnosis not present

## 2022-12-20 DIAGNOSIS — M9903 Segmental and somatic dysfunction of lumbar region: Secondary | ICD-10-CM | POA: Diagnosis not present

## 2022-12-20 DIAGNOSIS — M5136 Other intervertebral disc degeneration, lumbar region: Secondary | ICD-10-CM | POA: Diagnosis not present

## 2022-12-20 DIAGNOSIS — M9904 Segmental and somatic dysfunction of sacral region: Secondary | ICD-10-CM | POA: Diagnosis not present

## 2022-12-23 ENCOUNTER — Telehealth: Payer: Self-pay | Admitting: Gastroenterology

## 2022-12-23 NOTE — Telephone Encounter (Signed)
The pt has concerns about her "weak bladder" she states she does not want to urinate on anyone during her colonoscopy.  I have tried to ease her mind and  let her know she will have the opportunity to use the restroom before and after the procedure and to make the nurses aware when she arrives of her concerns.  The pt has been advised of the information and verbalized understanding.   She thanked me for make her less anxious.

## 2022-12-23 NOTE — Telephone Encounter (Signed)
Patient called and stated she has questions about next months colonoscopy. Would like a call back. Thank you.

## 2022-12-26 DIAGNOSIS — M9903 Segmental and somatic dysfunction of lumbar region: Secondary | ICD-10-CM | POA: Diagnosis not present

## 2022-12-26 DIAGNOSIS — M9904 Segmental and somatic dysfunction of sacral region: Secondary | ICD-10-CM | POA: Diagnosis not present

## 2022-12-26 DIAGNOSIS — M5136 Other intervertebral disc degeneration, lumbar region: Secondary | ICD-10-CM | POA: Diagnosis not present

## 2022-12-26 DIAGNOSIS — M9905 Segmental and somatic dysfunction of pelvic region: Secondary | ICD-10-CM | POA: Diagnosis not present

## 2022-12-28 ENCOUNTER — Ambulatory Visit (INDEPENDENT_AMBULATORY_CARE_PROVIDER_SITE_OTHER): Payer: Medicare Other | Admitting: Internal Medicine

## 2022-12-28 ENCOUNTER — Encounter: Payer: Self-pay | Admitting: Internal Medicine

## 2022-12-28 VITALS — BP 166/92 | HR 78 | Temp 97.9°F | Ht 61.5 in | Wt 148.0 lb

## 2022-12-28 DIAGNOSIS — M5136 Other intervertebral disc degeneration, lumbar region: Secondary | ICD-10-CM | POA: Diagnosis not present

## 2022-12-28 DIAGNOSIS — E039 Hypothyroidism, unspecified: Secondary | ICD-10-CM

## 2022-12-28 DIAGNOSIS — Z79899 Other long term (current) drug therapy: Secondary | ICD-10-CM | POA: Diagnosis not present

## 2022-12-28 DIAGNOSIS — R7309 Other abnormal glucose: Secondary | ICD-10-CM

## 2022-12-28 DIAGNOSIS — M9903 Segmental and somatic dysfunction of lumbar region: Secondary | ICD-10-CM | POA: Diagnosis not present

## 2022-12-28 DIAGNOSIS — I10 Essential (primary) hypertension: Secondary | ICD-10-CM | POA: Diagnosis not present

## 2022-12-28 DIAGNOSIS — M9905 Segmental and somatic dysfunction of pelvic region: Secondary | ICD-10-CM | POA: Diagnosis not present

## 2022-12-28 DIAGNOSIS — M9904 Segmental and somatic dysfunction of sacral region: Secondary | ICD-10-CM | POA: Diagnosis not present

## 2022-12-28 LAB — POCT GLYCOSYLATED HEMOGLOBIN (HGB A1C): Hemoglobin A1C: 6.1 % — AB (ref 4.0–5.6)

## 2022-12-28 MED ORDER — AMLODIPINE BESYLATE 2.5 MG PO TABS
2.5000 mg | ORAL_TABLET | Freq: Every day | ORAL | 1 refills | Status: DC
Start: 2022-12-28 — End: 2023-01-26

## 2022-12-28 NOTE — Progress Notes (Signed)
Chief Complaint  Patient presents with   Medical Management of Chronic Issues    Follow on BP. Checking A1c.     HPI: Kerry Walters 77 y.o. come in for Chronic disease management  see  visit from jan PWebb in my absence  BP was up she is not worried and has no sx of such   BP was quite high   no med adjusted as she felt not needed except in diovan hctz  in December?   A1c was  been 6 range  but elevated  at times  GI on going hiccoughs and fu with dr Orvan Falconer( who is leaving and she has new GI team)  noted to have HH and gastric polyps and  concern for early  without bleeding colon tobd this month  decided  no surgery  To have colon this month  Generally is well  ROS: See pertinent positives and negatives per HPI.has a "weak bladder"  Past Medical History:  Diagnosis Date   Allergy    Asthma due to environmental allergies    no inhaler   Diabetes mellitus without complication ([facility])    Diverticulosis of colon    GERD (gastroesophageal reflux disease)    History of esophageal dilatation    FOR STRICTURE IN 2005   History of hiatal hernia    History of hypercalcemia    SECONDARY TO HYPERPARATHYROID   History of hyperparathyroidism    PRIMARY--  S/P RIGHT PARATHYROIDECTOMY   History of kidney stones    History of recurrent UTIs    Hyperlipidemia    Hypertension    Hypothyroidism    Incomplete right bundle branch block    Iron deficiency anemia due to chronic blood loss 12/01/2021   Nephrolithiasis    RIGHT    Nocturia    PONV (postoperative nausea and vomiting)    Pre-diabetes    Sciatica of right side     Family History  Problem Relation Age of Onset   Cancer Father        brain   Cancer Son        liver   Diabetes Sister    Schizophrenia Son    Colon cancer Neg Hx    Colon polyps Neg Hx    Esophageal cancer Neg Hx    Stomach cancer Neg Hx    Pancreatic cancer Neg Hx    Rectal cancer Neg Hx     Social History   Socioeconomic History   Marital status:  Married    Spouse name: Not on file   Number of children: Not on file   Years of education: Not on file   Highest education level: Not on file  Occupational History   Not on file  Tobacco Use   Smoking status: Never   Smokeless tobacco: Never  Vaping Use   Vaping Use: Never used  Substance and Sexual Activity   Alcohol use: No   Drug use: No   Sexual activity: Not on file  Other Topics Concern   Not on file  Social History Narrative   Married   hh of 2   4 dogs   G2 P2   Water exercise.   Retired    [employer]    great grandchild          Social Determinants of Corporate investment banker Strain: Low Risk  (03/24/2020)   Overall Financial Resource Strain (CARDIA)    Difficulty of Paying Living Expenses: Not hard at  all  Food Insecurity: Not on file  Transportation Needs: No Transportation Needs (03/24/2020)   PRAPARE - Administrator, Civil Service (Medical): No    Lack of Transportation (Non-Medical): No  Physical Activity: Not on file  Stress: Not on file  Social Connections: Not on file    Outpatient Medications Prior to Visit  Medication Sig Dispense Refill   atorvastatin (LIPITOR) 20 MG tablet TAKE 1 TABLET BY MOUTH IN THE  EVENING 100 tablet 2   cholecalciferol (VITAMIN D3) 25 MCG (1000 UNIT) tablet Take 1,000 Units by mouth daily.     Coenzyme Q10 (COQ-10) 400 MG CAPS Take 1 capsule by mouth daily.     conjugated estrogens (PREMARIN) vaginal cream Place vaginally as directed. Use 2-3 times weekly  prnPlace 1 Applicatorful vaginally as directed. Use 2-3 times weekly  prn 42 g 4   ferrous sulfate 325 (65 FE) MG tablet Take 1 tablet (325 mg total) by mouth every other day. 15 tablet 11   gabapentin (NEURONTIN) 100 MG capsule TAKE 1 TO 3 CAPSULES BY MOUTH AT BEDTIME AS DIRECTED 300 capsule 2   latanoprost (XALATAN) 0.005 % ophthalmic solution SMARTSIG:In Eye(s)     levothyroxine (SYNTHROID) 75 MCG tablet TAKE 1 TABLET BY MOUTH  DAILY 100 tablet 2    Magnesium 500 MG TABS Take 2 tablets by mouth daily.     metFORMIN (GLUCOPHAGE-XR) 500 MG 24 hr tablet TAKE 3 TABLETS BY MOUTH DAILY 300 tablet 2   pantoprazole (PROTONIX) 40 MG tablet TAKE 1 TABLET BY MOUTH IN  THE MORNING 90 tablet 3   pyridOXINE (VITAMIN B-6) 100 MG tablet Take 100 mg by mouth daily.     TURMERIC CURCUMIN PO Take 1 tablet by mouth daily.     valsartan-hydrochlorothiazide (DIOVAN-HCT) 320-25 MG tablet TAKE 1 TABLET BY MOUTH DAILY .  DOSAGE CHANGE 100 tablet 1   Facility-Administered Medications Prior to Visit  Medication Dose Route Frequency Provider Last Rate Last Admin   betamethasone acetate-betamethasone sodium phosphate (CELESTONE) injection 3 mg  3 mg Intramuscular Once Gala Lewandowsky M, DPM       betamethasone acetate-betamethasone sodium phosphate (CELESTONE) injection 3 mg  3 mg Intramuscular Once Evans, Brent M, DPM         EXAM:  BP (!) 166/92 (BP Location: Right Arm, Patient Position: Sitting, Cuff Size: Normal)   Pulse 78   Temp 97.9 F (36.6 C) (Oral)   Ht 5' 1.5" (1.562 m)   Wt 148 lb (67.1 kg)   SpO2 95%   BMI 27.51 kg/m   Body mass index is 27.51 kg/m.  GENERAL: vitals reviewed and listed above, alert, oriented, appears well hydrated and in no acute distress HEENT: atraumatic, conjunctiva  clear, no obvious abnormalities on inspection of external nose and ears  NECK: no obvious masses on inspection palpation  LUNGS: clear to auscultation bilaterally, no wheezes, rales or rhonchi, good air movement CV: HRRR, no clubbing cyanosis or  peripheral edema nl cap refill  MS: moves all extremities without noticeable focal  abnormality PSYCH: pleasant and cooperative, no obvious depression or anxiety Lab Results  Component Value Date   WBC 10.9 (H) 07/29/2022   HGB 12.4 07/29/2022   HCT 37.7 07/29/2022   PLT 331 07/29/2022   GLUCOSE 153 (H) 06/14/2022   CHOL 134 03/14/2022   TRIG 115.0 03/14/2022   HDL 47.30 03/14/2022   LDLDIRECT 101.0 03/04/2019    LDLCALC 64 03/14/2022   ALT 25 03/14/2022   AST  28 03/14/2022   NA 141 06/14/2022   K 3.9 06/14/2022   CL 102 06/14/2022   CREATININE 1.03 06/14/2022   BUN 22 06/14/2022   CO2 32 06/14/2022   TSH 2.73 03/14/2022   INR 1.12 11/19/2009   HGBA1C 6.1 (A) 12/28/2022   MICROALBUR 28.2 (H) 03/14/2022   BP Readings from Last 3 Encounters:  12/28/22 (!) 166/92  09/26/22 (!) 178/80  08/08/22 (!) 146/80    ASSESSMENT AND PLAN:  Discussed the following assessment and plan:  Essential hypertension - not controlled  HYPERGLYCEMIA, FASTING - stable  control - Plan: POC HgB A1c  Acquired hypothyroidism  Medication management Essential hypertension Assessment & Plan: On diovan hctz add los dose amlodipine 2.5  may not be enough  fu in 1 month or as needed    HYPERGLYCEMIA, FASTING Assessment & Plan: A1c 6.1 controlled  Orders: -     POCT glycosylated hemoglobin (Hb A1C)  Acquired hypothyroidism  Medication management  Other orders -     amLODIPine Besylate; Take 1 tablet (2.5 mg total) by mouth daily. For high blood pressure  Dispense: 30 tablet; Refill: 1    34 minutes  review eval and counsel  -Patient advised to return or notify health care team  if  new concerns arise.  Patient Instructions  Blood sugar is good  today.   Adding BP med to your current meds to day and going slow.    Neta Mends.  M.D.

## 2022-12-28 NOTE — Assessment & Plan Note (Signed)
A1c 6.1 controlled

## 2022-12-28 NOTE — Patient Instructions (Signed)
Blood sugar is good  today.   Adding BP med to your current meds to day and going slow.

## 2022-12-28 NOTE — Assessment & Plan Note (Signed)
On diovan hctz add los dose amlodipine 2.5  may not be enough  fu in 1 month or as needed

## 2023-01-04 DIAGNOSIS — M9905 Segmental and somatic dysfunction of pelvic region: Secondary | ICD-10-CM | POA: Diagnosis not present

## 2023-01-04 DIAGNOSIS — M9903 Segmental and somatic dysfunction of lumbar region: Secondary | ICD-10-CM | POA: Diagnosis not present

## 2023-01-04 DIAGNOSIS — M5136 Other intervertebral disc degeneration, lumbar region: Secondary | ICD-10-CM | POA: Diagnosis not present

## 2023-01-04 DIAGNOSIS — M9904 Segmental and somatic dysfunction of sacral region: Secondary | ICD-10-CM | POA: Diagnosis not present

## 2023-01-05 ENCOUNTER — Encounter (HOSPITAL_COMMUNITY): Payer: Self-pay | Admitting: Gastroenterology

## 2023-01-12 ENCOUNTER — Ambulatory Visit (HOSPITAL_BASED_OUTPATIENT_CLINIC_OR_DEPARTMENT_OTHER): Payer: Medicare Other | Admitting: Certified Registered Nurse Anesthetist

## 2023-01-12 ENCOUNTER — Encounter (HOSPITAL_COMMUNITY): Payer: Self-pay | Admitting: Gastroenterology

## 2023-01-12 ENCOUNTER — Other Ambulatory Visit: Payer: Self-pay

## 2023-01-12 ENCOUNTER — Ambulatory Visit (HOSPITAL_COMMUNITY): Payer: Medicare Other | Admitting: Certified Registered Nurse Anesthetist

## 2023-01-12 ENCOUNTER — Ambulatory Visit (HOSPITAL_COMMUNITY)
Admission: RE | Admit: 2023-01-12 | Discharge: 2023-01-12 | Disposition: A | Discharge status: Home / Self Care | Payer: Medicare Other | Attending: Gastroenterology | Admitting: Gastroenterology

## 2023-01-12 ENCOUNTER — Encounter (HOSPITAL_COMMUNITY)
Admission: RE | Disposition: A | Discharge status: Home / Self Care | Payer: Self-pay | Source: Home / Self Care | Attending: Gastroenterology

## 2023-01-12 DIAGNOSIS — K644 Residual hemorrhoidal skin tags: Secondary | ICD-10-CM | POA: Diagnosis not present

## 2023-01-12 DIAGNOSIS — I8500 Esophageal varices without bleeding: Secondary | ICD-10-CM

## 2023-01-12 DIAGNOSIS — K2289 Other specified disease of esophagus: Secondary | ICD-10-CM | POA: Diagnosis not present

## 2023-01-12 DIAGNOSIS — K642 Third degree hemorrhoids: Secondary | ICD-10-CM | POA: Insufficient documentation

## 2023-01-12 DIAGNOSIS — K449 Diaphragmatic hernia without obstruction or gangrene: Secondary | ICD-10-CM

## 2023-01-12 DIAGNOSIS — I10 Essential (primary) hypertension: Secondary | ICD-10-CM

## 2023-01-12 DIAGNOSIS — D127 Benign neoplasm of rectosigmoid junction: Secondary | ICD-10-CM

## 2023-01-12 DIAGNOSIS — Z79899 Other long term (current) drug therapy: Secondary | ICD-10-CM | POA: Diagnosis not present

## 2023-01-12 DIAGNOSIS — Z1211 Encounter for screening for malignant neoplasm of colon: Secondary | ICD-10-CM

## 2023-01-12 DIAGNOSIS — D509 Iron deficiency anemia, unspecified: Secondary | ICD-10-CM

## 2023-01-12 DIAGNOSIS — K5730 Diverticulosis of large intestine without perforation or abscess without bleeding: Secondary | ICD-10-CM | POA: Insufficient documentation

## 2023-01-12 DIAGNOSIS — K219 Gastro-esophageal reflux disease without esophagitis: Secondary | ICD-10-CM | POA: Insufficient documentation

## 2023-01-12 DIAGNOSIS — E119 Type 2 diabetes mellitus without complications: Secondary | ICD-10-CM | POA: Insufficient documentation

## 2023-01-12 DIAGNOSIS — D123 Benign neoplasm of transverse colon: Secondary | ICD-10-CM | POA: Diagnosis not present

## 2023-01-12 DIAGNOSIS — D122 Benign neoplasm of ascending colon: Secondary | ICD-10-CM | POA: Insufficient documentation

## 2023-01-12 DIAGNOSIS — K317 Polyp of stomach and duodenum: Secondary | ICD-10-CM

## 2023-01-12 DIAGNOSIS — K635 Polyp of colon: Secondary | ICD-10-CM | POA: Diagnosis not present

## 2023-01-12 DIAGNOSIS — D120 Benign neoplasm of cecum: Secondary | ICD-10-CM | POA: Insufficient documentation

## 2023-01-12 DIAGNOSIS — K222 Esophageal obstruction: Secondary | ICD-10-CM | POA: Diagnosis not present

## 2023-01-12 DIAGNOSIS — Z7984 Long term (current) use of oral hypoglycemic drugs: Secondary | ICD-10-CM | POA: Insufficient documentation

## 2023-01-12 HISTORY — PX: COLONOSCOPY WITH PROPOFOL: SHX5780

## 2023-01-12 HISTORY — PX: SUBMUCOSAL LIFTING INJECTION: SHX6855

## 2023-01-12 HISTORY — PX: POLYPECTOMY: SHX5525

## 2023-01-12 HISTORY — PX: HEMOSTASIS CONTROL: SHX6838

## 2023-01-12 HISTORY — PX: ESOPHAGOGASTRODUODENOSCOPY (EGD) WITH PROPOFOL: SHX5813

## 2023-01-12 HISTORY — PX: ENDOSCOPIC MUCOSAL RESECTION: SHX6839

## 2023-01-12 HISTORY — PX: HEMOSTASIS CLIP PLACEMENT: SHX6857

## 2023-01-12 LAB — GLUCOSE, CAPILLARY: Glucose-Capillary: 102 mg/dL — ABNORMAL HIGH (ref 70–99)

## 2023-01-12 SURGERY — COLONOSCOPY WITH PROPOFOL
Anesthesia: Monitor Anesthesia Care

## 2023-01-12 MED ORDER — SODIUM CHLORIDE 0.9 % IV SOLN: INTRAVENOUS | Status: DC

## 2023-01-12 MED ORDER — PROPOFOL 10 MG/ML IV BOLUS
INTRAVENOUS | Status: DC | PRN
  Administered 2023-01-12: 20 mg via INTRAVENOUS
  Administered 2023-01-12: 10 mg via INTRAVENOUS

## 2023-01-12 MED ORDER — LACTATED RINGERS IV SOLN: INTRAVENOUS | Status: DC

## 2023-01-12 MED ORDER — ONDANSETRON HCL 4 MG/2ML IJ SOLN
INTRAMUSCULAR | Status: DC | PRN
  Administered 2023-01-12: 4 mg via INTRAVENOUS

## 2023-01-12 MED ORDER — PANTOPRAZOLE SODIUM 40 MG PO TBEC: 40.0000 mg | DELAYED_RELEASE_TABLET | Freq: Two times a day (BID) | ORAL | 12 refills | Status: DC

## 2023-01-12 MED ORDER — LIDOCAINE 2% (20 MG/ML) 5 ML SYRINGE
INTRAMUSCULAR | Status: DC | PRN
  Administered 2023-01-12: 60 mg via INTRAVENOUS

## 2023-01-12 MED ORDER — SUCRALFATE 1 G PO TABS: 1.0000 g | ORAL_TABLET | Freq: Two times a day (BID) | ORAL | 0 refills | Status: DC

## 2023-01-12 MED ORDER — PROPOFOL 500 MG/50ML IV EMUL
INTRAVENOUS | Status: DC | PRN
  Administered 2023-01-12: 150 ug/kg/min via INTRAVENOUS

## 2023-01-12 SURGICAL SUPPLY — 25 items

## 2023-01-12 NOTE — H&P (Signed)
GASTROENTEROLOGY PROCEDURE H&P NOTE   Primary Care Physician: Madelin Headings, MD  HPI: Kerry Walters is a 77 y.o. female who presents for EGD/Colonoscopy for evaluation of IDA and Screening with history of previous gastric polyps which could be source for some ID and for possible removal.  Past Medical History:  Diagnosis Date   Allergy    Asthma due to environmental allergies    no inhaler   Diabetes mellitus without complication (HCC)    Diverticulosis of colon    GERD (gastroesophageal reflux disease)    History of esophageal dilatation    FOR STRICTURE IN 2005   History of hiatal hernia    History of hypercalcemia    SECONDARY TO HYPERPARATHYROID   History of hyperparathyroidism    PRIMARY--  S/P RIGHT PARATHYROIDECTOMY   History of kidney stones    History of recurrent UTIs    Hyperlipidemia    Hypertension    Hypothyroidism    Incomplete right bundle branch block    Iron deficiency anemia due to chronic blood loss 12/01/2021   Nephrolithiasis    RIGHT    Nocturia    PONV (postoperative nausea and vomiting)    Pre-diabetes    Sciatica of right side    Past Surgical History:  Procedure Laterality Date   APPENDECTOMY  1974   CYSTOSCOPY WITH URETEROSCOPY AND STENT PLACEMENT Right 11/11/2014   Procedure: CYSTOSCOPY WITH URETEROSCOPY AND STENT PLACEMENT;  Surgeon: Crist Fat, MD;  Location: [facility];  Service: Urology;  Laterality: Right;   EXCISION URETHRAL CARUNCLE  1981   Open procedure   KNEE ARTHROSCOPY Left april 2010   REMOVAL RIP  1973   First Rib on Right side-- for impingement   RIGHT INFERIOR PARATHYROIDECTOMY, MINIMALLY INVASIVE  08-14-2008   TONSILLECTOMY  as child   TOTAL KNEE ARTHROPLASTY Left 11-17-2009   TUBAL LIGATION  1973   VAGINAL HYSTERECTOMY  1995   w/ Bilateral Salpingoophorectomy   Current Facility-Administered Medications  Medication Dose Route Frequency Provider Last Rate Last Admin   0.9 %  sodium  chloride infusion   Intravenous Continuous Mansouraty, Netty Starring., MD       lactated ringers infusion   Intravenous Continuous Mansouraty, Netty Starring., MD 10 mL/hr at 01/12/23 1233 Continued from Pre-op at 01/12/23 1233    Current Facility-Administered Medications:    0.9 %  sodium chloride infusion, , Intravenous, Continuous, Mansouraty, Netty Starring., MD   lactated ringers infusion, , Intravenous, Continuous, Mansouraty, Netty Starring., MD, Last Rate: 10 mL/hr at 01/12/23 1233, Continued from Pre-op at 01/12/23 1233 Allergies  Allergen Reactions   Contrast Media [Iodinated Contrast Media] Shortness Of Breath    Pt states in the 80's at Urologist visit had asthma attack reaction to IVP dye. Told not to have it again.    Codeine Nausea Only   Shrimp [Shellfish Allergy] Swelling    Only Shrimp   Topamax [Topiramate] Rash   Family History  Problem Relation Age of Onset   Cancer Father        brain   Cancer Son        liver   Diabetes Sister    Schizophrenia Son    Colon cancer Neg Hx    Colon polyps Neg Hx    Esophageal cancer Neg Hx    Stomach cancer Neg Hx    Pancreatic cancer Neg Hx    Rectal cancer Neg Hx    Social History   Socioeconomic History  Marital status: Married    Spouse name: Not on file   Number of children: Not on file   Years of education: Not on file   Highest education level: Not on file  Occupational History   Not on file  Tobacco Use   Smoking status: Never   Smokeless tobacco: Never  Vaping Use   Vaping Use: Never used  Substance and Sexual Activity   Alcohol use: No   Drug use: No   Sexual activity: Not on file  Other Topics Concern   Not on file  Social History Narrative   Married   hh of 2   4 dogs   G2 P2   Water exercise.   Retired    [employer]    great grandchild          Social Determinants of Corporate investment banker Strain: Low Risk  (03/24/2020)   Overall Financial Resource Strain (CARDIA)    Difficulty of Paying Living  Expenses: Not hard at all  Food Insecurity: Not on file  Transportation Needs: No Transportation Needs (03/24/2020)   PRAPARE - Administrator, Civil Service (Medical): No    Lack of Transportation (Non-Medical): No  Physical Activity: Not on file  Stress: Not on file  Social Connections: Not on file  Intimate Partner Violence: Not on file    Physical Exam: Today's Vitals   01/12/23 1055 01/12/23 1113 01/12/23 1119  BP: (!) 221/77 (!) 204/60 (!) 186/89  Pulse: 89 80 79  Resp: 20    Temp: 98 F (36.7 C)    TempSrc: Temporal    SpO2: 100%    Weight: 67.1 kg    Height: 5' 1.5" (1.562 m)    PainSc: 0-No pain     Body mass index is 27.51 kg/m. GEN: NAD EYE: Sclerae anicteric ENT: MMM CV: Non-tachycardic GI: Soft, NT/ND NEURO:  Alert & Oriented x 3  Lab Results: No results for input(s): "WBC", "HGB", "HCT", "PLT" in the last 72 hours. BMET No results for input(s): "NA", "K", "CL", "CO2", "GLUCOSE", "BUN", "CREATININE", "CALCIUM" in the last 72 hours. LFT No results for input(s): "PROT", "ALBUMIN", "AST", "ALT", "ALKPHOS", "BILITOT", "BILIDIR", "IBILI" in the last 72 hours. PT/INR No results for input(s): "LABPROT", "INR" in the last 72 hours.   Impression / Plan: This is a 77 y.o.female who presents for EGD/Colonoscopy for evaluation of IDA and Screening with history of previous gastric polyps which could be source for some ID and for possible removal.  The risks and benefits of endoscopic evaluation/treatment were discussed with the patient and/or family; these include but are not limited to the risk of perforation, infection, bleeding, missed lesions, lack of diagnosis, severe illness requiring hospitalization, as well as anesthesia and sedation related illnesses.  The patient's history has been reviewed, patient examined, no change in status, and deemed stable for procedure.  The patient and/or family is agreeable to proceed.    Corliss Parish,  MD [facility] Office # [phone]

## 2023-01-12 NOTE — Anesthesia Preprocedure Evaluation (Signed)
Anesthesia Evaluation  Patient identified by MRN, date of birth, ID band Patient awake    Reviewed: Allergy & Precautions, NPO status , Patient's Chart, lab work & pertinent test results  History of Anesthesia Complications (+) PONV and history of anesthetic complications  Airway Mallampati: II  TM Distance: >3 FB Neck ROM: Full    Dental no notable dental hx.    Pulmonary asthma    Pulmonary exam normal        Cardiovascular hypertension, Pt. on medications Normal cardiovascular exam     Neuro/Psych negative neurological ROS     GI/Hepatic Neg liver ROS, hiatal hernia,GERD  Medicated,,gastric polyp, IDA, screening colon   Endo/Other  diabetes, Type 2, Oral Hypoglycemic AgentsHypothyroidism    Renal/GU negative Renal ROS  negative genitourinary   Musculoskeletal   Abdominal   Peds  Hematology negative hematology ROS (+)   Anesthesia Other Findings Day of surgery medications reviewed with patient.  Reproductive/Obstetrics                             Anesthesia Physical Anesthesia Plan  ASA: 2  Anesthesia Plan: MAC   Post-op Pain Management: Minimal or no pain anticipated   Induction:   PONV Risk Score and Plan: 3 and Treatment may vary due to age or medical condition and Propofol infusion  Airway Management Planned: Natural Airway and Nasal Cannula  Additional Equipment: None  Intra-op Plan:   Post-operative Plan:   Informed Consent: I have reviewed the patients History and Physical, chart, labs and discussed the procedure including the risks, benefits and alternatives for the proposed anesthesia with the patient or authorized representative who has indicated his/her understanding and acceptance.       Plan Discussed with: CRNA  Anesthesia Plan Comments:        Anesthesia Quick Evaluation

## 2023-01-12 NOTE — Op Note (Signed)
[facility] Patient Name: Kerry Walters Procedure Date: 01/12/2023 MRN: [id number] Attending MD: Corliss Parish , MD, [id number] Date of Birth: April 11, 1946 CSN: [id number] Age: 77 Admit Type: Outpatient Procedure:                Upper GI endoscopy Indications:              Iron deficiency anemia, Follow-up of gastric                            polyps, For therapy of gastric polyps Providers:                Corliss Parish, MD, Norman Clay, RN, Harrington Challenger,                            Technician Referring MD:             Berniece Andreas, MD Medicines:                Monitored Anesthesia Care Complications:            No immediate complications. Estimated Blood Loss:     Estimated blood loss was minimal. Procedure:                Pre-Anesthesia Assessment:                           - Prior to the procedure, a History and Physical                            was performed, and patient medications and                            allergies were reviewed. The patient's tolerance of                            previous anesthesia was also reviewed. The risks                            and benefits of the procedure and the sedation                            options and risks were discussed with the patient.                            All questions were answered, and informed consent                            was obtained. Prior Anticoagulants: The patient has                            taken no anticoagulant or antiplatelet agents. ASA                            Grade Assessment: III - A patient with severe  systemic disease. After reviewing the risks and                            benefits, the patient was deemed in satisfactory                            condition to undergo the procedure.                           After obtaining informed consent, the endoscope was                            passed under direct vision. Throughout the                             procedure, the patient's blood pressure, pulse, and                            oxygen saturations were monitored continuously. The                            [id number]-[id number] ([id number]) Olympus therapeutic endoscope                            was introduced through the mouth, and advanced to                            the second part of duodenum. The upper GI endoscopy                            was technically difficult and complex. Successful                            completion of the procedure was aided by performing                            the maneuvers documented (below) in this report.                            The patient tolerated the procedure. Scope In: Scope Out: Findings:      A few medium-sized prominent venous blebs were found in the mid       esophagus and in the distal esophagus.      No other gross mucosal lesions were noted in the entire esophagus.      The Z-line was irregular and was found 31 cm from the incisors.      A 4 cm hiatal hernia was present.      Greater than 30, 2 to 25 mm sessile and semi-sessile polyps were found       in the examined stomach. 4 of these had stigmata of recent bleeding, the       largest being one of the 25 mm polyps. 2 these polyps were removed with       a cold snare. Resection and retrieval were complete. One of the polyps  was removed with a hot snare. Resection and retrieval were complete.       Preparations were made for attempt at mucosal resection of the largest       polyp. Demarcation of the lesion was performed with high-definition       white light and narrow band imaging to clearly identify the boundaries       of the lesion. EverLift was injected to raise the lesion successfully.       Snare mucosal resection was performed. Resection and retrieval were       complete. Resected tissue margins were examined and clear of polyp       tissue. There was some mild oozing that did occur after resection.        Fulguration to stop the bleeding that was caused by the procedure by       snare was partially successful. To further improve hemostasis, the area       was successfully injected with 3 mL PuraStat for hemostasis. To further       prevent bleeding after mucosal resection, four hemostatic clips were       successfully placed (MR conditional). Clip manufacturer: [employer]. There was no bleeding at the end of the procedure.      No gross lesions were noted in the duodenal bulb, in the first portion       of the duodenum and in the second portion of the duodenum. Impression:               - Venous blebs found in the middle and distal                            esophagus.                           - No other gross lesions in the entire esophagus.                           - Z-line irregular, 31 cm from the incisors.                           - 4 cm hiatal hernia.                           - Greater than 30 gastric polyps. 4 of these were                            removed/resected as noted above. They were                            retrieved. Largest resection required additional                            coagulation as well as PuraSTAT and clipping.                           - No gross lesions in the duodenal bulb, in the  first portion of the duodenum and in the second                            portion of the duodenum. Moderate Sedation:      Not Applicable - Patient had care per Anesthesia. Recommendation:           - Proceed to scheduled colonoscopy.                           - Recommend initiation of PPI twice daily for next                            2 months.                           - Carafate twice daily for 1 month.                           - Full liquid diet rest of today and if doing well                            then soft diet advancement starting tomorrow.                           - Minimize nonsteroidal medications for the next 2                             weeks.                           - Await pathology results.                           - Repeat upper endoscopy for surveillance will be                            recommended if evidence of adenomatous tissue was                            found, though I suspect this will not be the case.                           - The findings and recommendations were discussed                            with the patient.                           - The findings and recommendations were discussed                            with the patient's family. Procedure Code(s):        --- Professional ---  43254, Esophagogastroduodenoscopy, flexible,                            transoral; with endoscopic mucosal resection                           43255, 59, Esophagogastroduodenoscopy, flexible,                            transoral; with control of bleeding, any method                           43251, 59, Esophagogastroduodenoscopy, flexible,                            transoral; with removal of tumor(s), polyp(s), or                            other lesion(s) by snare technique Diagnosis Code(s):        --- Professional ---                           K22.89, Other specified disease of esophagus                           K44.9, Diaphragmatic hernia without obstruction or                            gangrene                           K31.7, Polyp of stomach and duodenum                           D50.9, Iron deficiency anemia, unspecified CPT copyright 2022 American Medical Association. All rights reserved. The codes documented in this report are preliminary and upon coder review may  be revised to meet current compliance requirements. Corliss Parish, MD 01/12/2023 2:23:05 PM Number of Addenda: 0

## 2023-01-12 NOTE — Op Note (Signed)
[facility] Patient Name: Kerry Walters Procedure Date: 01/12/2023 MRN: [id number] Attending MD: Corliss Parish , MD, [id number] Date of Birth: 10-07-1945 CSN: [id number] Age: 77 Admit Type: Outpatient Procedure:                Colonoscopy Indications:              Screening for colorectal malignant neoplasm,                            Incidental - Iron deficiency anemia Providers:                Corliss Parish, MD, Norman Clay, RN, Harrington Challenger,                            Technician Referring MD:             Berniece Andreas, MD Medicines:                Monitored Anesthesia Care Complications:            No immediate complications. Estimated Blood Loss:     Estimated blood loss was minimal. Procedure:                Pre-Anesthesia Assessment:                           - Prior to the procedure, a History and Physical                            was performed, and patient medications and                            allergies were reviewed. The patient's tolerance of                            previous anesthesia was also reviewed. The risks                            and benefits of the procedure and the sedation                            options and risks were discussed with the patient.                            All questions were answered, and informed consent                            was obtained. Prior Anticoagulants: The patient has                            taken no anticoagulant or antiplatelet agents. ASA                            Grade Assessment: III - A patient with severe  systemic disease. After reviewing the risks and                            benefits, the patient was deemed in satisfactory                            condition to undergo the procedure.                           After obtaining informed consent, the colonoscope                            was passed under direct vision. Throughout the                             procedure, the patient's blood pressure, pulse, and                            oxygen saturations were monitored continuously. The                            [id number] ([id number]) Olympus colonoscope was                            introduced through the anus and advanced to the the                            cecum, identified by appendiceal orifice and                            ileocecal valve. The colonoscopy was extremely                            difficult due to restricted mobility of the colon,                            significant looping and a tortuous colon.                            Successful completion of the procedure was aided by                            changing the patient's position, using manual                            pressure, straightening and shortening the scope to                            obtain bowel loop reduction and using scope                            torsion. The patient tolerated the procedure. The  quality of the bowel preparation was adequate. The                            ileocecal valve, appendiceal orifice, and rectum                            were photographed. Scope In: 1:30:23 PM Scope Out: 2:02:28 PM Scope Withdrawal Time: 0 hours 13 minutes 7 seconds  Total Procedure Duration: 0 hours 32 minutes 5 seconds  Findings:      Skin tags were found on perianal exam.      The digital rectal exam findings include hemorrhoids. Pertinent       negatives include no palpable rectal lesions.      A large amount of semi-liquid stool was found in the entire colon,       interfering with visualization. Lavage of the area was performed using       copious amounts, resulting in clearance with adequate visualization.      The colon (entire examined portion) revealed grossly excessive looping.      Multiple small-mouthed diverticula were found in the recto-sigmoid       colon, sigmoid colon and descending colon.      Nine  sessile polyps were found in the recto-sigmoid colon (2),       transverse colon (2), hepatic flexure (1), ascending colon (3) and cecum       (1). The polyps were 2 to 10 mm in size. These polyps were removed with       a cold snare. Resection and retrieval were complete.      Non-bleeding non-thrombosed external and internal hemorrhoids were found       during retroflexion, during perianal exam and during digital exam. The       hemorrhoids were Grade III (internal hemorrhoids that prolapse but       require manual reduction). Impression:               - Perianal skin tags found on perianal exam.                            Hemorrhoids found on digital rectal exam.                           - Stool in the entire examined colon. Lavaged with                            adequate visualization                           - There was significant looping of the colon.                           - Diverticulosis in the recto-sigmoid colon, in the                            sigmoid colon and in the descending colon.                           - Nine, 2 to 10 mm polyps at the  recto-sigmoid                            colon, in the transverse colon, at the hepatic                            flexure, in the ascending colon and in the cecum,                            removed with a cold snare. Resected and retrieved.                           - Non-bleeding non-thrombosed external and internal                            hemorrhoids. Moderate Sedation:      Not Applicable - Patient had care per Anesthesia. Recommendation:           - The patient will be observed post-procedure,                            until all discharge criteria are met.                           - Discharge patient to home.                           - Patient has a contact number available for                            emergencies. The signs and symptoms of potential                            delayed complications were discussed  with the                            patient. Return to normal activities tomorrow.                            Written discharge instructions were provided to the                            patient.                           - High fiber diet.                           - Use FiberCon 1-2 tablets PO daily.                           - Continue present medications.                           - Await pathology results.                           -  Repeat colonoscopy in 3 years for surveillance.                           - If patient continues to have evidence of iron                            deficiency anemia, recommend video capsule                            endoscopy be performed to rule out small bowel                            etiologies.                           - The findings and recommendations were discussed                            with the patient.                           - The findings and recommendations were discussed                            with the patient's family. Procedure Code(s):        --- Professional ---                           [phone], Colonoscopy, flexible; with removal of                            tumor(s), polyp(s), or other lesion(s) by snare                            technique Diagnosis Code(s):        --- Professional ---                           Z12.11, Encounter for screening for malignant                            neoplasm of colon                           K64.2, Third degree hemorrhoids                           D12.7, Benign neoplasm of rectosigmoid junction                           D12.3, Benign neoplasm of transverse colon (hepatic                            flexure or splenic flexure)                           D12.2, Benign neoplasm of  ascending colon                           D12.0, Benign neoplasm of cecum                           K64.4, Residual hemorrhoidal skin tags                           K57.30, Diverticulosis of large  intestine without                            perforation or abscess without bleeding CPT copyright 2022 American Medical Association. All rights reserved. The codes documented in this report are preliminary and upon coder review may  be revised to meet current compliance requirements. Corliss Parish, MD 01/12/2023 2:28:50 PM Number of Addenda: 0

## 2023-01-12 NOTE — Discharge Instructions (Signed)
YOU HAD AN ENDOSCOPIC PROCEDURE TODAY: Refer to the procedure report and other information in the discharge instructions given to you for any specific questions about what was found during the examination. If this information does not answer your questions, please call [place] office at [phone] to clarify.  ° °YOU SHOULD EXPECT: Some feelings of bloating in the abdomen. Passage of more gas than usual. Walking can help get rid of the air that was put into your GI tract during the procedure and reduce the bloating. If you had a lower endoscopy (such as a colonoscopy or flexible sigmoidoscopy) you may notice spotting of blood in your stool or on the toilet paper. Some abdominal soreness may be present for a day or two, also. ° °DIET: Your first meal following the procedure should be a light meal and then it is ok to progress to your normal diet. A half-sandwich or bowl of soup is an example of a good first meal. Heavy or fried foods are harder to digest and may make you feel nauseous or bloated. Drink plenty of fluids but you should avoid alcoholic beverages for 24 hours. If you had a esophageal dilation, please see attached instructions for diet.   ° °ACTIVITY: Your care partner should take you home directly after the procedure. You should plan to take it easy, moving slowly for the rest of the day. You can resume normal activity the day after the procedure however YOU SHOULD NOT DRIVE, use power tools, machinery or perform tasks that involve climbing or major physical exertion for 24 hours (because of the sedation medicines used during the test).  ° °SYMPTOMS TO REPORT IMMEDIATELY: °A gastroenterologist can be reached at any hour. Please call [phone]  for any of the following symptoms:  °Following lower endoscopy (colonoscopy, flexible sigmoidoscopy) °Excessive amounts of blood in the stool  °Significant tenderness, worsening of abdominal pains  °Swelling of the abdomen that is new, acute  °Fever of 100° or  higher  °Following upper endoscopy (EGD, EUS, ERCP, esophageal dilation) °Vomiting of blood or coffee ground material  °New, significant abdominal pain  °New, significant chest pain or pain under the shoulder blades  °Painful or persistently difficult swallowing  °New shortness of breath  °Black, tarry-looking or red, bloody stools ° °FOLLOW UP:  °If any biopsies were taken you will be contacted by phone or by letter within the next 1-3 weeks. Call [phone]  if you have not heard about the biopsies in 3 weeks.  °Please also call with any specific questions about appointments or follow up tests. ° °

## 2023-01-12 NOTE — Anesthesia Postprocedure Evaluation (Signed)
Anesthesia Post Note  Patient: Kerry Walters  Procedure(s) Performed: COLONOSCOPY WITH PROPOFOL ESOPHAGOGASTRODUODENOSCOPY (EGD) WITH PROPOFOL ENDOSCOPIC MUCOSAL RESECTION POLYPECTOMY SUBMUCOSAL LIFTING INJECTION HEMOSTASIS CLIP PLACEMENT HEMOSTASIS CONTROL     Patient location during evaluation: PACU Anesthesia Type: MAC Level of consciousness: awake and alert Pain management: pain level controlled Vital Signs Assessment: post-procedure vital signs reviewed and stable Respiratory status: spontaneous breathing, nonlabored ventilation and respiratory function stable Cardiovascular status: blood pressure returned to baseline Postop Assessment: no apparent nausea or vomiting Anesthetic complications: no   No notable events documented.  Last Vitals:  Vitals:   01/12/23 1435 01/12/23 1440  BP:  (!) 147/70  Pulse: (!) 57 60  Resp: 15 12  Temp:    SpO2: 100% 100%    Last Pain:  Vitals:   01/12/23 1430  TempSrc:   PainSc: 0-No pain                 Shanda Howells

## 2023-01-12 NOTE — Transfer of Care (Signed)
Immediate Anesthesia Transfer of Care Note  Patient: Kerry Walters  Procedure(s) Performed: COLONOSCOPY WITH PROPOFOL ESOPHAGOGASTRODUODENOSCOPY (EGD) WITH PROPOFOL ENDOSCOPIC MUCOSAL RESECTION POLYPECTOMY SUBMUCOSAL LIFTING INJECTION HEMOSTASIS CLIP PLACEMENT HEMOSTASIS CONTROL  Patient Location: Endoscopy Unit  Anesthesia Type:MAC  Level of Consciousness: awake, alert , and oriented  Airway & Oxygen Therapy: Patient Spontanous Breathing and Patient connected to face mask oxygen  Post-op Assessment: Report given to RN and Post -op Vital signs reviewed and stable  Post vital signs: Reviewed and stable  Last Vitals:  Vitals Value Taken Time  BP 154/66 01/12/23 1410  Temp    Pulse 66 01/12/23 1411  Resp 16 01/12/23 1411  SpO2 100 % 01/12/23 1411  Vitals shown include unvalidated device data.  Last Pain:  Vitals:   01/12/23 1055  TempSrc: Temporal  PainSc: 0-No pain         Complications: No notable events documented.

## 2023-01-13 LAB — SURGICAL PATHOLOGY

## 2023-01-15 ENCOUNTER — Encounter: Payer: Self-pay | Admitting: Gastroenterology

## 2023-01-15 NOTE — Progress Notes (Signed)
Noted  

## 2023-01-16 ENCOUNTER — Other Ambulatory Visit: Payer: Self-pay

## 2023-01-16 ENCOUNTER — Encounter (HOSPITAL_COMMUNITY): Payer: Self-pay | Admitting: Gastroenterology

## 2023-01-16 DIAGNOSIS — I8500 Esophageal varices without bleeding: Secondary | ICD-10-CM

## 2023-01-16 DIAGNOSIS — D509 Iron deficiency anemia, unspecified: Secondary | ICD-10-CM

## 2023-01-17 DIAGNOSIS — M9904 Segmental and somatic dysfunction of sacral region: Secondary | ICD-10-CM | POA: Diagnosis not present

## 2023-01-17 DIAGNOSIS — M5136 Other intervertebral disc degeneration, lumbar region: Secondary | ICD-10-CM | POA: Diagnosis not present

## 2023-01-17 DIAGNOSIS — M9903 Segmental and somatic dysfunction of lumbar region: Secondary | ICD-10-CM | POA: Diagnosis not present

## 2023-01-17 DIAGNOSIS — M9905 Segmental and somatic dysfunction of pelvic region: Secondary | ICD-10-CM | POA: Diagnosis not present

## 2023-01-24 ENCOUNTER — Inpatient Hospital Stay: Payer: Medicare Other

## 2023-01-24 DIAGNOSIS — M9905 Segmental and somatic dysfunction of pelvic region: Secondary | ICD-10-CM | POA: Diagnosis not present

## 2023-01-24 DIAGNOSIS — M9903 Segmental and somatic dysfunction of lumbar region: Secondary | ICD-10-CM | POA: Diagnosis not present

## 2023-01-24 DIAGNOSIS — M5136 Other intervertebral disc degeneration, lumbar region: Secondary | ICD-10-CM | POA: Diagnosis not present

## 2023-01-24 DIAGNOSIS — M9904 Segmental and somatic dysfunction of sacral region: Secondary | ICD-10-CM | POA: Diagnosis not present

## 2023-01-26 ENCOUNTER — Ambulatory Visit (INDEPENDENT_AMBULATORY_CARE_PROVIDER_SITE_OTHER): Payer: Medicare Other | Admitting: Internal Medicine

## 2023-01-26 ENCOUNTER — Other Ambulatory Visit: Payer: Self-pay | Admitting: Internal Medicine

## 2023-01-26 ENCOUNTER — Encounter: Payer: Self-pay | Admitting: Internal Medicine

## 2023-01-26 VITALS — BP 152/78 | HR 74 | Temp 97.5°F | Ht 61.5 in | Wt 147.0 lb

## 2023-01-26 DIAGNOSIS — K317 Polyp of stomach and duodenum: Secondary | ICD-10-CM

## 2023-01-26 DIAGNOSIS — D509 Iron deficiency anemia, unspecified: Secondary | ICD-10-CM

## 2023-01-26 DIAGNOSIS — E039 Hypothyroidism, unspecified: Secondary | ICD-10-CM

## 2023-01-26 DIAGNOSIS — E785 Hyperlipidemia, unspecified: Secondary | ICD-10-CM | POA: Diagnosis not present

## 2023-01-26 DIAGNOSIS — Z79899 Other long term (current) drug therapy: Secondary | ICD-10-CM | POA: Diagnosis not present

## 2023-01-26 DIAGNOSIS — I10 Essential (primary) hypertension: Secondary | ICD-10-CM

## 2023-01-26 DIAGNOSIS — E1165 Type 2 diabetes mellitus with hyperglycemia: Secondary | ICD-10-CM

## 2023-01-26 MED ORDER — AMLODIPINE BESYLATE 5 MG PO TABS
5.0000 mg | ORAL_TABLET | Freq: Every day | ORAL | 1 refills | Status: DC
Start: 2023-01-26 — End: 2023-02-23

## 2023-01-26 NOTE — Assessment & Plan Note (Signed)
Bp still high tolerates amlod 2.5 will inc to 5 mg  continuing valsartan hctz.  Fu about a month 4-6 weeks  .

## 2023-01-26 NOTE — Progress Notes (Signed)
Updated orders for June  and also for Dr Meridee Score

## 2023-01-26 NOTE — Progress Notes (Signed)
Chief Complaint  Patient presents with   Medical Management of Chronic Issues    Follow up on BP.     HPI: Kerry Walters 77 y.o. come in for Chronic disease management    Fu bp  We have been trying to get bp to goal  Since last visit  tolerating  2.5 amlodipine added but bp still high 150 range  feels fine   Had panendoscopy and dysplastic polyp  put ont Carafate for one month bid  has ? About when to take med  sometimes forgets  trying to take meds at most appropriate times  ROS: See pertinent positives and negatives per HPI.  Past Medical History:  Diagnosis Date   Allergy    Asthma due to environmental allergies    no inhaler   Diabetes mellitus without complication (HCC)    Diverticulosis of colon    GERD (gastroesophageal reflux disease)    History of esophageal dilatation    FOR STRICTURE IN 2005   History of hiatal hernia    History of hypercalcemia    SECONDARY TO HYPERPARATHYROID   History of hyperparathyroidism    PRIMARY--  S/P RIGHT PARATHYROIDECTOMY   History of kidney stones    History of recurrent UTIs    Hyperlipidemia    Hypertension    Hypothyroidism    Incomplete right bundle branch block    Iron deficiency anemia due to chronic blood loss 12/01/2021   Nephrolithiasis    RIGHT    Nocturia    PONV (postoperative nausea and vomiting)    Pre-diabetes    Sciatica of right side     Family History  Problem Relation Age of Onset   Cancer Father        brain   Cancer Son        liver   Diabetes Sister    Schizophrenia Son    Colon cancer Neg Hx    Colon polyps Neg Hx    Esophageal cancer Neg Hx    Stomach cancer Neg Hx    Pancreatic cancer Neg Hx    Rectal cancer Neg Hx     Social History   Socioeconomic History   Marital status: Married    Spouse name: Not on file   Number of children: Not on file   Years of education: Not on file   Highest education level: Not on file  Occupational History   Not on file  Tobacco Use   Smoking  status: Never   Smokeless tobacco: Never  Vaping Use   Vaping Use: Never used  Substance and Sexual Activity   Alcohol use: No   Drug use: No   Sexual activity: Not on file  Other Topics Concern   Not on file  Social History Narrative   Married   hh of 2   4 dogs   G2 P2   Water exercise.   Retired    [employer]    great grandchild          Social Determinants of Corporate investment banker Strain: Low Risk  (03/24/2020)   Overall Financial Resource Strain (CARDIA)    Difficulty of Paying Living Expenses: Not hard at all  Food Insecurity: Not on file  Transportation Needs: No Transportation Needs (03/24/2020)   PRAPARE - Administrator, Civil Service (Medical): No    Lack of Transportation (Non-Medical): No  Physical Activity: Not on file  Stress: Not on file  Social Connections: Not  on file    Outpatient Medications Prior to Visit  Medication Sig Dispense Refill   acetaminophen (TYLENOL) 500 MG tablet Take 500-1,000 mg by mouth every 6 (six) hours as needed for moderate pain.     atorvastatin (LIPITOR) 20 MG tablet TAKE 1 TABLET BY MOUTH IN THE  EVENING 100 tablet 2   cetirizine (ZYRTEC) 10 MG tablet Take 10 mg by mouth daily as needed for allergies.     Cholecalciferol (VITAMIN D) 50 MCG (2000 UT) tablet Take 6,000 Units by mouth daily.     conjugated estrogens (PREMARIN) vaginal cream Place vaginally as directed. Use 2-3 times weekly  prnPlace 1 Applicatorful vaginally as directed. Use 2-3 times weekly  prn (Patient taking differently: Place vaginally as directed. 1 Applicatorful vaginally as directed. Use 2-3 times weekly  prn) 42 g 4   Cyanocobalamin (B-12) 3000 MCG CAPS Take 3,000 mcg by mouth daily.     ferrous sulfate 325 (65 FE) MG tablet Take 1 tablet (325 mg total) by mouth every other day. (Patient taking differently: Take 325 mg by mouth daily.) 15 tablet 11   fluticasone (FLONASE) 50 MCG/ACT nasal spray Place 1 spray into both nostrils daily as needed  for allergies or rhinitis.     gabapentin (NEURONTIN) 100 MG capsule TAKE 1 TO 3 CAPSULES BY MOUTH AT BEDTIME AS DIRECTED (Patient taking differently: Take 100-200 mg by mouth daily as needed (pain).) 300 capsule 2   latanoprost (XALATAN) 0.005 % ophthalmic solution Place 1 drop into both eyes at bedtime.     levothyroxine (SYNTHROID) 75 MCG tablet TAKE 1 TABLET BY MOUTH  DAILY 100 tablet 2   magnesium oxide (MAG-OX) 400 (240 Mg) MG tablet Take 800 mg by mouth daily.     metFORMIN (GLUCOPHAGE-XR) 500 MG 24 hr tablet TAKE 3 TABLETS BY MOUTH DAILY 300 tablet 2   OVER THE COUNTER MEDICATION Take 1 tablet by mouth daily. Total Beets supplements     pantoprazole (PROTONIX) 40 MG tablet Take 1 tablet (40 mg total) by mouth 2 (two) times daily before a meal. 60 tablet 12   sucralfate (CARAFATE) 1 g tablet Take 1 tablet (1 g total) by mouth 2 (two) times daily. 60 tablet 0   Turmeric 500 MG CAPS Take 1,000 mg by mouth daily.     valsartan-hydrochlorothiazide (DIOVAN-HCT) 320-25 MG tablet TAKE 1 TABLET BY MOUTH DAILY .  DOSAGE CHANGE 100 tablet 1   amLODipine (NORVASC) 2.5 MG tablet Take 1 tablet (2.5 mg total) by mouth daily. For high blood pressure 30 tablet 1   Facility-Administered Medications Prior to Visit  Medication Dose Route Frequency Provider Last Rate Last Admin   betamethasone acetate-betamethasone sodium phosphate (CELESTONE) injection 3 mg  3 mg Intramuscular Once Gala Lewandowsky M, DPM       betamethasone acetate-betamethasone sodium phosphate (CELESTONE) injection 3 mg  3 mg Intramuscular Once Evans, Brent M, DPM         EXAM:  BP (!) 152/78 (BP Location: Right Arm, Patient Position: Sitting, Cuff Size: Large)   Pulse 74   Temp (!) 97.5 F (36.4 C) (Oral)   Ht 5' 1.5" (1.562 m)   Wt 147 lb (66.7 kg)   SpO2 99%   BMI 27.33 kg/m   Body mass index is 27.33 kg/m.  GENERAL: vitals reviewed and listed above, alert, oriented, appears well hydrated and in no acute distress HEENT:  atraumatic, conjunctiva  clear, no obvious abnormalities on inspection of external nose and ears  NECK:  no obvious masses on inspection palpation  LUNGS: clear to auscultation bilaterally, no wheezes, rales or rhonchi, good air movement CV: HRRR, no clubbing cyanosis or  peripheral edema nl cap refill  MS: moves all extremities without noticeable focal  abnormality PSYCH: pleasant and cooperative, no obvious depression or anxiety Lab Results  Component Value Date   WBC 10.9 (H) 07/29/2022   HGB 12.4 07/29/2022   HCT 37.7 07/29/2022   PLT 331 07/29/2022   GLUCOSE 153 (H) 06/14/2022   CHOL 134 03/14/2022   TRIG 115.0 03/14/2022   HDL 47.30 03/14/2022   LDLDIRECT 101.0 03/04/2019   LDLCALC 64 03/14/2022   ALT 25 03/14/2022   AST 28 03/14/2022   NA 141 06/14/2022   K 3.9 06/14/2022   CL 102 06/14/2022   CREATININE 1.03 06/14/2022   BUN 22 06/14/2022   CO2 32 06/14/2022   TSH 2.73 03/14/2022   INR 1.12 11/19/2009   HGBA1C 6.1 (A) 12/28/2022   MICROALBUR 28.2 (H) 03/14/2022   BP Readings from Last 3 Encounters:  01/26/23 (!) 152/78  01/12/23 (!) 147/70  12/28/22 (!) 166/92    ASSESSMENT AND PLAN:  Discussed the following assessment and plan:  Essential hypertension - Plan: AMB Referral to Community Care Coordinaton ([facility] Patients)  Medication management  Acquired hypothyroidism - Plan: AMB Referral to Community Care Coordinaton ([facility] Patients)  Type 2 diabetes mellitus with hyperglycemia, without long-term current use of insulin (HCC) - Plan: AMB Referral to Community Care Coordinaton ([facility] Patients)  Hyperlipidemia, unspecified hyperlipidemia type - Plan: AMB Referral to Community Care Coordinaton ([facility] Patients) To have fu cbc iron studies per gi  can do here and add  updated lipids chem tsh  etc  at next visit  Inc amlodipine to 5 mg and do ccm referral for pharmacy help. -Patient advised to return or notify health care team  if  new concerns arise.  Patient  Instructions  Ok to get fu lab here make appt at end of June and we can send results to Dr Judie Petit.  Increase  amlodipine to  5 mg amlodipine   To see what bp is doing after a month or se.   Will refer to chronic  management team . Clinical pharmacist may be helpful in deciding best way to take your medications.   Neta Mends.  M.D.

## 2023-01-26 NOTE — Patient Instructions (Addendum)
Ok to get fu lab here make appt at end of June and we can send results to Dr Judie Petit.  Increase  amlodipine to  5 mg amlodipine   To see what bp is doing after a month or se.   Will refer to chronic  management team . Clinical pharmacist may be helpful in deciding best way to take your medications.

## 2023-01-31 ENCOUNTER — Inpatient Hospital Stay: Payer: Medicare Other | Admitting: Physician Assistant

## 2023-02-02 ENCOUNTER — Telehealth: Payer: Self-pay

## 2023-02-02 NOTE — Progress Notes (Signed)
Care Management & Coordination Services Pharmacy Team  Reason for Encounter: Appointment Reminder  Contacted patient to confirm in office appointment with Delano Metz, PharmD on 02/08/2023 at 10:00. Spoke with patient on 02/02/2023 Patient notified the visit is at [facility] (she does not have MyChart.)  Have you seen any other providers since your last visit? ** Patient denies  Any changes in your medications or health? Patient denies  Any side effects from any medications? Patient denies  Do you have an symptoms or problems not managed by your medications? Patient denies  Any concerns about your health right now? Patient denies  Has your provider asked that you check blood pressure, blood sugar, or follow special diet at home? Patient is checking blood pressures  Do you get any type of exercise on a regular basis? Patient stays busy with house work, gardening and church activities.   Can you think of a goal you would like to reach for your health? Patient denies  Do you have any problems getting your medications? Patient denies  Patient notified to bring medications and supplements to appointment, she states she has a list of all her medications and supplements, she will bring this to the office for this appointment.    Chart review:  Recent office visits:  01/26/2023 Berniece Andreas MD - Patient was seen for essential hypertension and additional concerns. Increased Amlodipine to 5 mg daily.   12/28/2022 Berniece Andreas MD - Patient was seen for essential hypertension and additional concerns. Started Amlodipine 2.5 mg daily.  09/26/2022 Worthy Rancher FNP - Patient was seen for hyperglycemia and additional concerns. No medication changes.   Recent consult visits:  10/13/2022 Tressia Danas MD ([facility] [place] [facility]) - Patient was seen for consult for repair of hiatal hernia as possible treatment for her hiccups. No medication changes.   08/08/2022 Tressia Danas MD (GI)  - Patient was seen for chronic hiccups and an additional concern. No medication changes.   08/04/2022 Alvan Dame (chiro) - Patient was seen for Segmental and somatic dysfunction of lumbar region and additional concerns. No additional chart notes.   Hospital visits:  Admitted to [facility] on 01/12/2023 (4 hours) due to colonoscopy.   New?Medications Started at [facility] Discharge:?? sucralfate (Carafate) Medication Changes at Hospital Discharge: pantoprazole (PROTONIX) Medications Discontinued at Hospital Discharge: None Medications that remain the same after Hospital Discharge:??  -All other medications will remain the same.     Care Gaps: AWV -  Last BP - 152/78 on 01/26/2023 Last A1C - 6.1 on 12/28/2022 Last eye exam - 07/15/2021 Last foot exam - 03/03/2021 Hep C Screen - never done Dexa - never done Covid - overdue  Star Rating Drugs:  Atorvastatin 20 mg - last filled Metformin 500 mg - last filled  Valsartan HCTZ 320/25 mg - last filled   Inetta Fermo [facility]  Clinical Pharmacist Assistant [phone]

## 2023-02-05 NOTE — Progress Notes (Signed)
Care Management & Coordination Services Pharmacy Note  02/05/2023 Name:  Kerry Walters MRN:  [id number] DOB:  07-10-1946  Summary: ***  Recommendations/Changes made from today's visit: ***  Follow up plan: ***   Subjective: Kerry Walters is an 77 y.o. year old female who is a primary patient of Panosh, Neta Mends, MD.  The care coordination team was consulted for assistance with disease management and care coordination needs.    {CCMTELEPHONEFACETOFACE:21091510} for initial visit.  Recent office visits: 01/26/2023 Berniece Andreas MD - Patient was seen for essential hypertension and additional concerns. Increased Amlodipine to 5 mg daily.    12/28/2022 Berniece Andreas MD - Patient was seen for essential hypertension and additional concerns. Started Amlodipine 2.5 mg daily.   09/26/2022 Worthy Rancher FNP - Patient was seen for hyperglycemia and additional concerns. No medication changes.   Recent consult visits: 10/13/2022 Tressia Danas MD ([place] [facility]) - Patient was seen for consult for repair of hiatal hernia as possible treatment for her hiccups. No medication changes.    08/08/2022 Tressia Danas MD (GI) - Patient was seen for chronic hiccups and an additional concern. No medication changes.    08/04/2022 Alvan Dame (chiro) - Patient was seen for Segmental and somatic dysfunction of lumbar region and additional concerns. No additional chart notes.  Hospital visits: 01/12/23 [facility] - For colonoscopy, LOS 4 hours, START Carafate, Change Protonix   Objective:  Lab Results  Component Value Date   CREATININE 1.03 06/14/2022   BUN 22 06/14/2022   GFR 52.15 (L) 06/14/2022   GFRNONAA 71.02 08/03/2010   GFRAA  11/19/2009    >60        The eGFR has been calculated using the MDRD equation. This calculation has not been validated in all clinical situations. eGFR's persistently <60 mL/min signify possible Chronic Kidney Disease.   NA 141 06/14/2022   K  3.9 06/14/2022   CALCIUM 10.6 (H) 06/14/2022   CALCIUM 10.8 (H) 06/14/2022   CO2 32 06/14/2022   GLUCOSE 153 (H) 06/14/2022    Lab Results  Component Value Date/Time   HGBA1C 6.1 (A) 12/28/2022 10:51 AM   HGBA1C 6.0 (A) 09/26/2022 11:14 AM   HGBA1C 6.4 03/14/2022 09:42 AM   HGBA1C 6.7 (H) 03/09/2021 10:13 AM   GFR 52.15 (L) 06/14/2022 10:13 AM   GFR 47.75 (L) 03/14/2022 09:42 AM   MICROALBUR 28.2 (H) 03/14/2022 09:42 AM   MICROALBUR 34.8 (H) 03/09/2021 10:13 AM    Last diabetic Eye exam:  Lab Results  Component Value Date/Time   HMDIABEYEEXA No Retinopathy 07/15/2021 12:00 AM    Last diabetic Foot exam: No results found for: "HMDIABFOOTEX"   Lab Results  Component Value Date   CHOL 134 03/14/2022   HDL 47.30 03/14/2022   LDLCALC 64 03/14/2022   LDLDIRECT 101.0 03/04/2019   TRIG 115.0 03/14/2022   CHOLHDL 3 03/14/2022       Latest Ref Rng & Units 03/14/2022    9:42 AM 03/09/2021   10:13 AM 09/07/2020   11:55 AM  Hepatic Function  Total Protein 6.0 - 8.3 g/dL 7.2  6.8  6.6   Albumin 3.5 - 5.2 g/dL 4.5  4.3  4.3   AST 0 - 37 U/L 28  23  23    ALT 0 - 35 U/L 25  21  24    Alk Phosphatase 39 - 117 U/L 62  67  73   Total Bilirubin 0.2 - 1.2 mg/dL 0.4  0.4  0.4  Bilirubin, Direct 0.0 - 0.3 mg/dL 0.1  0.1      Lab Results  Component Value Date/Time   TSH 2.73 03/14/2022 09:42 AM   TSH 3.19 03/09/2021 10:13 AM   FREET4 1.05 03/09/2021 10:13 AM   FREET4 1.42 09/07/2020 11:55 AM       Latest Ref Rng & Units 07/29/2022   11:54 AM 03/14/2022    9:42 AM 01/25/2022   10:45 AM  CBC  WBC 4.0 - 10.5 K/uL 10.9  9.3  9.3   Hemoglobin 12.0 - 15.0 g/dL 16.1  09.6  04.5   Hematocrit 36.0 - 46.0 % 37.7  40.1  38.9   Platelets 150 - 400 K/uL 331  283.0  261     Lab Results  Component Value Date/Time   VD25OH 89.59 09/07/2020 11:55 AM   VITAMINB12 1,879 (H) 07/29/2022 11:54 AM   VITAMINB12 4,469 (H) 01/25/2022 10:44 AM    Clinical ASCVD: Yes  The 10-year ASCVD risk score  (Arnett DK, et al., 2019) is: 59.4%   Values used to calculate the score:     Age: 45 years     Sex: Female     Is Non-Hispanic African American: No     Diabetic: Yes     Tobacco smoker: No     Systolic Blood Pressure: 152 mmHg     Is BP treated: Yes     HDL Cholesterol: 47.3 mg/dL     Total Cholesterol: 134 mg/dL       12/05/8117   14:78 AM 06/14/2022    9:49 AM 03/14/2022    8:53 AM  Depression screen PHQ 2/9  Decreased Interest 0 0 0  Down, Depressed, Hopeless 0 0 0  PHQ - 2 Score 0 0 0  Altered sleeping 2 1 2   Tired, decreased energy 2 0 0  Change in appetite 0 0 0  Feeling bad or failure about yourself  0 0 0  Trouble concentrating 0 0 0  Moving slowly or fidgety/restless 0 0 0  Suicidal thoughts 0 0 0  PHQ-9 Score 4 1 2   Difficult doing work/chores Not difficult at all Somewhat difficult Not difficult at all     Social History   Tobacco Use  Smoking Status Never  Smokeless Tobacco Never   BP Readings from Last 3 Encounters:  01/26/23 (!) 152/78  01/12/23 (!) 147/70  12/28/22 (!) 166/92   Pulse Readings from Last 3 Encounters:  01/26/23 74  01/12/23 60  12/28/22 78   Wt Readings from Last 3 Encounters:  01/26/23 147 lb (66.7 kg)  01/12/23 148 lb (67.1 kg)  12/28/22 148 lb (67.1 kg)   BMI Readings from Last 3 Encounters:  01/26/23 27.33 kg/m  01/12/23 27.51 kg/m  12/28/22 27.51 kg/m    Allergies  Allergen Reactions   Contrast Media [Iodinated Contrast Media] Shortness Of Breath    Pt states in the 80's at Urologist visit had asthma attack reaction to IVP dye. Told not to have it again.    Codeine Nausea Only   Shrimp [Shellfish Allergy] Swelling    Only Shrimp   Topamax [Topiramate] Rash    Medications Reviewed Today     Reviewed by Madelin Headings, MD (Physician) on 01/26/23 at 1031  Med List Status: <None>   Medication Order Taking? Sig Documenting Provider Last Dose Status Informant  acetaminophen (TYLENOL) 500 MG tablet [id number] Yes  Take 500-1,000 mg by mouth every 6 (six) hours as needed for moderate pain. [provider] Taking Active Self  amLODipine (NORVASC) 5 MG tablet [id number] Yes Take 1 tablet (5 mg total) by mouth daily. Panosh, Neta Mends, MD  Active   atorvastatin (LIPITOR) 20 MG tablet [id number] Yes TAKE 1 TABLET BY MOUTH IN THE  EVENING Panosh, Neta Mends, MD Taking Active Self  betamethasone acetate-betamethasone sodium phosphate (CELESTONE) injection 3 mg [id number]   Felecia Shelling, DPM  Active   betamethasone acetate-betamethasone sodium phosphate (CELESTONE) injection 3 mg [id number]   Felecia Shelling, DPM  Active   cetirizine (ZYRTEC) 10 MG tablet [id number] Yes Take 10 mg by mouth daily as needed for allergies. [provider] Taking Active Self  Cholecalciferol (VITAMIN D) 50 MCG (2000 UT) tablet [id number] Yes Take 6,000 Units by mouth daily. [provider] Taking Active Self  conjugated estrogens (PREMARIN) vaginal cream [id number] Yes Place vaginally as directed. Use 2-3 times weekly  prnPlace 1 Applicatorful vaginally as directed. Use 2-3 times weekly  prn  Patient taking differently: Place vaginally as directed. 1 Applicatorful vaginally as directed. Use 2-3 times weekly  prn   Panosh, Neta Mends, MD Taking Active Self  Cyanocobalamin (B-12) 3000 MCG CAPS [id number] Yes Take 3,000 mcg by mouth daily. [provider] Taking Active Self  ferrous sulfate 325 (65 FE) MG tablet [id number] Yes Take 1 tablet (325 mg total) by mouth every other day.  Patient taking differently: Take 325 mg by mouth daily.   Tressia Danas, MD Taking Active Self  fluticasone [facility]) 50 MCG/ACT nasal spray [id number] Yes Place 1 spray into both nostrils daily as needed for allergies or rhinitis. [provider] Taking Active Self  gabapentin (NEURONTIN) 100 MG capsule [id number] Yes TAKE 1 TO 3 CAPSULES BY MOUTH AT BEDTIME AS DIRECTED  Patient taking differently: Take 100-200 mg by mouth daily as  needed (pain).   Panosh, Neta Mends, MD Taking Active Self  latanoprost (XALATAN) 0.005 % ophthalmic solution [id number] Yes Place 1 drop into both eyes at bedtime. [provider] Taking Active Self  levothyroxine (SYNTHROID) 75 MCG tablet [id number] Yes TAKE 1 TABLET BY MOUTH  DAILY Panosh, Neta Mends, MD Taking Active Self  magnesium oxide (MAG-OX) 400 (240 Mg) MG tablet [id number] Yes Take 800 mg by mouth daily. [provider] Taking Active Self  metFORMIN (GLUCOPHAGE-XR) 500 MG 24 hr tablet [id number] Yes TAKE 3 TABLETS BY MOUTH DAILY Panosh, Neta Mends, MD Taking Active Self  OVER THE COUNTER MEDICATION [id number] Yes Take 1 tablet by mouth daily. Total Beets supplements [provider] Taking Active Self  pantoprazole (PROTONIX) 40 MG tablet [id number] Yes Take 1 tablet (40 mg total) by mouth 2 (two) times daily before a meal. Mansouraty, Netty Starring., MD Taking Active   sucralfate (CARAFATE) 1 g tablet [id number] Yes Take 1 tablet (1 g total) by mouth 2 (two) times daily. Mansouraty, Netty Starring., MD Taking Active   Turmeric 500 MG CAPS [id number] Yes Take 1,000 mg by mouth daily. [provider] Taking Active Self  valsartan-hydrochlorothiazide (DIOVAN-HCT) 320-25 MG tablet [id number] Yes TAKE 1 TABLET BY MOUTH DAILY .  DOSAGE CHANGE Panosh, Neta Mends, MD Taking Active Self            SDOH:  (Social Determinants of Health) assessments and interventions performed: Yes SDOH Interventions    Flowsheet Row Chronic Care Management from 03/18/2020 in [facility] [place]  SDOH Interventions   Transportation Interventions Intervention Not Indicated  [Reports still driving.]  Financial Strain Interventions Intervention Not Indicated  Medication Assistance: {MEDASSISTANCEINFO:25044}  Medication Access: Name and location of current pharmacy:  [facility]) - [place], [place] - [address]  [place] [address] [place] [address] Phone: [phone] Fax: [phone]  [facility] - [place], [place] - [address] [place] [address] Phone: [phone] Fax: [phone]  [id number] [place], [place] - [address] [place] [address] Phone: [phone] Fax: [phone]  Within the past 30 days, how often has patient missed a dose of medication? *** Is a pillbox or other method used to improve adherence? {YES/NO:21197} Factors that may affect medication adherence? {CHL DESC; BARRIERS:21522} Are meds synced by current pharmacy? {YES/NO:21197} Are meds delivered by current pharmacy? {YES/NO:21197} Does patient experience delays in picking up medications due to transportation concerns? {YES/NO:21197}  Compliance/Adherence/Medication fill history: Care Gaps: AWV - last 07/2016 Last BP - 152/78 on 01/26/2023 Last A1C - 6.1 on 12/28/2022 Last eye exam - 07/15/2021 Last foot exam - 03/03/2021 Hep C Screen - never done Dexa - never done Covid - overdue  Star-Rating Drugs: Atorvastatin 20 mg - PDC 100% Metformin XR 500 mg - PDC 100% Valsartan HCTZ 320/25 mg - PDC 100%   Assessment/Plan Hypertension (BP goal <140/90) -Uncontrolled -Current treatment: Amlodipine 5mg  1 qd Valsartan-HCTZ 320-25mg  1 qd -Medications previously tried: Losartan, Triamterene -Current home readings: *** -Current dietary habits: *** -Current exercise habits: *** -{ACTIONS;DENIES/REPORTS:21021675::"Denies"} hypotensive/hypertensive symptoms -Educated on {CCM BP Counseling:25124} -Counseled to monitor BP at home ***, document, and provide log at future appointments -{CCMPHARMDINTERVENTION:25122}  Hyperlipidemia: (LDL goal < 70) -Controlled -Current treatment: Atorvastatin 20mg  1 qd -Medications previously tried: None  -Current dietary patterns: *** -Current exercise  habits: *** -Educated on {CCM HLD Counseling:25126} -{CCMPHARMDINTERVENTION:25122}  Pre-Diabetes (A1c goal <6.5%) -Not assessed today -Current medications: Metformin XR 500mg  3 tabs daily Appropriate, Effective, Safe, Accessible  Hypothyroidism (Goal: TSH WNL) -Not assessed today -Current treatment  Levothyroxine 1 qd  Allergic Rhinitis -Not assessed today -Current treatment  Zyrtec 10mg  1 qd Flonase 1 spray into both nostrils daily prn allergies  Iron Deficiency Anemia  -Current treatment  Ferrous Sulfate 325mg  1 tab qod  GERD (Goal: minimize symptoms of reflux ) -Not assessed today -Current treatment  Pantoprazole 40mg  1 BID before a meal Sucralfate 1g 1 BID  Hot Flashes/Menopause  -Not assessed today -Current treatment  Premarin vaginal cream 2-3x/week prn  Chronic Pain  -Not assessed today -Current treatment  Gabapentin 100mg  1 to 3 caps at bedtime prn   OTC  -Current treatment  Acetaminophen 500mg  1-2 tabs q 6 h prn Vit D 2000 units 1 tab qd Magnesium Oxide 400mg  2 tabs qd Turmeric 500mg  2 caps qd Total Beets supplement Vit B12 1 qd    Sherrill Raring Clinical Pharmacist [phone]

## 2023-02-07 DIAGNOSIS — M9903 Segmental and somatic dysfunction of lumbar region: Secondary | ICD-10-CM | POA: Diagnosis not present

## 2023-02-07 DIAGNOSIS — M9904 Segmental and somatic dysfunction of sacral region: Secondary | ICD-10-CM | POA: Diagnosis not present

## 2023-02-07 DIAGNOSIS — M9905 Segmental and somatic dysfunction of pelvic region: Secondary | ICD-10-CM | POA: Diagnosis not present

## 2023-02-07 DIAGNOSIS — M5136 Other intervertebral disc degeneration, lumbar region: Secondary | ICD-10-CM | POA: Diagnosis not present

## 2023-02-08 ENCOUNTER — Ambulatory Visit: Payer: Medicare Other

## 2023-02-08 ENCOUNTER — Other Ambulatory Visit: Payer: Self-pay | Admitting: Gastroenterology

## 2023-02-14 DIAGNOSIS — M5136 Other intervertebral disc degeneration, lumbar region: Secondary | ICD-10-CM | POA: Diagnosis not present

## 2023-02-14 DIAGNOSIS — M9903 Segmental and somatic dysfunction of lumbar region: Secondary | ICD-10-CM | POA: Diagnosis not present

## 2023-02-14 DIAGNOSIS — M9904 Segmental and somatic dysfunction of sacral region: Secondary | ICD-10-CM | POA: Diagnosis not present

## 2023-02-14 DIAGNOSIS — M9905 Segmental and somatic dysfunction of pelvic region: Secondary | ICD-10-CM | POA: Diagnosis not present

## 2023-02-15 ENCOUNTER — Other Ambulatory Visit: Payer: Self-pay | Admitting: Internal Medicine

## 2023-02-22 NOTE — Progress Notes (Unsigned)
No chief complaint on file.   HPI: Kerry Walters 77 y.o. come in for Chronic disease management   BP:  valsartanhctz adding inc dose amlodipine 5 mg  ROS: See pertinent positives and negatives per HPI.  Past Medical History:  Diagnosis Date   Allergy    Asthma due to environmental allergies    no inhaler   Diabetes mellitus without complication (HCC)    Diverticulosis of colon    GERD (gastroesophageal reflux disease)    History of esophageal dilatation    FOR STRICTURE IN 2005   History of hiatal hernia    History of hypercalcemia    SECONDARY TO HYPERPARATHYROID   History of hyperparathyroidism    PRIMARY--  S/P RIGHT PARATHYROIDECTOMY   History of kidney stones    History of recurrent UTIs    Hyperlipidemia    Hypertension    Hypothyroidism    Incomplete right bundle branch block    Iron deficiency anemia due to chronic blood loss 12/01/2021   Nephrolithiasis    RIGHT    Nocturia    PONV (postoperative nausea and vomiting)    Pre-diabetes    Sciatica of right side     Family History  Problem Relation Age of Onset   Cancer Father        brain   Cancer Son        liver   Diabetes Sister    Schizophrenia Son    Colon cancer Neg Hx    Colon polyps Neg Hx    Esophageal cancer Neg Hx    Stomach cancer Neg Hx    Pancreatic cancer Neg Hx    Rectal cancer Neg Hx     Social History   Socioeconomic History   Marital status: Married    Spouse name: Not on file   Number of children: Not on file   Years of education: Not on file   Highest education level: Not on file  Occupational History   Not on file  Tobacco Use   Smoking status: Never   Smokeless tobacco: Never  Vaping Use   Vaping Use: Never used  Substance and Sexual Activity   Alcohol use: No   Drug use: No   Sexual activity: Not on file  Other Topics Concern   Not on file  Social History Narrative   Married   hh of 2   4 dogs   G2 P2   Water exercise.   Retired    [employer]    great  grandchild          Social Determinants of Corporate investment banker Strain: Low Risk  (03/24/2020)   Overall Financial Resource Strain (CARDIA)    Difficulty of Paying Living Expenses: Not hard at all  Food Insecurity: No Food Insecurity (02/08/2023)   Hunger Vital Sign    Worried About Running Out of Food in the Last Year: Never true    Ran Out of Food in the Last Year: Never true  Transportation Needs: No Transportation Needs (03/24/2020)   PRAPARE - Administrator, Civil Service (Medical): No    Lack of Transportation (Non-Medical): No  Physical Activity: Not on file  Stress: Not on file  Social Connections: Not on file    Outpatient Medications Prior to Visit  Medication Sig Dispense Refill   acetaminophen (TYLENOL) 500 MG tablet Take 500-1,000 mg by mouth every 6 (six) hours as needed for moderate pain.     amLODipine (  NORVASC) 5 MG tablet Take 1 tablet (5 mg total) by mouth daily. 30 tablet 1   atorvastatin (LIPITOR) 20 MG tablet TAKE 1 TABLET BY MOUTH IN THE  EVENING 100 tablet 2   cetirizine (ZYRTEC) 10 MG tablet Take 10 mg by mouth daily as needed for allergies.     Cholecalciferol (VITAMIN D) 50 MCG (2000 UT) tablet Take 6,000 Units by mouth daily.     conjugated estrogens (PREMARIN) vaginal cream Place vaginally as directed. Use 2-3 times weekly  prnPlace 1 Applicatorful vaginally as directed. Use 2-3 times weekly  prn (Patient taking differently: Place vaginally as directed. 1 Applicatorful vaginally as directed. Use 2-3 times weekly  prn) 42 g 4   Cyanocobalamin (B-12) 3000 MCG CAPS Take 3,000 mcg by mouth daily.     ferrous sulfate 325 (65 FE) MG tablet Take 1 tablet (325 mg total) by mouth every other day. (Patient taking differently: Take 325 mg by mouth daily.) 15 tablet 11   fluticasone (FLONASE) 50 MCG/ACT nasal spray Place 1 spray into both nostrils daily as needed for allergies or rhinitis.     gabapentin (NEURONTIN) 100 MG capsule TAKE 1 TO 3 CAPSULES  BY MOUTH AT BEDTIME AS DIRECTED (Patient taking differently: Take 100-200 mg by mouth daily as needed (pain).) 300 capsule 2   latanoprost (XALATAN) 0.005 % ophthalmic solution Place 1 drop into both eyes at bedtime.     levothyroxine (SYNTHROID) 75 MCG tablet TAKE 1 TABLET BY MOUTH DAILY 100 tablet 2   magnesium oxide (MAG-OX) 400 (240 Mg) MG tablet Take 800 mg by mouth daily.     metFORMIN (GLUCOPHAGE-XR) 500 MG 24 hr tablet TAKE 3 TABLETS BY MOUTH DAILY 300 tablet 2   OVER THE COUNTER MEDICATION Take 1 tablet by mouth daily. Total Beets supplements     pantoprazole (PROTONIX) 40 MG tablet Take 1 tablet (40 mg total) by mouth 2 (two) times daily before a meal. 60 tablet 12   sucralfate (CARAFATE) 1 g tablet TAKE 1 TABLET BY MOUTH 2 TIMES DAILY. 60 tablet 0   Turmeric 500 MG CAPS Take 1,000 mg by mouth daily.     valsartan-hydrochlorothiazide (DIOVAN-HCT) 320-25 MG tablet TAKE 1 TABLET BY MOUTH DAILY 100 tablet 2   Facility-Administered Medications Prior to Visit  Medication Dose Route Frequency Provider Last Rate Last Admin   betamethasone acetate-betamethasone sodium phosphate (CELESTONE) injection 3 mg  3 mg Intramuscular Once Gala Lewandowsky M, DPM       betamethasone acetate-betamethasone sodium phosphate (CELESTONE) injection 3 mg  3 mg Intramuscular Once Gala Lewandowsky M, DPM         EXAM:  There were no vitals taken for this visit.  There is no height or weight on file to calculate BMI. Wt Readings from Last 3 Encounters:  01/26/23 147 lb (66.7 kg)  01/12/23 148 lb (67.1 kg)  12/28/22 148 lb (67.1 kg)    GENERAL: vitals reviewed and listed above, alert, oriented, appears well hydrated and in no acute distress HEENT: atraumatic, conjunctiva  clear, no obvious abnormalities on inspection of external nose and ears OP : no lesion edema or exudate  NECK: no obvious masses on inspection palpation  LUNGS: clear to auscultation bilaterally, no wheezes, rales or rhonchi, good air  movement CV: HRRR, no clubbing cyanosis or  peripheral edema nl cap refill  MS: moves all extremities without noticeable focal  abnormality PSYCH: pleasant and cooperative, no obvious depression or anxiety Lab Results  Component Value Date  WBC 10.9 (H) 07/29/2022   HGB 12.4 07/29/2022   HCT 37.7 07/29/2022   PLT 331 07/29/2022   GLUCOSE 153 (H) 06/14/2022   CHOL 134 03/14/2022   TRIG 115.0 03/14/2022   HDL 47.30 03/14/2022   LDLDIRECT 101.0 03/04/2019   LDLCALC 64 03/14/2022   ALT 25 03/14/2022   AST 28 03/14/2022   NA 141 06/14/2022   K 3.9 06/14/2022   CL 102 06/14/2022   CREATININE 1.03 06/14/2022   BUN 22 06/14/2022   CO2 32 06/14/2022   TSH 2.73 03/14/2022   INR 1.12 11/19/2009   HGBA1C 6.1 (A) 12/28/2022   MICROALBUR 28.2 (H) 03/14/2022   BP Readings from Last 3 Encounters:  01/26/23 (!) 152/78  01/12/23 (!) 147/70  12/28/22 (!) 166/92    ASSESSMENT AND PLAN:  Discussed the following assessment and plan:  No diagnosis found.  -Patient advised to return or notify health care team  if  new concerns arise.  There are no Patient Instructions on file for this visit.   Neta Mends.  M.D.

## 2023-02-23 ENCOUNTER — Ambulatory Visit (INDEPENDENT_AMBULATORY_CARE_PROVIDER_SITE_OTHER): Payer: Medicare Other | Admitting: Internal Medicine

## 2023-02-23 ENCOUNTER — Encounter: Payer: Self-pay | Admitting: Internal Medicine

## 2023-02-23 DIAGNOSIS — Z79899 Other long term (current) drug therapy: Secondary | ICD-10-CM

## 2023-02-23 DIAGNOSIS — K317 Polyp of stomach and duodenum: Secondary | ICD-10-CM | POA: Diagnosis not present

## 2023-02-23 DIAGNOSIS — D509 Iron deficiency anemia, unspecified: Secondary | ICD-10-CM

## 2023-02-23 DIAGNOSIS — I10 Essential (primary) hypertension: Secondary | ICD-10-CM

## 2023-02-23 DIAGNOSIS — E039 Hypothyroidism, unspecified: Secondary | ICD-10-CM

## 2023-02-23 DIAGNOSIS — E1165 Type 2 diabetes mellitus with hyperglycemia: Secondary | ICD-10-CM

## 2023-02-23 LAB — CBC WITH DIFFERENTIAL/PLATELET
Basophils Absolute: 0 10*3/uL (ref 0.0–0.1)
Basophils Relative: 0.5 % (ref 0.0–3.0)
Eosinophils Absolute: 0.3 10*3/uL (ref 0.0–0.7)
Eosinophils Relative: 3.1 % (ref 0.0–5.0)
HCT: 37.6 % (ref 36.0–46.0)
Hemoglobin: 12.4 g/dL (ref 12.0–15.0)
Lymphocytes Relative: 27.4 % (ref 12.0–46.0)
Lymphs Abs: 2.4 10*3/uL (ref 0.7–4.0)
MCHC: 33 g/dL (ref 30.0–36.0)
MCV: 90.2 fl (ref 78.0–100.0)
Monocytes Absolute: 0.7 10*3/uL (ref 0.1–1.0)
Monocytes Relative: 7.6 % (ref 3.0–12.0)
Neutro Abs: 5.3 10*3/uL (ref 1.4–7.7)
Neutrophils Relative %: 61.4 % (ref 43.0–77.0)
Platelets: 296 10*3/uL (ref 150.0–400.0)
RBC: 4.17 Mil/uL (ref 3.87–5.11)
RDW: 13.5 % (ref 11.5–15.5)
WBC: 8.6 10*3/uL (ref 4.0–10.5)

## 2023-02-23 LAB — COMPREHENSIVE METABOLIC PANEL
ALT: 21 U/L (ref 0–35)
AST: 24 U/L (ref 0–37)
Albumin: 4.2 g/dL (ref 3.5–5.2)
Alkaline Phosphatase: 73 U/L (ref 39–117)
BUN: 30 mg/dL — ABNORMAL HIGH (ref 6–23)
CO2: 32 mEq/L (ref 19–32)
Calcium: 11.4 mg/dL — ABNORMAL HIGH (ref 8.4–10.5)
Chloride: 103 mEq/L (ref 96–112)
Creatinine, Ser: 1.29 mg/dL — ABNORMAL HIGH (ref 0.40–1.20)
GFR: 39.61 mL/min — ABNORMAL LOW (ref >60.00)
Glucose, Bld: 116 mg/dL — ABNORMAL HIGH (ref 70–99)
Potassium: 4.3 mEq/L (ref 3.5–5.1)
Sodium: 143 mEq/L (ref 135–145)
Total Bilirubin: 0.5 mg/dL (ref 0.2–1.2)
Total Protein: 7 g/dL (ref 6.0–8.3)

## 2023-02-23 LAB — IBC + FERRITIN
Ferritin: 179.5 ng/mL (ref 10.0–291.0)
Iron: 74 ug/dL (ref 42–145)
Saturation Ratios: 25.2 % (ref 20.0–50.0)
TIBC: 294 ug/dL (ref 250.0–450.0)
Transferrin: 210 mg/dL — ABNORMAL LOW (ref 212.0–360.0)

## 2023-02-23 LAB — LIPID PANEL
Cholesterol: 143 mg/dL (ref 0–200)
HDL: 40 mg/dL (ref >39.00)
LDL Cholesterol: 80 mg/dL (ref 0–99)
NonHDL: 102.64
Total CHOL/HDL Ratio: 4
Triglycerides: 112 mg/dL (ref 0.0–149.0)
VLDL: 22.4 mg/dL (ref 0.0–40.0)

## 2023-02-23 LAB — TSH: TSH: 2.2 u[IU]/mL (ref 0.35–5.50)

## 2023-02-23 MED ORDER — AMLODIPINE BESYLATE 10 MG PO TABS
10.0000 mg | ORAL_TABLET | Freq: Every day | ORAL | 2 refills | Status: DC
Start: 2023-02-23 — End: 2023-03-30

## 2023-02-23 NOTE — Patient Instructions (Signed)
Increase  amlodipine  10 mg  per day and can take all bp at the same time.   Plan ROV   in 4-6 weeks  or as needed.

## 2023-02-27 NOTE — Progress Notes (Signed)
Cholesterol blood iron and thyroid are normal. No Anemia  Kidney function  reports as decreased from last check and calcium level has increased  . Calcium levels can increase with dehydration and also the diuretic  hydrochlorothiazide in the BP med  can add to elevation.  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 I would like you to see  endocrinology consult ( not sure if any in [place] )  not urgent  . We can repeat  bmp  kidney function lab when you come back in August. Appt  Staff please do endocrinology referral if she agrees .

## 2023-03-01 ENCOUNTER — Telehealth: Payer: Self-pay | Admitting: Internal Medicine

## 2023-03-01 DIAGNOSIS — M9903 Segmental and somatic dysfunction of lumbar region: Secondary | ICD-10-CM | POA: Diagnosis not present

## 2023-03-01 DIAGNOSIS — M9904 Segmental and somatic dysfunction of sacral region: Secondary | ICD-10-CM | POA: Diagnosis not present

## 2023-03-01 DIAGNOSIS — M9905 Segmental and somatic dysfunction of pelvic region: Secondary | ICD-10-CM | POA: Diagnosis not present

## 2023-03-01 DIAGNOSIS — M5136 Other intervertebral disc degeneration, lumbar region: Secondary | ICD-10-CM | POA: Diagnosis not present

## 2023-03-01 NOTE — Telephone Encounter (Signed)
Noted  

## 2023-03-01 NOTE — Telephone Encounter (Signed)
Ok noted  

## 2023-03-01 NOTE — Telephone Encounter (Signed)
I called the patient and informed her of the results which was sent via Mychart message previously.  Message sent to Dr Fabian Sharp as patient declined referral to endocrinology and stated she will discuss this further with PCP at the upcoming visit in August.

## 2023-03-01 NOTE — Telephone Encounter (Signed)
Pt called in and requested a call back to go over her lab results from 02/23/23. She states she hasn't heard from anyone about the results yet.

## 2023-03-07 DIAGNOSIS — M9904 Segmental and somatic dysfunction of sacral region: Secondary | ICD-10-CM | POA: Diagnosis not present

## 2023-03-07 DIAGNOSIS — M9905 Segmental and somatic dysfunction of pelvic region: Secondary | ICD-10-CM | POA: Diagnosis not present

## 2023-03-07 DIAGNOSIS — M5136 Other intervertebral disc degeneration, lumbar region: Secondary | ICD-10-CM | POA: Diagnosis not present

## 2023-03-07 DIAGNOSIS — M9903 Segmental and somatic dysfunction of lumbar region: Secondary | ICD-10-CM | POA: Diagnosis not present

## 2023-03-21 DIAGNOSIS — M5136 Other intervertebral disc degeneration, lumbar region: Secondary | ICD-10-CM | POA: Diagnosis not present

## 2023-03-21 DIAGNOSIS — M9904 Segmental and somatic dysfunction of sacral region: Secondary | ICD-10-CM | POA: Diagnosis not present

## 2023-03-21 DIAGNOSIS — M9903 Segmental and somatic dysfunction of lumbar region: Secondary | ICD-10-CM | POA: Diagnosis not present

## 2023-03-21 DIAGNOSIS — M9905 Segmental and somatic dysfunction of pelvic region: Secondary | ICD-10-CM | POA: Diagnosis not present

## 2023-03-29 NOTE — Progress Notes (Unsigned)
No chief complaint on file.   HPI: Kerry Walters 77 y.o. come in for Chronic disease management   Working on ht management   inc amlodipine to 10 last visit and fu readings  Gi issues  see last note iron def  gastric poluyp  DM  A1c last 6.1 ROS: See pertinent positives and negatives per HPI.  Past Medical History:  Diagnosis Date   Allergy    Asthma due to environmental allergies    no inhaler   Diabetes mellitus without complication (HCC)    Diverticulosis of colon    GERD (gastroesophageal reflux disease)    History of esophageal dilatation    FOR STRICTURE IN 2005   History of hiatal hernia    History of hypercalcemia    SECONDARY TO HYPERPARATHYROID   History of hyperparathyroidism    PRIMARY--  S/P RIGHT PARATHYROIDECTOMY   History of kidney stones    History of recurrent UTIs    Hyperlipidemia    Hypertension    Hypothyroidism    Incomplete right bundle branch block    Iron deficiency anemia due to chronic blood loss 12/01/2021   Nephrolithiasis    RIGHT    Nocturia    PONV (postoperative nausea and vomiting)    Pre-diabetes    Sciatica of right side     Family History  Problem Relation Age of Onset   Cancer Father        brain   Cancer Son        liver   Diabetes Sister    Schizophrenia Son    Colon cancer Neg Hx    Colon polyps Neg Hx    Esophageal cancer Neg Hx    Stomach cancer Neg Hx    Pancreatic cancer Neg Hx    Rectal cancer Neg Hx     Social History   Socioeconomic History   Marital status: Married    Spouse name: Not on file   Number of children: Not on file   Years of education: Not on file   Highest education level: Not on file  Occupational History   Not on file  Tobacco Use   Smoking status: Never   Smokeless tobacco: Never  Vaping Use   Vaping status: Never Used  Substance and Sexual Activity   Alcohol use: No   Drug use: No   Sexual activity: Not on file  Other Topics Concern   Not on file  Social History  Narrative   Married   hh of 2   4 dogs   G2 P2   Water exercise.   Retired    [employer]    great grandchild          Social Determinants of Corporate investment banker Strain: Low Risk  (03/24/2020)   Overall Financial Resource Strain (CARDIA)    Difficulty of Paying Living Expenses: Not hard at all  Food Insecurity: No Food Insecurity (02/08/2023)   Hunger Vital Sign    Worried About Running Out of Food in the Last Year: Never true    Ran Out of Food in the Last Year: Never true  Transportation Needs: No Transportation Needs (03/24/2020)   PRAPARE - Administrator, Civil Service (Medical): No    Lack of Transportation (Non-Medical): No  Physical Activity: Not on file  Stress: Not on file  Social Connections: Not on file    Outpatient Medications Prior to Visit  Medication Sig Dispense Refill   acetaminophen (  TYLENOL) 500 MG tablet Take 500-1,000 mg by mouth every 6 (six) hours as needed for moderate pain.     amLODipine (NORVASC) 10 MG tablet Take 1 tablet (10 mg total) by mouth daily. 30 tablet 2   atorvastatin (LIPITOR) 20 MG tablet TAKE 1 TABLET BY MOUTH IN THE  EVENING 100 tablet 2   cetirizine (ZYRTEC) 10 MG tablet Take 10 mg by mouth daily as needed for allergies.     Cholecalciferol (VITAMIN D) 50 MCG (2000 UT) tablet Take 6,000 Units by mouth daily.     conjugated estrogens (PREMARIN) vaginal cream Place vaginally as directed. Use 2-3 times weekly  prnPlace 1 Applicatorful vaginally as directed. Use 2-3 times weekly  prn (Patient taking differently: Place vaginally as directed. 1 Applicatorful vaginally as directed. Use 2-3 times weekly  prn) 42 g 4   Cyanocobalamin (B-12) 3000 MCG CAPS Take 3,000 mcg by mouth daily.     ferrous sulfate 325 (65 FE) MG tablet Take 1 tablet (325 mg total) by mouth every other day. (Patient taking differently: Take 325 mg by mouth daily.) 15 tablet 11   fluticasone (FLONASE) 50 MCG/ACT nasal spray Place 1 spray into both nostrils  daily as needed for allergies or rhinitis.     gabapentin (NEURONTIN) 100 MG capsule TAKE 1 TO 3 CAPSULES BY MOUTH AT BEDTIME AS DIRECTED (Patient taking differently: Take 100-200 mg by mouth daily as needed (pain).) 300 capsule 2   latanoprost (XALATAN) 0.005 % ophthalmic solution Place 1 drop into both eyes at bedtime.     levothyroxine (SYNTHROID) 75 MCG tablet TAKE 1 TABLET BY MOUTH DAILY 100 tablet 2   magnesium oxide (MAG-OX) 400 (240 Mg) MG tablet Take 800 mg by mouth daily.     metFORMIN (GLUCOPHAGE-XR) 500 MG 24 hr tablet TAKE 3 TABLETS BY MOUTH DAILY 300 tablet 2   OVER THE COUNTER MEDICATION Take 1 tablet by mouth daily. Total Beets supplements     pantoprazole (PROTONIX) 40 MG tablet Take 1 tablet (40 mg total) by mouth 2 (two) times daily before a meal. 60 tablet 12   Turmeric 500 MG CAPS Take 1,000 mg by mouth daily.     valsartan-hydrochlorothiazide (DIOVAN-HCT) 320-25 MG tablet TAKE 1 TABLET BY MOUTH DAILY 100 tablet 2   Facility-Administered Medications Prior to Visit  Medication Dose Route Frequency Provider Last Rate Last Admin   betamethasone acetate-betamethasone sodium phosphate (CELESTONE) injection 3 mg  3 mg Intramuscular Once Gala Lewandowsky M, DPM       betamethasone acetate-betamethasone sodium phosphate (CELESTONE) injection 3 mg  3 mg Intramuscular Once Gala Lewandowsky M, DPM         EXAM:  There were no vitals taken for this visit.  There is no height or weight on file to calculate BMI. Wt Readings from Last 3 Encounters:  02/23/23 144 lb 6.4 oz (65.5 kg)  01/26/23 147 lb (66.7 kg)  01/12/23 148 lb (67.1 kg)    GENERAL: vitals reviewed and listed above, alert, oriented, appears well hydrated and in no acute distress HEENT: atraumatic, conjunctiva  clear, no obvious abnormalities on inspection of external nose and ears OP : no lesion edema or exudate  NECK: no obvious masses on inspection palpation  LUNGS: clear to auscultation bilaterally, no wheezes, rales or  rhonchi, good air movement CV: HRRR, no clubbing cyanosis or  peripheral edema nl cap refill  MS: moves all extremities without noticeable focal  abnormality PSYCH: pleasant and cooperative, no obvious depression or anxiety  Lab Results  Component Value Date   WBC 8.6 02/23/2023   HGB 12.4 02/23/2023   HCT 37.6 02/23/2023   PLT 296.0 02/23/2023   GLUCOSE 116 (H) 02/23/2023   CHOL 143 02/23/2023   TRIG 112.0 02/23/2023   HDL 40.00 02/23/2023   LDLDIRECT 101.0 03/04/2019   LDLCALC 80 02/23/2023   ALT 21 02/23/2023   AST 24 02/23/2023   NA 143 02/23/2023   K 4.3 02/23/2023   CL 103 02/23/2023   CREATININE 1.29 (H) 02/23/2023   BUN 30 (H) 02/23/2023   CO2 32 02/23/2023   TSH 2.20 02/23/2023   INR 1.12 11/19/2009   HGBA1C 6.1 (A) 12/28/2022   MICROALBUR 28.2 (H) 03/14/2022   BP Readings from Last 3 Encounters:  02/23/23 (!) 158/76  01/26/23 (!) 152/78  01/12/23 (!) 147/70    ASSESSMENT AND PLAN:  Discussed the following assessment and plan:  No diagnosis found.  -Patient advised to return or notify health care team  if  new concerns arise.  There are no Patient Instructions on file for this visit.   Neta Mends.  M.D.

## 2023-03-30 ENCOUNTER — Ambulatory Visit (INDEPENDENT_AMBULATORY_CARE_PROVIDER_SITE_OTHER): Payer: Medicare Other | Admitting: Internal Medicine

## 2023-03-30 ENCOUNTER — Encounter: Payer: Self-pay | Admitting: Internal Medicine

## 2023-03-30 VITALS — BP 160/66 | HR 69 | Temp 97.8°F | Ht 61.5 in | Wt 144.6 lb

## 2023-03-30 DIAGNOSIS — I10 Essential (primary) hypertension: Secondary | ICD-10-CM | POA: Diagnosis not present

## 2023-03-30 DIAGNOSIS — T887XXA Unspecified adverse effect of drug or medicament, initial encounter: Secondary | ICD-10-CM | POA: Diagnosis not present

## 2023-03-30 DIAGNOSIS — Z79899 Other long term (current) drug therapy: Secondary | ICD-10-CM

## 2023-03-30 DIAGNOSIS — R944 Abnormal results of kidney function studies: Secondary | ICD-10-CM | POA: Diagnosis not present

## 2023-03-30 LAB — BASIC METABOLIC PANEL
BUN: 22 mg/dL (ref 6–23)
CO2: 31 mEq/L (ref 19–32)
Calcium: 10.9 mg/dL — ABNORMAL HIGH (ref 8.4–10.5)
Chloride: 101 mEq/L (ref 96–112)
Creatinine, Ser: 1.19 mg/dL (ref 0.40–1.20)
GFR: 43.61 mL/min — ABNORMAL LOW (ref >60.00)
Glucose, Bld: 96 mg/dL (ref 70–99)
Potassium: 4 mEq/L (ref 3.5–5.1)
Sodium: 139 mEq/L (ref 135–145)

## 2023-03-30 LAB — MICROALBUMIN / CREATININE URINE RATIO
Creatinine,U: 48.3 mg/dL
Microalb Creat Ratio: 22.9 mg/g (ref 0.0–30.0)
Microalb, Ur: 11.1 mg/dL — ABNORMAL HIGH (ref 0.0–1.9)

## 2023-03-30 MED ORDER — CARVEDILOL 12.5 MG PO TABS
6.2500 mg | ORAL_TABLET | Freq: Two times a day (BID) | ORAL | 3 refills | Status: DC
Start: 2023-03-30 — End: 2023-05-11

## 2023-03-30 NOTE — Patient Instructions (Signed)
Stop the amlodipine because although seemed to work for home BP control causing swelling. Begin carvedilol  1/2 of 12.5 mg ( 6.25mg ) twice a day )  After 2 weeks if not controlling bp increase toe 12.5 mg twice a day   We may have to stop the hydrochlorothiazide part of the valsartan  but remain on the 320 portion. Because hydrochlorothiazide can be associated with elevated calcium.   I will do a referral to endocrinology about the calcium .

## 2023-03-30 NOTE — Assessment & Plan Note (Signed)
Reports controled bp at home 130 range with diovanhctz and amlodipine 10 but has had se with swelling and 5 mg NE bp up in office today and poss wc effect .   Crea lat check was up.  Will stop amlodipine  and add carvedilol 6.25 in to 12.5 as indicated bid ( see rx)  May have to stop the hydrochlorothiazide because of hypercalcemia...Marland KitchenMarland Kitchen

## 2023-04-03 DIAGNOSIS — M9905 Segmental and somatic dysfunction of pelvic region: Secondary | ICD-10-CM | POA: Diagnosis not present

## 2023-04-03 DIAGNOSIS — M9904 Segmental and somatic dysfunction of sacral region: Secondary | ICD-10-CM | POA: Diagnosis not present

## 2023-04-03 DIAGNOSIS — M9903 Segmental and somatic dysfunction of lumbar region: Secondary | ICD-10-CM | POA: Diagnosis not present

## 2023-04-03 DIAGNOSIS — M5136 Other intervertebral disc degeneration, lumbar region: Secondary | ICD-10-CM | POA: Diagnosis not present

## 2023-04-19 DIAGNOSIS — M5136 Other intervertebral disc degeneration, lumbar region: Secondary | ICD-10-CM | POA: Diagnosis not present

## 2023-04-19 DIAGNOSIS — M9903 Segmental and somatic dysfunction of lumbar region: Secondary | ICD-10-CM | POA: Diagnosis not present

## 2023-04-19 DIAGNOSIS — M9905 Segmental and somatic dysfunction of pelvic region: Secondary | ICD-10-CM | POA: Diagnosis not present

## 2023-04-19 DIAGNOSIS — M9904 Segmental and somatic dysfunction of sacral region: Secondary | ICD-10-CM | POA: Diagnosis not present

## 2023-04-24 NOTE — Progress Notes (Signed)
gfr   function is  still down   I want to  discuss at your upcoming visit in September ...consider adding  medication that helps blood sugar and helps preserve kidney function (   farxiga jardiance type medication  )  . Let her know we Will address all at upcoming visit .

## 2023-04-25 ENCOUNTER — Telehealth: Payer: Self-pay | Admitting: Internal Medicine

## 2023-04-25 NOTE — Telephone Encounter (Signed)
Pt called, returning CMA's call regarding labs. CMA was with a patient. Pt asked that CMA call back at her earliest convenience. 

## 2023-04-27 NOTE — Telephone Encounter (Signed)
Spoke to pt. See lab result note.

## 2023-05-08 DIAGNOSIS — M9904 Segmental and somatic dysfunction of sacral region: Secondary | ICD-10-CM | POA: Diagnosis not present

## 2023-05-08 DIAGNOSIS — M9903 Segmental and somatic dysfunction of lumbar region: Secondary | ICD-10-CM | POA: Diagnosis not present

## 2023-05-08 DIAGNOSIS — M9905 Segmental and somatic dysfunction of pelvic region: Secondary | ICD-10-CM | POA: Diagnosis not present

## 2023-05-08 DIAGNOSIS — M5136 Other intervertebral disc degeneration, lumbar region: Secondary | ICD-10-CM | POA: Diagnosis not present

## 2023-05-09 ENCOUNTER — Other Ambulatory Visit: Payer: Self-pay | Admitting: Internal Medicine

## 2023-05-10 NOTE — Progress Notes (Signed)
Chief Complaint  Patient presents with   Medical Management of Chronic Issues    6-8 wks BP follow up. Pt reports her BP range is Mid 150/50-60    HPI: Kerry Walters 77 y.o. come in for Chronic disease management  fu  Bp management  Now taking added carvedilol 12.5 bid in addition to valsartan hydrochlorothiazide   Dec gfr  noted by cr and lower cystatin c  neg  urine protein screen  BP: about 150 +at home  no se of med  Gabapentin  hs  if inc dose has se the next am .  Social stress over time son in prison for 22 years in [place]  and unable to get any info about how doing rx other?    Has schizophrenia  Sleep always effected  takes some otc to try to help   husband has other issues   No cv pulm sx at this time  Thyroid 75 mcg per day  Has urologist hx of utis and stones  ROS: See pertinent positives and negatives per HPI.  Past Medical History:  Diagnosis Date   Allergy    Asthma due to environmental allergies    no inhaler   Diabetes mellitus without complication (HCC)    Diverticulosis of colon    GERD (gastroesophageal reflux disease)    History of esophageal dilatation    FOR STRICTURE IN 2005   History of hiatal hernia    History of hypercalcemia    SECONDARY TO HYPERPARATHYROID   History of hyperparathyroidism    PRIMARY--  S/P RIGHT PARATHYROIDECTOMY   History of kidney stones    History of recurrent UTIs    Hyperlipidemia    Hypertension    Hypothyroidism    Incomplete right bundle branch block    Iron deficiency anemia due to chronic blood loss 12/01/2021   Nephrolithiasis    RIGHT    Nocturia    PONV (postoperative nausea and vomiting)    Pre-diabetes    Sciatica of right side     Family History  Problem Relation Age of Onset   Cancer Father        brain   Cancer Son        liver   Diabetes Sister    Schizophrenia Son    Colon cancer Neg Hx    Colon polyps Neg Hx    Esophageal cancer Neg Hx    Stomach cancer Neg Hx    Pancreatic cancer  Neg Hx    Rectal cancer Neg Hx     Social History   Socioeconomic History   Marital status: Married    Spouse name: Not on file   Number of children: Not on file   Years of education: Not on file   Highest education level: Not on file  Occupational History   Not on file  Tobacco Use   Smoking status: Never   Smokeless tobacco: Never  Vaping Use   Vaping status: Never Used  Substance and Sexual Activity   Alcohol use: No   Drug use: No   Sexual activity: Not on file  Other Topics Concern   Not on file  Social History Narrative   Married   hh of 2   4 dogs   G2 P2   Water exercise.   Retired    [employer]    great grandchild          Social Determinants of Corporate investment banker Strain: Low  Risk  (03/24/2020)   Overall Financial Resource Strain (CARDIA)    Difficulty of Paying Living Expenses: Not hard at all  Food Insecurity: No Food Insecurity (02/08/2023)   Hunger Vital Sign    Worried About Running Out of Food in the Last Year: Never true    Ran Out of Food in the Last Year: Never true  Transportation Needs: No Transportation Needs (03/24/2020)   PRAPARE - Administrator, Civil Service (Medical): No    Lack of Transportation (Non-Medical): No  Physical Activity: Not on file  Stress: Not on file  Social Connections: Not on file    Outpatient Medications Prior to Visit  Medication Sig Dispense Refill   acetaminophen (TYLENOL) 500 MG tablet Take 500-1,000 mg by mouth every 6 (six) hours as needed for moderate pain.     atorvastatin (LIPITOR) 20 MG tablet TAKE 1 TABLET BY MOUTH IN THE  EVENING 100 tablet 2   cetirizine (ZYRTEC) 10 MG tablet Take 10 mg by mouth daily as needed for allergies.     Cholecalciferol (VITAMIN D) 50 MCG (2000 UT) tablet Take 6,000 Units by mouth daily.     conjugated estrogens (PREMARIN) vaginal cream Place vaginally as directed. Use 2-3 times weekly  prnPlace 1 Applicatorful vaginally as directed. Use 2-3 times weekly  prn  (Patient taking differently: Place vaginally as directed. 1 Applicatorful vaginally as directed. Use 2-3 times weekly  prn) 42 g 4   Cyanocobalamin (B-12) 3000 MCG CAPS Take 3,000 mcg by mouth daily.     ferrous sulfate 325 (65 FE) MG tablet Take 1 tablet (325 mg total) by mouth every other day. (Patient taking differently: Take 325 mg by mouth daily.) 15 tablet 11   fluticasone (FLONASE) 50 MCG/ACT nasal spray Place 1 spray into both nostrils daily as needed for allergies or rhinitis.     gabapentin (NEURONTIN) 100 MG capsule TAKE 1 TO 3 CAPSULES BY MOUTH AT BEDTIME AS DIRECTED (Patient taking differently: Take 100-200 mg by mouth daily as needed (pain).) 300 capsule 2   latanoprost (XALATAN) 0.005 % ophthalmic solution Place 1 drop into both eyes at bedtime.     levothyroxine (SYNTHROID) 75 MCG tablet TAKE 1 TABLET BY MOUTH DAILY 100 tablet 2   magnesium oxide (MAG-OX) 400 (240 Mg) MG tablet Take 800 mg by mouth daily.     metFORMIN (GLUCOPHAGE-XR) 500 MG 24 hr tablet TAKE 3 TABLETS BY MOUTH DAILY 300 tablet 2   OVER THE COUNTER MEDICATION Take 1 tablet by mouth daily. Total Beets supplements     Turmeric 500 MG CAPS Take 1,000 mg by mouth daily.     valsartan-hydrochlorothiazide (DIOVAN-HCT) 320-25 MG tablet TAKE 1 TABLET BY MOUTH DAILY 100 tablet 2   carvedilol (COREG) 12.5 MG tablet Take 0.5 tablets (6.25 mg total) by mouth 2 (two) times daily with a meal. After 2 weeks can increase to 1 orally twice a day for BP control 60 tablet 3   pantoprazole (PROTONIX) 40 MG tablet Take 1 tablet (40 mg total) by mouth 2 (two) times daily before a meal. 60 tablet 12   Facility-Administered Medications Prior to Visit  Medication Dose Route Frequency Provider Last Rate Last Admin   betamethasone acetate-betamethasone sodium phosphate (CELESTONE) injection 3 mg  3 mg Intramuscular Once Gala Lewandowsky M, DPM       betamethasone acetate-betamethasone sodium phosphate (CELESTONE) injection 3 mg  3 mg  Intramuscular Once Felecia Shelling, DPM  EXAM:  BP (!) 176/82 (BP Location: Left Arm, Cuff Size: Normal)   Pulse (!) 55   Temp 97.9 F (36.6 C) (Oral)   Ht 5' 1.5" (1.562 m)   Wt 146 lb 6.4 oz (66.4 kg)   SpO2 98%   BMI 27.21 kg/m   Body mass index is 27.21 kg/m.  GENERAL: vitals reviewed and listed above, alert, oriented, appears well hydrated and in no acute distress HEENT: atraumatic, conjunctiva  clear, no obvious abnormalities on inspection of external nose and ears  NECK: no obvious masses on inspection palpation  CV: HRRR, no clubbing cyanosis or  peripheral edema nl cap refill  MS: moves all extremities without noticeable focal  abnormality PSYCH: pleasant and cooperative, no obvious depression or anxiety but stress ed when discussing her sons situation Lab Results  Component Value Date   WBC 8.6 02/23/2023   HGB 12.4 02/23/2023   HCT 37.6 02/23/2023   PLT 296.0 02/23/2023   GLUCOSE 96 03/30/2023   CHOL 143 02/23/2023   TRIG 112.0 02/23/2023   HDL 40.00 02/23/2023   LDLDIRECT 101.0 03/04/2019   LDLCALC 80 02/23/2023   ALT 21 02/23/2023   AST 24 02/23/2023   NA 139 03/30/2023   K 4.0 03/30/2023   CL 101 03/30/2023   CREATININE 1.19 03/30/2023   BUN 22 03/30/2023   CO2 31 03/30/2023   TSH 2.20 02/23/2023   INR 1.12 11/19/2009   HGBA1C 6.1 (A) 12/28/2022   MICROALBUR 11.1 (H) 03/30/2023   BP Readings from Last 3 Encounters:  05/11/23 (!) 176/82  03/30/23 (!) 160/66  02/23/23 (!) 158/76   See ct scan Sep 22 2007 pth dr Gerrit Friends  parathyroidectomy  ASSESSMENT AND PLAN:  Discussed the following assessment and plan:  Essential hypertension - not controlled yet on valsartan hctz, carvedilol inc dose ( se of norvasc)  Medication management  Hypothyroidism, unspecified type - last check in range  Stage 3b chronic kidney disease (HCC)  Type 2 diabetes mellitus with hyperglycemia, without long-term current use of insulin (HCC) -  controlled  Stress - family situation takin gotc for sleep  No se of current meds . But not controlled  150 range at home. Inc to 25 bid carvedilol and may not be enough for control  consider adding  cloinidine if stays high  Dec gftr but no proteinuira  has hx of sig utis and stones   in past  so not a good candidate for  sglt2 and declines idea of this . No obv active renal disease but consider secondary if on going .  Prefers no referral unless abs necessary at this time willing to try meds . Combos  Addressed difficulty with the stress  of sons situation   Consider ht clinic  ( and or nephrology consult)   and close fu  -Patient advised to return or notify health care team  if  new concerns arise.  Patient Instructions  Lets increase to 25 mg twice a day of carvedilol . ( 2 x 12.5 twice a day eventually) .  Plan fu in 6-8 weeks or earlier .  If not coming down . If BP  is consistently 160 and above  contact us and we will add med such as clonidine .      Neta Mends. Jahaad Penado M.D.

## 2023-05-11 ENCOUNTER — Other Ambulatory Visit: Payer: Self-pay

## 2023-05-11 ENCOUNTER — Ambulatory Visit (INDEPENDENT_AMBULATORY_CARE_PROVIDER_SITE_OTHER): Payer: Medicare Other | Admitting: Internal Medicine

## 2023-05-11 VITALS — BP 176/82 | HR 55 | Temp 97.9°F | Ht 61.5 in | Wt 146.4 lb

## 2023-05-11 DIAGNOSIS — E039 Hypothyroidism, unspecified: Secondary | ICD-10-CM | POA: Diagnosis not present

## 2023-05-11 DIAGNOSIS — E1165 Type 2 diabetes mellitus with hyperglycemia: Secondary | ICD-10-CM

## 2023-05-11 DIAGNOSIS — Z79899 Other long term (current) drug therapy: Secondary | ICD-10-CM

## 2023-05-11 DIAGNOSIS — N1832 Chronic kidney disease, stage 3b: Secondary | ICD-10-CM

## 2023-05-11 DIAGNOSIS — I10 Essential (primary) hypertension: Secondary | ICD-10-CM | POA: Diagnosis not present

## 2023-05-11 DIAGNOSIS — F439 Reaction to severe stress, unspecified: Secondary | ICD-10-CM

## 2023-05-11 MED ORDER — PANTOPRAZOLE SODIUM 40 MG PO TBEC: 40.0000 mg | DELAYED_RELEASE_TABLET | Freq: Two times a day (BID) | ORAL | 2 refills | Status: DC

## 2023-05-11 MED ORDER — CARVEDILOL 25 MG PO TABS: 25.0000 mg | ORAL_TABLET | Freq: Two times a day (BID) | ORAL | 3 refills | Status: DC

## 2023-05-11 NOTE — Patient Instructions (Addendum)
Lets increase to 25 mg twice a day of carvedilol . ( 2 x 12.5 twice a day eventually) .  Plan fu in 6-8 weeks or earlier .  If not coming down . If BP  is consistently 160 and above  contact us and we will add med such as clonidine .

## 2023-05-11 NOTE — Telephone Encounter (Signed)
Pantoprazole refilled to her mail order pharmacy as incoming fax requested.

## 2023-05-12 ENCOUNTER — Encounter: Payer: Self-pay | Admitting: Internal Medicine

## 2023-05-16 DIAGNOSIS — M9903 Segmental and somatic dysfunction of lumbar region: Secondary | ICD-10-CM | POA: Diagnosis not present

## 2023-05-16 DIAGNOSIS — M9905 Segmental and somatic dysfunction of pelvic region: Secondary | ICD-10-CM | POA: Diagnosis not present

## 2023-05-16 DIAGNOSIS — M5136 Other intervertebral disc degeneration, lumbar region: Secondary | ICD-10-CM | POA: Diagnosis not present

## 2023-05-16 DIAGNOSIS — M9904 Segmental and somatic dysfunction of sacral region: Secondary | ICD-10-CM | POA: Diagnosis not present

## 2023-05-26 ENCOUNTER — Encounter: Payer: Self-pay | Admitting: Podiatry

## 2023-05-26 ENCOUNTER — Ambulatory Visit (INDEPENDENT_AMBULATORY_CARE_PROVIDER_SITE_OTHER): Payer: Medicare Other

## 2023-05-26 ENCOUNTER — Ambulatory Visit: Payer: Medicare Other | Admitting: Podiatry

## 2023-05-26 VITALS — BP 209/75 | HR 55

## 2023-05-26 DIAGNOSIS — M109 Gout, unspecified: Secondary | ICD-10-CM

## 2023-05-26 DIAGNOSIS — M898X9 Other specified disorders of bone, unspecified site: Secondary | ICD-10-CM | POA: Diagnosis not present

## 2023-05-26 MED ORDER — COLCHICINE 0.6 MG PO TABS
0.6000 mg | ORAL_TABLET | Freq: Every day | ORAL | 0 refills | Status: DC
Start: 2023-05-26 — End: 2023-09-05

## 2023-05-27 LAB — URIC ACID: Uric Acid, Serum: 8.4 mg/dL — ABNORMAL HIGH (ref 2.5–7.0)

## 2023-05-28 ENCOUNTER — Encounter: Payer: Self-pay | Admitting: Podiatry

## 2023-05-28 NOTE — Progress Notes (Signed)
Chief Complaint  Patient presents with   Foot Pain    "I think I have Gout again.  I saw him several years ago." N - pain bunion L - right D - Saturday night O - suddenly, about the same C - sharp pain, tender, sore A - touch, walking T - none    HPI: 77 y.o. female presents today with complaint of severe pain to the right foot of the right great toe joint.  She reports redness and swelling to this area and to the forefoot.  She states that the area is extremely sensitive to touch.  She reports history of prior gout attack approximately 5 years ago.  She reports sudden onset nearly a week prior.  She denies any nausea, vomiting, fever, chills, chest pain, shortness of breath.  She reports history of well-controlled diabetes.  She was previously seen in this clinic years prior.  Past Medical History:  Diagnosis Date   Allergy    Asthma due to environmental allergies    no inhaler   Diabetes mellitus without complication (HCC)    Diverticulosis of colon    GERD (gastroesophageal reflux disease)    History of esophageal dilatation    FOR STRICTURE IN 2005   History of hiatal hernia    History of hypercalcemia    SECONDARY TO HYPERPARATHYROID   History of hyperparathyroidism    PRIMARY--  S/P RIGHT PARATHYROIDECTOMY   History of kidney stones    History of recurrent UTIs    Hyperlipidemia    Hypertension    Hypothyroidism    Incomplete right bundle branch block    Iron deficiency anemia due to chronic blood loss 12/01/2021   Nephrolithiasis    RIGHT    Nocturia    PONV (postoperative nausea and vomiting)    Pre-diabetes    Sciatica of right side     Past Surgical History:  Procedure Laterality Date   APPENDECTOMY  1974   COLONOSCOPY WITH PROPOFOL N/A 01/12/2023   Procedure: COLONOSCOPY WITH PROPOFOL;  Surgeon: Lemar Lofty., MD;  Location: [facility];  Service: Gastroenterology;  Laterality: N/A;   CYSTOSCOPY WITH URETEROSCOPY AND STENT PLACEMENT Right  11/11/2014   Procedure: CYSTOSCOPY WITH URETEROSCOPY AND STENT PLACEMENT;  Surgeon: Crist Fat, MD;  Location: [facility];  Service: Urology;  Laterality: Right;   ENDOSCOPIC MUCOSAL RESECTION N/A 01/12/2023   Procedure: ENDOSCOPIC MUCOSAL RESECTION;  Surgeon: Meridee Score Netty Starring., MD;  Location: [facility];  Service: Gastroenterology;  Laterality: N/A;   ESOPHAGOGASTRODUODENOSCOPY (EGD) WITH PROPOFOL N/A 01/12/2023   Procedure: ESOPHAGOGASTRODUODENOSCOPY (EGD) WITH PROPOFOL;  Surgeon: Meridee Score Netty Starring., MD;  Location: [facility];  Service: Gastroenterology;  Laterality: N/A;   EXCISION URETHRAL CARUNCLE  1981   Open procedure   HEMOSTASIS CLIP PLACEMENT  01/12/2023   Procedure: HEMOSTASIS CLIP PLACEMENT;  Surgeon: Lemar Lofty., MD;  Location: [facility];  Service: Gastroenterology;;   HEMOSTASIS CONTROL  01/12/2023   Procedure: HEMOSTASIS CONTROL;  Surgeon: Lemar Lofty., MD;  Location: [facility];  Service: Gastroenterology;;   KNEE ARTHROSCOPY Left april 2010   POLYPECTOMY  01/12/2023   Procedure: POLYPECTOMY;  Surgeon: Lemar Lofty., MD;  Location: [facility];  Service: Gastroenterology;;  gastric and colon   REMOVAL RIP  1973   First Rib on Right side-- for impingement   RIGHT INFERIOR PARATHYROIDECTOMY, MINIMALLY INVASIVE  08-14-2008   SUBMUCOSAL LIFTING INJECTION  01/12/2023   Procedure: SUBMUCOSAL LIFTING INJECTION;  Surgeon: Meridee Score,  Netty Starring., MD;  Location: [facility];  Service: Gastroenterology;;   TONSILLECTOMY  as child   TOTAL KNEE ARTHROPLASTY Left 11-17-2009   TUBAL LIGATION  1973   VAGINAL HYSTERECTOMY  1995   w/ Bilateral Salpingoophorectomy    Allergies  Allergen Reactions   Contrast Media [Iodinated Contrast Media] Shortness Of Breath    Pt states in the 80's at Urologist visit had asthma attack reaction to IVP dye. Told not to have it again.    Amlodipine Other (See Comments)    Swelling     Codeine Nausea Only   Shrimp [Shellfish Allergy] Swelling    Only Shrimp   Topamax [Topiramate] Rash    Review of Systems  Musculoskeletal:  Positive for joint pain.  All other systems reviewed and are negative.     Physical Exam: Vitals:   05/26/23 1204  BP: (!) 209/75  Pulse: (!) 55    General: The patient is alert and oriented x3 in no acute distress.  Dermatology: Right foot redness to the first metatarsophalangeal joint region with edema involving the right first MPJ and present to the forefoot.  No open wounds or lesions.  No ascending erythema.  Affected area is warm to touch.  Vascular: Palpable pedal pulses bilaterally. Capillary refill within normal limits.  No diffuse edema appreciated  Neurological: Light touch sensation grossly intact bilateral feet.   Musculoskeletal Exam: Mild bunion deformity appreciated.  Pain with great toe range of motion  Radiographic Exam: Right foot 05/26/2023 Normal osseous mineralization.  Mild Peri-articular spurring of the right first MPJ. Possible irregularity of tibial sesamoid, bipartate appearance vs fracture, however no other signs of fracture or dislocation otherwise noted.   Assessment/Plan of Care: 1. Acute gout of right foot, unspecified cause      Meds ordered this encounter  Medications   colchicine 0.6 MG tablet    Sig: Take 1 tablet (0.6 mg total) by mouth daily.    Dispense:  60 tablet    Refill:  0   None  Discussed clinical findings with patient today.  Plan: - Risk, benefits alternative therapies were discussed with patient at length.  Recommended intra-articular corticosteroid injection of the right first metatarsophalangeal joint.  Verbal consent was obtained. - Skin was prepped with Betadine solution.  Right first MPJ was injected using 2 cc of a one-to-one ratio of 2% lidocaine plain and Kenalog 10.  Injection site dressed with Band-Aid -Started patient on oral colchicine to treat gout pain -Patient  reports remote history of prior gout attack.  Uric acid blood test ordered in an effort to confirm diagnosis. -Follow-up in approximately 2 weeks to review blood work, assess symptoms and progression. Sesamoid pathology may be considered if pain does not resolve. -Discussed signs and symptoms of infection.  Advised patient to contact office if the symptoms occur or if pain worsens.   Sharisa Toves L. Marchia Bond, AACFAS [facility]     [address] [place], [place] [address]                Office [phone]  Fax [phone]

## 2023-05-29 ENCOUNTER — Other Ambulatory Visit: Payer: Self-pay | Admitting: Podiatry

## 2023-05-29 DIAGNOSIS — M109 Gout, unspecified: Secondary | ICD-10-CM

## 2023-05-29 MED ORDER — PREDNISONE 10 MG PO TABS
ORAL_TABLET | ORAL | 0 refills | Status: AC
Start: 2023-05-29 — End: 2023-06-09

## 2023-05-29 NOTE — Progress Notes (Signed)
Contacted patient via phone to discuss Uric acid results.  She states she hasn't been able to tolerate the colchicine, and that she cannot take prolonged NSAIDs. Called in 12 day taper dose of oral Prednisone. Keep follow up appointment in approximately 2 weeks.

## 2023-05-31 DIAGNOSIS — M9905 Segmental and somatic dysfunction of pelvic region: Secondary | ICD-10-CM | POA: Diagnosis not present

## 2023-05-31 DIAGNOSIS — M9904 Segmental and somatic dysfunction of sacral region: Secondary | ICD-10-CM | POA: Diagnosis not present

## 2023-05-31 DIAGNOSIS — M9903 Segmental and somatic dysfunction of lumbar region: Secondary | ICD-10-CM | POA: Diagnosis not present

## 2023-05-31 DIAGNOSIS — M5136 Other intervertebral disc degeneration, lumbar region with discogenic back pain only: Secondary | ICD-10-CM | POA: Diagnosis not present

## 2023-06-08 ENCOUNTER — Telehealth: Payer: Self-pay | Admitting: Internal Medicine

## 2023-06-08 NOTE — Telephone Encounter (Signed)
Pt says she will run out of carvedilol (COREG) 25 MG tablet prior to her next appt and requesting refills.  Rx was written 05/11/23, says pharmacy will not fill for some reason.

## 2023-06-12 ENCOUNTER — Other Ambulatory Visit: Payer: Self-pay | Admitting: Family

## 2023-06-12 MED ORDER — CARVEDILOL 25 MG PO TABS: 25.0000 mg | ORAL_TABLET | Freq: Two times a day (BID) | ORAL | 3 refills | Status: DC

## 2023-06-13 ENCOUNTER — Telehealth: Payer: Self-pay | Admitting: Internal Medicine

## 2023-06-13 NOTE — Telephone Encounter (Signed)
Contacted to [facility] and spoke to [place] L. Inform her of provider's approval. Verbalized understanding.

## 2023-06-13 NOTE — Telephone Encounter (Signed)
Requesting manufacturer change from [employer] to [facility]

## 2023-06-21 NOTE — Progress Notes (Signed)
Chief Complaint  Patient presents with   Medical Management of Chronic Issues    HPI: Kerry Walters 77 y.o. come in for working   in ht bp and  dm  Gout   from podiatry still on prednsione  had painful great toe  doing better  but on prednisone dosing another 1-2 weeks .  Doing ok  HT Had inc carvedilol    vlasartan hydrochlorothiazide  still bp in 150 + range  No new sx  stsress the same   feels pretty goo otherwise  ROS: See pertinent positives and negatives per HPI.  Past Medical History:  Diagnosis Date   Allergy    Asthma due to environmental allergies    no inhaler   Diabetes mellitus without complication (HCC)    Diverticulosis of colon    GERD (gastroesophageal reflux disease)    History of esophageal dilatation    FOR STRICTURE IN 2005   History of hiatal hernia    History of hypercalcemia    SECONDARY TO HYPERPARATHYROID   History of hyperparathyroidism    PRIMARY--  S/P RIGHT PARATHYROIDECTOMY   History of kidney stones    History of recurrent UTIs    Hyperlipidemia    Hypertension    Hypothyroidism    Incomplete right bundle branch block    Iron deficiency anemia due to chronic blood loss 12/01/2021   Nephrolithiasis    RIGHT    Nocturia    PONV (postoperative nausea and vomiting)    Pre-diabetes    Sciatica of right side     Family History  Problem Relation Age of Onset   Cancer Father        brain   Cancer Son        liver   Diabetes Sister    Schizophrenia Son    Colon cancer Neg Hx    Colon polyps Neg Hx    Esophageal cancer Neg Hx    Stomach cancer Neg Hx    Pancreatic cancer Neg Hx    Rectal cancer Neg Hx     Social History   Socioeconomic History   Marital status: Married    Spouse name: Not on file   Number of children: Not on file   Years of education: Not on file   Highest education level: Not on file  Occupational History   Not on file  Tobacco Use   Smoking status: Never   Smokeless tobacco: Never  Vaping Use   Vaping  status: Never Used  Substance and Sexual Activity   Alcohol use: No   Drug use: No   Sexual activity: Not on file  Other Topics Concern   Not on file  Social History Narrative   Married   hh of 2   4 dogs   G2 P2   Water exercise.   Retired    [employer]    great grandchild          Social Determinants of Corporate investment banker Strain: Low Risk  (03/24/2020)   Overall Financial Resource Strain (CARDIA)    Difficulty of Paying Living Expenses: Not hard at all  Food Insecurity: No Food Insecurity (02/08/2023)   Hunger Vital Sign    Worried About Running Out of Food in the Last Year: Never true    Ran Out of Food in the Last Year: Never true  Transportation Needs: No Transportation Needs (03/24/2020)   PRAPARE - Administrator, Civil Service (Medical): No  Lack of Transportation (Non-Medical): No  Physical Activity: Not on file  Stress: Not on file  Social Connections: Not on file    Outpatient Medications Prior to Visit  Medication Sig Dispense Refill   acetaminophen (TYLENOL) 500 MG tablet Take 500-1,000 mg by mouth every 6 (six) hours as needed for moderate pain.     atorvastatin (LIPITOR) 20 MG tablet TAKE 1 TABLET BY MOUTH IN THE  EVENING 100 tablet 2   carvedilol (COREG) 25 MG tablet Take 1 tablet (25 mg total) by mouth 2 (two) times daily with a meal. 180 tablet 3   cetirizine (ZYRTEC) 10 MG tablet Take 10 mg by mouth daily as needed for allergies.     Cholecalciferol (VITAMIN D) 50 MCG (2000 UT) tablet Take 6,000 Units by mouth daily.     colchicine 0.6 MG tablet Take 1 tablet (0.6 mg total) by mouth daily. 60 tablet 0   conjugated estrogens (PREMARIN) vaginal cream Place vaginally as directed. Use 2-3 times weekly  prnPlace 1 Applicatorful vaginally as directed. Use 2-3 times weekly  prn (Patient taking differently: Place vaginally as directed. 1 Applicatorful vaginally as directed. Use 2-3 times weekly  prn) 42 g 4   Cyanocobalamin (B-12) 3000 MCG CAPS  Take 3,000 mcg by mouth daily.     ferrous sulfate 325 (65 FE) MG tablet Take 1 tablet (325 mg total) by mouth every other day. (Patient taking differently: Take 325 mg by mouth daily.) 15 tablet 11   fluticasone (FLONASE) 50 MCG/ACT nasal spray Place 1 spray into both nostrils daily as needed for allergies or rhinitis.     gabapentin (NEURONTIN) 100 MG capsule TAKE 1 TO 3 CAPSULES BY MOUTH AT BEDTIME AS DIRECTED (Patient taking differently: Take 100-200 mg by mouth daily as needed (pain).) 300 capsule 2   latanoprost (XALATAN) 0.005 % ophthalmic solution Place 1 drop into both eyes at bedtime.     levothyroxine (SYNTHROID) 75 MCG tablet TAKE 1 TABLET BY MOUTH DAILY 100 tablet 2   magnesium oxide (MAG-OX) 400 (240 Mg) MG tablet Take 800 mg by mouth daily.     metFORMIN (GLUCOPHAGE-XR) 500 MG 24 hr tablet TAKE 3 TABLETS BY MOUTH DAILY 300 tablet 2   OVER THE COUNTER MEDICATION Take 1 tablet by mouth daily. Total Beets supplements     pantoprazole (PROTONIX) 40 MG tablet Take 1 tablet (40 mg total) by mouth 2 (two) times daily before a meal. 180 tablet 2   Turmeric 500 MG CAPS Take 1,000 mg by mouth daily.     valsartan-hydrochlorothiazide (DIOVAN-HCT) 320-25 MG tablet TAKE 1 TABLET BY MOUTH DAILY 100 tablet 2   Facility-Administered Medications Prior to Visit  Medication Dose Route Frequency Provider Last Rate Last Admin   betamethasone acetate-betamethasone sodium phosphate (CELESTONE) injection 3 mg  3 mg Intramuscular Once Gala Lewandowsky M, DPM       betamethasone acetate-betamethasone sodium phosphate (CELESTONE) injection 3 mg  3 mg Intramuscular Once Evans, Brent M, DPM         EXAM:  BP (!) 150/100 (BP Location: Left Arm, Patient Position: Sitting, Cuff Size: Normal)   Pulse (!) 55   Temp 97.8 F (36.6 C) (Oral)   Ht 5' 1.5" (1.562 m)   Wt 147 lb (66.7 kg)   SpO2 96%   BMI 27.33 kg/m   Body mass index is 27.33 kg/m.  GENERAL: vitals reviewed and listed above, alert, oriented,  appears well hydrated and in no acute distress HEENT: atraumatic, conjunctiva  clear, no obvious abnormalities on inspection of external nose and ears  NECK: no obvious masses on inspection palpation  LUNGS: clear to auscultation bilaterally, no wheezes, rales or rhonchi, good air movement CV: HRRR, no clubbing cyanosis or  peripheral edema nl cap refill   2/6 systolic m r and left upper border   no change with valsalva  MS: moves all extremities without noticeable focal  abnormality PSYCH: pleasant and cooperative, no obvious depression or anxiety Lab Results  Component Value Date   WBC 8.6 02/23/2023   HGB 12.4 02/23/2023   HCT 37.6 02/23/2023   PLT 296.0 02/23/2023   GLUCOSE 96 03/30/2023   CHOL 143 02/23/2023   TRIG 112.0 02/23/2023   HDL 40.00 02/23/2023   LDLDIRECT 101.0 03/04/2019   LDLCALC 80 02/23/2023   ALT 21 02/23/2023   AST 24 02/23/2023   NA 139 03/30/2023   K 4.0 03/30/2023   CL 101 03/30/2023   CREATININE 1.19 03/30/2023   BUN 22 03/30/2023   CO2 31 03/30/2023   TSH 2.20 02/23/2023   INR 1.12 11/19/2009   HGBA1C 6.3 (A) 06/22/2023   MICROALBUR 11.1 (H) 03/30/2023   BP Readings from Last 3 Encounters:  06/22/23 (!) 150/100  05/26/23 (!) 209/75  05/11/23 (!) 176/82  15 mm benign adrenal; nodule   pac pra   Consider hyper cortisol  eval dheas and dst ASSESSMENT AND PLAN:  Discussed the following assessment and plan:  Essential hypertension - Plan: Aldosterone + renin activity w/ ratio, DHEA-sulfate, Cortisol-am, blood, dexamethasone (DECADRON) 1 MG tablet, valsartan (DIOVAN) 320 MG tablet, hydrALAZINE (APRESOLINE) 10 MG tablet, ECHOCARDIOGRAM COMPLETE, Basic metabolic panel  Medication management - Plan: Aldosterone + renin activity w/ ratio, DHEA-sulfate, Cortisol-am, blood, dexamethasone (DECADRON) 1 MG tablet, valsartan (DIOVAN) 320 MG tablet, hydrALAZINE (APRESOLINE) 10 MG tablet, Basic metabolic panel  Type 2 diabetes mellitus with hyperglycemia,  without long-term current use of insulin (HCC) - Plan: Aldosterone + renin activity w/ ratio, DHEA-sulfate, Cortisol-am, blood, dexamethasone (DECADRON) 1 MG tablet, POC HgB A1c, valsartan (DIOVAN) 320 MG tablet, hydrALAZINE (APRESOLINE) 10 MG tablet, Basic metabolic panel  Stage 3b chronic kidney disease (HCC) - Plan: Aldosterone + renin activity w/ ratio, DHEA-sulfate, Cortisol-am, blood, dexamethasone (DECADRON) 1 MG tablet, valsartan (DIOVAN) 320 MG tablet, hydrALAZINE (APRESOLINE) 10 MG tablet  Influenza vaccine needed - Plan: Flu Vaccine Trivalent High Dose (Fluad)  Murmur - systoloc - Plan: ECHOCARDIOGRAM COMPLETE  Seems resistant    Consider  further eval  and poss spironolactone clonidine or hydralazine  Stop the hydrochlorothiazide   and add low dose hydralazine tid  10   Plan fu lab when off predn   check DMST if possible.  Aldo and pra   Check echo for murmur flow    See  instructions . -Patient advised to return or notify health care team  if  new concerns arise.  Patient Instructions  Bp improved but not optimum control. Stay on the carvedilol 25 twice a day  Stop the valsartan hydrochlorothiazide  and change to plain valsartan 320 mg per day. Add hydralazine 10 mg 3x per day  .   Finish the prednisone.   Then take one night and take the  one pill of dexamethasone as late as possible  the night before lab  Get lab around 8 am the next morning as possible   (will include adrenal hormone levels. )  I heard  a mild mumur on your exam today that may not be significant  but  will order a  echo test of the heart to make sure  no valve issues.     Neta Mends. Sawyer Mentzer M.D.

## 2023-06-22 ENCOUNTER — Ambulatory Visit (INDEPENDENT_AMBULATORY_CARE_PROVIDER_SITE_OTHER): Payer: Medicare Other | Admitting: Internal Medicine

## 2023-06-22 VITALS — BP 150/100 | HR 55 | Temp 97.8°F | Ht 61.5 in | Wt 147.0 lb

## 2023-06-22 DIAGNOSIS — N1832 Chronic kidney disease, stage 3b: Secondary | ICD-10-CM

## 2023-06-22 DIAGNOSIS — Z23 Encounter for immunization: Secondary | ICD-10-CM

## 2023-06-22 DIAGNOSIS — E1165 Type 2 diabetes mellitus with hyperglycemia: Secondary | ICD-10-CM

## 2023-06-22 DIAGNOSIS — R011 Cardiac murmur, unspecified: Secondary | ICD-10-CM

## 2023-06-22 DIAGNOSIS — I10 Essential (primary) hypertension: Secondary | ICD-10-CM

## 2023-06-22 DIAGNOSIS — Z79899 Other long term (current) drug therapy: Secondary | ICD-10-CM | POA: Diagnosis not present

## 2023-06-22 LAB — POCT GLYCOSYLATED HEMOGLOBIN (HGB A1C): Hemoglobin A1C: 6.3 % — AB (ref 4.0–5.6)

## 2023-06-22 MED ORDER — VALSARTAN 320 MG PO TABS: 320.0000 mg | ORAL_TABLET | Freq: Every day | ORAL | 3 refills | Status: DC

## 2023-06-22 MED ORDER — HYDRALAZINE HCL 10 MG PO TABS: 10.0000 mg | ORAL_TABLET | Freq: Three times a day (TID) | ORAL | 1 refills | Status: DC

## 2023-06-22 MED ORDER — DEXAMETHASONE 1 MG PO TABS: ORAL_TABLET | ORAL | 0 refills | Status: DC

## 2023-06-22 NOTE — Patient Instructions (Addendum)
Bp improved but not optimum control. Stay on the carvedilol 25 twice a day  Stop the valsartan hydrochlorothiazide  and change to plain valsartan 320 mg per day. Add hydralazine 10 mg 3x per day  .   Finish the prednisone.   Then take one night and take the  one pill of dexamethasone as late as possible  the night before lab  Get lab around 8 am the next morning as possible   (will include adrenal hormone levels. )  I heard  a mild mumur on your exam today that may not be significant  but  will order a  echo test of the heart to make sure  no valve issues.

## 2023-06-25 ENCOUNTER — Encounter: Payer: Self-pay | Admitting: Internal Medicine

## 2023-07-18 DIAGNOSIS — H2513 Age-related nuclear cataract, bilateral: Secondary | ICD-10-CM | POA: Diagnosis not present

## 2023-07-18 DIAGNOSIS — H401131 Primary open-angle glaucoma, bilateral, mild stage: Secondary | ICD-10-CM | POA: Diagnosis not present

## 2023-07-18 DIAGNOSIS — E119 Type 2 diabetes mellitus without complications: Secondary | ICD-10-CM | POA: Diagnosis not present

## 2023-07-18 LAB — HM DIABETES EYE EXAM

## 2023-07-19 ENCOUNTER — Ambulatory Visit (HOSPITAL_COMMUNITY): Payer: Medicare Other | Attending: Internal Medicine

## 2023-07-19 DIAGNOSIS — I10 Essential (primary) hypertension: Secondary | ICD-10-CM

## 2023-07-19 DIAGNOSIS — R011 Cardiac murmur, unspecified: Secondary | ICD-10-CM

## 2023-07-19 LAB — ECHOCARDIOGRAM COMPLETE: S' Lateral: 2.39 cm

## 2023-07-20 ENCOUNTER — Other Ambulatory Visit (INDEPENDENT_AMBULATORY_CARE_PROVIDER_SITE_OTHER): Payer: Medicare Other

## 2023-07-20 DIAGNOSIS — N1832 Chronic kidney disease, stage 3b: Secondary | ICD-10-CM | POA: Diagnosis not present

## 2023-07-20 DIAGNOSIS — I10 Essential (primary) hypertension: Secondary | ICD-10-CM | POA: Diagnosis not present

## 2023-07-20 DIAGNOSIS — Z79899 Other long term (current) drug therapy: Secondary | ICD-10-CM | POA: Diagnosis not present

## 2023-07-20 DIAGNOSIS — E1165 Type 2 diabetes mellitus with hyperglycemia: Secondary | ICD-10-CM

## 2023-07-20 LAB — BASIC METABOLIC PANEL
BUN: 23 mg/dL (ref 6–23)
CO2: 29 meq/L (ref 19–32)
Calcium: 10.4 mg/dL (ref 8.4–10.5)
Chloride: 106 meq/L (ref 96–112)
Creatinine, Ser: 1.28 mg/dL — ABNORMAL HIGH (ref 0.40–1.20)
GFR: 39.87 mL/min — ABNORMAL LOW (ref >60.00)
Glucose, Bld: 137 mg/dL — ABNORMAL HIGH (ref 70–99)
Potassium: 4.4 meq/L (ref 3.5–5.1)
Sodium: 143 meq/L (ref 135–145)

## 2023-07-24 DIAGNOSIS — M9904 Segmental and somatic dysfunction of sacral region: Secondary | ICD-10-CM | POA: Diagnosis not present

## 2023-07-24 DIAGNOSIS — M9905 Segmental and somatic dysfunction of pelvic region: Secondary | ICD-10-CM | POA: Diagnosis not present

## 2023-07-24 DIAGNOSIS — M9903 Segmental and somatic dysfunction of lumbar region: Secondary | ICD-10-CM | POA: Diagnosis not present

## 2023-07-24 DIAGNOSIS — M51361 Other intervertebral disc degeneration, lumbar region with lower extremity pain only: Secondary | ICD-10-CM | POA: Diagnosis not present

## 2023-07-31 NOTE — Progress Notes (Signed)
Echo test  shows normal pumping  left ventricle muscle.   There is moderate leaking   mitral valve . ( Prob cause of the murmur I heard)  That appears to be not causing you a problem  at this time  . But should be followed  and I will plan having you see cardiologist  to follow . can discuss at your upcoming appt .

## 2023-08-01 DIAGNOSIS — M51361 Other intervertebral disc degeneration, lumbar region with lower extremity pain only: Secondary | ICD-10-CM | POA: Diagnosis not present

## 2023-08-01 DIAGNOSIS — M9904 Segmental and somatic dysfunction of sacral region: Secondary | ICD-10-CM | POA: Diagnosis not present

## 2023-08-01 DIAGNOSIS — M9905 Segmental and somatic dysfunction of pelvic region: Secondary | ICD-10-CM | POA: Diagnosis not present

## 2023-08-01 DIAGNOSIS — M9903 Segmental and somatic dysfunction of lumbar region: Secondary | ICD-10-CM | POA: Diagnosis not present

## 2023-08-03 ENCOUNTER — Telehealth: Payer: Self-pay | Admitting: Internal Medicine

## 2023-08-03 LAB — ALDOSTERONE + RENIN ACTIVITY W/ RATIO
ALDO / PRA Ratio: 20 {ratio} (ref 0.9–28.9)
Aldosterone: 2 ng/dL
Renin Activity: 0.1 ng/mL/h — ABNORMAL LOW (ref 0.25–5.82)

## 2023-08-03 LAB — DHEA-SULFATE: DHEA-SO4: 34 ug/dL (ref 4–157)

## 2023-08-03 LAB — CORTISOL-AM, BLOOD: Cortisol - AM: 2.3 ug/dL — ABNORMAL LOW

## 2023-08-03 NOTE — Telephone Encounter (Signed)
Pt called, returning CMA's call. CMA was with a patient. Pt asked that CMA call back at her earliest convenience. 

## 2023-08-07 DIAGNOSIS — M51361 Other intervertebral disc degeneration, lumbar region with lower extremity pain only: Secondary | ICD-10-CM | POA: Diagnosis not present

## 2023-08-07 DIAGNOSIS — M9904 Segmental and somatic dysfunction of sacral region: Secondary | ICD-10-CM | POA: Diagnosis not present

## 2023-08-07 DIAGNOSIS — M9903 Segmental and somatic dysfunction of lumbar region: Secondary | ICD-10-CM | POA: Diagnosis not present

## 2023-08-07 DIAGNOSIS — M9905 Segmental and somatic dysfunction of pelvic region: Secondary | ICD-10-CM | POA: Diagnosis not present

## 2023-08-09 DIAGNOSIS — M9904 Segmental and somatic dysfunction of sacral region: Secondary | ICD-10-CM | POA: Diagnosis not present

## 2023-08-09 DIAGNOSIS — M9905 Segmental and somatic dysfunction of pelvic region: Secondary | ICD-10-CM | POA: Diagnosis not present

## 2023-08-09 DIAGNOSIS — M51361 Other intervertebral disc degeneration, lumbar region with lower extremity pain only: Secondary | ICD-10-CM | POA: Diagnosis not present

## 2023-08-09 DIAGNOSIS — M9903 Segmental and somatic dysfunction of lumbar region: Secondary | ICD-10-CM | POA: Diagnosis not present

## 2023-08-09 NOTE — Progress Notes (Signed)
Chief Complaint  Patient presents with   Medical Management of Chronic Issues    Pt is here for 6 months f/u. Pt reports she did not take hydralazine as she does not have it and did not know she is suppose to take it. Pt is also following up on an echo reports.     HPI: Kerry Walters 77 y.o. come in for Chronic disease management  Ht hard to control  and lab to check for abnormal adrenal  output  Doing ok  bp can be up at home  never took or received the hydralazine wasn't sure about taking that unclear instructions . Had echo but no cp sob exercise intolerance/ Got a strange phone call about calcium  ? If referral  to call back    ROS: See pertinent positives and negatives per HPI. Still takes iron   ( hx of iron def ) Past Medical History:  Diagnosis Date   Allergy    Asthma due to environmental allergies    no inhaler   Diabetes mellitus without complication (HCC)    Diverticulosis of colon    GERD (gastroesophageal reflux disease)    History of esophageal dilatation    FOR STRICTURE IN 2005   History of hiatal hernia    History of hypercalcemia    SECONDARY TO HYPERPARATHYROID   History of hyperparathyroidism    PRIMARY--  S/P RIGHT PARATHYROIDECTOMY   History of kidney stones    History of recurrent UTIs    Hyperlipidemia    Hypertension    Hypothyroidism    Incomplete right bundle branch block    Iron deficiency anemia due to chronic blood loss 12/01/2021   Nephrolithiasis    RIGHT    Nocturia    PONV (postoperative nausea and vomiting)    Pre-diabetes    Sciatica of right side     Family History  Problem Relation Age of Onset   Cancer Father        brain   Cancer Son        liver   Diabetes Sister    Schizophrenia Son    Colon cancer Neg Hx    Colon polyps Neg Hx    Esophageal cancer Neg Hx    Stomach cancer Neg Hx    Pancreatic cancer Neg Hx    Rectal cancer Neg Hx     Social History   Socioeconomic History   Marital status: Married     Spouse name: Not on file   Number of children: Not on file   Years of education: Not on file   Highest education level: Not on file  Occupational History   Not on file  Tobacco Use   Smoking status: Never   Smokeless tobacco: Never  Vaping Use   Vaping status: Never Used  Substance and Sexual Activity   Alcohol use: No   Drug use: No   Sexual activity: Not on file  Other Topics Concern   Not on file  Social History Narrative   Married   hh of 2   4 dogs   G2 P2   Water exercise.   Retired    [employer]    great grandchild          Social Drivers of Corporate investment banker Strain: Low Risk  (03/24/2020)   Overall Financial Resource Strain (CARDIA)    Difficulty of Paying Living Expenses: Not hard at all  Food Insecurity: No Food Insecurity (02/08/2023)  Hunger Vital Sign    Worried About Running Out of Food in the Last Year: Never true    Ran Out of Food in the Last Year: Never true  Transportation Needs: No Transportation Needs (03/24/2020)   PRAPARE - Administrator, Civil Service (Medical): No    Lack of Transportation (Non-Medical): No  Physical Activity: Not on file  Stress: Not on file  Social Connections: Not on file    Outpatient Medications Prior to Visit  Medication Sig Dispense Refill   acetaminophen (TYLENOL) 500 MG tablet Take 500-1,000 mg by mouth every 6 (six) hours as needed for moderate pain.     atorvastatin (LIPITOR) 20 MG tablet TAKE 1 TABLET BY MOUTH IN THE  EVENING 100 tablet 2   carvedilol (COREG) 25 MG tablet Take 1 tablet (25 mg total) by mouth 2 (two) times daily with a meal. 180 tablet 3   cetirizine (ZYRTEC) 10 MG tablet Take 10 mg by mouth daily as needed for allergies.     Cholecalciferol (VITAMIN D) 50 MCG (2000 UT) tablet Take 6,000 Units by mouth daily.     colchicine 0.6 MG tablet Take 1 tablet (0.6 mg total) by mouth daily. 60 tablet 0   conjugated estrogens (PREMARIN) vaginal cream Place vaginally as directed. Use 2-3  times weekly  prnPlace 1 Applicatorful vaginally as directed. Use 2-3 times weekly  prn (Patient taking differently: Place vaginally as directed. 1 Applicatorful vaginally as directed. Use 2-3 times weekly  prn) 42 g 4   Cyanocobalamin (B-12) 3000 MCG CAPS Take 3,000 mcg by mouth daily.     ferrous sulfate 325 (65 FE) MG tablet Take 1 tablet (325 mg total) by mouth every other day. (Patient taking differently: Take 325 mg by mouth daily.) 15 tablet 11   fluticasone (FLONASE) 50 MCG/ACT nasal spray Place 1 spray into both nostrils daily as needed for allergies or rhinitis.     gabapentin (NEURONTIN) 100 MG capsule TAKE 1 TO 3 CAPSULES BY MOUTH AT BEDTIME AS DIRECTED (Patient taking differently: Take 100-200 mg by mouth daily as needed (pain).) 300 capsule 2   latanoprost (XALATAN) 0.005 % ophthalmic solution Place 1 drop into both eyes at bedtime.     magnesium oxide (MAG-OX) 400 (240 Mg) MG tablet Take 800 mg by mouth daily.     metFORMIN (GLUCOPHAGE-XR) 500 MG 24 hr tablet TAKE 3 TABLETS BY MOUTH DAILY 300 tablet 2   OVER THE COUNTER MEDICATION Take 1 tablet by mouth daily. Total Beets supplements     pantoprazole (PROTONIX) 40 MG tablet Take 1 tablet (40 mg total) by mouth 2 (two) times daily before a meal. 180 tablet 2   Turmeric 500 MG CAPS Take 1,000 mg by mouth daily.     valsartan (DIOVAN) 320 MG tablet Take 1 tablet (320 mg total) by mouth daily. 90 tablet 3   dexamethasone (DECADRON) 1 MG tablet Take 1 mg between 11 pm and midnight and get next morning 8 am lab for cortisol and dexamethasone levels (Patient not taking: Reported on 08/10/2023) 2 tablet 0   hydrALAZINE (APRESOLINE) 10 MG tablet Take 1 tablet (10 mg total) by mouth 3 (three) times daily. (Patient not taking: Reported on 08/10/2023) 90 tablet 1   levothyroxine (SYNTHROID) 75 MCG tablet TAKE 1 TABLET BY MOUTH DAILY (Patient not taking: Reported on 08/10/2023) 100 tablet 2   Facility-Administered Medications Prior to Visit   Medication Dose Route Frequency Provider Last Rate Last Admin   betamethasone  acetate-betamethasone sodium phosphate (CELESTONE) injection 3 mg  3 mg Intramuscular Once Gala Lewandowsky M, DPM       betamethasone acetate-betamethasone sodium phosphate (CELESTONE) injection 3 mg  3 mg Intramuscular Once Evans, Brent M, DPM         EXAM:  BP (!) 148/68 (BP Location: Left Arm, Patient Position: Sitting, Cuff Size: Large)   Pulse (!) 54   Temp 97.6 F (36.4 C) (Oral)   Ht 5' 1.5" (1.562 m)   Wt 149 lb 3.2 oz (67.7 kg)   SpO2 98%   BMI 27.73 kg/m   Body mass index is 27.73 kg/m.  GENERAL: vitals reviewed and listed above, alert, oriented, appears well hydrated and in no acute distress HEENT: atraumatic, conjunctiva  clear, no obvious abnormalities on inspection of external nose and ears  NECK: no obvious masses on inspection palpation  LUNGS: clear to auscultation bilaterally, no wheezes, rales or rhonchi, good air movement CV: HRRR, no clubbing cyanosis or  peripheral edema nl cap refill  short 2/6 syst m lsb  MS: moves all extremities without noticeable focal  abnormality PSYCH: pleasant and cooperative, no obvious depression or anxiety Lab Results  Component Value Date   WBC 8.6 02/23/2023   HGB 12.4 02/23/2023   HCT 37.6 02/23/2023   PLT 296.0 02/23/2023   GLUCOSE 137 (H) 07/20/2023   CHOL 143 02/23/2023   TRIG 112.0 02/23/2023   HDL 40.00 02/23/2023   LDLDIRECT 101.0 03/04/2019   LDLCALC 80 02/23/2023   ALT 21 02/23/2023   AST 24 02/23/2023   NA 143 07/20/2023   K 4.4 07/20/2023   CL 106 07/20/2023   CREATININE 1.28 (H) 07/20/2023   BUN 23 07/20/2023   CO2 29 07/20/2023   TSH 2.20 02/23/2023   INR 1.12 11/19/2009   HGBA1C 6.3 (A) 06/22/2023   MICROALBUR 11.1 (H) 03/30/2023   BP Readings from Last 3 Encounters:  08/10/23 (!) 148/68  06/22/23 (!) 150/100  05/26/23 (!) 209/75  Dexa suppression  show low am cortisol   Last imagining  jan 2024 Adrenals/Urinary  Tract: Stable 15 mm right adrenal gland nodule which measures 8 Hounsfield units and is consistent with a benign adrenal gland adenoma. No further imaging evaluation or follow-up is necessary.   Renal cortical scarring type changes involving the right kidney. No renal calculi or worrisome renal lesions without contrast.   ASSESSMENT AND PLAN:  Discussed the following assessment and plan:  Mitral valve insufficiency, unspecified etiology - on echo and exam  mild to mod nl lv function denies sx  incidental finding on exam - Plan: Ambulatory referral to Cardiology  Essential hypertension - ? improved in office  neg hypercortisolism screen - Plan: Ambulatory referral to Cardiology  Medication management  Hypercalcemia - hx of hpth surgery but mild continue  and hart to control HT  Stage 3b chronic kidney disease (HCC) - neg urine protein screen  Adrenal nodule (HCC)  Gastroesophageal reflux disease, unspecified whether esophagitis present  Type 2 diabetes mellitus with hyperglycemia, without long-term current use of insulin (HCC) Multiple issues  but feels fine. I think the referral back t endocrine about the  hypercalcemia  was the weight phone call and should follow through   . She had surgery for HPTH  in past but continued mild  elevation   uncertain if related to  hard to control BP she does not use my chart .  Plan non urgent consult with cardiology  and optimize following the  MR finding .  And also any advice  with  BP control .  She did not take the hydralazine and reported  tid dosing would be difficult. Instead will add clonidine low dose 0.1 bid and fu in about 2 months or as needed  Dm has ben in control    -Patient advised to return or notify health care team  if  new concerns arise.  Patient Instructions  Good to see  you today .  Dont take the hydralzine that you didn't get. Add clonidine  twice a day  Low dose   Labs were ok for adrenal over drive.   Will  plan non urgent cardiology consults  to follow the mitral valve leaking ( not severe) .  I think the  call about calcium was a re look  relook for the hyperparathyroidism.   Neta Mends. Raylie Maddison M.D.

## 2023-08-10 ENCOUNTER — Ambulatory Visit (INDEPENDENT_AMBULATORY_CARE_PROVIDER_SITE_OTHER): Payer: Medicare Other | Admitting: Internal Medicine

## 2023-08-10 VITALS — BP 148/68 | HR 54 | Temp 97.6°F | Ht 61.5 in | Wt 149.2 lb

## 2023-08-10 DIAGNOSIS — E279 Disorder of adrenal gland, unspecified: Secondary | ICD-10-CM

## 2023-08-10 DIAGNOSIS — N1832 Chronic kidney disease, stage 3b: Secondary | ICD-10-CM | POA: Diagnosis not present

## 2023-08-10 DIAGNOSIS — I340 Nonrheumatic mitral (valve) insufficiency: Secondary | ICD-10-CM | POA: Diagnosis not present

## 2023-08-10 DIAGNOSIS — K219 Gastro-esophageal reflux disease without esophagitis: Secondary | ICD-10-CM

## 2023-08-10 DIAGNOSIS — I10 Essential (primary) hypertension: Secondary | ICD-10-CM | POA: Diagnosis not present

## 2023-08-10 DIAGNOSIS — E1165 Type 2 diabetes mellitus with hyperglycemia: Secondary | ICD-10-CM

## 2023-08-10 DIAGNOSIS — Z79899 Other long term (current) drug therapy: Secondary | ICD-10-CM | POA: Diagnosis not present

## 2023-08-10 DIAGNOSIS — R011 Cardiac murmur, unspecified: Secondary | ICD-10-CM

## 2023-08-10 MED ORDER — CLONIDINE HCL 0.1 MG PO TABS: 0.1000 mg | ORAL_TABLET | Freq: Two times a day (BID) | ORAL | 1 refills | Status: DC

## 2023-08-10 NOTE — Patient Instructions (Addendum)
Good to see  you today .  Dont take the hydralzine that you didn't get. Add clonidine  twice a day  Low dose   Labs were ok for adrenal over drive.   Will plan non urgent cardiology consults  to follow the mitral valve leaking ( not severe) .  I think the  call about calcium was a re look  relook for the hyperparathyroidism.

## 2023-08-11 ENCOUNTER — Encounter: Payer: Self-pay | Admitting: Internal Medicine

## 2023-08-11 DIAGNOSIS — E1165 Type 2 diabetes mellitus with hyperglycemia: Secondary | ICD-10-CM | POA: Insufficient documentation

## 2023-08-11 DIAGNOSIS — E279 Disorder of adrenal gland, unspecified: Secondary | ICD-10-CM | POA: Insufficient documentation

## 2023-08-13 NOTE — Progress Notes (Signed)
Discussed at OV appt.

## 2023-08-14 DIAGNOSIS — M9903 Segmental and somatic dysfunction of lumbar region: Secondary | ICD-10-CM | POA: Diagnosis not present

## 2023-08-14 DIAGNOSIS — M51361 Other intervertebral disc degeneration, lumbar region with lower extremity pain only: Secondary | ICD-10-CM | POA: Diagnosis not present

## 2023-08-14 DIAGNOSIS — M9904 Segmental and somatic dysfunction of sacral region: Secondary | ICD-10-CM | POA: Diagnosis not present

## 2023-08-14 DIAGNOSIS — M9905 Segmental and somatic dysfunction of pelvic region: Secondary | ICD-10-CM | POA: Diagnosis not present

## 2023-08-16 ENCOUNTER — Encounter: Payer: Self-pay | Admitting: Internal Medicine

## 2023-08-16 DIAGNOSIS — Z1231 Encounter for screening mammogram for malignant neoplasm of breast: Secondary | ICD-10-CM | POA: Diagnosis not present

## 2023-08-16 LAB — HM MAMMOGRAPHY

## 2023-08-28 DIAGNOSIS — M51361 Other intervertebral disc degeneration, lumbar region with lower extremity pain only: Secondary | ICD-10-CM | POA: Diagnosis not present

## 2023-08-28 DIAGNOSIS — M9905 Segmental and somatic dysfunction of pelvic region: Secondary | ICD-10-CM | POA: Diagnosis not present

## 2023-08-28 DIAGNOSIS — M9904 Segmental and somatic dysfunction of sacral region: Secondary | ICD-10-CM | POA: Diagnosis not present

## 2023-08-28 DIAGNOSIS — M9903 Segmental and somatic dysfunction of lumbar region: Secondary | ICD-10-CM | POA: Diagnosis not present

## 2023-08-31 ENCOUNTER — Telehealth: Payer: Self-pay

## 2023-08-31 NOTE — Telephone Encounter (Signed)
 Oh my   glad y ou stopped the medication( please place on her can't take allergy list) take off her med list    So may be we could try the hydralazine   after all  but I realizde usually best to take 3x per day  may be could help twice a day .  I would like our pharmacist  to contact you and see what can be worked out . May not be resolved until next week.  In interim  Than you Darice Shallow. For relating this message  Or new call center is a work in progress .

## 2023-08-31 NOTE — Telephone Encounter (Signed)
 Received message from Darice Shallow on mychart, stating Kerry Walters needs to let you know she is unable to take new medication. She tried it for three days and each time she took it she had lip swelling , very dry mouth causing swallowing problem. Etc. She is no longer taking it. She has been unable to get in touch with anyone to let them know she is not taking it and wants something different. She does not use my chart due to her computer's lack of protection. Please get in touch with her. Thank you! Darice Shallow.    Contacted pt.   Pt reports her lip was swelling, dry mouth that caused for her hard to talk and hard to swallowing after taking Clonidine  0.1mg  as it was prescribe twice a day. Pt also reports drowsiness, describes it as  hit like a piece of brick. She started taking the med on Thursday 12/26. Stopped on Sunday 12/29.   Pt reports after had stop taking the medication, the sx went away. Offer to schedule a visit to discuss with provider. Pt decline.   Pt would like to relay message to provider first and address.   Please advise.

## 2023-09-01 NOTE — Telephone Encounter (Signed)
 Spoke to pt. Inform her message from provider.   Pt verbalized understanding and states she is looking forward to speak with our clinical pharmacy.

## 2023-09-05 ENCOUNTER — Other Ambulatory Visit (INDEPENDENT_AMBULATORY_CARE_PROVIDER_SITE_OTHER): Payer: Medicare Other

## 2023-09-05 DIAGNOSIS — I10 Essential (primary) hypertension: Secondary | ICD-10-CM

## 2023-09-05 DIAGNOSIS — M9903 Segmental and somatic dysfunction of lumbar region: Secondary | ICD-10-CM | POA: Diagnosis not present

## 2023-09-05 DIAGNOSIS — M51361 Other intervertebral disc degeneration, lumbar region with lower extremity pain only: Secondary | ICD-10-CM | POA: Diagnosis not present

## 2023-09-05 DIAGNOSIS — M9905 Segmental and somatic dysfunction of pelvic region: Secondary | ICD-10-CM | POA: Diagnosis not present

## 2023-09-05 DIAGNOSIS — M9904 Segmental and somatic dysfunction of sacral region: Secondary | ICD-10-CM | POA: Diagnosis not present

## 2023-09-05 NOTE — Telephone Encounter (Signed)
 Spoke with patient and reviewed her medications with her. Patient does not believe she would be able to be adherent to a TID medication. Pt is open to giving Spironolactone  a try if you would like me to send in an rx for that at 25mg  daily?   Thank you, Jon VEAR Lindau, PharmD Clinical Pharmacist [phone]

## 2023-09-05 NOTE — Progress Notes (Signed)
 09/05/2023 Name: Kerry Walters MRN: [id number] DOB: 1946/03/14  Chief Complaint  Patient presents with   Hypertension   Medication Management    Kerry Walters is a 78 y.o. year old female who presented for a telephone visit.   They were referred to the pharmacist by their PCP for assistance in managing hypertension and complex medication management.    Subjective:  Care Team: Primary Care Provider: Charlett Apolinar POUR, MD ; Next Scheduled Visit: 10/11/23  Medication Access/Adherence  Current Pharmacy:  [facility]) - [employer], [place] - [address] [place] [address] [place] [address] Phone: [phone] Fax: [phone]  [facility] - [place], [place] - [address] [place] [address] Phone: [phone] Fax: [phone]  [id number] [place], [place] - [address] [facility] [address] [facility] [address] [place] [address] Phone: [phone] Fax: [phone]   Patient reports affordability concerns with their medications: No  Patient reports access/transportation concerns to their pharmacy: No  Patient reports adherence concerns with their medications:  No  (but fill dates suggest otherwise, states excess on hand)   Hypertension:  Current medications: Carvedilol  25mg  BID, Valsartan  320mg  Medications previously tried: Amlodipine  (swelling), Clonidine  (swelling in mouth), Triamterene /hydrochlorothiazide , Losartan   Patient has a validated, automated, upper arm home BP cuff Current blood pressure readings readings: Not checking routinely but reports anywhere from 140s-160s for SBP always  Patient denies hypotensive s/sx including dizziness, lightheadedness.  Patient denies hypertensive symptoms including headache, chest pain, shortness of breath    Objective:  Lab Results  Component Value Date   HGBA1C 6.3 (A) 06/22/2023    Lab  Results  Component Value Date   CREATININE 1.28 (H) 07/20/2023   BUN 23 07/20/2023   NA 143 07/20/2023   K 4.4 07/20/2023   CL 106 07/20/2023   CO2 29 07/20/2023    Lab Results  Component Value Date   CHOL 143 02/23/2023   HDL 40.00 02/23/2023   LDLCALC 80 02/23/2023   LDLDIRECT 101.0 03/04/2019   TRIG 112.0 02/23/2023   CHOLHDL 4 02/23/2023    Medications Reviewed Today     Reviewed by [name], RPH (Pharmacist) on 09/05/23 at 1001  Med List Status: <None>   Medication Order Taking? Sig Documenting Provider Last Dose Status Informant  acetaminophen  (TYLENOL ) 500 MG tablet [id number] Yes Take 500-1,000 mg by mouth every 6 (six) hours as needed for moderate pain. [provider] Taking Active Self  atorvastatin  (LIPITOR) 20 MG tablet [id number] Yes TAKE 1 TABLET BY MOUTH IN THE  EVENING Panosh, Wanda K, MD Taking Active   betamethasone  acetate-betamethasone  sodium phosphate  (CELESTONE ) injection 3 mg [id number]   Janit Thresa HERO, DPM  Active   betamethasone  acetate-betamethasone  sodium phosphate  (CELESTONE ) injection 3 mg [id number]   Janit Thresa HERO, DPM  Active   carvedilol  (COREG ) 25 MG tablet [id number] Yes Take 1 tablet (25 mg total) by mouth 2 (two) times daily with a meal. Douglass Caul B, FNP Taking Active   cetirizine (ZYRTEC) 10 MG tablet [id number] Yes Take 10 mg by mouth daily as needed for allergies. [provider] Taking Active Self  Cholecalciferol (VITAMIN D) 50 MCG (2000 UT) tablet [id number] Yes Take 6,000 Units by mouth daily. [provider] Taking Active Self  conjugated estrogens  (PREMARIN ) vaginal cream [id number] Yes Place vaginally as directed. Use 2-3 times weekly  prnPlace 1 Applicatorful  vaginally as directed. Use 2-3 times weekly  prn  Patient taking differently: Place vaginally as directed. 1 Applicatorful vaginally as directed. Use 2-3 times weekly  prn   Panosh, Wanda K, MD Taking Active Self  Cyanocobalamin  (B-12) 3000 MCG  CAPS [id number] Yes Take 3,000 mcg by mouth daily. [provider] Taking Active Self  ferrous sulfate  325 (65 FE) MG tablet [id number] Yes Take 1 tablet (325 mg total) by mouth every other day.  Patient taking differently: Take 325 mg by mouth daily.   Eda Iha, MD Taking Active Self  fluticasone [facility]) 50 MCG/ACT nasal spray [id number] Yes Place 1 spray into both nostrils daily as needed for allergies or rhinitis. [provider] Taking Active Self  gabapentin  (NEURONTIN ) 100 MG capsule [id number] Yes TAKE 1 TO 3 CAPSULES BY MOUTH AT BEDTIME AS DIRECTED  Patient taking differently: Take 100-200 mg by mouth daily as needed (pain).   Panosh, Apolinar POUR, MD Taking Active Self  latanoprost (XALATAN) 0.005 % ophthalmic solution [id number] Yes Place 1 drop into both eyes at bedtime. [provider] Taking Active Self  levothyroxine  (SYNTHROID ) 75 MCG tablet [id number] Yes TAKE 1 TABLET BY MOUTH DAILY Webb, Padonda B, FNP Taking Active   magnesium oxide (MAG-OX) 400 (240 Mg) MG tablet [id number] Yes Take 800 mg by mouth daily. [provider] Taking Active Self  metFORMIN  (GLUCOPHAGE -XR) 500 MG 24 hr tablet [id number] Yes TAKE 3 TABLETS BY MOUTH DAILY Panosh, Wanda K, MD Taking Active   OVER THE COUNTER MEDICATION [id number] Yes Take 1 tablet by mouth daily. Total Beets supplements [provider] Taking Active Self  pantoprazole  (PROTONIX ) 40 MG tablet [id number] Yes Take 1 tablet (40 mg total) by mouth 2 (two) times daily before a meal. Mansouraty, Aloha Raddle., MD Taking Active            Med Note JANEAN, [facility]   Tue Sep 05, 2023 10:01 AM) One tab once daily  Turmeric 500 MG CAPS [id number] Yes Take 1,000 mg by mouth daily. [provider] Taking Active Self  valsartan  (DIOVAN ) 320 MG tablet [id number] Yes Take 1 tablet (320 mg total) by mouth daily. Panosh, Apolinar POUR, MD Taking Active               Assessment/Plan:   Hypertension: -  Currently uncontrolled - Reviewed long term cardiovascular and renal outcomes of uncontrolled blood pressure - Reviewed appropriate blood pressure monitoring technique and reviewed goal blood pressure. Recommended to check home blood pressure and heart rate daily preferably but minimum of 2-3x/week - Recommend to START Spironolactone  25mg  daily, BMP in 1-2 weeks with PCP approval    Follow Up Plan: 2 weeks  Jon VEAR Lindau, PharmD Clinical Pharmacist [phone]

## 2023-09-07 MED ORDER — SPIRONOLACTONE 25 MG PO TABS: 25.0000 mg | ORAL_TABLET | Freq: Every day | ORAL | 0 refills | Status: DC

## 2023-09-25 DIAGNOSIS — M51361 Other intervertebral disc degeneration, lumbar region with lower extremity pain only: Secondary | ICD-10-CM | POA: Diagnosis not present

## 2023-09-25 DIAGNOSIS — M9903 Segmental and somatic dysfunction of lumbar region: Secondary | ICD-10-CM | POA: Diagnosis not present

## 2023-09-25 DIAGNOSIS — M9904 Segmental and somatic dysfunction of sacral region: Secondary | ICD-10-CM | POA: Diagnosis not present

## 2023-09-25 DIAGNOSIS — M9905 Segmental and somatic dysfunction of pelvic region: Secondary | ICD-10-CM | POA: Diagnosis not present

## 2023-09-27 ENCOUNTER — Other Ambulatory Visit (INDEPENDENT_AMBULATORY_CARE_PROVIDER_SITE_OTHER): Payer: Medicare Other

## 2023-09-27 DIAGNOSIS — M51361 Other intervertebral disc degeneration, lumbar region with lower extremity pain only: Secondary | ICD-10-CM | POA: Diagnosis not present

## 2023-09-27 DIAGNOSIS — I10 Essential (primary) hypertension: Secondary | ICD-10-CM

## 2023-09-27 DIAGNOSIS — M9903 Segmental and somatic dysfunction of lumbar region: Secondary | ICD-10-CM | POA: Diagnosis not present

## 2023-09-27 DIAGNOSIS — M9905 Segmental and somatic dysfunction of pelvic region: Secondary | ICD-10-CM | POA: Diagnosis not present

## 2023-09-27 DIAGNOSIS — M9904 Segmental and somatic dysfunction of sacral region: Secondary | ICD-10-CM | POA: Diagnosis not present

## 2023-09-27 LAB — BASIC METABOLIC PANEL
BUN: 20 mg/dL (ref 6–23)
CO2: 30 meq/L (ref 19–32)
Calcium: 10.1 mg/dL (ref 8.4–10.5)
Chloride: 107 meq/L (ref 96–112)
Creatinine, Ser: 1.29 mg/dL — ABNORMAL HIGH (ref 0.40–1.20)
GFR: 39.45 mL/min — ABNORMAL LOW (ref >60.00)
Glucose, Bld: 113 mg/dL — ABNORMAL HIGH (ref 70–99)
Potassium: 4.1 meq/L (ref 3.5–5.1)
Sodium: 143 meq/L (ref 135–145)

## 2023-09-27 NOTE — Addendum Note (Signed)
Addended by: Vickii Chafe on: 09/27/2023 10:20 AM   Modules accepted: Orders

## 2023-10-02 NOTE — Progress Notes (Signed)
Potassium and kidney function  stable .  Has   fu visit  2 12

## 2023-10-03 DIAGNOSIS — M51361 Other intervertebral disc degeneration, lumbar region with lower extremity pain only: Secondary | ICD-10-CM | POA: Diagnosis not present

## 2023-10-03 DIAGNOSIS — M9904 Segmental and somatic dysfunction of sacral region: Secondary | ICD-10-CM | POA: Diagnosis not present

## 2023-10-03 DIAGNOSIS — M9905 Segmental and somatic dysfunction of pelvic region: Secondary | ICD-10-CM | POA: Diagnosis not present

## 2023-10-03 DIAGNOSIS — M9903 Segmental and somatic dysfunction of lumbar region: Secondary | ICD-10-CM | POA: Diagnosis not present

## 2023-10-10 NOTE — Progress Notes (Unsigned)
No chief complaint on file.   HPI: Kerry Walters 78 y.o. come in for Chronic disease management   ongoing  trials of BP control Dm:  ROS: See pertinent positives and negatives per HPI.  Past Medical History:  Diagnosis Date   Allergy    Asthma due to environmental allergies    no inhaler   Diabetes mellitus without complication (HCC)    Diverticulosis of colon    GERD (gastroesophageal reflux disease)    History of esophageal dilatation    FOR STRICTURE IN 2005   History of hiatal hernia    History of hypercalcemia    SECONDARY TO HYPERPARATHYROID   History of hyperparathyroidism    PRIMARY--  S/P RIGHT PARATHYROIDECTOMY   History of kidney stones    History of recurrent UTIs    Hyperlipidemia    Hypertension    Hypothyroidism    Incomplete right bundle branch block    Iron deficiency anemia due to chronic blood loss 12/01/2021   Nephrolithiasis    RIGHT    Nocturia    PONV (postoperative nausea and vomiting)    Pre-diabetes    Sciatica of right side     Family History  Problem Relation Age of Onset   Cancer Father        brain   Cancer Son        liver   Diabetes Sister    Schizophrenia Son    Colon cancer Neg Hx    Colon polyps Neg Hx    Esophageal cancer Neg Hx    Stomach cancer Neg Hx    Pancreatic cancer Neg Hx    Rectal cancer Neg Hx     Social History   Socioeconomic History   Marital status: Married    Spouse name: Not on file   Number of children: Not on file   Years of education: Not on file   Highest education level: Not on file  Occupational History   Not on file  Tobacco Use   Smoking status: Never   Smokeless tobacco: Never  Vaping Use   Vaping status: Never Used  Substance and Sexual Activity   Alcohol use: No   Drug use: No   Sexual activity: Not on file  Other Topics Concern   Not on file  Social History Narrative   Married   hh of 2   4 dogs   G2 P2   Water exercise.   Retired    [employer]    great grandchild           Social Drivers of Corporate investment banker Strain: Low Risk  (03/24/2020)   Overall Financial Resource Strain (CARDIA)    Difficulty of Paying Living Expenses: Not hard at all  Food Insecurity: No Food Insecurity (02/08/2023)   Hunger Vital Sign    Worried About Running Out of Food in the Last Year: Never true    Ran Out of Food in the Last Year: Never true  Transportation Needs: No Transportation Needs (03/24/2020)   PRAPARE - Administrator, Civil Service (Medical): No    Lack of Transportation (Non-Medical): No  Physical Activity: Not on file  Stress: Not on file  Social Connections: Not on file    Outpatient Medications Prior to Visit  Medication Sig Dispense Refill   acetaminophen (TYLENOL) 500 MG tablet Take 500-1,000 mg by mouth every 6 (six) hours as needed for moderate pain.     atorvastatin (LIPITOR) 20  MG tablet TAKE 1 TABLET BY MOUTH IN THE  EVENING 100 tablet 2   carvedilol (COREG) 25 MG tablet Take 1 tablet (25 mg total) by mouth 2 (two) times daily with a meal. 180 tablet 3   cetirizine (ZYRTEC) 10 MG tablet Take 10 mg by mouth daily as needed for allergies.     Cholecalciferol (VITAMIN D) 50 MCG (2000 UT) tablet Take 6,000 Units by mouth daily.     conjugated estrogens (PREMARIN) vaginal cream Place vaginally as directed. Use 2-3 times weekly  prnPlace 1 Applicatorful vaginally as directed. Use 2-3 times weekly  prn (Patient taking differently: Place vaginally as directed. 1 Applicatorful vaginally as directed. Use 2-3 times weekly  prn) 42 g 4   Cyanocobalamin (B-12) 3000 MCG CAPS Take 3,000 mcg by mouth daily.     ferrous sulfate 325 (65 FE) MG tablet Take 1 tablet (325 mg total) by mouth every other day. (Patient taking differently: Take 325 mg by mouth daily.) 15 tablet 11   fluticasone (FLONASE) 50 MCG/ACT nasal spray Place 1 spray into both nostrils daily as needed for allergies or rhinitis.     gabapentin (NEURONTIN) 100 MG capsule TAKE 1 TO 3  CAPSULES BY MOUTH AT BEDTIME AS DIRECTED (Patient taking differently: Take 100-200 mg by mouth daily as needed (pain).) 300 capsule 2   latanoprost (XALATAN) 0.005 % ophthalmic solution Place 1 drop into both eyes at bedtime.     levothyroxine (SYNTHROID) 75 MCG tablet TAKE 1 TABLET BY MOUTH DAILY 100 tablet 2   magnesium oxide (MAG-OX) 400 (240 Mg) MG tablet Take 800 mg by mouth daily.     metFORMIN (GLUCOPHAGE-XR) 500 MG 24 hr tablet TAKE 3 TABLETS BY MOUTH DAILY 300 tablet 2   OVER THE COUNTER MEDICATION Take 1 tablet by mouth daily. Total Beets supplements     pantoprazole (PROTONIX) 40 MG tablet Take 1 tablet (40 mg total) by mouth 2 (two) times daily before a meal. 180 tablet 2   spironolactone (ALDACTONE) 25 MG tablet Take 1 tablet (25 mg total) by mouth daily. 90 tablet 0   Turmeric 500 MG CAPS Take 1,000 mg by mouth daily.     valsartan (DIOVAN) 320 MG tablet Take 1 tablet (320 mg total) by mouth daily. 90 tablet 3   Facility-Administered Medications Prior to Visit  Medication Dose Route Frequency Provider Last Rate Last Admin   betamethasone acetate-betamethasone sodium phosphate (CELESTONE) injection 3 mg  3 mg Intramuscular Once Gala Lewandowsky M, DPM       betamethasone acetate-betamethasone sodium phosphate (CELESTONE) injection 3 mg  3 mg Intramuscular Once Felecia Shelling, DPM         EXAM:  There were no vitals taken for this visit.  There is no height or weight on file to calculate BMI. BP Readings from Last 3 Encounters:  08/10/23 (!) 148/68  06/22/23 (!) 150/100  05/26/23 (!) 209/75   Wt Readings from Last 3 Encounters:  08/10/23 149 lb 3.2 oz (67.7 kg)  06/22/23 147 lb (66.7 kg)  05/11/23 146 lb 6.4 oz (66.4 kg)     GENERAL: vitals reviewed and listed above, alert, oriented, appears well hydrated and in no acute distress HEENT: atraumatic, conjunctiva  clear, no obvious abnormalities on inspection of external nose and ears OP : no lesion edema or exudate  NECK:  no obvious masses on inspection palpation  LUNGS: clear to auscultation bilaterally, no wheezes, rales or rhonchi, good air movement CV: HRRR, no clubbing cyanosis  or  peripheral edema nl cap refill  MS: moves all extremities without noticeable focal  abnormality PSYCH: pleasant and cooperative, no obvious depression or anxiety Lab Results  Component Value Date   WBC 8.6 02/23/2023   HGB 12.4 02/23/2023   HCT 37.6 02/23/2023   PLT 296.0 02/23/2023   GLUCOSE 113 (H) 09/27/2023   CHOL 143 02/23/2023   TRIG 112.0 02/23/2023   HDL 40.00 02/23/2023   LDLDIRECT 101.0 03/04/2019   LDLCALC 80 02/23/2023   ALT 21 02/23/2023   AST 24 02/23/2023   NA 143 09/27/2023   K 4.1 09/27/2023   CL 107 09/27/2023   CREATININE 1.29 (H) 09/27/2023   BUN 20 09/27/2023   CO2 30 09/27/2023   TSH 2.20 02/23/2023   INR 1.12 11/19/2009   HGBA1C 6.3 (A) 06/22/2023   MICROALBUR 11.1 (H) 03/30/2023   BP Readings from Last 3 Encounters:  08/10/23 (!) 148/68  06/22/23 (!) 150/100  05/26/23 (!) 209/75    ASSESSMENT AND PLAN:  Discussed the following assessment and plan:  No diagnosis found.  -Patient advised to return or notify health care team  if  new concerns arise.  There are no Patient Instructions on file for this visit.   Neta Mends. Anayiah Howden M.D.

## 2023-10-11 ENCOUNTER — Ambulatory Visit: Payer: Medicare Other | Admitting: Internal Medicine

## 2023-10-11 ENCOUNTER — Encounter: Payer: Self-pay | Admitting: Internal Medicine

## 2023-10-11 ENCOUNTER — Other Ambulatory Visit: Payer: Self-pay | Admitting: Internal Medicine

## 2023-10-11 VITALS — BP 160/87 | HR 53 | Temp 97.9°F | Ht 61.5 in | Wt 148.2 lb

## 2023-10-11 DIAGNOSIS — R5383 Other fatigue: Secondary | ICD-10-CM

## 2023-10-11 DIAGNOSIS — E1165 Type 2 diabetes mellitus with hyperglycemia: Secondary | ICD-10-CM | POA: Diagnosis not present

## 2023-10-11 DIAGNOSIS — R809 Proteinuria, unspecified: Secondary | ICD-10-CM | POA: Diagnosis not present

## 2023-10-11 DIAGNOSIS — Z79899 Other long term (current) drug therapy: Secondary | ICD-10-CM | POA: Diagnosis not present

## 2023-10-11 DIAGNOSIS — N1832 Chronic kidney disease, stage 3b: Secondary | ICD-10-CM

## 2023-10-11 DIAGNOSIS — I340 Nonrheumatic mitral (valve) insufficiency: Secondary | ICD-10-CM | POA: Diagnosis not present

## 2023-10-11 DIAGNOSIS — E039 Hypothyroidism, unspecified: Secondary | ICD-10-CM

## 2023-10-11 DIAGNOSIS — I10 Essential (primary) hypertension: Secondary | ICD-10-CM | POA: Diagnosis not present

## 2023-10-11 LAB — POCT URINALYSIS DIPSTICK
Bilirubin, UA: NEGATIVE
Blood, UA: NEGATIVE
Glucose, UA: NEGATIVE
Ketones, UA: NEGATIVE
Leukocytes, UA: NEGATIVE
Nitrite, UA: NEGATIVE
Protein, UA: POSITIVE — AB
Spec Grav, UA: 1.015 (ref 1.010–1.025)
Urobilinogen, UA: 0.2 U/dL
pH, UA: 6 (ref 5.0–8.0)

## 2023-10-11 LAB — POCT GLYCOSYLATED HEMOGLOBIN (HGB A1C): Hemoglobin A1C: 5.9 % — AB (ref 4.0–5.6)

## 2023-10-11 MED ORDER — CHLORTHALIDONE 25 MG PO TABS: 25.0000 mg | ORAL_TABLET | Freq: Every day | ORAL | 1 refills | Status: DC

## 2023-10-11 NOTE — Patient Instructions (Addendum)
So current meds not working  and you have some swelling.that is new   Potassium  level is ok but now you have leg cramps again.  We can a different diuretic  adding today .  Because of the protein now in urine  I want t you to see nephrology .  They can also help with   BP control.  Cardiology  can  also help [place] with  managing  since  many options we have tried are not working  or have side effects   to control BP . I will do record review  also .  Plan lab in 3-4 weeks  then we will have fu visit  depending cardiology and nephrology help  or 2 months   Try off the gabapentin to see if helps  fatigue.

## 2023-10-12 LAB — MICROALBUMIN / CREATININE URINE RATIO
Creatinine,U: 64.8 mg/dL
Microalb Creat Ratio: 2309.4 mg/g — ABNORMAL HIGH (ref 0.0–30.0)
Microalb, Ur: 149.6 mg/dL — ABNORMAL HIGH (ref 0.0–1.9)

## 2023-10-13 NOTE — Progress Notes (Signed)
Urine confirms  excess protein in urine as discussed . Please escalate the nephrology referral  to urgent ASAP if not already done.

## 2023-10-16 ENCOUNTER — Other Ambulatory Visit: Payer: Self-pay | Admitting: Internal Medicine

## 2023-11-01 ENCOUNTER — Other Ambulatory Visit (INDEPENDENT_AMBULATORY_CARE_PROVIDER_SITE_OTHER): Payer: Medicare Other

## 2023-11-01 DIAGNOSIS — R5383 Other fatigue: Secondary | ICD-10-CM

## 2023-11-01 DIAGNOSIS — N1832 Chronic kidney disease, stage 3b: Secondary | ICD-10-CM

## 2023-11-01 DIAGNOSIS — Z79899 Other long term (current) drug therapy: Secondary | ICD-10-CM

## 2023-11-01 DIAGNOSIS — R809 Proteinuria, unspecified: Secondary | ICD-10-CM | POA: Diagnosis not present

## 2023-11-01 DIAGNOSIS — I10 Essential (primary) hypertension: Secondary | ICD-10-CM

## 2023-11-01 DIAGNOSIS — E039 Hypothyroidism, unspecified: Secondary | ICD-10-CM

## 2023-11-01 LAB — CBC WITH DIFFERENTIAL/PLATELET
Basophils Absolute: 0.1 10*3/uL (ref 0.0–0.1)
Basophils Relative: 0.6 % (ref 0.0–3.0)
Eosinophils Absolute: 0.2 10*3/uL (ref 0.0–0.7)
Eosinophils Relative: 2.4 % (ref 0.0–5.0)
HCT: 35.3 % — ABNORMAL LOW (ref 36.0–46.0)
Hemoglobin: 11.8 g/dL — ABNORMAL LOW (ref 12.0–15.0)
Lymphocytes Relative: 24.1 % (ref 12.0–46.0)
Lymphs Abs: 2.1 10*3/uL (ref 0.7–4.0)
MCHC: 33.5 g/dL (ref 30.0–36.0)
MCV: 89.6 fl (ref 78.0–100.0)
Monocytes Absolute: 0.6 10*3/uL (ref 0.1–1.0)
Monocytes Relative: 6.7 % (ref 3.0–12.0)
Neutro Abs: 5.7 10*3/uL (ref 1.4–7.7)
Neutrophils Relative %: 66.2 % (ref 43.0–77.0)
Platelets: 258 10*3/uL (ref 150.0–400.0)
RBC: 3.94 Mil/uL (ref 3.87–5.11)
RDW: 14.5 % (ref 11.5–15.5)
WBC: 8.6 10*3/uL (ref 4.0–10.5)

## 2023-11-01 LAB — HEPATIC FUNCTION PANEL
ALT: 14 U/L (ref 0–35)
AST: 20 U/L (ref 0–37)
Albumin: 4 g/dL (ref 3.5–5.2)
Alkaline Phosphatase: 67 U/L (ref 39–117)
Bilirubin, Direct: 0.1 mg/dL (ref 0.0–0.3)
Total Bilirubin: 0.4 mg/dL (ref 0.2–1.2)
Total Protein: 6.6 g/dL (ref 6.0–8.3)

## 2023-11-01 LAB — BASIC METABOLIC PANEL
BUN: 34 mg/dL — ABNORMAL HIGH (ref 6–23)
CO2: 31 meq/L (ref 19–32)
Calcium: 10.6 mg/dL — ABNORMAL HIGH (ref 8.4–10.5)
Chloride: 104 meq/L (ref 96–112)
Creatinine, Ser: 1.39 mg/dL — ABNORMAL HIGH (ref 0.40–1.20)
GFR: 36.04 mL/min — ABNORMAL LOW (ref >60.00)
Glucose, Bld: 123 mg/dL — ABNORMAL HIGH (ref 70–99)
Potassium: 4.1 meq/L (ref 3.5–5.1)
Sodium: 141 meq/L (ref 135–145)

## 2023-11-01 LAB — MAGNESIUM: Magnesium: 1.6 mg/dL (ref 1.5–2.5)

## 2023-11-01 LAB — TSH: TSH: 2.97 u[IU]/mL (ref 0.35–5.50)

## 2023-11-02 ENCOUNTER — Ambulatory Visit (HOSPITAL_BASED_OUTPATIENT_CLINIC_OR_DEPARTMENT_OTHER): Payer: Medicare Other | Admitting: Cardiology

## 2023-11-02 ENCOUNTER — Encounter (HOSPITAL_BASED_OUTPATIENT_CLINIC_OR_DEPARTMENT_OTHER): Payer: Self-pay | Admitting: Cardiology

## 2023-11-02 VITALS — BP 152/62 | HR 56 | Ht 61.5 in | Wt 146.1 lb

## 2023-11-02 DIAGNOSIS — Z87442 Personal history of urinary calculi: Secondary | ICD-10-CM | POA: Diagnosis not present

## 2023-11-02 DIAGNOSIS — I340 Nonrheumatic mitral (valve) insufficiency: Secondary | ICD-10-CM

## 2023-11-02 DIAGNOSIS — E039 Hypothyroidism, unspecified: Secondary | ICD-10-CM | POA: Diagnosis not present

## 2023-11-02 DIAGNOSIS — Z7189 Other specified counseling: Secondary | ICD-10-CM

## 2023-11-02 DIAGNOSIS — R5383 Other fatigue: Secondary | ICD-10-CM

## 2023-11-02 DIAGNOSIS — R0683 Snoring: Secondary | ICD-10-CM

## 2023-11-02 DIAGNOSIS — Z9189 Other specified personal risk factors, not elsewhere classified: Secondary | ICD-10-CM

## 2023-11-02 DIAGNOSIS — I10 Essential (primary) hypertension: Secondary | ICD-10-CM

## 2023-11-02 LAB — PTH, INTACT AND CALCIUM
Calcium: 10.5 mg/dL — ABNORMAL HIGH (ref 8.6–10.4)
PTH: 54 pg/mL (ref 16–77)

## 2023-11-02 MED ORDER — DOXAZOSIN MESYLATE 2 MG PO TABS: 2.0000 mg | ORAL_TABLET | Freq: Every day | ORAL | 11 refills | Status: DC

## 2023-11-02 NOTE — Progress Notes (Signed)
 Cardiology Office Note:  .   Date:  11/02/2023  ID:  Kerry Walters, DOB November 28, 1945, MRN [id number] PCP: Kerry Headings, MD  [facility] Providers Cardiologist:  Jodelle Red, MD {  History of Present Illness: Kerry Walters   Kerry Walters is a 78 y.o. 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  Today: Referral from 08/10/23 reviewed. Per note from Dr. Fabian Sharp, she was noted to have MR on echo. Blood pressure has been difficult to control. She was referred to cardiology as well as nephrology as her urine now shows protein.   Echo shows EF 60-65%, indeterminate DD, normal RV, mild-moderate MR, RAP 3.  Current hypertension medications:       Sig   carvedilol (COREG) 25 MG tablet (Taking) Take 1 tablet (25 mg total) by mouth 2 (two) times daily with a meal.   chlorthalidone (HYGROTON) 25 MG tablet (Taking) Take 1 tablet (25 mg total) by mouth daily.   spironolactone (ALDACTONE) 25 MG tablet (Taking) TAKE 1 TABLET BY MOUTH DAILY   valsartan (DIOVAN) 320 MG tablet (Taking) Take 1 tablet (320 mg total) by mouth daily.       Has been amlodipine, has swelling reported on allergies, she thinks her eyes swelled. Also has tried clonidine, had significant swelling after the second dose. She also felt severely drowsy on the clonidine. Recently started chlorthalidone for leg swelling about two weeks ago, this has help completely clear her swelling. Started spironolactone in the last several months but did not see much improvement. Has been on valsartan since about 2019, was on a lower dose with combo pill (hydrochlorothiazide) for several years and now on valsartan alone.  Started carvedilol 03/2023.  Has appt with nephrology 3/8 to establish care.  Almost never checks home blood pressure. We discussed how to best do this today.   Secondary Causes of  Hypertension  Medications/Herbal: Denies OCP but on estrogen topical cream only (recommended by her urologist). Denies steroids, stimulants, antidepressants, weight loss medication, immune suppressants, NSAIDs, sympathomimetics. Denies alcohol, licorice, ginseng, St. John's wort, chemo. Minimal caffeine use, sometimes decaf.  Sleep Apnea: no history. Was told she snores rarely. Does have daytime fatigue. Renal artery stenosis: no known history of this. Has scarring from having many prior kidney stones. Discussed possible Doppler Hyperaldosteronism: no evidence of this on labs 06/2023 Hyper/hypothyroidism: TSH has been normal on her current synthroid dose, has been stable for a long time Pheochromocytoma: no symptoms other than occasional palpitations at night. No metanephrines that I see Cushing's syndrome: AM cortisol actually borderline low 06/2023 labs Coarctation of the aorta   Has had 32 kidney stones, need to be aware of this when starting new meds.  Has occasional palpitations/racing beats when sleeping.  ROS: Denies chest pain, shortness of breath at rest or with normal exertion. No PND, orthopnea, LE edema or unexpected weight gain. No syncope or palpitations. ROS otherwise negative except as noted.   Studies Reviewed: Kerry Walters    EKG:  EKG Interpretation Date/Time:  Thursday November 02 2023 09:43:48 EST Ventricular Rate:  59 PR Interval:  158 QRS Duration:  146 QT Interval:  460 QTC Calculation: 455 R Axis:   -74  Text Interpretation: Sinus bradycardia Right bundle branch block Left anterior fascicular block Bifascicular block Confirmed by Jodelle Red [phone]) on 11/02/2023 10:07:26 AM    Physical Exam:   VS:  BP Kerry Walters)  162/68   Pulse (!) 56   Ht 5' 1.5" (1.562 m)   Wt 146 lb 1.6 oz (66.3 kg)   SpO2 97%   BMI 27.16 kg/m    Wt Readings from Last 3 Encounters:  11/02/23 146 lb 1.6 oz (66.3 kg)  10/11/23 148 lb 3.2 oz (67.2 kg)  08/10/23 149 lb 3.2 oz (67.7 kg)    GEN:  Well nourished, well developed in no acute distress HEENT: Normal, moist mucous membranes NECK: No JVD CARDIAC: regular rhythm, normal S1 and S2, no rubs or gallops. 1-2/6 holosystolic murmur. VASCULAR: Radial and DP pulses 2+ bilaterally. No carotid bruits RESPIRATORY:  Clear to auscultation without rales, wheezing or rhonchi  ABDOMEN: Soft, non-tender, non-distended MUSCULOSKELETAL:  Ambulates independently SKIN: Warm and dry, no edema NEUROLOGIC:  Alert and oriented x 3. No focal neuro deficits noted. PSYCHIATRIC:  Normal affect    ASSESSMENT AND PLAN: .    Difficult to manage hypertension -multiple intolerances/sensitivities -starting doxazosin 2 mg at bedtime -secondary causes reviewed above. Remaining items as below -has appt 3/8 with nephrology -discussed potential sleep apnea. Does snore, has daytime fatigue. stop bang 4. Ordered itamar sleep study. Would benefit from CPAP or other treatment if OSA found given her hypertension -continue carvedilol 25 mg BID, chlorthalidone 25 mg daily, spironolactone 25 mg daily, valsartan 320 mg daily -has hypothyroidism, well controlled -does have history of many kidney stones, need to be aware of this with medication choices  At follow up: -determine what nephrologist changed/ordered -consider renal doppler and metanephrines if not at goal  Mitral regurgitation -discussed that BP management can also help with this -asymptomatic  Elevated ASCVD risk -very high risk, but no known history of ASCVD -continue atorvastatin 20 mg daily  CV risk counseling and prevention -recommend heart healthy/Mediterranean diet, with whole grains, fruits, vegetable, fish, lean meats, nuts, and olive oil. Limit salt. -recommend moderate walking, 3-5 times/week for 30-50 minutes each session. Aim for at least 150 minutes.week. Goal should be pace of 3 miles/hours, or walking 1.5 miles in 30 minutes -recommend avoidance of tobacco products. Avoid excess  alcohol. -ASCVD risk score: The 10-year ASCVD risk score (Arnett DK, et al., 2019) is: 67.4%   Values used to calculate the score:     Age: 41 years     Sex: Female     Is Non-Hispanic African American: No     Diabetic: Yes     Tobacco smoker: No     Systolic Blood Pressure: 162 mmHg     Is BP treated: Yes     HDL Cholesterol: 40 mg/dL     Total Cholesterol: 143 mg/dL    Dispo: 6 weeks with Luther Parody Walker/advanced hypertension clinic  Signed, Jodelle Red, MD   Jodelle Red, MD, PhD, [facility] [place]  [facility]  [place]  [facility] at [facility] at [facility] [address] [place], [place] [address] [phone]

## 2023-11-02 NOTE — Patient Instructions (Signed)
 Medication Instructions:  Your physician has recommended you make the following change in your medication:   Start Doxazosin 2 mg daily  *If you need a refill on your cardiac medications before your next appointment, please call your pharmacy*  Testing/Procedures: Your physician recommends that you have a sleep study.   Follow-Up: At [facility], you and your health needs are our priority.  As part of our continuing mission to provide you with exceptional heart care, we have created designated Provider Care Teams.  These Care Teams include your primary Cardiologist (physician) and Advanced Practice Providers (APPs -  Physician Assistants and Nurse Practitioners) who all work together to provide you with the care you need, when you need it.  We recommend signing up for the patient portal called "MyChart".  Sign up information is provided on this After Visit Summary.  MyChart is used to connect with patients for Virtual Visits (Telemedicine).  Patients are able to view lab/test results, encounter notes, upcoming appointments, etc.  Non-urgent messages can be sent to your provider as well.   To learn more about what you can do with MyChart, go to [email or web address].    Your next appointment:   6 week(s) (Advanced Hypertension Clinic)  Provider:   Gillian Shields, NP

## 2023-11-04 DIAGNOSIS — I129 Hypertensive chronic kidney disease with stage 1 through stage 4 chronic kidney disease, or unspecified chronic kidney disease: Secondary | ICD-10-CM | POA: Diagnosis not present

## 2023-11-04 DIAGNOSIS — D3501 Benign neoplasm of right adrenal gland: Secondary | ICD-10-CM | POA: Diagnosis not present

## 2023-11-04 DIAGNOSIS — E1122 Type 2 diabetes mellitus with diabetic chronic kidney disease: Secondary | ICD-10-CM | POA: Diagnosis not present

## 2023-11-06 ENCOUNTER — Other Ambulatory Visit (HOSPITAL_COMMUNITY): Payer: Self-pay | Admitting: Nephrology

## 2023-11-06 DIAGNOSIS — M9905 Segmental and somatic dysfunction of pelvic region: Secondary | ICD-10-CM | POA: Diagnosis not present

## 2023-11-06 DIAGNOSIS — M9903 Segmental and somatic dysfunction of lumbar region: Secondary | ICD-10-CM | POA: Diagnosis not present

## 2023-11-06 DIAGNOSIS — M9904 Segmental and somatic dysfunction of sacral region: Secondary | ICD-10-CM | POA: Diagnosis not present

## 2023-11-06 DIAGNOSIS — M51361 Other intervertebral disc degeneration, lumbar region with lower extremity pain only: Secondary | ICD-10-CM | POA: Diagnosis not present

## 2023-11-06 DIAGNOSIS — N189 Chronic kidney disease, unspecified: Secondary | ICD-10-CM

## 2023-11-15 DIAGNOSIS — M9904 Segmental and somatic dysfunction of sacral region: Secondary | ICD-10-CM | POA: Diagnosis not present

## 2023-11-15 DIAGNOSIS — M9905 Segmental and somatic dysfunction of pelvic region: Secondary | ICD-10-CM | POA: Diagnosis not present

## 2023-11-15 DIAGNOSIS — M9903 Segmental and somatic dysfunction of lumbar region: Secondary | ICD-10-CM | POA: Diagnosis not present

## 2023-11-15 DIAGNOSIS — M51361 Other intervertebral disc degeneration, lumbar region with lower extremity pain only: Secondary | ICD-10-CM | POA: Diagnosis not present

## 2023-11-17 ENCOUNTER — Ambulatory Visit (HOSPITAL_COMMUNITY)
Admission: RE | Admit: 2023-11-17 | Discharge: 2023-11-17 | Disposition: A | Discharge status: Home / Self Care | Source: Ambulatory Visit | Attending: Nephrology | Admitting: Nephrology

## 2023-11-17 ENCOUNTER — Other Ambulatory Visit: Payer: Self-pay | Admitting: Family

## 2023-11-17 DIAGNOSIS — N189 Chronic kidney disease, unspecified: Secondary | ICD-10-CM | POA: Insufficient documentation

## 2023-11-17 DIAGNOSIS — N2889 Other specified disorders of kidney and ureter: Secondary | ICD-10-CM | POA: Diagnosis not present

## 2023-11-20 ENCOUNTER — Ambulatory Visit
Admission: EM | Admit: 2023-11-20 | Discharge: 2023-11-20 | Disposition: A | Discharge status: Home / Self Care | Attending: Nurse Practitioner | Admitting: Nurse Practitioner

## 2023-11-20 DIAGNOSIS — L720 Epidermal cyst: Secondary | ICD-10-CM

## 2023-11-20 MED ORDER — CHLORHEXIDINE GLUCONATE 4 % EX SOLN: CUTANEOUS | 0 refills | Status: DC

## 2023-11-20 MED ORDER — DOXYCYCLINE HYCLATE 100 MG PO TABS: 100.0000 mg | ORAL_TABLET | Freq: Two times a day (BID) | ORAL | 0 refills | Status: AC

## 2023-11-20 NOTE — ED Triage Notes (Addendum)
 Pt reports a knot on right leg x 1 month, there is a black sore in the center of the knot and redness on and around the knot. Pt states it has been causing pain in the last few days.

## 2023-11-20 NOTE — Discharge Instructions (Addendum)
 Take medication as prescribed. May take over-the-counter Tylenol as needed for pain, fever, or general discomfort. Warm compresses to the affected area 3-4 times daily. Clean the area at least twice daily with antibacterial solution provided.  Keep the area covered while it is draining. Go to the emergency department if you develop fever, chills, generalized fatigue, nausea, vomiting, or if the area of redness spreads up or down the leg, becomes larger, or develops foul-smelling drainage. Follow-up with dermatologist as scheduled. Follow-up as needed.

## 2023-11-20 NOTE — ED Provider Notes (Signed)
 Gross reviewed.  Later on we will order some brown-pink syndrome therapy [facility]    CSN: [id number] Arrival date & time: 11/20/23  0906      History   Chief Complaint No chief complaint on file.   HPI Kerry Walters is a 78 y.o. female.   The history is provided by the patient.   Patient presents for complaints of a "knot" on the right leg that is been present for the past month.  Patient states that the area is "black" in the middle, and the area has increased in size, and has become more sore and red.  She denies fever, chills, foul-smelling drainage, numbness, tingling, or weakness in the lower extremity.  Patient states she is unsure if something may have bit her.  She states that she has tried "poking and squeezing" the area with minimal relief of her symptoms.  Denies prior history of same.  Patient states she has scheduled an appointment with dermatology to be seen in 3 weeks.  Past Medical History:  Diagnosis Date   Allergy    Asthma due to environmental allergies    no inhaler   Diabetes mellitus without complication (HCC)    Diverticulosis of colon    GERD (gastroesophageal reflux disease)    History of esophageal dilatation    FOR STRICTURE IN 2005   History of hiatal hernia    History of hypercalcemia    SECONDARY TO HYPERPARATHYROID   History of hyperparathyroidism    PRIMARY--  S/P RIGHT PARATHYROIDECTOMY   History of kidney stones    History of recurrent UTIs    Hyperlipidemia    Hypertension    Hypothyroidism    Incomplete right bundle branch block    Iron deficiency anemia due to chronic blood loss 12/01/2021   Nephrolithiasis    RIGHT    Nocturia    PONV (postoperative nausea and vomiting)    Pre-diabetes    Sciatica of right side     Patient Active Problem List   Diagnosis Date Noted   Adrenal nodule (HCC) 08/11/2023   Type 2 diabetes mellitus with hyperglycemia, without long-term current use of insulin (HCC) 08/11/2023   Iron  deficiency anemia due to chronic blood loss 12/01/2021   Personal history of kidney stones 02/23/2015   Atherosclerosis 02/23/2015   Hx of low back pain 05/06/2014   Encounter for preventive health examination 05/06/2014   BMI 32.0-32.9,adult 11/05/2013   Throat clearing 05/03/2013   Nocturnal leg cramps 10/31/2012   Low back pain 12/27/2011   Pre-diabetes 09/27/2011   Menopausal hot flushes 04/12/2011   Knee pain 11/22/2010   OBESITY 12/23/2008   NEPHROLITHIASIS, HX OF 03/13/2008   Hypothyroidism 06/29/2007   HYPERLIPIDEMIA 06/29/2007   Essential hypertension 06/29/2007   ALLERGIC RHINITIS 06/29/2007   GERD 06/29/2007   POSTMENOPAUSAL STATUS 06/29/2007   LEG PAIN, CHRONIC 06/29/2007   HYPERGLYCEMIA, FASTING 06/29/2007    Past Surgical History:  Procedure Laterality Date   APPENDECTOMY  1974   COLONOSCOPY WITH PROPOFOL N/A 01/12/2023   Procedure: COLONOSCOPY WITH PROPOFOL;  Surgeon: Lemar Lofty., MD;  Location: [facility];  Service: Gastroenterology;  Laterality: N/A;   CYSTOSCOPY WITH URETEROSCOPY AND STENT PLACEMENT Right 11/11/2014   Procedure: CYSTOSCOPY WITH URETEROSCOPY AND STENT PLACEMENT;  Surgeon: Crist Fat, MD;  Location: [facility];  Service: Urology;  Laterality: Right;   ENDOSCOPIC MUCOSAL RESECTION N/A 01/12/2023   Procedure: ENDOSCOPIC MUCOSAL RESECTION;  Surgeon: Meridee Score Netty Starring., MD;  Location:  [facility];  Service: Gastroenterology;  Laterality: N/A;   ESOPHAGOGASTRODUODENOSCOPY (EGD) WITH PROPOFOL N/A 01/12/2023   Procedure: ESOPHAGOGASTRODUODENOSCOPY (EGD) WITH PROPOFOL;  Surgeon: Meridee Score Netty Starring., MD;  Location: [facility];  Service: Gastroenterology;  Laterality: N/A;   EXCISION URETHRAL CARUNCLE  1981   Open procedure   HEMOSTASIS CLIP PLACEMENT  01/12/2023   Procedure: HEMOSTASIS CLIP PLACEMENT;  Surgeon: Lemar Lofty., MD;  Location: [facility];  Service: Gastroenterology;;   HEMOSTASIS  CONTROL  01/12/2023   Procedure: HEMOSTASIS CONTROL;  Surgeon: Lemar Lofty., MD;  Location: [facility];  Service: Gastroenterology;;   KNEE ARTHROSCOPY Left april 2010   POLYPECTOMY  01/12/2023   Procedure: POLYPECTOMY;  Surgeon: Meridee Score Netty Starring., MD;  Location: [facility];  Service: Gastroenterology;;  gastric and colon   REMOVAL RIP  1973   First Rib on Right side-- for impingement   RIGHT INFERIOR PARATHYROIDECTOMY, MINIMALLY INVASIVE  08-14-2008   SUBMUCOSAL LIFTING INJECTION  01/12/2023   Procedure: SUBMUCOSAL LIFTING INJECTION;  Surgeon: Lemar Lofty., MD;  Location: [facility];  Service: Gastroenterology;;   TONSILLECTOMY  as child   TOTAL KNEE ARTHROPLASTY Left 11-17-2009   TUBAL LIGATION  1973   VAGINAL HYSTERECTOMY  1995   w/ Bilateral Salpingoophorectomy    OB History   No obstetric history on file.      Home Medications    Prior to Admission medications   Medication Sig Start Date End Date Taking? Authorizing Provider  chlorhexidine (HIBICLENS) 4 % external liquid Mix a small amount of the solution and warm water and cleanse the area twice daily until symptoms improve. 11/20/23  Yes Leath-Warren, Sadie Haber, NP  doxycycline (VIBRA-TABS) 100 MG tablet Take 1 tablet (100 mg total) by mouth 2 (two) times daily for 7 days. 11/20/23 11/27/23 Yes Leath-Warren, Sadie Haber, NP  acetaminophen (TYLENOL) 500 MG tablet Take 500-1,000 mg by mouth every 6 (six) hours as needed for moderate pain.    [provider]  atorvastatin (LIPITOR) 20 MG tablet TAKE 1 TABLET BY MOUTH IN THE  EVENING 05/09/23   Panosh, Neta Mends, MD  carvedilol (COREG) 25 MG tablet Take 1 tablet (25 mg total) by mouth 2 (two) times daily with a meal. 06/12/23   Worthy Rancher B, FNP  cetirizine (ZYRTEC) 10 MG tablet Take 10 mg by mouth daily as needed for allergies.    [provider]  chlorthalidone (HYGROTON) 25 MG tablet Take 1 tablet (25 mg total) by mouth daily.  10/11/23   Panosh, Neta Mends, MD  Cholecalciferol (VITAMIN D) 50 MCG (2000 UT) tablet Take 6,000 Units by mouth daily.    [provider]  conjugated estrogens (PREMARIN) vaginal cream Place vaginally as directed. Use 2-3 times weekly  prnPlace 1 Applicatorful vaginally as directed. Use 2-3 times weekly  prn Patient taking differently: Place vaginally as directed. 1 Applicatorful vaginally as directed. Use 2-3 times weekly  prn 06/14/22   Panosh, Neta Mends, MD  Cyanocobalamin (B-12) 3000 MCG CAPS Take 3,000 mcg by mouth daily.    [provider]  doxazosin (CARDURA) 2 MG tablet Take 1 tablet (2 mg total) by mouth at bedtime. 11/02/23   Jodelle Red, MD  ferrous sulfate 325 (65 FE) MG tablet Take 1 tablet (325 mg total) by mouth every other day. Patient taking differently: Take 325 mg by mouth daily. 04/16/21   Tressia Danas, MD  fluticasone (FLONASE) 50 MCG/ACT nasal spray Place 1 spray into both nostrils daily as needed for allergies or rhinitis.  [provider]  gabapentin (NEURONTIN) 100 MG capsule TAKE 1 TO 3 CAPSULES BY MOUTH AT BEDTIME AS DIRECTED Patient taking differently: Take 100-200 mg by mouth daily as needed (pain). 05/27/22   Panosh, Neta Mends, MD  latanoprost (XALATAN) 0.005 % ophthalmic solution Place 1 drop into both eyes at bedtime. 11/18/21   [provider]  levothyroxine (SYNTHROID) 75 MCG tablet TAKE 1 TABLET BY MOUTH DAILY 02/15/23   Worthy Rancher B, FNP  magnesium oxide (MAG-OX) 400 (240 Mg) MG tablet Take 800 mg by mouth daily.    [provider]  metFORMIN (GLUCOPHAGE-XR) 500 MG 24 hr tablet TAKE 3 TABLETS BY MOUTH DAILY 05/09/23   Panosh, Neta Mends, MD  OVER THE COUNTER MEDICATION Take 1 tablet by mouth daily. Total Beets supplements    [provider]  pantoprazole (PROTONIX) 40 MG tablet Take 1 tablet (40 mg total) by mouth 2 (two) times daily before a meal. 05/11/23   Mansouraty, Netty Starring., MD  spironolactone  (ALDACTONE) 25 MG tablet TAKE 1 TABLET BY MOUTH DAILY 10/16/23   Worthy Rancher B, FNP  Turmeric 500 MG CAPS Take 1,000 mg by mouth daily.    [provider]  valsartan (DIOVAN) 320 MG tablet Take 1 tablet (320 mg total) by mouth daily. 06/22/23   Panosh, Neta Mends, MD    Family History Family History  Problem Relation Age of Onset   Cancer Father        brain   Cancer Son        liver   Diabetes Sister    Schizophrenia Son    Colon cancer Neg Hx    Colon polyps Neg Hx    Esophageal cancer Neg Hx    Stomach cancer Neg Hx    Pancreatic cancer Neg Hx    Rectal cancer Neg Hx     Social History Social History   Tobacco Use   Smoking status: Never   Smokeless tobacco: Never  Vaping Use   Vaping status: Never Used  Substance Use Topics   Alcohol use: No   Drug use: No     Allergies   Contrast media [iodinated contrast media], Amlodipine, Clonidine derivatives, Codeine, Shrimp [shellfish allergy], and Topamax [topiramate]   Review of Systems Review of Systems Per HPI  Physical Exam Triage Vital Signs ED Triage Vitals  Encounter Vitals Group     BP --      Systolic BP Percentile --      Diastolic BP Percentile --      Pulse Rate 11/20/23 1007 (!) 51     Resp 11/20/23 1007 18     Temp 11/20/23 1007 98.7 F (37.1 C)     Temp Source 11/20/23 1007 Oral     SpO2 11/20/23 1007 98 %     Weight --      Height --      Head Circumference --      Peak Flow --      Pain Score 11/20/23 1005 3     Pain Loc --      Pain Education --      Exclude from Growth Chart --    No data found.  Updated Vital Signs Pulse (!) 51   Temp 98.7 F (37.1 C) (Oral)   Resp 18   SpO2 98%   Visual Acuity Right Eye Distance:   Left Eye Distance:   Bilateral Distance:    Right Eye Near:   Left Eye Near:  Bilateral Near:     Physical Exam Vitals and nursing note reviewed.  Constitutional:      General: She is not in acute distress.    Appearance: Normal appearance.   HENT:     Head: Normocephalic.  Eyes:     Extraocular Movements: Extraocular movements intact.     Pupils: Pupils are equal, round, and reactive to light.  Pulmonary:     Effort: Pulmonary effort is normal.  Musculoskeletal:     Cervical back: Normal range of motion.  Skin:    General: Skin is warm and dry.     Comments: Firm, flesh-colored nodule noted to the anterior right lower leg.  Area is firm, round, with central punctum noted.  Nodule with erythematous base.  There is no oozing, fluctuance, or drainage present.  Neurological:     General: No focal deficit present.     Mental Status: She is alert and oriented to person, place, and time.  Psychiatric:        Mood and Affect: Mood normal.        Behavior: Behavior normal.      UC Treatments / Results  Labs (all labs ordered are listed, but only abnormal results are displayed) Labs Reviewed - No data to display  EKG   Radiology No results found.  Procedures Procedures (including critical care time)  Medications Ordered in UC Medications - No data to display  Initial Impression / Assessment and Plan / UC Course  I have reviewed the triage vital signs and the nursing notes.  Pertinent labs & imaging results that were available during my care of the patient were reviewed by me and considered in my medical decision making (see chart for details).  Symptoms consistent with epidermoid cyst.  Cyst is not amenable to I&D at this time.  Will treat empirically with doxycycline 100 mg along with Hibiclens 4% external solution.  Supportive care recommendations were provided and discussed with the patient to include over-the-counter analgesics, warm compresses, and to eat keep the area clean and dry.  Patient was advised to discontinue squeezing or puncturing the area.  Discussed indications with follow-up.  Patient states she is scheduled to see dermatology in 3 weeks.  Patient advised to follow-up with dermatology if symptoms  fail to improve.  Patient was in agreement with this plan of care and verbalized understanding.  All questions were answered.  Patient stable for discharge  Final Clinical Impressions(s) / UC Diagnoses   Final diagnoses:  Epidermoid cyst     Discharge Instructions      Take medication as prescribed. May take over-the-counter Tylenol as needed for pain, fever, or general discomfort. Warm compresses to the affected area 3-4 times daily. Clean the area at least twice daily with antibacterial solution provided.  Keep the area covered while it is draining. Go to the emergency department if you develop fever, chills, generalized fatigue, nausea, vomiting, or if the area of redness spreads up or down the leg, becomes larger, or develops foul-smelling drainage. Follow-up with dermatologist as scheduled. Follow-up as needed.     ED Prescriptions     Medication Sig Dispense Auth. Provider   doxycycline (VIBRA-TABS) 100 MG tablet Take 1 tablet (100 mg total) by mouth 2 (two) times daily for 7 days. 14 tablet Leath-Warren, Sadie Haber, NP   chlorhexidine (HIBICLENS) 4 % external liquid Mix a small amount of the solution and warm water and cleanse the area twice daily until symptoms improve. 118 mL Leath-Warren, Lorene Dy  J, NP      PDMP not reviewed this encounter.   Abran Cantor, NP 11/20/23 1056

## 2023-11-27 ENCOUNTER — Telehealth: Payer: Self-pay

## 2023-11-27 DIAGNOSIS — R809 Proteinuria, unspecified: Secondary | ICD-10-CM | POA: Diagnosis not present

## 2023-11-27 DIAGNOSIS — N1832 Chronic kidney disease, stage 3b: Secondary | ICD-10-CM | POA: Diagnosis not present

## 2023-11-27 DIAGNOSIS — E1129 Type 2 diabetes mellitus with other diabetic kidney complication: Secondary | ICD-10-CM | POA: Diagnosis not present

## 2023-11-27 DIAGNOSIS — E1122 Type 2 diabetes mellitus with diabetic chronic kidney disease: Secondary | ICD-10-CM | POA: Diagnosis not present

## 2023-11-27 NOTE — Telephone Encounter (Signed)
**Note De-Identified Margaretmary Prisk Obfuscation** Ordering provider: Dr Cristal Deer Associated diagnoses: Snoring-R06.83 and Fatigue-R53.83  WatchPAT PA obtained on 11/27/2023 by Jaiden Dinkins, Lorelle Formosa, LPN. Authorization: Per the [facility] Provider Portal: Prior Authorization/Notification is not required for the requested service(s) CPT Code: 29562 ([place]-HST). Decision ID #: [id number]  Patient notified of PIN ([phone]) on 11/27/2023 Alida Greiner Notification Method: phone.  Phone note routed to covering staff for follow-up.

## 2023-12-04 ENCOUNTER — Ambulatory Visit
Admission: EM | Admit: 2023-12-04 | Discharge: 2023-12-04 | Disposition: A | Discharge status: Home / Self Care | Attending: Internal Medicine | Admitting: Internal Medicine

## 2023-12-04 DIAGNOSIS — M10372 Gout due to renal impairment, left ankle and foot: Secondary | ICD-10-CM | POA: Diagnosis not present

## 2023-12-04 DIAGNOSIS — I10 Essential (primary) hypertension: Secondary | ICD-10-CM | POA: Diagnosis not present

## 2023-12-04 MED ORDER — PREDNISONE 20 MG PO TABS: 40.0000 mg | ORAL_TABLET | Freq: Every day | ORAL | 0 refills | Status: AC

## 2023-12-04 NOTE — Discharge Instructions (Addendum)
 Prednisone 40mg  once daily for 5 days to treat gout flare.  Ice and elevate the left foot.  Follow-up with primary care doctor as needed. Continue taking BP medications. Lower salt in diet, follow-up with PCP to discuss BP further.

## 2023-12-04 NOTE — ED Triage Notes (Signed)
 Swelling, pain and redness in left foot x 2 days.

## 2023-12-04 NOTE — ED Provider Notes (Signed)
 [facility]    CSN: [id number] Arrival date & time: 12/04/23  1220      History   Chief Complaint Chief Complaint  Patient presents with   Foot Pain    HPI Kerry Walters is a 78 y.o. female.   Kerry Walters is a 78 y.o. female presenting for chief complaint of pain and swelling to the dorsum of the left foot that started 2 days ago.  Denies recent trauma/injury to the foot contributing to pain.  History of chronic kidney disease and history of gout, states this feels similar to the last time she had gout to the right foot.  Pain is triggered by weightbearing activity and palpation. Reports pain is persistent at rest and does not travel to the toes or the ankle. Denies numbness/tingling distally to pain/swelling. No recent steroid use. She denies recent intake of foods high in purines. She has not attempted treatment of symptoms PTA.  BP elevated at 207/74. Denies CP, SOB, headache, dizziness, paresthesias, unilateral extremity weakness, and vision changes. Takes BP medications (valsartan and coreg) daily without missed doses.  States her husband died recently (3 days ago) and she has been struggling with grief and stress causing BP to become elevated.      Past Medical History:  Diagnosis Date   Allergy    Asthma due to environmental allergies    no inhaler   Diabetes mellitus without complication (HCC)    Diverticulosis of colon    GERD (gastroesophageal reflux disease)    History of esophageal dilatation    FOR STRICTURE IN 2005   History of hiatal hernia    History of hypercalcemia    SECONDARY TO HYPERPARATHYROID   History of hyperparathyroidism    PRIMARY--  S/P RIGHT PARATHYROIDECTOMY   History of kidney stones    History of recurrent UTIs    Hyperlipidemia    Hypertension    Hypothyroidism    Incomplete right bundle branch block    Iron deficiency anemia due to chronic blood loss 12/01/2021   Nephrolithiasis    RIGHT    Nocturia    PONV  (postoperative nausea and vomiting)    Pre-diabetes    Sciatica of right side     Patient Active Problem List   Diagnosis Date Noted   Adrenal nodule (HCC) 08/11/2023   Type 2 diabetes mellitus with hyperglycemia, without long-term current use of insulin (HCC) 08/11/2023   Iron deficiency anemia due to chronic blood loss 12/01/2021   Personal history of kidney stones 02/23/2015   Atherosclerosis 02/23/2015   Hx of low back pain 05/06/2014   Encounter for preventive health examination 05/06/2014   BMI 32.0-32.9,adult 11/05/2013   Throat clearing 05/03/2013   Nocturnal leg cramps 10/31/2012   Low back pain 12/27/2011   Pre-diabetes 09/27/2011   Menopausal hot flushes 04/12/2011   Knee pain 11/22/2010   OBESITY 12/23/2008   NEPHROLITHIASIS, HX OF 03/13/2008   Hypothyroidism 06/29/2007   HYPERLIPIDEMIA 06/29/2007   Essential hypertension 06/29/2007   ALLERGIC RHINITIS 06/29/2007   GERD 06/29/2007   POSTMENOPAUSAL STATUS 06/29/2007   LEG PAIN, CHRONIC 06/29/2007   HYPERGLYCEMIA, FASTING 06/29/2007    Past Surgical History:  Procedure Laterality Date   APPENDECTOMY  1974   COLONOSCOPY WITH PROPOFOL N/A 01/12/2023   Procedure: COLONOSCOPY WITH PROPOFOL;  Surgeon: Lemar Lofty., MD;  Location: [facility];  Service: Gastroenterology;  Laterality: N/A;   CYSTOSCOPY WITH URETEROSCOPY AND STENT PLACEMENT Right 11/11/2014   Procedure: CYSTOSCOPY  WITH URETEROSCOPY AND STENT PLACEMENT;  Surgeon: Crist Fat, MD;  [facility]: [facility];  Service: Urology;  Laterality: Right;   ENDOSCOPIC MUCOSAL RESECTION N/A 01/12/2023   Procedure: ENDOSCOPIC MUCOSAL RESECTION;  Surgeon: Meridee Score Netty Starring., MD;  Location: [facility];  Service: Gastroenterology;  Laterality: N/A;   ESOPHAGOGASTRODUODENOSCOPY (EGD) WITH PROPOFOL N/A 01/12/2023   Procedure: ESOPHAGOGASTRODUODENOSCOPY (EGD) WITH PROPOFOL;  Surgeon: Meridee Score Netty Starring., MD;  Location: [facility];   Service: Gastroenterology;  Laterality: N/A;   EXCISION URETHRAL CARUNCLE  1981   Open procedure   HEMOSTASIS CLIP PLACEMENT  01/12/2023   Procedure: HEMOSTASIS CLIP PLACEMENT;  Surgeon: Lemar Lofty., MD;  Location: [facility];  Service: Gastroenterology;;   HEMOSTASIS CONTROL  01/12/2023   Procedure: HEMOSTASIS CONTROL;  Surgeon: Lemar Lofty., MD;  Location: [facility];  Service: Gastroenterology;;   KNEE ARTHROSCOPY Left april 2010   POLYPECTOMY  01/12/2023   Procedure: POLYPECTOMY;  Surgeon: Meridee Score Netty Starring., MD;  Location: [facility];  Service: Gastroenterology;;  gastric and colon   REMOVAL RIP  1973   First Rib on Right side-- for impingement   RIGHT INFERIOR PARATHYROIDECTOMY, MINIMALLY INVASIVE  08-14-2008   SUBMUCOSAL LIFTING INJECTION  01/12/2023   Procedure: SUBMUCOSAL LIFTING INJECTION;  Surgeon: Lemar Lofty., MD;  Location: [facility];  Service: Gastroenterology;;   TONSILLECTOMY  as child   TOTAL KNEE ARTHROPLASTY Left 11-17-2009   TUBAL LIGATION  1973   VAGINAL HYSTERECTOMY  1995   w/ Bilateral Salpingoophorectomy    OB History   No obstetric history on file.      Home Medications    Prior to Admission medications   Medication Sig Start Date End Date Taking? Authorizing Provider  atorvastatin (LIPITOR) 20 MG tablet TAKE 1 TABLET BY MOUTH IN THE  EVENING 05/09/23  Yes Panosh, Neta Mends, MD  carvedilol (COREG) 25 MG tablet Take 1 tablet (25 mg total) by mouth 2 (two) times daily with a meal. 06/12/23  Yes Worthy Rancher B, FNP  cetirizine (ZYRTEC) 10 MG tablet Take 10 mg by mouth daily as needed for allergies.   Yes [provider]  chlorthalidone (HYGROTON) 25 MG tablet Take 1 tablet (25 mg total) by mouth daily. 10/11/23  Yes Panosh, Neta Mends, MD  Cholecalciferol (VITAMIN D) 50 MCG (2000 UT) tablet Take 6,000 Units by mouth daily.   Yes [provider]  conjugated estrogens (PREMARIN) vaginal cream Place  vaginally as directed. Use 2-3 times weekly  prnPlace 1 Applicatorful vaginally as directed. Use 2-3 times weekly  prn Patient taking differently: Place vaginally as directed. 1 Applicatorful vaginally as directed. Use 2-3 times weekly  prn 06/14/22  Yes Panosh, Neta Mends, MD  Cyanocobalamin (B-12) 3000 MCG CAPS Take 3,000 mcg by mouth daily.   Yes [provider]  doxazosin (CARDURA) 2 MG tablet Take 1 tablet (2 mg total) by mouth at bedtime. 11/02/23  Yes Jodelle Red, MD  ferrous sulfate 325 (65 FE) MG tablet Take 1 tablet (325 mg total) by mouth every other day. Patient taking differently: Take 325 mg by mouth daily. 04/16/21  Yes Tressia Danas, MD  fluticasone (FLONASE) 50 MCG/ACT nasal spray Place 1 spray into both nostrils daily as needed for allergies or rhinitis.   Yes [provider]  levothyroxine (SYNTHROID) 75 MCG tablet TAKE 1 TABLET BY MOUTH DAILY 02/15/23  Yes Worthy Rancher B, FNP  magnesium oxide (MAG-OX) 400 (240 Mg) MG tablet Take 800 mg by mouth daily.   Yes  [provider]  metFORMIN (GLUCOPHAGE-XR) 500 MG 24 hr tablet TAKE 3 TABLETS BY MOUTH DAILY 05/09/23  Yes Panosh, Neta Mends, MD  OVER THE COUNTER MEDICATION Take 1 tablet by mouth daily. Total Beets supplements   Yes [provider]  pantoprazole (PROTONIX) 40 MG tablet Take 1 tablet (40 mg total) by mouth 2 (two) times daily before a meal. 05/11/23  Yes Mansouraty, Netty Starring., MD  predniSONE (DELTASONE) 20 MG tablet Take 2 tablets (40 mg total) by mouth daily for 5 days. 12/04/23 12/09/23 Yes Carlisle Beers, FNP  Turmeric 500 MG CAPS Take 1,000 mg by mouth daily.   Yes [provider]  valsartan (DIOVAN) 320 MG tablet Take 1 tablet (320 mg total) by mouth daily. 06/22/23  Yes Panosh, Neta Mends, MD  acetaminophen (TYLENOL) 500 MG tablet Take 500-1,000 mg by mouth every 6 (six) hours as needed for moderate pain.    [provider]  chlorhexidine (HIBICLENS) 4 %  external liquid Mix a small amount of the solution and warm water and cleanse the area twice daily until symptoms improve. 11/20/23   Leath-Warren, Sadie Haber, NP  gabapentin (NEURONTIN) 100 MG capsule TAKE 1 TO 3 CAPSULES BY MOUTH AT BEDTIME AS DIRECTED Patient taking differently: Take 100-200 mg by mouth daily as needed (pain). 05/27/22   Panosh, Neta Mends, MD  latanoprost (XALATAN) 0.005 % ophthalmic solution Place 1 drop into both eyes at bedtime. 11/18/21   [provider]  spironolactone (ALDACTONE) 25 MG tablet TAKE 1 TABLET BY MOUTH DAILY 10/16/23   Eulis Foster, FNP    Family History Family History  Problem Relation Age of Onset   Cancer Father        brain   Cancer Son        liver   Diabetes Sister    Schizophrenia Son    Colon cancer Neg Hx    Colon polyps Neg Hx    Esophageal cancer Neg Hx    Stomach cancer Neg Hx    Pancreatic cancer Neg Hx    Rectal cancer Neg Hx     Social History Social History   Tobacco Use   Smoking status: Never   Smokeless tobacco: Never  Vaping Use   Vaping status: Never Used  Substance Use Topics   Alcohol use: No   Drug use: No     Allergies   Contrast media [iodinated contrast media], Amlodipine, Clonidine derivatives, Codeine, Shrimp [shellfish allergy], and Topamax [topiramate]   Review of Systems Review of Systems Per HPI  Physical Exam Triage Vital Signs ED Triage Vitals  Encounter Vitals Group     BP 12/04/23 1233 (!) 207/74     Systolic BP Percentile --      Diastolic BP Percentile --      Pulse Rate 12/04/23 1233 60     Resp 12/04/23 1233 18     Temp 12/04/23 1233 (!) 97.4 F (36.3 C)     Temp Source 12/04/23 1233 Oral     SpO2 12/04/23 1233 95 %     Weight --      Height --      Head Circumference --      Peak Flow --      Pain Score 12/04/23 1234 8     Pain Loc --      Pain Education --      Exclude from Growth Chart --    No data found.  Updated Vital Signs BP [name])  207/74 (BP Location:  Right Arm)   Pulse 60   Temp (!) 97.4 F (36.3 C) (Oral)   Resp 18   SpO2 95%   Visual Acuity Right Eye Distance:   Left Eye Distance:   Bilateral Distance:    Right Eye Near:   Left Eye Near:    Bilateral Near:     Physical Exam Vitals and nursing note reviewed.  Constitutional:      Appearance: She is not ill-appearing or toxic-appearing.  HENT:     Head: Normocephalic and atraumatic.     Right Ear: Hearing and external ear normal.     Left Ear: Hearing and external ear normal.     Nose: Nose normal.     Mouth/Throat:     Lips: Pink.  Eyes:     General: Lids are normal. Vision grossly intact. Gaze aligned appropriately.     Extraocular Movements: Extraocular movements intact.     Conjunctiva/sclera: Conjunctivae normal.  Cardiovascular:     Rate and Rhythm: Normal rate and regular rhythm.     Heart sounds: Normal heart sounds, S1 normal and S2 normal.  Pulmonary:     Effort: Pulmonary effort is normal. No respiratory distress.     Breath sounds: Normal breath sounds and air entry.  Musculoskeletal:     Cervical back: Neck supple.     Left foot: Normal range of motion and normal capillary refill. Swelling and tenderness present. No deformity, bunion, Charcot foot, foot drop, prominent metatarsal heads, laceration, bony tenderness or crepitus. Normal pulse.     Comments: Redness, swelling, warmth, and tenderness to palpation over the dorsum of the left forefoot as seen in image below. Cap refill less than 3 distally, sensation and strength intact distally. Minimally warm to touch. +2 bilateral dorsalis pedis pulses.   Skin:    General: Skin is warm and dry.     Capillary Refill: Capillary refill takes less than 2 seconds.     Findings: No rash.  Neurological:     General: No focal deficit present.     Mental Status: She is alert and oriented to person, place, and time. Mental status is at baseline.     GCS: GCS eye subscore is 4. GCS verbal subscore is 5. GCS motor  subscore is 6.     Cranial Nerves: Cranial nerves 2-12 are intact. No dysarthria or facial asymmetry.     Sensory: Sensation is intact.     Motor: Motor function is intact. No weakness, tremor, abnormal muscle tone or pronator drift.     Coordination: Coordination is intact. Romberg sign negative. Coordination normal. Finger-Nose-Finger Test normal.     Gait: Gait is intact.     Comments: Strength and sensation intact to bilateral upper and lower extremities (5/5). Moves all 4 extremities with normal coordination voluntarily. Non-focal neuro exam.   Psychiatric:        Mood and Affect: Mood normal.        Speech: Speech normal.        Behavior: Behavior normal.        Thought Content: Thought content normal.        Judgment: Judgment normal.      UC Treatments / Results  Labs (all labs ordered are listed, but only abnormal results are displayed) Labs Reviewed - No data to display  EKG   Radiology No results found.  Procedures Procedures (including critical care time)  Medications Ordered in UC Medications - No data to display  Initial Impression / Assessment and Plan / UC Course  I have reviewed the triage vital signs and the nursing notes.  Pertinent labs & imaging results that were available during my care of the patient were reviewed by me and considered in my medical decision making (see chart for details).   1. Acute gout due to renal impairment involving left foot Presentation consistent with acute gouty arthritis flare due to renal impairment. Reviewed most recent BMP from 11/01/2023 showing creatinine 1.39 and GFR 36, this is stable. Prednisone 40mg  every day for 5 days ordered. Advised to take with food to avoid stomach upset. Ice, elevation, and compression discussed. Low suspicion for cellulitis, thrombosis.  2. Elevated BP reading in office with history of HTN BP elevated in clinic today likely secondary to grief, stress, and pain due to gout attack. No red  flag signs/symptoms indicating need for referral to ED due to elevated BP.  Discussed lifestyle and dietary changes to lower BP further. Advised to continue taking BP medication as prescribed and follow-up with PCP to discuss management of HTN further.  BP Readings from Last 3 Encounters:  12/04/23 (!) 207/74  11/02/23 (!) 152/62  10/11/23 (!) 160/87    Counseled patient on potential for adverse effects with medications prescribed/recommended today, strict ER and return-to-clinic precautions discussed, patient verbalized understanding.    Final Clinical Impressions(s) / UC Diagnoses   Final diagnoses:  Acute gout due to renal impairment involving left foot  Elevated blood pressure reading in office with diagnosis of hypertension     Discharge Instructions      Prednisone 40mg  once daily for 5 days to treat gout flare.  Ice and elevate the left foot.  Follow-up with primary care doctor as needed. Continue taking BP medications. Lower salt in diet, follow-up with PCP to discuss BP further.    ED Prescriptions     Medication Sig Dispense Auth. Provider   predniSONE (DELTASONE) 20 MG tablet Take 2 tablets (40 mg total) by mouth daily for 5 days. 10 tablet Carlisle Beers, FNP      PDMP not reviewed this encounter.   Carlisle Beers, [place] 12/04/23 1321

## 2023-12-06 DIAGNOSIS — C44722 Squamous cell carcinoma of skin of right lower limb, including hip: Secondary | ICD-10-CM | POA: Diagnosis not present

## 2023-12-12 ENCOUNTER — Ambulatory Visit (INDEPENDENT_AMBULATORY_CARE_PROVIDER_SITE_OTHER): Payer: Medicare Other | Admitting: Internal Medicine

## 2023-12-12 ENCOUNTER — Encounter: Payer: Self-pay | Admitting: Internal Medicine

## 2023-12-12 VITALS — BP 140/80 | HR 52 | Temp 97.4°F | Ht 61.5 in | Wt 141.6 lb

## 2023-12-12 DIAGNOSIS — Z79899 Other long term (current) drug therapy: Secondary | ICD-10-CM | POA: Diagnosis not present

## 2023-12-12 DIAGNOSIS — I10 Essential (primary) hypertension: Secondary | ICD-10-CM | POA: Diagnosis not present

## 2023-12-12 DIAGNOSIS — R809 Proteinuria, unspecified: Secondary | ICD-10-CM

## 2023-12-12 DIAGNOSIS — N1832 Chronic kidney disease, stage 3b: Secondary | ICD-10-CM | POA: Diagnosis not present

## 2023-12-12 DIAGNOSIS — Z634 Disappearance and death of family member: Secondary | ICD-10-CM | POA: Diagnosis not present

## 2023-12-12 NOTE — Progress Notes (Signed)
 Chief Complaint  Patient presents with   Medical Management of Chronic Issues    2 months f/u. Pt reports husband passed away on 12-21-2023. She states she found him dead. And haven't had taken BP reading since husband passed away. Pt reports she had big skin cancer removed and had gout.     HPI: Kerry Walters 78 y.o. come in for Chronic disease management  but  as above  husband  found dead in his chair  time of the [employer] ball game that evening and she had just  been away in other room for about an hours.     No clue that this was going to happen . 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  Seen ed gout attack foot rx pred  on 4 7   bp was quite high     advised to see pcp   Although has seens cards for help with ht.  Renal   US  has seen nephrology  Dr.  Wolfgang Phoenix  and under evaluation   to get further imagining for renal lesion  ? If bcca r shin   tender  trying ot heal path is pending  ROS: See pertinent positives and negatives per HPI. No current cp sob   eating some  grandson is with her at home for now and has been quite helpful .   Past Medical History:  Diagnosis Date   Allergy    Asthma due to environmental allergies    no inhaler   Diabetes mellitus without complication (HCC)    Diverticulosis of colon    GERD (gastroesophageal reflux disease)    History of esophageal dilatation    FOR STRICTURE IN 2005   History of hiatal hernia    History of hypercalcemia    SECONDARY TO HYPERPARATHYROID   History of hyperparathyroidism    PRIMARY--  S/P RIGHT PARATHYROIDECTOMY   History of kidney stones    History of recurrent UTIs    Hyperlipidemia    Hypertension    Hypothyroidism    Incomplete right bundle branch block    Iron deficiency anemia due to chronic blood loss 12/20/21   Nephrolithiasis    RIGHT    Nocturia    PONV (postoperative nausea and  vomiting)    Pre-diabetes    Sciatica of right side     Family History  Problem Relation Age of Onset   Cancer Father        brain   Cancer Son        liver   Diabetes Sister    Schizophrenia Son    Colon cancer Neg Hx    Colon polyps Neg Hx    Esophageal cancer Neg Hx    Stomach cancer Neg Hx    Pancreatic cancer Neg Hx    Rectal cancer Neg Hx     Social History   Socioeconomic History   Marital status: Married    Spouse name: Not on file   Number of children: Not on file   Years of education: Not on file   Highest education level: Not on file  Occupational History   Not on file  Tobacco Use   Smoking status: Never   Smokeless tobacco: Never  Vaping Use   Vaping status: Never Used  Substance and Sexual Activity  Alcohol use: No   Drug use: No   Sexual activity: Not on file  Other Topics Concern   Not on file  Social History Narrative   Married   hh of 2   4 dogs   G2 P2   Water exercise.   Retired    [employer]    great grandchild          Social Drivers of Corporate investment banker Strain: Low Risk  (03/24/2020)   Overall Financial Resource Strain (CARDIA)    Difficulty of Paying Living Expenses: Not hard at all  Food Insecurity: No Food Insecurity (02/08/2023)   Hunger Vital Sign    Worried About Running Out of Food in the Last Year: Never true    Ran Out of Food in the Last Year: Never true  Transportation Needs: No Transportation Needs (03/24/2020)   PRAPARE - Administrator, Civil Service (Medical): No    Lack of Transportation (Non-Medical): No  Physical Activity: Not on file  Stress: Not on file  Social Connections: Not on file    Outpatient Medications Prior to Visit  Medication Sig Dispense Refill   acetaminophen (TYLENOL) 500 MG tablet Take 500-1,000 mg by mouth every 6 (six) hours as needed for moderate pain.     atorvastatin (LIPITOR) 20 MG tablet TAKE 1 TABLET BY MOUTH IN THE  EVENING 100 tablet 2   carvedilol (COREG) 25  MG tablet Take 1 tablet (25 mg total) by mouth 2 (two) times daily with a meal. 180 tablet 3   cetirizine (ZYRTEC) 10 MG tablet Take 10 mg by mouth daily as needed for allergies.     chlorhexidine (HIBICLENS) 4 % external liquid Mix a small amount of the solution and warm water and cleanse the area twice daily until symptoms improve. 118 mL 0   chlorthalidone (HYGROTON) 25 MG tablet Take 1 tablet (25 mg total) by mouth daily. 30 tablet 1   Cholecalciferol (VITAMIN D) 50 MCG (2000 UT) tablet Take 6,000 Units by mouth daily.     conjugated estrogens (PREMARIN) vaginal cream Place vaginally as directed. Use 2-3 times weekly  prnPlace 1 Applicatorful vaginally as directed. Use 2-3 times weekly  prn (Patient taking differently: Place vaginally as directed. 1 Applicatorful vaginally as directed. Use 2-3 times weekly  prn) 42 g 4   Cyanocobalamin (B-12) 3000 MCG CAPS Take 3,000 mcg by mouth daily.     doxazosin (CARDURA) 2 MG tablet Take 1 tablet (2 mg total) by mouth at bedtime. 30 tablet 11   ferrous sulfate 325 (65 FE) MG tablet Take 1 tablet (325 mg total) by mouth every other day. (Patient taking differently: Take 325 mg by mouth daily.) 15 tablet 11   fluticasone (FLONASE) 50 MCG/ACT nasal spray Place 1 spray into both nostrils daily as needed for allergies or rhinitis.     gabapentin (NEURONTIN) 100 MG capsule TAKE 1 TO 3 CAPSULES BY MOUTH AT BEDTIME AS DIRECTED (Patient taking differently: Take 100-200 mg by mouth daily as needed (pain).) 300 capsule 2   latanoprost (XALATAN) 0.005 % ophthalmic solution Place 1 drop into both eyes at bedtime.     levothyroxine (SYNTHROID) 75 MCG tablet TAKE 1 TABLET BY MOUTH DAILY 100 tablet 2   magnesium oxide (MAG-OX) 400 (240 Mg) MG tablet Take 800 mg by mouth daily.     metFORMIN (GLUCOPHAGE-XR) 500 MG 24 hr tablet TAKE 3 TABLETS BY MOUTH DAILY 300 tablet 2   OVER THE COUNTER  MEDICATION Take 1 tablet by mouth daily. Total Beets supplements     pantoprazole  (PROTONIX) 40 MG tablet Take 1 tablet (40 mg total) by mouth 2 (two) times daily before a meal. 180 tablet 2   spironolactone (ALDACTONE) 25 MG tablet TAKE 1 TABLET BY MOUTH DAILY 90 tablet 3   Turmeric 500 MG CAPS Take 1,000 mg by mouth daily.     valsartan (DIOVAN) 320 MG tablet Take 1 tablet (320 mg total) by mouth daily. 90 tablet 3   Facility-Administered Medications Prior to Visit  Medication Dose Route Frequency Provider Last Rate Last Admin   betamethasone acetate-betamethasone sodium phosphate (CELESTONE) injection 3 mg  3 mg Intramuscular Once Gala Lewandowsky M, DPM       betamethasone acetate-betamethasone sodium phosphate (CELESTONE) injection 3 mg  3 mg Intramuscular Once Evans, Brent M, DPM         EXAM:  BP (!) 140/80 (BP Location: Right Arm, Patient Position: Sitting, Cuff Size: Normal)   Pulse (!) 52   Temp (!) 97.4 F (36.3 C) (Oral)   Ht 5' 1.5" (1.562 m)   Wt 141 lb 9.6 oz (64.2 kg)   SpO2 98%   BMI 26.32 kg/m   Body mass index is 26.32 kg/m.  GENERAL: vitals reviewed and listed above, alert, oriented, appears well hydrated and in no acute distress  sad and bereaved  but appropriate and intact  HEENT: atraumatic, conjunctiva  clear, no obvious abnormalities on inspection of external nose and ears  NECK: no obvious masses on inspection palpation  LUNGS: clear to auscultation bilaterally, no wheezes, rales or rhonchi, good air movement CV: HRRR, no clubbing cyanosis or  peripheral edema nl cap refill  2/6 sem upper chest  no g  MS: moves all extremities without noticeable focal  abnormality no cce bandaged  area right  pretibial area  PSYCH: cognition intact  sad  but calm  Lab Results  Component Value Date   WBC 8.6 11/01/2023   HGB 11.8 (L) 11/01/2023   HCT 35.3 (L) 11/01/2023   PLT 258.0 11/01/2023   GLUCOSE 123 (H) 11/01/2023   CHOL 143 02/23/2023   TRIG 112.0 02/23/2023   HDL 40.00 02/23/2023   LDLDIRECT 101.0 03/04/2019   LDLCALC 80 02/23/2023   ALT  14 11/01/2023   AST 20 11/01/2023   NA 141 11/01/2023   K 4.1 11/01/2023   CL 104 11/01/2023   CREATININE 1.39 (H) 11/01/2023   BUN 34 (H) 11/01/2023   CO2 31 11/01/2023   TSH 2.97 11/01/2023   INR 1.12 11/19/2009   HGBA1C 5.9 (A) 10/11/2023   MICROALBUR 149.6 (H) 10/11/2023   BP Readings from Last 3 Encounters:  12/12/23 (!) 140/80  12/04/23 (!) 207/74  11/02/23 (!) 152/62    ASSESSMENT AND PLAN:  Discussed the following assessment and plan:  Uncontrolled hypertension - improved today  has fu card  Recent bereavement  Death of husband  Stage 3b chronic kidney disease (HCC) - under eval for renal lesion on [place]  followint and fu  Medication management  Proteinuria, unspecified type  HT a bit better  Current health revolves around  recent  sudden death of husband.  . Counsel  bereavement  SD  of spouse  ..  has support  church and family    Expectant management.  No big decisions but  be gentle to herself. 45 minutes   review time counsel and plan  Can reach out for any think we can do to  help . In interim  -Patient advised to return or notify health care team  if  new concerns arise.  Patient Instructions  Fu 3 mos  or as indicatee earlier  as discussed    Kathlean Cinco K. Reve Crocket M.D.

## 2023-12-12 NOTE — Patient Instructions (Signed)
 Fu 3 mos  or as indicatee earlier  as discussed

## 2023-12-13 DIAGNOSIS — M51361 Other intervertebral disc degeneration, lumbar region with lower extremity pain only: Secondary | ICD-10-CM | POA: Diagnosis not present

## 2023-12-13 DIAGNOSIS — M9904 Segmental and somatic dysfunction of sacral region: Secondary | ICD-10-CM | POA: Diagnosis not present

## 2023-12-13 DIAGNOSIS — M9905 Segmental and somatic dysfunction of pelvic region: Secondary | ICD-10-CM | POA: Diagnosis not present

## 2023-12-13 DIAGNOSIS — M9903 Segmental and somatic dysfunction of lumbar region: Secondary | ICD-10-CM | POA: Diagnosis not present

## 2023-12-17 ENCOUNTER — Encounter (INDEPENDENT_AMBULATORY_CARE_PROVIDER_SITE_OTHER): Payer: Self-pay | Admitting: Cardiology

## 2023-12-17 DIAGNOSIS — G4733 Obstructive sleep apnea (adult) (pediatric): Secondary | ICD-10-CM | POA: Diagnosis not present

## 2023-12-18 ENCOUNTER — Ambulatory Visit: Attending: Cardiology

## 2023-12-18 DIAGNOSIS — R0683 Snoring: Secondary | ICD-10-CM

## 2023-12-18 NOTE — Procedures (Signed)
     SLEEP STUDY REPORT Patient Information Study Date: 12/17/2023 Patient Name: Kerry Walters Patient ID: [id number] Birth Date: October 22, 1945 Age: 78 Gender: Female BMI: 27.5 (W=145 lb, H=5' 1'') Referring Physician: Sheryle Donning, MD  TEST DESCRIPTION: Home sleep apnea testing was completed using the WatchPat, a Type 1 device, utilizing  peripheral arterial tonometry (PAT), chest movement, actigraphy, pulse oximetry, pulse rate, body position and snore.  AHI was calculated with apnea and hypopnea using valid sleep time as the denominator. RDI includes apneas,  hypopneas, and RERAs. The data acquired and the scoring of sleep and all associated events were performed in  accordance with the recommended standards and specifications as outlined in the AASM Manual for the Scoring of  Sleep and Associated Events 2.2.0 (2015).   FINDINGS:   1. Mild Obstructive Sleep Apnea with AHI 6.5/hr with all events occurring during supine sleep.  2. No Central Sleep Apnea with pAHIc 0.3/hr.   3. Oxygen desaturations as low as 88%.   4. Moderate snoring was present. O2 sats were < 88% for 0 min.   5. Total sleep time was 7 hrs and 59 min.   6. 14.4% of total sleep time was spent in REM sleep.   7. Normal sleep onset latency at 22 min.   8. Prolonged REM sleep onset latency at 112 min.   9. Total awakenings were 13.  10. Arrhythmia detection: None  DIAGNOSIS: Mild Obstructive Sleep Apnea (G47.33)  RECOMMENDATIONS: 1. Clinical correlation of these findings is necessary. The decision to treat obstructive sleep apnea (OSA) is usually  based on the presence of apnea symptoms or the presence of associated medical conditions such as Hypertension,  Congestive Heart Failure, Atrial Fibrillation or Obesity. The most common symptoms of OSA are snoring, gasping for  breath while sleeping, daytime sleepiness and fatigue.  2. Initiating apnea therapy is recommended given the presence of symptoms and/or  associated conditions.  Recommend proceeding with one of the following:  a. Auto-CPAP therapy with a pressure range of 5-20cm H2O.  b. An oral appliance (OA) that can be obtained from certain dentists with expertise in sleep medicine. These are  primarily of use in non-obese patients with mild and moderate disease.  c. An ENT consultation which may be useful to look for specific causes of obstruction and possible treatment  options.  d. If patient is intolerant to PAP therapy, consider referral to ENT for evaluation for hypoglossal nerve stimulator.  3. Close follow-up is necessary to ensure success with CPAP or oral appliance therapy for maximum benefit . 4. A follow-up oximetry study on CPAP is recommended to assess the adequacy of therapy and determine the need  for supplemental oxygen or the potential need for Bi-level therapy. An arterial blood gas to determine the adequacy of  baseline ventilation and oxygenation should also be considered. 5. Healthy sleep recommendations include: adequate nightly sleep (normal 7-9 hrs/night), avoidance of caffeine after  noon and alcohol near bedtime, and maintaining a sleep environment that is cool, dark and quiet. 6. Weight loss for overweight patients is recommended. Even modest amounts of weight loss can significantly  improve the severity of sleep apnea. 7. Snoring recommendations include: weight loss where appropriate, side sleeping, and avoidance of alcohol before  bed. 8. Operation of motor vehicle should be avoided when sleepy.  Signature: Gaylyn Keas, MD; [facility]; Diplomat, American Board of Sleep  Medicine Electronically Signed: 12/18/2023 3:50:18 P

## 2023-12-19 ENCOUNTER — Telehealth: Payer: Self-pay | Admitting: *Deleted

## 2023-12-19 NOTE — Telephone Encounter (Signed)
 Called results no voicemail set up.

## 2023-12-19 NOTE — Telephone Encounter (Signed)
-----   Message from Gaylyn Keas sent at 12/18/2023  3:51 PM EDT ----- Patient has very mild OSA - set up OV to discuss treatment options.

## 2023-12-20 ENCOUNTER — Encounter: Payer: Self-pay | Admitting: *Deleted

## 2023-12-20 NOTE — Telephone Encounter (Signed)
 The patient has been notified of the result and verbalized understanding.  All questions (if any) were answered. Gaylene Kays, CMA 12/20/2023 1:47 PM    Patient declined to set up OV to discuss treatment options at this time.        `      This encounter was created in error - please disregard.

## 2023-12-20 NOTE — Telephone Encounter (Signed)
 The patient has been notified of the result and verbalized understanding.  All questions (if any) were answered. Gaylene Kays, CMA 12/20/2023 1:17 PM     Patient declined to set up OV to discuss treatment options at this time.

## 2023-12-20 NOTE — Telephone Encounter (Signed)
-----   Message from Gaylyn Keas sent at 12/18/2023  3:51 PM EDT ----- Patient has very mild OSA - set up OV to discuss treatment options.

## 2023-12-21 ENCOUNTER — Ambulatory Visit (HOSPITAL_BASED_OUTPATIENT_CLINIC_OR_DEPARTMENT_OTHER): Admitting: Family

## 2023-12-21 VITALS — BP 168/90 | HR 55 | Ht 61.0 in | Wt 145.8 lb

## 2023-12-21 DIAGNOSIS — G4733 Obstructive sleep apnea (adult) (pediatric): Secondary | ICD-10-CM | POA: Diagnosis not present

## 2023-12-21 DIAGNOSIS — I10 Essential (primary) hypertension: Secondary | ICD-10-CM

## 2023-12-21 DIAGNOSIS — E1165 Type 2 diabetes mellitus with hyperglycemia: Secondary | ICD-10-CM | POA: Diagnosis not present

## 2023-12-21 DIAGNOSIS — I340 Nonrheumatic mitral (valve) insufficiency: Secondary | ICD-10-CM

## 2023-12-21 DIAGNOSIS — E782 Mixed hyperlipidemia: Secondary | ICD-10-CM

## 2023-12-21 MED ORDER — DOXAZOSIN MESYLATE 2 MG PO TABS
2.0000 mg | ORAL_TABLET | Freq: Every day | ORAL | 3 refills | Status: DC
Start: 2023-12-21 — End: 2024-01-19

## 2023-12-21 MED ORDER — DOXAZOSIN MESYLATE 1 MG PO TABS: 1.0000 mg | ORAL_TABLET | Freq: Every day | ORAL | 3 refills | Status: DC
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
## 2023-12-21 NOTE — Patient Instructions (Addendum)
 Medication Instructions:  Your physician has recommended you make the following change in your medication:  Increase Doxazosin  3mg  daily at bedtime    Labwork: Your physician recommends that you return for lab work today- metanephrines    Testing/Procedures: Your physician has requested that you have a renal artery duplex. During this test, an ultrasound is used to evaluate blood flow to the kidneys. Allow one hour for this exam. Do not eat after midnight the day before and avoid carbonated beverages. Take your medications as you usually do    Follow-Up: Please follow up END OF MAY in [facility] with Dr. Theodis Fiscal, Neomi Banks, NP or Donivan Furry PharmD    Special Instructions:  [facility] [place] [address] [place],  [place]  [address] Main: [phone]

## 2023-12-25 ENCOUNTER — Encounter (HOSPITAL_BASED_OUTPATIENT_CLINIC_OR_DEPARTMENT_OTHER): Payer: Self-pay | Admitting: Family

## 2023-12-25 DIAGNOSIS — M9903 Segmental and somatic dysfunction of lumbar region: Secondary | ICD-10-CM | POA: Diagnosis not present

## 2023-12-25 DIAGNOSIS — M51361 Other intervertebral disc degeneration, lumbar region with lower extremity pain only: Secondary | ICD-10-CM | POA: Diagnosis not present

## 2023-12-25 DIAGNOSIS — M9905 Segmental and somatic dysfunction of pelvic region: Secondary | ICD-10-CM | POA: Diagnosis not present

## 2023-12-25 DIAGNOSIS — M9904 Segmental and somatic dysfunction of sacral region: Secondary | ICD-10-CM | POA: Diagnosis not present

## 2024-01-03 DIAGNOSIS — M9904 Segmental and somatic dysfunction of sacral region: Secondary | ICD-10-CM | POA: Diagnosis not present

## 2024-01-03 DIAGNOSIS — M51361 Other intervertebral disc degeneration, lumbar region with lower extremity pain only: Secondary | ICD-10-CM | POA: Diagnosis not present

## 2024-01-03 DIAGNOSIS — M9903 Segmental and somatic dysfunction of lumbar region: Secondary | ICD-10-CM | POA: Diagnosis not present

## 2024-01-03 DIAGNOSIS — M9905 Segmental and somatic dysfunction of pelvic region: Secondary | ICD-10-CM | POA: Diagnosis not present

## 2024-01-04 ENCOUNTER — Encounter: Payer: Self-pay | Admitting: Internal Medicine

## 2024-01-04 ENCOUNTER — Ambulatory Visit (INDEPENDENT_AMBULATORY_CARE_PROVIDER_SITE_OTHER): Admitting: Internal Medicine

## 2024-01-04 VITALS — BP 150/80 | HR 57 | Temp 97.9°F | Ht 61.0 in | Wt 144.6 lb

## 2024-01-04 DIAGNOSIS — E039 Hypothyroidism, unspecified: Secondary | ICD-10-CM | POA: Diagnosis not present

## 2024-01-04 DIAGNOSIS — M1000 Idiopathic gout, unspecified site: Secondary | ICD-10-CM

## 2024-01-04 DIAGNOSIS — N1832 Chronic kidney disease, stage 3b: Secondary | ICD-10-CM | POA: Diagnosis not present

## 2024-01-04 DIAGNOSIS — Z79899 Other long term (current) drug therapy: Secondary | ICD-10-CM | POA: Diagnosis not present

## 2024-01-04 DIAGNOSIS — E1165 Type 2 diabetes mellitus with hyperglycemia: Secondary | ICD-10-CM | POA: Diagnosis not present

## 2024-01-04 DIAGNOSIS — I10 Essential (primary) hypertension: Secondary | ICD-10-CM

## 2024-01-04 MED ORDER — PREDNISONE 50 MG PO TABS: 50.0000 mg | ORAL_TABLET | Freq: Every day | ORAL | 0 refills | Status: DC

## 2024-01-04 NOTE — Progress Notes (Signed)
 Chief Complaint  Patient presents with   Gout    Pt c/o pain, swelling on big toe on R side. Pt states sx is coming from gout flares up. Going on for 3 days. Hurts to walk and putting shoes on. Pt states she needs medication.     HPI: Kerry Walters 78 y.o. come in for 3-4 days of acute  right mtp joint ain and  redness  "gout" recurrence .  No meds topical but very painful. Physically doing ok but has fu cards eval renal and lab eval  for resistant ht .  Diet is not a trigger as far as she can tell.  States labs ok in past  in  regard to gout  last rx with steroid about a month ago and bg up but helped  Dealing with estate  after husbands recent death.   ROS: See pertinent positives and negatives per HPI. No fever injury  meds otherwise for gout   Past Medical History:  Diagnosis Date   Allergy    Asthma due to environmental allergies    no inhaler   Diabetes mellitus without complication (HCC)    Diverticulosis of colon    GERD (gastroesophageal reflux disease)    History of esophageal dilatation    FOR STRICTURE IN 2005   History of hiatal hernia    History of hypercalcemia    SECONDARY TO HYPERPARATHYROID   History of hyperparathyroidism    PRIMARY--  S/P RIGHT PARATHYROIDECTOMY   History of kidney stones    History of recurrent UTIs    Hyperlipidemia    Hypertension    Hypothyroidism    Incomplete right bundle branch block    Iron deficiency anemia due to chronic blood loss 12/01/2021   Nephrolithiasis    RIGHT    Nocturia    PONV (postoperative nausea and vomiting)    Pre-diabetes    Sciatica of right side     Family History  Problem Relation Age of Onset   Cancer Father        brain   Cancer Son        liver   Diabetes Sister    Schizophrenia Son    Colon cancer Neg Hx    Colon polyps Neg Hx    Esophageal cancer Neg Hx    Stomach cancer Neg Hx    Pancreatic cancer Neg Hx    Rectal cancer Neg Hx     Social History   Socioeconomic History    Marital status: Married    Spouse name: Not on file   Number of children: Not on file   Years of education: Not on file   Highest education level: Not on file  Occupational History   Not on file  Tobacco Use   Smoking status: Never   Smokeless tobacco: Never  Vaping Use   Vaping status: Never Used  Substance and Sexual Activity   Alcohol use: No   Drug use: No   Sexual activity: Not on file  Other Topics Concern   Not on file  Social History Narrative   Married   hh of 2   4 dogs   G2 P2   Water exercise.   Retired    [employer]    great grandchild          Social Drivers of Corporate investment banker Strain: Low Risk  (03/24/2020)   Overall Financial Resource Strain (CARDIA)    Difficulty of Paying Living Expenses:  Not hard at all  Food Insecurity: No Food Insecurity (12/21/2023)   Hunger Vital Sign    Worried About Running Out of Food in the Last Year: Never true    Ran Out of Food in the Last Year: Never true  Transportation Needs: No Transportation Needs (12/21/2023)   PRAPARE - Administrator, Civil Service (Medical): No    Lack of Transportation (Non-Medical): No  Physical Activity: Insufficiently Active (12/21/2023)   Exercise Vital Sign    Days of Exercise per Week: 2 days    Minutes of Exercise per Session: 30 min  Stress: Not on file  Social Connections: Not on file    Outpatient Medications Prior to Visit  Medication Sig Dispense Refill   acetaminophen  (TYLENOL ) 500 MG tablet Take 500-1,000 mg by mouth every 6 (six) hours as needed for moderate pain.     atorvastatin  (LIPITOR) 20 MG tablet TAKE 1 TABLET BY MOUTH IN THE  EVENING 100 tablet 2   carvedilol  (COREG ) 25 MG tablet Take 1 tablet (25 mg total) by mouth 2 (two) times daily with a meal. 180 tablet 3   cetirizine (ZYRTEC) 10 MG tablet Take 10 mg by mouth daily as needed for allergies.     Cholecalciferol (VITAMIN D) 50 MCG (2000 UT) tablet Take 6,000 Units by mouth daily.     conjugated  estrogens  (PREMARIN ) vaginal cream Place vaginally as directed. Use 2-3 times weekly  prnPlace 1 Applicatorful vaginally as directed. Use 2-3 times weekly  prn (Patient taking differently: Place vaginally as directed. 1 Applicatorful vaginally as directed. Use 2-3 times weekly  prn) 42 g 4   Cyanocobalamin  (B-12) 3000 MCG CAPS Take 3,000 mcg by mouth daily.     doxazosin  (CARDURA ) 1 MG tablet Take 1 tablet (1 mg total) by mouth at bedtime. TAKE ONE TABLET (2MG ) WITH ONE TABLET (1MG ) FOR A TOTAL DOSE OF 3MG  90 tablet 3   doxazosin  (CARDURA ) 2 MG tablet Take 1 tablet (2 mg total) by mouth at bedtime. 90 tablet 3   ferrous sulfate  325 (65 FE) MG tablet Take 1 tablet (325 mg total) by mouth every other day. (Patient taking differently: Take 325 mg by mouth daily.) 15 tablet 11   fluticasone (FLONASE) 50 MCG/ACT nasal spray Place 1 spray into both nostrils daily as needed for allergies or rhinitis.     latanoprost (XALATAN) 0.005 % ophthalmic solution Place 1 drop into both eyes at bedtime.     levothyroxine  (SYNTHROID ) 75 MCG tablet TAKE 1 TABLET BY MOUTH DAILY 100 tablet 2   magnesium oxide (MAG-OX) 400 (240 Mg) MG tablet Take 800 mg by mouth daily.     metFORMIN  (GLUCOPHAGE -XR) 500 MG 24 hr tablet TAKE 3 TABLETS BY MOUTH DAILY 300 tablet 2   OVER THE COUNTER MEDICATION Take 1 tablet by mouth daily. Total Beets supplements     pantoprazole  (PROTONIX ) 40 MG tablet Take 1 tablet (40 mg total) by mouth 2 (two) times daily before a meal. 180 tablet 2   spironolactone  (ALDACTONE ) 25 MG tablet TAKE 1 TABLET BY MOUTH DAILY 90 tablet 3   Turmeric 500 MG CAPS Take 1,000 mg by mouth daily.     valsartan  (DIOVAN ) 320 MG tablet Take 1 tablet (320 mg total) by mouth daily. 90 tablet 3   Facility-Administered Medications Prior to Visit  Medication Dose Route Frequency Provider Last Rate Last Admin   betamethasone  acetate-betamethasone  sodium phosphate  (CELESTONE ) injection 3 mg  3 mg Intramuscular Once Evans, Brent  M, DPM       betamethasone  acetate-betamethasone  sodium phosphate  (CELESTONE ) injection 3 mg  3 mg Intramuscular Once Evans, Brent M, DPM         EXAM:  BP (!) 150/80 (BP Location: Left Arm, Patient Position: Sitting, Cuff Size: Large)   Pulse (!) 57   Temp 97.9 F (36.6 C) (Oral)   Ht 5\' 1"  (1.549 m)   Wt 144 lb 9.6 oz (65.6 kg)   SpO2 98%   BMI 27.32 kg/m   Body mass index is 27.32 kg/m.  GENERAL: vitals reviewed and listed above, alert, oriented, appears well hydrated and in no acute distress HEENT: atraumatic, conjunctiva  clear, no obvious abnormalities on inspection of external nose and ears  NECK: no obvious masses on inspection palpation  MS: moves all extremities ight mtp joint swollen  pink red  no streaking   no bruising  PSYCH: pleasant and cooperative, no obvious depression or anxiety Lab Results  Component Value Date   WBC 8.6 11/01/2023   HGB 11.8 (L) 11/01/2023   HCT 35.3 (L) 11/01/2023   PLT 258.0 11/01/2023   GLUCOSE 123 (H) 11/01/2023   CHOL 143 02/23/2023   TRIG 112.0 02/23/2023   HDL 40.00 02/23/2023   LDLDIRECT 101.0 03/04/2019   LDLCALC 80 02/23/2023   ALT 14 11/01/2023   AST 20 11/01/2023   NA 141 11/01/2023   K 4.1 11/01/2023   CL 104 11/01/2023   CREATININE 1.39 (H) 11/01/2023   BUN 34 (H) 11/01/2023   CO2 31 11/01/2023   TSH 2.97 11/01/2023   INR 1.12 11/19/2009   HGBA1C 5.9 (A) 10/11/2023   MICROALBUR 149.6 (H) 10/11/2023   BP Readings from Last 3 Encounters:  01/04/24 (!) 150/80  12/21/23 (!) 168/90  12/12/23 (!) 140/80  Uric acid Lab Results  Component Value Date   LABURIC 8.4 (H) 05/26/2023     ASSESSMENT AND PLAN:  Discussed the following assessment and plan:  Acute idiopathic gout, unspecified site - Plan: Uric acid, CBC with Differential/Platelet, Hepatic function panel, Hemoglobin A1c, Basic metabolic panel with GFR, Lipid panel  Medication management - Plan: Uric acid, CBC with Differential/Platelet, Hepatic  function panel, Hemoglobin A1c, Basic metabolic panel with GFR, Lipid panel  Stage 3b chronic kidney disease (HCC) - Plan: Uric acid, CBC with Differential/Platelet, Hepatic function panel, Hemoglobin A1c, Basic metabolic panel with GFR, Lipid panel  Hypothyroidism, unspecified type - Plan: Uric acid, CBC with Differential/Platelet, Hepatic function panel, Hemoglobin A1c, Basic metabolic panel with GFR, Lipid panel  Essential hypertension - Plan: Uric acid, CBC with Differential/Platelet, Hepatic function panel, Hemoglobin A1c, Basic metabolic panel with GFR, Lipid panel  Type 2 diabetes mellitus with hyperglycemia, without long-term current use of insulin (HCC) - Plan: Uric acid, CBC with Differential/Platelet, Hepatic function panel, Hemoglobin A1c, Basic metabolic panel with GFR, Lipid panel  Hx of same recurrent   advise updated lab after resolved   consideration of  controller med  should not take nsaid cause of renal function etc  Pred dose today  consider adding on low dose colchicine  but there are drug iA possible  -Patient advised to return or notify health care team  if  new concerns arise.  Patient Instructions  I agree this is gout  .  May add colchicine  at lower dose to prevention.but want to chek out drug interactions first ..    In future we want to recheck uric acid level to see if prevention medication will help . Lab before next  visit   with me    Laronica Bhagat K. Estefani Bateson M.D.

## 2024-01-04 NOTE — Patient Instructions (Addendum)
 I agree this is gout  .  May add colchicine  at lower dose to prevention.but want to chek out drug interactions first ..    In future we want to recheck uric acid level to see if prevention medication will help . Lab before next visit   with me

## 2024-01-10 ENCOUNTER — Other Ambulatory Visit: Payer: Self-pay | Admitting: Internal Medicine

## 2024-01-10 MED ORDER — LEVOTHYROXINE SODIUM 75 MCG PO TABS: 75.0000 ug | ORAL_TABLET | Freq: Every day | ORAL | 2 refills | Status: AC

## 2024-01-10 NOTE — Telephone Encounter (Signed)
 Copied from CRM [id number]. Topic: Clinical - Medication Refill >> Jan 10, 2024  9:46 AM Deaijah H wrote: Medication:  levothyroxine  (SYNTHROID ) 75 MCG tablet   Has the patient contacted their pharmacy? Yes (Agent: If no, request that the patient contact the pharmacy for the refill. If patient does not wish to contact the pharmacy document the reason why and proceed with request.) (Agent: If yes, when and what did the pharmacy advise?) That they sent it over to PCP but no records  This is the patient's preferred pharmacy:  [facility] [place], [place] - [address] [place] [address] Phone: [phone] Fax: [phone] Is this the correct pharmacy for this prescription? Yes If no, delete pharmacy and type the correct one.   Has the prescription been filled recently? No  Is the patient out of the medication? No, a week left  Has the patient been seen for an appointment in the last year OR does the patient have an upcoming appointment? Yes  Can we respond through MyChart? No  Agent: Please be advised that Rx refills may take up to 3 business days. We ask that you follow-up with your pharmacy.

## 2024-01-11 DIAGNOSIS — E210 Primary hyperparathyroidism: Secondary | ICD-10-CM | POA: Diagnosis not present

## 2024-01-11 DIAGNOSIS — E1122 Type 2 diabetes mellitus with diabetic chronic kidney disease: Secondary | ICD-10-CM | POA: Diagnosis not present

## 2024-01-11 DIAGNOSIS — R809 Proteinuria, unspecified: Secondary | ICD-10-CM | POA: Diagnosis not present

## 2024-01-11 DIAGNOSIS — N1832 Chronic kidney disease, stage 3b: Secondary | ICD-10-CM | POA: Diagnosis not present

## 2024-01-15 DIAGNOSIS — M9903 Segmental and somatic dysfunction of lumbar region: Secondary | ICD-10-CM | POA: Diagnosis not present

## 2024-01-15 DIAGNOSIS — M9905 Segmental and somatic dysfunction of pelvic region: Secondary | ICD-10-CM | POA: Diagnosis not present

## 2024-01-15 DIAGNOSIS — M51361 Other intervertebral disc degeneration, lumbar region with lower extremity pain only: Secondary | ICD-10-CM | POA: Diagnosis not present

## 2024-01-15 DIAGNOSIS — M9904 Segmental and somatic dysfunction of sacral region: Secondary | ICD-10-CM | POA: Diagnosis not present

## 2024-01-19 ENCOUNTER — Encounter (HOSPITAL_BASED_OUTPATIENT_CLINIC_OR_DEPARTMENT_OTHER)

## 2024-01-19 ENCOUNTER — Ambulatory Visit (HOSPITAL_BASED_OUTPATIENT_CLINIC_OR_DEPARTMENT_OTHER): Admitting: Family

## 2024-01-19 ENCOUNTER — Encounter (HOSPITAL_BASED_OUTPATIENT_CLINIC_OR_DEPARTMENT_OTHER): Payer: Self-pay | Admitting: Family

## 2024-01-19 ENCOUNTER — Ambulatory Visit (HOSPITAL_BASED_OUTPATIENT_CLINIC_OR_DEPARTMENT_OTHER)

## 2024-01-19 VITALS — BP 180/80 | HR 66 | Ht 61.0 in | Wt 144.8 lb

## 2024-01-19 DIAGNOSIS — G4733 Obstructive sleep apnea (adult) (pediatric): Secondary | ICD-10-CM

## 2024-01-19 DIAGNOSIS — I10 Essential (primary) hypertension: Secondary | ICD-10-CM

## 2024-01-19 DIAGNOSIS — I340 Nonrheumatic mitral (valve) insufficiency: Secondary | ICD-10-CM | POA: Diagnosis not present

## 2024-01-19 MED ORDER — DOXAZOSIN MESYLATE 4 MG PO TABS
4.0000 mg | ORAL_TABLET | Freq: Every day | ORAL | 1 refills | Status: DC
Start: 2024-01-19 — End: 2024-03-19

## 2024-01-19 NOTE — Progress Notes (Unsigned)
 [facility] Assessment:    Date:  01/22/2024   ID:  Kerry Walters, DOB October 31, 1945, MRN [id number]  PCP:  Kerry Capra, MD  Cardiologist:  Kerry Donning, MD  Nephrologist:  Referring MD: Kerry Capra, MD   CC: Hypertension  History of Present Illness:    Kerry Walters is a 78 y.o. female with a hx of resistant HTN, CKDIIIb, DM2 here to establish care in the [facility].   Echo 06/2023 LVEF 60-65%, indeterminate DD, normal RV, mild to moderate MR, RAP 3.  Labs 06/2023 Renetta to video 0.1, aldosterone 2 not consistent with hyperaldosteronism.  Established with Dr. Veryl Walters 11/02/23. Had upcoming visit 11/04/23 to establish with nephrology. Multiple medication intolerances/sensitivities noted, Doxazosin  2mg  at bedtime initiated. Recommended to continue carvedilol  25 mg BID, chlorthalidone  25 mg daily, spironolactone  25 mg daily, valsartan  320 mg daily. [facility] home sleep study with very mild sleep apnea AHI 6.5/hr all during supine sleep - declined visit with Dr. Micael Walters.   Established with [facility] 12/21/23. Unfortunately her husband unexpectedly passed away 01-01-2024. Her doxazosin  was increased to 3mg  at bedtime. Metanephrines unremarkable. Renal duplex performed earlier today, not yet available.   Presents today for follow up. Since last seen had gout flare, discussed would avoid resuming Chlorthalidone  (previously prescribed). She has not been checking BP routinely at home. Gout flare alleviated with prednisone  but now with bilateral pedal edema which worsens throughout the day. She has not yet taken medication today as she had not yet eaten. Reports no shortness of breath nor dyspnea on exertion. Reports no chest pain, pressure, or tightness. No edema, orthopnea, PND. Reports no palpitations.    Previous antihypertensives: Valsartan  hydrochlorothiazide  Clonidine  Chlorthalidone  -  no reported side effects but hesitant to  resume due to gout   Past Medical History:  Diagnosis Date   Allergy    Asthma due to environmental allergies    no inhaler   Diabetes mellitus without complication (HCC)    Diverticulosis of colon    GERD (gastroesophageal reflux disease)    History of esophageal dilatation    FOR STRICTURE IN 2005   History of hiatal hernia    History of hypercalcemia    SECONDARY TO HYPERPARATHYROID   History of hyperparathyroidism    PRIMARY--  S/P RIGHT PARATHYROIDECTOMY   History of kidney stones    History of recurrent UTIs    Hyperlipidemia    Hypertension    Hypothyroidism    Incomplete right bundle branch block    Iron deficiency anemia due to chronic blood loss 2021/12/31   Nephrolithiasis    RIGHT    Nocturia    PONV (postoperative nausea and vomiting)    Pre-diabetes    Sciatica of right side     Past Surgical History:  Procedure Laterality Date   APPENDECTOMY  1974   COLONOSCOPY WITH PROPOFOL  N/A 01/12/2023   Procedure: COLONOSCOPY WITH PROPOFOL ;  Surgeon: Kerry Walters., MD;  Location: [facility];  Service: Gastroenterology;  Laterality: N/A;   CYSTOSCOPY WITH URETEROSCOPY AND STENT PLACEMENT Right 11/11/2014   Procedure: CYSTOSCOPY WITH URETEROSCOPY AND STENT PLACEMENT;  Surgeon: Kerry Banker, MD;  Location: [facility];  Service: Urology;  Laterality: Right;   ENDOSCOPIC MUCOSAL RESECTION N/A 01/12/2023   Procedure: ENDOSCOPIC MUCOSAL RESECTION;  Surgeon: Kerry Campi Albino Alu., MD;  Location: [facility];  Service: Gastroenterology;  Laterality: N/A;   ESOPHAGOGASTRODUODENOSCOPY (EGD) WITH PROPOFOL  N/A 01/12/2023   Procedure: ESOPHAGOGASTRODUODENOSCOPY (EGD) WITH PROPOFOL ;  Surgeon: Kerry Walters., MD;  Location: [facility];  Service: Gastroenterology;  Laterality: N/A;   EXCISION URETHRAL CARUNCLE  1981   Open procedure   HEMOSTASIS CLIP PLACEMENT  01/12/2023   Procedure: HEMOSTASIS CLIP PLACEMENT;  Surgeon: Kerry Walters., MD;  Location: [facility];  Service: Gastroenterology;;   HEMOSTASIS CONTROL  01/12/2023   Procedure: HEMOSTASIS CONTROL;  Surgeon: Kerry Walters., MD;  Location: [facility];  Service: Gastroenterology;;   KNEE ARTHROSCOPY Left april 2010   POLYPECTOMY  01/12/2023   Procedure: POLYPECTOMY;  Surgeon: Kerry Campi Albino Alu., MD;  Location: [facility];  Service: Gastroenterology;;  gastric and colon   REMOVAL RIP  1973   First Rib on Right side-- for impingement   RIGHT INFERIOR PARATHYROIDECTOMY, MINIMALLY INVASIVE  08-14-2008   SUBMUCOSAL LIFTING INJECTION  01/12/2023   Procedure: SUBMUCOSAL LIFTING INJECTION;  Surgeon: Kerry Walters., MD;  Location: [facility];  Service: Gastroenterology;;   TONSILLECTOMY  as child   TOTAL KNEE ARTHROPLASTY Left 11-17-2009   TUBAL LIGATION  1973   VAGINAL HYSTERECTOMY  1995   w/ Bilateral Salpingoophorectomy    Current Medications: Current Meds  Medication Sig   acetaminophen  (TYLENOL ) 500 MG tablet Take 500-1,000 mg by mouth every 6 (six) hours as needed for moderate pain.   atorvastatin  (LIPITOR) 20 MG tablet TAKE 1 TABLET BY MOUTH IN THE  EVENING   carvedilol  (COREG ) 25 MG tablet Take 1 tablet (25 mg total) by mouth 2 (two) times daily with a meal.   cetirizine (ZYRTEC) 10 MG tablet Take 10 mg by mouth daily as needed for allergies.   Cholecalciferol (VITAMIN D) 50 MCG (2000 UT) tablet Take 6,000 Units by mouth daily.   conjugated estrogens  (PREMARIN ) vaginal cream Place vaginally as directed. Use 2-3 times weekly  prnPlace 1 Applicatorful vaginally as directed. Use 2-3 times weekly  prn (Patient taking differently: Place vaginally as directed. 1 Applicatorful vaginally as directed. Use 2-3 times weekly  prn)   Cyanocobalamin  (B-12) 3000 MCG CAPS Take 3,000 mcg by mouth daily.   doxazosin  (CARDURA ) 4 MG tablet Take 1 tablet (4 mg total) by mouth at bedtime.   ferrous sulfate  325 (65 FE) MG tablet Take 1 tablet (325 mg  total) by mouth every other day. (Patient taking differently: Take 325 mg by mouth daily.)   fluticasone (FLONASE) 50 MCG/ACT nasal spray Place 1 spray into both nostrils daily as needed for allergies or rhinitis.   latanoprost (XALATAN) 0.005 % ophthalmic solution Place 1 drop into both eyes at bedtime.   levothyroxine  (SYNTHROID ) 75 MCG tablet Take 1 tablet (75 mcg total) by mouth daily.   magnesium oxide (MAG-OX) 400 (240 Mg) MG tablet Take 800 mg by mouth daily.   metFORMIN  (GLUCOPHAGE -XR) 500 MG 24 hr tablet TAKE 3 TABLETS BY MOUTH DAILY   OVER THE COUNTER MEDICATION Take 1 tablet by mouth daily. Total Beets supplements   pantoprazole  (PROTONIX ) 40 MG tablet Take 40 mg by mouth daily.   spironolactone  (ALDACTONE ) 25 MG tablet TAKE 1 TABLET BY MOUTH DAILY   Turmeric 500 MG CAPS Take 1,000 mg by mouth daily.   valsartan  (DIOVAN ) 320 MG tablet Take 1 tablet (320 mg total) by mouth daily.   [DISCONTINUED] doxazosin  (CARDURA ) 1 MG tablet Take 1 tablet (1 mg total) by mouth at bedtime. TAKE ONE TABLET (2MG ) WITH ONE TABLET (1MG ) FOR A TOTAL DOSE OF 3MG    [DISCONTINUED] doxazosin  (CARDURA ) 2 MG tablet Take 1 tablet (2 mg total) by mouth at  bedtime.   Current Facility-Administered Medications for the 01/19/24 encounter (Office Visit) with Clearnce Curia, NP  Medication   betamethasone  acetate-betamethasone  sodium phosphate  (CELESTONE ) injection 3 mg   betamethasone  acetate-betamethasone  sodium phosphate  (CELESTONE ) injection 3 mg     Allergies:   Contrast media [iodinated contrast media], Amlodipine , Clonidine  derivatives, Codeine, Shrimp [shellfish allergy], and Topamax [topiramate]   Social History   Socioeconomic History   Marital status: Married    Spouse name: Not on file   Number of children: Not on file   Years of education: Not on file   Highest education level: Not on file  Occupational History   Not on file  Tobacco Use   Smoking status: Never   Smokeless tobacco: Never   Vaping Use   Vaping status: Never Used  Substance and Sexual Activity   Alcohol use: No   Drug use: No   Sexual activity: Not on file  Other Topics Concern   Not on file  Social History Narrative   Married   hh of 2   4 dogs   G2 P2   Water exercise.   Retired    [employer]    great grandchild          Social Drivers of Corporate investment Walters Strain: Low Risk  (03/24/2020)   Overall Financial Resource Strain (CARDIA)    Difficulty of Paying Living Expenses: Not hard at all  Food Insecurity: No Food Insecurity (12/21/2023)   Hunger Vital Sign    Worried About Running Out of Food in the Last Year: Never true    Ran Out of Food in the Last Year: Never true  Transportation Needs: No Transportation Needs (12/21/2023)   PRAPARE - Administrator, Civil Service (Medical): No    Lack of Transportation (Non-Medical): No  Physical Activity: Insufficiently Active (12/21/2023)   Exercise Vital Sign    Days of Exercise per Week: 2 days    Minutes of Exercise per Session: 30 min  Stress: Not on file  Social Connections: Not on file     Family History: The patient's family history includes Cancer in her father and son; Diabetes in her sister; Schizophrenia in her son. There is no history of Colon cancer, Colon polyps, Esophageal cancer, Stomach cancer, Pancreatic cancer, or Rectal cancer.  ROS:   Please see the history of present illness.     All other systems reviewed and are negative.  EKGs/Labs/Other Studies Reviewed:         Recent Labs: 11/01/2023: ALT 14; BUN 34; Creatinine, Ser 1.39; Hemoglobin 11.8; Magnesium 1.6; Platelets 258.0; Potassium 4.1; Sodium 141; TSH 2.97   Recent Lipid Panel    Component Value Date/Time   CHOL 143 02/23/2023 1005   TRIG 112.0 02/23/2023 1005   HDL 40.00 02/23/2023 1005   CHOLHDL 4 02/23/2023 1005   VLDL 22.4 02/23/2023 1005   LDLCALC 80 02/23/2023 1005   LDLDIRECT 101.0 03/04/2019 1045    Physical Exam:   VS:  BP (!)  180/80   Pulse 66   Ht 5\' 1"  (1.549 m)   Wt 144 lb 12.8 oz (65.7 kg)   SpO2 96%   BMI 27.36 kg/m  , BMI Body mass index is 27.36 kg/m.  Vitals:   01/19/24 1023 01/19/24 1106  BP: (!) 220/80 (!) 180/80  Pulse: 66   Height: 5\' 1"  (1.549 m)   Weight: 144 lb 12.8 oz (65.7 kg)   SpO2: 96%   BMI (Calculated): 27.37  GENERAL:  Well appearing HEENT: Pupils equal round and reactive, fundi not visualized, oral mucosa unremarkable NECK:  No jugular venous distention, waveform within normal limits, carotid upstroke brisk and symmetric, no bruits, no thyromegaly LYMPHATICS:  No cervical adenopathy LUNGS:  Clear to auscultation bilaterally HEART:  RRR.  PMI not displaced or sustained,S1 and S2 within normal limits, no S3, no S4, no clicks, no rubs, no murmurs ABD:  Flat, positive bowel sounds normal in frequency in pitch, no bruits, no rebound, no guarding, no midline pulsatile mass, no hepatomegaly, no splenomegaly EXT:  2 plus pulses throughout, no edema, no cyanosis no clubbing SKIN:  No rashes no nodules NEURO:  Cranial nerves II through XII grossly intact, motor grossly intact throughout PSYCH:  Cognitively intact, oriented to person place and time   ASSESSMENT/PLAN:    HTN - BP not at goal <130/80.  Initial BP in clinic 220/80 and reeat BP 180/80 without intervention.  Has not yet taken her medication this morning as takes with breakfast and not yet eaten. Has not been checking BP at home. BP ~3 weeks ago with PCP 150/80.  Continue carvedilol  25 mg twice daily, valsartan  320 mg daily, spironolactone  25 mg daily. No longer taking chlorthalidone , would defer resuming due to recent gout flare Increase doxazosin  to 4mg  at bedtime. Can further up-titrate at follow up if needed for BP control. If BP remains persistently elevated in future, consier addition of BID Hydralazine . Secondary workup metanephrines unremarkable renal artery duplex with no stenosis  Grief - Unexpected passing  of her husband 12/02/2023.  Offered condolences.  Has good support system.  Mild OSA - by [facility] sleep study. Declines CPAP or follow up with Dr. Micael Walters. Encouraged to avoid supine sleep.   MR - Mild to moderate by echo 06/2023. Recommend optimal BP control. Presently asymptomatic.   CKDIIIb - Follows with nephrology. Careful titration of antihypertensive.   Hypothyroidism/DM2/HLD-Per primary care team.    Screening for Secondary Hypertension:     12/21/2023    3:00 PM  Causes  Drugs/Herbals Screened     - Comments no OTC agents, limited caffeine, estrogen cream per OB  Renovascular HTN Screened     - Comments 11/2023 renal duplex ordered  Sleep Apnea Screened     - Comments mild OSA, declines CPAP  Thyroid  Disease Screened     - Comments managed by PCP  Hyperaldosteronism Screened     - Comments no hyperaldosteronism by labs 06/2023  Pheochromocytoma Screened     - Comments 11/2023 plasma metanephrines ordered  Cushing's Syndrome N/A     - Comments non cushinoid appearance  Coarctation of the Aorta N/A     - Comments BP symmetric  Compliance Screened    Relevant Labs/Studies:    Latest Ref Rng & Units 11/01/2023   10:28 AM 09/27/2023   10:23 AM 07/20/2023    8:13 AM  Basic Labs  Sodium 135 - 145 mEq/L 141  143  143   Potassium 3.5 - 5.1 mEq/L 4.1  4.1  4.4   Creatinine 0.40 - 1.20 mg/dL 6.04  5.40  9.81        Latest Ref Rng & Units 11/01/2023   10:28 AM 02/23/2023   10:05 AM  Thyroid    TSH 0.35 - 5.50 uIU/mL 2.97  2.20        Latest Ref Rng & Units 07/20/2023    8:13 AM  Renin/Aldosterone   Aldosterone * ng/dL 2  01/19/2024   10:31 AM  Renovascular   Renal Artery US  Completed Yes      Disposition:    FU with MD/APP/PharmD in 6-8 weeks   Medication Adjustments/Labs and Tests Ordered: Current medicines are reviewed at length with the patient today.  Concerns regarding medicines are outlined above.  No orders of the defined types were placed in  this encounter.  Meds ordered this encounter  Medications   doxazosin  (CARDURA ) 4 MG tablet    Sig: Take 1 tablet (4 mg total) by mouth at bedtime.    Dispense:  90 tablet    Refill:  1    Supervising Provider:   Sheryle Walters [[id number]]     Signed, Clearnce Curia, NP  01/22/2024 3:08 PM    [facility]

## 2024-01-19 NOTE — Patient Instructions (Signed)
 Medication Instructions:  Change your Doxazosin  to 4mg  every evening   Testing/Procedures: Preliminary renal duplex results looked good   Follow-Up: Please follow up in 6-8 weeks in [facility] with Dr. Theodis Fiscal, Neomi Banks, NP or Donivan Furry PharmD    Special Instructions:   Check blood pressure 2-3 times per week

## 2024-01-20 ENCOUNTER — Ambulatory Visit (HOSPITAL_BASED_OUTPATIENT_CLINIC_OR_DEPARTMENT_OTHER): Payer: Self-pay | Admitting: Family

## 2024-01-22 ENCOUNTER — Encounter (HOSPITAL_BASED_OUTPATIENT_CLINIC_OR_DEPARTMENT_OTHER): Payer: Self-pay | Admitting: Family

## 2024-01-26 ENCOUNTER — Ambulatory Visit: Admitting: Urology

## 2024-01-26 ENCOUNTER — Encounter: Payer: Self-pay | Admitting: Urology

## 2024-01-26 VITALS — BP 199/78 | HR 60

## 2024-01-26 DIAGNOSIS — N2889 Other specified disorders of kidney and ureter: Secondary | ICD-10-CM

## 2024-01-26 LAB — URINALYSIS, ROUTINE W REFLEX MICROSCOPIC
Bilirubin, UA: NEGATIVE
Glucose, UA: NEGATIVE
Ketones, UA: NEGATIVE
Nitrite, UA: NEGATIVE
RBC, UA: NEGATIVE
Specific Gravity, UA: 1.015 (ref 1.005–1.030)
Urobilinogen, Ur: 0.2 mg/dL (ref 0.2–1.0)
pH, UA: 6 (ref 5.0–7.5)

## 2024-01-26 LAB — MICROSCOPIC EXAMINATION: Bacteria, UA: NONE SEEN

## 2024-01-26 NOTE — Progress Notes (Signed)
 01/26/2024 10:24 AM   Kerry Walters 03/30/1946 [id number]  Referring provider: Florrie Husky, NP [address] [place],  [place] [address]  Renal Mass   HPI: Ms Kerry Walters is a 77yo here for evaluation of a right renal mass. She underwent renal US  3/21 shows a possible 1.9cm right renal mass. She has had a knee replacement. She underwent CT abd in 08/2022 which did not show the renal mass. She has a contrast dye allergy. No hematuria. No significant LUTS. She underwent open right pyelolithotomy in the past.    PMH: Past Medical History:  Diagnosis Date   Allergy    Asthma due to environmental allergies    no inhaler   Diabetes mellitus without complication (HCC)    Diverticulosis of colon    GERD (gastroesophageal reflux disease)    History of esophageal dilatation    FOR STRICTURE IN 2005   History of hiatal hernia    History of hypercalcemia    SECONDARY TO HYPERPARATHYROID   History of hyperparathyroidism    PRIMARY--  S/P RIGHT PARATHYROIDECTOMY   History of kidney stones    History of recurrent UTIs    Hyperlipidemia    Hypertension    Hypothyroidism    Incomplete right bundle branch block    Iron deficiency anemia due to chronic blood loss 12/01/2021   Nephrolithiasis    RIGHT    Nocturia    PONV (postoperative nausea and vomiting)    Pre-diabetes    Sciatica of right side     Surgical History: Past Surgical History:  Procedure Laterality Date   APPENDECTOMY  1974   COLONOSCOPY WITH PROPOFOL  N/A 01/12/2023   Procedure: COLONOSCOPY WITH PROPOFOL ;  Surgeon: Kerry Walters., MD;  Location: [facility];  Service: Gastroenterology;  Laterality: N/A;   CYSTOSCOPY WITH URETEROSCOPY AND STENT PLACEMENT Right 11/11/2014   Procedure: CYSTOSCOPY WITH URETEROSCOPY AND STENT PLACEMENT;  Surgeon: Kerry Banker, MD;  Location: [facility];  Service: Urology;  Laterality: Right;   ENDOSCOPIC MUCOSAL RESECTION N/A 01/12/2023   Procedure:  ENDOSCOPIC MUCOSAL RESECTION;  Surgeon: Kerry Walters., MD;  Location: [facility];  Service: Gastroenterology;  Laterality: N/A;   ESOPHAGOGASTRODUODENOSCOPY (EGD) WITH PROPOFOL  N/A 01/12/2023   Procedure: ESOPHAGOGASTRODUODENOSCOPY (EGD) WITH PROPOFOL ;  Surgeon: Kerry Walters., MD;  Location: [facility];  Service: Gastroenterology;  Laterality: N/A;   EXCISION URETHRAL CARUNCLE  1981   Open procedure   HEMOSTASIS CLIP PLACEMENT  01/12/2023   Procedure: HEMOSTASIS CLIP PLACEMENT;  Surgeon: Kerry Walters., MD;  Location: [facility];  Service: Gastroenterology;;   HEMOSTASIS CONTROL  01/12/2023   Procedure: HEMOSTASIS CONTROL;  Surgeon: Kerry Walters., MD;  Location: [facility];  Service: Gastroenterology;;   KNEE ARTHROSCOPY Left april 2010   POLYPECTOMY  01/12/2023   Procedure: POLYPECTOMY;  Surgeon: Kerry Walters., MD;  Location: [facility];  Service: Gastroenterology;;  gastric and colon   REMOVAL RIP  1973   First Rib on Right side-- for impingement   RIGHT INFERIOR PARATHYROIDECTOMY, MINIMALLY INVASIVE  08-14-2008   SUBMUCOSAL LIFTING INJECTION  01/12/2023   Procedure: SUBMUCOSAL LIFTING INJECTION;  Surgeon: Kerry Walters., MD;  Location: [facility];  Service: Gastroenterology;;   TONSILLECTOMY  as child   TOTAL KNEE ARTHROPLASTY Left 11-17-2009   TUBAL LIGATION  1973   VAGINAL HYSTERECTOMY  1995   w/ Bilateral Salpingoophorectomy    Home Medications:  Allergies as of 01/26/2024       Reactions   Contrast Media [  iodinated Contrast Media] Shortness Of Breath   Pt states in the 80's at Urologist visit had asthma attack reaction to IVP dye. Told not to have it again.    Amlodipine  Other (See Comments)   Swelling    Clonidine  Derivatives Swelling, Other (See Comments)   Dry mouth, severe drowsiness.    Codeine Nausea Only   Shrimp [shellfish Allergy] Swelling   Only Shrimp   Topamax [topiramate] Rash         Medication List        Accurate as of Jan 26, 2024 10:24 AM. If you have any questions, ask your nurse or doctor.          acetaminophen  500 MG tablet Commonly known as: TYLENOL  Take 500-1,000 mg by mouth every 6 (six) hours as needed for moderate pain.   atorvastatin  20 MG tablet Commonly known as: LIPITOR TAKE 1 TABLET BY MOUTH IN THE  EVENING   B-12 3000 MCG Caps Take 3,000 mcg by mouth daily.   carvedilol  25 MG tablet Commonly known as: COREG  Take 1 tablet (25 mg total) by mouth 2 (two) times daily with a meal.   cetirizine 10 MG tablet Commonly known as: ZYRTEC Take 10 mg by mouth daily as needed for allergies.   doxazosin  4 MG tablet Commonly known as: Cardura  Take 1 tablet (4 mg total) by mouth at bedtime.   ferrous sulfate  325 (65 FE) MG tablet Take 1 tablet (325 mg total) by mouth every other day. What changed: when to take this   fluticasone 50 MCG/ACT nasal spray Commonly known as: FLONASE Place 1 spray into both nostrils daily as needed for allergies or rhinitis.   latanoprost 0.005 % ophthalmic solution Commonly known as: XALATAN Place 1 drop into both eyes at bedtime.   levothyroxine  75 MCG tablet Commonly known as: SYNTHROID  Take 1 tablet (75 mcg total) by mouth daily.   magnesium oxide 400 (240 Mg) MG tablet Commonly known as: MAG-OX Take 800 mg by mouth daily.   metFORMIN  500 MG 24 hr tablet Commonly known as: GLUCOPHAGE -XR TAKE 3 TABLETS BY MOUTH DAILY   OVER THE COUNTER MEDICATION Take 1 tablet by mouth daily. Total Beets supplements   pantoprazole  40 MG tablet Commonly known as: PROTONIX  Take 40 mg by mouth daily.   Premarin  vaginal cream Generic drug: conjugated estrogens  Place vaginally as directed. Use 2-3 times weekly  prnPlace 1 Applicatorful vaginally as directed. Use 2-3 times weekly  prn What changed: additional instructions   spironolactone  25 MG tablet Commonly known as: ALDACTONE  TAKE 1 TABLET BY MOUTH DAILY    Turmeric 500 MG Caps Take 1,000 mg by mouth daily.   valsartan  320 MG tablet Commonly known as: DIOVAN  Take 1 tablet (320 mg total) by mouth daily.   Vitamin D 50 MCG (2000 UT) tablet Take 6,000 Units by mouth daily.        Allergies:  Allergies  Allergen Reactions   Contrast Media [Iodinated Contrast Media] Shortness Of Breath    Pt states in the 80's at Urologist visit had asthma attack reaction to IVP dye. Told not to have it again.    Amlodipine  Other (See Comments)    Swelling    Clonidine  Derivatives Swelling and Other (See Comments)    Dry mouth, severe drowsiness.    Codeine Nausea Only   Shrimp [Shellfish Allergy] Swelling    Only Shrimp   Topamax [Topiramate] Rash    Family History: Family History  Problem Relation Age of Onset  Cancer Father        brain   Cancer Son        liver   Diabetes Sister    Schizophrenia Son    Colon cancer Neg Hx    Colon polyps Neg Hx    Esophageal cancer Neg Hx    Stomach cancer Neg Hx    Pancreatic cancer Neg Hx    Rectal cancer Neg Hx     Social History:  reports that she has never smoked. She has never used smokeless tobacco. She reports that she does not drink alcohol and does not use drugs.  ROS: All other review of systems were reviewed and are negative except what is noted above in HPI  Physical Exam: BP (!) 199/78   Pulse 60   Constitutional:  Alert and oriented, No acute distress. HEENT: [place] AT, moist mucus membranes.  Trachea midline, no masses. Cardiovascular: No clubbing, cyanosis, or edema. Respiratory: Normal respiratory effort, no increased work of breathing. GI: Abdomen is soft, nontender, nondistended, no abdominal masses GU: No CVA tenderness.  Lymph: No cervical or inguinal lymphadenopathy. Skin: No rashes, bruises or suspicious lesions. Neurologic: Grossly intact, no focal deficits, moving all 4 extremities. Psychiatric: Normal mood and affect.  Laboratory Data: Lab Results  Component  Value Date   WBC 8.6 11/01/2023   HGB 11.8 (L) 11/01/2023   HCT 35.3 (L) 11/01/2023   MCV 89.6 11/01/2023   PLT 258.0 11/01/2023    Lab Results  Component Value Date   CREATININE 1.39 (H) 11/01/2023    No results found for: "PSA"  No results found for: "TESTOSTERONE"  Lab Results  Component Value Date   HGBA1C 5.9 (A) 10/11/2023    Urinalysis    Component Value Date/Time   COLORURINE YELLOW 09/14/2021 1259   APPEARANCEUR Cloudy (A) 09/14/2021 1259   LABSPEC 1.010 09/14/2021 1259   PHURINE 7.0 09/14/2021 1259   GLUCOSEU NEGATIVE 09/14/2021 1259   HGBUR SMALL (A) 09/14/2021 1259   HGBUR negative 01/26/2010 0921   BILIRUBINUR Negative 10/11/2023 1157   KETONESUR NEGATIVE 09/14/2021 1259   PROTEINUR Positive (A) 10/11/2023 1157   PROTEINUR NEGATIVE 11/19/2009 1442   UROBILINOGEN 0.2 10/11/2023 1157   UROBILINOGEN 0.2 09/14/2021 1259   NITRITE Negative 10/11/2023 1157   NITRITE NEGATIVE 09/14/2021 1259   LEUKOCYTESUR Negative 10/11/2023 1157   LEUKOCYTESUR LARGE (A) 09/14/2021 1259    Lab Results  Component Value Date   MUCUS Presence of (A) 09/14/2021   BACTERIA Many(>50/hpf) (A) 09/14/2021    Pertinent Imaging: Renal US  11/17/2023: Images reviewed and discussed with the patient  Results for orders placed during the hospital encounter of 08/27/07  DG Abd 1 View  Narrative This report is delayed due to PACS failure. Clinical Data: Preoperative exam. Right renal calculi. ABDOMEN - 1 VIEW: Findings: There are several stones overlying the lower pole of the right kidney. One measures approximately 8 mm in diameter and another is approximately 6 mm in diameter. There is a third stone measuring approximately 4 mm in diameter. No stones are seen overlying the left kidney. There are several calcifications in the pelvis which are probably phleboliths. Mild degenerative changes are present in the lower lumbar spine.  Impression Multiple calculi in the lower pole of the  right kidney.  Provider: Lissa Riding  No results found for this or any previous visit.  No results found for this or any previous visit.  No results found for this or any previous visit.  Results for orders  placed during the hospital encounter of 11/17/23  US  RENAL  Narrative CLINICAL DATA:  Chronic uremia  EXAM: RENAL / URINARY TRACT ULTRASOUND COMPLETE  COMPARISON:  Abdomen ultrasound Jan 14, 2021, CT abdomen September 22, 2022  FINDINGS: Right Kidney:  Renal measurements: 10.3 x 3.7 x 5.9 cm = volume: 117 mL. Increased echotexture. No hydronephrosis. 1.3 x 1.1 x 1.1 cm simple cyst identified, no follow-up is recommended. 1.9 x 1.9 x 1.9 cm hypoechoic mass in the midpole right kidney.  Left Kidney:  Renal measurements: 10.6 x 5.7 x 4.5 cm = volume: 144 mL. Echogenicity within normal limits. No mass or hydronephrosis visualized.  Bladder:  Appears normal for degree of bladder distention. Prevoid volume of 85 cc, postvoid volume of 0 cc.  Other:  None.  IMPRESSION: 1. 1.9 cm hypoechoic mass in the midpole right kidney. Renal cell carcinoma is not excluded. Recommend further evaluation with renal protocol CT or MRI. 2. Increased echotexture of the right kidney, nonspecific but can be seen in medical renal disease.   Electronically Signed By: Anna Barnes M.D. On: 11/23/2023 13:43  No results found for this or any previous visit.  No results found for this or any previous visit.  No results found for this or any previous visit.   Assessment & Plan:    1. Renal mass (Primary) We will obtain MRi Abd - Urinalysis, Routine w reflex microscopic   No follow-ups on file.  Johnie Nailer, MD  [facility] [place]

## 2024-01-26 NOTE — Patient Instructions (Signed)
 A Growth in the Kidney (Renal Mass): What to Know  A renal mass is an abnormal growth in the kidney. It may be found during an MRI, CT scan, or ultrasound that's done to check for other problems in the belly. Some renal masses are cancerous, or malignant, and can grow or spread quickly. Others are benign, which means they're not cancer. Renal masses include: Tumors. These may be either cancerous or benign. The most common cancerous tumor in adults is called renal cell carcinoma. In children, the most common type is Wilms tumor. The most common kidney tumors that aren't cancer include renal adenomas, oncocytomas, and angiomyolipomas (AML). Cysts. These are pockets of fluid that form on or in the kidney. What are the causes? Certain types of cancers, infections, or injuries can cause a renal mass. It's not always known what causes a cyst to form in or on the kidney. What are the signs or symptoms? Often, a renal mass or kidney cyst doesn't cause any signs or symptoms. How is this diagnosed? Your health care provider may suggest tests to diagnose the cause of your renal mass. These tests may include: A physical exam. Blood tests. Pee (urine) tests. Imaging tests, such as: CT scan. MRI. Ultrasound. Chest X-ray or bone scan. These may be done if a tumor is cancerous to see if the cancer has spread outside the kidney. Biopsy. This is when a small piece of tissue is removed from the renal mass for testing. How is this treated? Treatment is not always needed for a renal mass. Treatment will depend on the cause of the mass and if it's causing any problems or symptoms. For a cancerous renal mass, treatment options may include: Surgery. This is done to remove the tumor and any affected tissue. Chemotherapy. This uses medicines to kill cancer cells. Radiation. High-energy X-rays or gamma rays are used to kill cancer cells. Ablation. This uses extreme hot or cold temperature to kill the cancer  cells. Immunotherapy. Medicines are used to help the body's defense system (immune system) fight the cancer cells. Taking part in clinical trials. This involves trying new or experimental treatments to see if they're effective. Most kidney cysts don't need to be treated. Follow these instructions at home: What you need to do at home will depend on the cause of the mass. Follow the instructions that your provider gives you. In general: Take medicines only as told. If you were given antibiotics, take them as told. Do not stop taking them even if you start to feel better. Follow any instructions from your provider about what you can and can't do. Do not smoke, vape, or use nicotine or tobacco. Keep all follow-up visits. Your provider will need to check if your renal mass has changed or grown. Contact a health care provider if: You have flank pain, which is pain in your side or back. You have a fever. You have a loss of appetite. You have pain or swelling in your belly. You lose weight. Get help right away if: Your pain gets worse. There's blood in your pee. You can't pee. This information is not intended to replace advice given to you by your health care provider. Make sure you discuss any questions you have with your health care provider. Document Revised: 02/10/2023 Document Reviewed: 02/10/2023 Elsevier Patient Education  2024 [employer].

## 2024-01-29 ENCOUNTER — Telehealth: Payer: Self-pay | Admitting: Urology

## 2024-01-29 NOTE — Telephone Encounter (Signed)
 Patient called she can't schedule the MRI she is allergic to the contrast dye.  Please call

## 2024-01-31 DIAGNOSIS — Z08 Encounter for follow-up examination after completed treatment for malignant neoplasm: Secondary | ICD-10-CM | POA: Diagnosis not present

## 2024-01-31 DIAGNOSIS — Z85828 Personal history of other malignant neoplasm of skin: Secondary | ICD-10-CM | POA: Diagnosis not present

## 2024-02-05 ENCOUNTER — Telehealth: Payer: Self-pay | Admitting: Urology

## 2024-02-05 NOTE — Telephone Encounter (Signed)
 Patient called back and she will not use dye no matter what

## 2024-02-05 NOTE — Telephone Encounter (Signed)
 Patient called with no answer. Detailed message left reiterating that the dye in imaging is not the same. Call back to office requested.

## 2024-02-05 NOTE — Telephone Encounter (Signed)
 error

## 2024-02-08 DIAGNOSIS — R809 Proteinuria, unspecified: Secondary | ICD-10-CM | POA: Diagnosis not present

## 2024-02-08 DIAGNOSIS — N1832 Chronic kidney disease, stage 3b: Secondary | ICD-10-CM | POA: Diagnosis not present

## 2024-02-08 DIAGNOSIS — I129 Hypertensive chronic kidney disease with stage 1 through stage 4 chronic kidney disease, or unspecified chronic kidney disease: Secondary | ICD-10-CM | POA: Diagnosis not present

## 2024-02-13 DIAGNOSIS — M9904 Segmental and somatic dysfunction of sacral region: Secondary | ICD-10-CM | POA: Diagnosis not present

## 2024-02-13 DIAGNOSIS — M9903 Segmental and somatic dysfunction of lumbar region: Secondary | ICD-10-CM | POA: Diagnosis not present

## 2024-02-13 DIAGNOSIS — M9905 Segmental and somatic dysfunction of pelvic region: Secondary | ICD-10-CM | POA: Diagnosis not present

## 2024-02-13 DIAGNOSIS — M51361 Other intervertebral disc degeneration, lumbar region with lower extremity pain only: Secondary | ICD-10-CM | POA: Diagnosis not present

## 2024-02-23 ENCOUNTER — Ambulatory Visit (HOSPITAL_COMMUNITY)
Admission: RE | Admit: 2024-02-23 | Discharge: 2024-02-23 | Disposition: A | Discharge status: Home / Self Care | Source: Ambulatory Visit | Attending: Urology | Admitting: Urology

## 2024-02-23 DIAGNOSIS — N1330 Unspecified hydronephrosis: Secondary | ICD-10-CM | POA: Diagnosis not present

## 2024-02-23 DIAGNOSIS — D3501 Benign neoplasm of right adrenal gland: Secondary | ICD-10-CM | POA: Diagnosis not present

## 2024-02-23 DIAGNOSIS — N2889 Other specified disorders of kidney and ureter: Secondary | ICD-10-CM | POA: Diagnosis not present

## 2024-02-23 DIAGNOSIS — N281 Cyst of kidney, acquired: Secondary | ICD-10-CM | POA: Diagnosis not present

## 2024-02-23 DIAGNOSIS — N289 Disorder of kidney and ureter, unspecified: Secondary | ICD-10-CM | POA: Diagnosis not present

## 2024-02-23 MED ORDER — GADOBUTROL 1 MMOL/ML IV SOLN
6.0000 mL | Freq: Once | INTRAVENOUS | Status: AC | PRN
  Administered 2024-02-23: 6 mL via INTRAVENOUS

## 2024-02-28 ENCOUNTER — Other Ambulatory Visit: Payer: Self-pay | Admitting: Internal Medicine

## 2024-03-05 ENCOUNTER — Other Ambulatory Visit (INDEPENDENT_AMBULATORY_CARE_PROVIDER_SITE_OTHER)

## 2024-03-05 DIAGNOSIS — N1832 Chronic kidney disease, stage 3b: Secondary | ICD-10-CM

## 2024-03-05 DIAGNOSIS — E1165 Type 2 diabetes mellitus with hyperglycemia: Secondary | ICD-10-CM | POA: Diagnosis not present

## 2024-03-05 DIAGNOSIS — M1000 Idiopathic gout, unspecified site: Secondary | ICD-10-CM

## 2024-03-05 DIAGNOSIS — Z79899 Other long term (current) drug therapy: Secondary | ICD-10-CM | POA: Diagnosis not present

## 2024-03-05 DIAGNOSIS — I10 Essential (primary) hypertension: Secondary | ICD-10-CM

## 2024-03-05 DIAGNOSIS — E039 Hypothyroidism, unspecified: Secondary | ICD-10-CM

## 2024-03-05 LAB — CBC WITH DIFFERENTIAL/PLATELET
Basophils Absolute: 0.1 K/uL (ref 0.0–0.1)
Basophils Relative: 0.8 % (ref 0.0–3.0)
Eosinophils Absolute: 0.2 K/uL (ref 0.0–0.7)
Eosinophils Relative: 3.5 % (ref 0.0–5.0)
HCT: 31.4 % — ABNORMAL LOW (ref 36.0–46.0)
Hemoglobin: 10.6 g/dL — ABNORMAL LOW (ref 12.0–15.0)
Lymphocytes Relative: 20.5 % (ref 12.0–46.0)
Lymphs Abs: 1.4 K/uL (ref 0.7–4.0)
MCHC: 33.7 g/dL (ref 30.0–36.0)
MCV: 88.2 fl (ref 78.0–100.0)
Monocytes Absolute: 0.5 K/uL (ref 0.1–1.0)
Monocytes Relative: 6.9 % (ref 3.0–12.0)
Neutro Abs: 4.8 K/uL (ref 1.4–7.7)
Neutrophils Relative %: 68.3 % (ref 43.0–77.0)
Platelets: 242 K/uL (ref 150.0–400.0)
RBC: 3.57 Mil/uL — ABNORMAL LOW (ref 3.87–5.11)
RDW: 14.7 % (ref 11.5–15.5)
WBC: 7 K/uL (ref 4.0–10.5)

## 2024-03-05 LAB — LIPID PANEL
Cholesterol: 129 mg/dL (ref 0–200)
HDL: 44.8 mg/dL (ref >39.00)
LDL Cholesterol: 68 mg/dL (ref 0–99)
NonHDL: 84.41
Total CHOL/HDL Ratio: 3
Triglycerides: 80 mg/dL (ref 0.0–149.0)
VLDL: 16 mg/dL (ref 0.0–40.0)

## 2024-03-05 LAB — BASIC METABOLIC PANEL WITH GFR
BUN: 23 mg/dL (ref 6–23)
CO2: 29 meq/L (ref 19–32)
Calcium: 10.3 mg/dL (ref 8.4–10.5)
Chloride: 109 meq/L (ref 96–112)
Creatinine, Ser: 1.49 mg/dL — ABNORMAL HIGH (ref 0.40–1.20)
GFR: 33.08 mL/min — ABNORMAL LOW (ref >60.00)
Glucose, Bld: 121 mg/dL — ABNORMAL HIGH (ref 70–99)
Potassium: 4.5 meq/L (ref 3.5–5.1)
Sodium: 144 meq/L (ref 135–145)

## 2024-03-05 LAB — HEPATIC FUNCTION PANEL
ALT: 14 U/L (ref 0–35)
AST: 20 U/L (ref 0–37)
Albumin: 3.9 g/dL (ref 3.5–5.2)
Alkaline Phosphatase: 57 U/L (ref 39–117)
Bilirubin, Direct: 0.1 mg/dL (ref 0.0–0.3)
Total Bilirubin: 0.4 mg/dL (ref 0.2–1.2)
Total Protein: 5.9 g/dL — ABNORMAL LOW (ref 6.0–8.3)

## 2024-03-05 LAB — URIC ACID: Uric Acid, Serum: 7.8 mg/dL — ABNORMAL HIGH (ref 2.4–7.0)

## 2024-03-05 LAB — HEMOGLOBIN A1C: Hgb A1c MFr Bld: 6 % (ref 4.6–6.5)

## 2024-03-12 ENCOUNTER — Ambulatory Visit: Admitting: Internal Medicine

## 2024-03-12 ENCOUNTER — Encounter: Payer: Self-pay | Admitting: Internal Medicine

## 2024-03-12 VITALS — BP 208/80 | HR 57 | Temp 97.8°F | Wt 143.2 lb

## 2024-03-12 DIAGNOSIS — N1832 Chronic kidney disease, stage 3b: Secondary | ICD-10-CM

## 2024-03-12 DIAGNOSIS — Z87442 Personal history of urinary calculi: Secondary | ICD-10-CM | POA: Diagnosis not present

## 2024-03-12 DIAGNOSIS — Z79899 Other long term (current) drug therapy: Secondary | ICD-10-CM | POA: Diagnosis not present

## 2024-03-12 DIAGNOSIS — I10 Essential (primary) hypertension: Secondary | ICD-10-CM | POA: Diagnosis not present

## 2024-03-12 DIAGNOSIS — R5383 Other fatigue: Secondary | ICD-10-CM

## 2024-03-12 DIAGNOSIS — E1165 Type 2 diabetes mellitus with hyperglycemia: Secondary | ICD-10-CM

## 2024-03-12 DIAGNOSIS — Z7984 Long term (current) use of oral hypoglycemic drugs: Secondary | ICD-10-CM | POA: Diagnosis not present

## 2024-03-12 NOTE — Patient Instructions (Signed)
 Good to see youo today  Will send notes to the HT team for advice .  Blood sugar is ok .  Cost of farxiga   is too much can help kidneys and  heart and sometimes BP.   maybe jardiance similar is covered .

## 2024-03-12 NOTE — Progress Notes (Signed)
 Chief Complaint  Patient presents with   Hypertension    HPI: Kerry Walters 78 y.o. come in for Chronic disease management  Since her last visit she states she feels tired and thinks it is from the medicine otherwise okay has seen multiple specialists Urologist with MRA of abdomen for question renal lesion see report right mild renal atrophy cortical and hydronephrosis she states she has a past history of multiple renal stones.  Currently no symptoms. Has also seen a nephrologist 2 times blood pressure was in the 140-150 range at most at that time had multiple extensive blood work done.  Was given Farxiga samples to be helpful but a prescription was so expensive she could not afford it so did not take it anymore. Has seen cardiology team and has a follow-up with the cardiology hypertension clinic has been placed on dose doxasosin  2x 4 mg at night.  She is not sure if any of these meds are really helping her blood pressure. Psychologically has been difficult since the loss of her husband but she does have support her grandson lives with her  Support pastor and friends  Very tired .  Loves taking care of her flowers but does not have the energy at this point she had in the past.    ROS: See pertinent positives and negatives per HPI.  Denies chest pain shortness of breath does have some swelling around the ankles near the end of the day is very concerned about this.  Past Medical History:  Diagnosis Date   Allergy    Asthma due to environmental allergies    no inhaler   Diabetes mellitus without complication (HCC)    Diverticulosis of colon    GERD (gastroesophageal reflux disease)    History of esophageal dilatation    FOR STRICTURE IN 2005   History of hiatal hernia    History of hypercalcemia    SECONDARY TO HYPERPARATHYROID   History of hyperparathyroidism    PRIMARY--  S/P RIGHT PARATHYROIDECTOMY   History of kidney stones    History of recurrent UTIs    Hyperlipidemia     Hypertension    Hypothyroidism    Incomplete right bundle branch block    Iron deficiency anemia due to chronic blood loss 12/01/2021   Nephrolithiasis    RIGHT    Nocturia    PONV (postoperative nausea and vomiting)    Pre-diabetes    Sciatica of right side     Family History  Problem Relation Age of Onset   Cancer Father        brain   Cancer Son        liver   Diabetes Sister    Schizophrenia Son    Colon cancer Neg Hx    Colon polyps Neg Hx    Esophageal cancer Neg Hx    Stomach cancer Neg Hx    Pancreatic cancer Neg Hx    Rectal cancer Neg Hx     Social History   Socioeconomic History   Marital status: Married    Spouse name: Not on file   Number of children: Not on file   Years of education: Not on file   Highest education level: Not on file  Occupational History   Not on file  Tobacco Use   Smoking status: Never   Smokeless tobacco: Never  Vaping Use   Vaping status: Never Used  Substance and Sexual Activity   Alcohol use: No   Drug use:  No   Sexual activity: Not on file  Other Topics Concern   Not on file  Social History Narrative   Married   hh of 2   4 dogs   G2 P2   Water exercise.   Retired    [employer]    great grandchild          Social Drivers of Corporate investment banker Strain: Low Risk  (03/24/2020)   Overall Financial Resource Strain (CARDIA)    Difficulty of Paying Living Expenses: Not hard at all  Food Insecurity: No Food Insecurity (12/21/2023)   Hunger Vital Sign    Worried About Running Out of Food in the Last Year: Never true    Ran Out of Food in the Last Year: Never true  Transportation Needs: No Transportation Needs (12/21/2023)   PRAPARE - Administrator, Civil Service (Medical): No    Lack of Transportation (Non-Medical): No  Physical Activity: Insufficiently Active (12/21/2023)   Exercise Vital Sign    Days of Exercise per Week: 2 days    Minutes of Exercise per Session: 30 min  Stress: Not on file   Social Connections: Not on file    Outpatient Medications Prior to Visit  Medication Sig Dispense Refill   acetaminophen  (TYLENOL ) 500 MG tablet Take 500-1,000 mg by mouth every 6 (six) hours as needed for moderate pain.     atorvastatin  (LIPITOR) 20 MG tablet TAKE 1 TABLET BY MOUTH IN THE  EVENING 100 tablet 2   carvedilol  (COREG ) 25 MG tablet Take 1 tablet (25 mg total) by mouth 2 (two) times daily with a meal. 180 tablet 3   cetirizine (ZYRTEC) 10 MG tablet Take 10 mg by mouth daily as needed for allergies.     Cholecalciferol (VITAMIN D) 50 MCG (2000 UT) tablet Take 6,000 Units by mouth daily.     conjugated estrogens  (PREMARIN ) vaginal cream Place vaginally as directed. Use 2-3 times weekly  prnPlace 1 Applicatorful vaginally as directed. Use 2-3 times weekly  prn (Patient taking differently: Place vaginally as directed. 1 Applicatorful vaginally as directed. Use 2-3 times weekly  prn) 42 g 4   Cyanocobalamin  (B-12) 3000 MCG CAPS Take 3,000 mcg by mouth daily.     doxazosin  (CARDURA ) 4 MG tablet Take 1 tablet (4 mg total) by mouth at bedtime. 90 tablet 1   ferrous sulfate  325 (65 FE) MG tablet Take 1 tablet (325 mg total) by mouth every other day. (Patient taking differently: Take 325 mg by mouth daily.) 15 tablet 11   fluticasone (FLONASE) 50 MCG/ACT nasal spray Place 1 spray into both nostrils daily as needed for allergies or rhinitis.     latanoprost (XALATAN) 0.005 % ophthalmic solution Place 1 drop into both eyes at bedtime.     levothyroxine  (SYNTHROID ) 75 MCG tablet Take 1 tablet (75 mcg total) by mouth daily. 100 tablet 2   magnesium oxide (MAG-OX) 400 (240 Mg) MG tablet Take 800 mg by mouth daily.     metFORMIN  (GLUCOPHAGE -XR) 500 MG 24 hr tablet TAKE 3 TABLETS BY MOUTH DAILY 300 tablet 2   OVER THE COUNTER MEDICATION Take 1 tablet by mouth daily. Total Beets supplements     pantoprazole  (PROTONIX ) 40 MG tablet Take 40 mg by mouth daily.     spironolactone  (ALDACTONE ) 25 MG  tablet TAKE 1 TABLET BY MOUTH DAILY 90 tablet 3   Turmeric 500 MG CAPS Take 1,000 mg by mouth daily.     valsartan  (  DIOVAN ) 320 MG tablet Take 1 tablet (320 mg total) by mouth daily. 90 tablet 3   Facility-Administered Medications Prior to Visit  Medication Dose Route Frequency Provider Last Rate Last Admin   betamethasone  acetate-betamethasone  sodium phosphate  (CELESTONE ) injection 3 mg  3 mg Intramuscular Once Evans, Brent M, DPM       betamethasone  acetate-betamethasone  sodium phosphate  (CELESTONE ) injection 3 mg  3 mg Intramuscular Once Evans, Brent M, DPM         EXAM:  BP (!) 208/80   Pulse (!) 57   Temp 97.8 F (36.6 C)   Wt 143 lb 3.2 oz (65 kg)   SpO2 98%   BMI 27.06 kg/m   Body mass index is 27.06 kg/m.  GENERAL: vitals reviewed and listed above, alert, oriented, appears well hydrated and in no acute distress HEENT: atraumatic, conjunctiva  clear, no obvious abnormalities on inspection of external nose and ears NECK: no obvious masses on inspection palpation  LUNGS: clear to auscultation bilaterally, no wheezes, rales or rhonchi, good air movement CV: HRRR, 1-2/6 intermittent systolic murmur upper border.  No clubbing cyanosis slight peripheral edema nl cap refill  MS: moves all extremities without noticeable focal  abnormality PSYCH: pleasant and cooperative, no obvious depression or anxiety Lab Results  Component Value Date   WBC 7.0 03/05/2024   HGB 10.6 (L) 03/05/2024   HCT 31.4 (L) 03/05/2024   PLT 242.0 03/05/2024   GLUCOSE 121 (H) 03/05/2024   CHOL 129 03/05/2024   TRIG 80.0 03/05/2024   HDL 44.80 03/05/2024   LDLDIRECT 101.0 03/04/2019   LDLCALC 68 03/05/2024   ALT 14 03/05/2024   AST 20 03/05/2024   NA 144 03/05/2024   K 4.5 03/05/2024   CL 109 03/05/2024   CREATININE 1.49 (H) 03/05/2024   BUN 23 03/05/2024   CO2 29 03/05/2024   TSH 2.97 11/01/2023   INR 1.12 11/19/2009   HGBA1C 6.0 03/05/2024   MICROALBUR 149.6 (H) 10/11/2023   BP Readings  from Last 3 Encounters:  03/12/24 (!) 208/80  01/26/24 (!) 199/78  01/19/24 (!) 180/80    ASSESSMENT AND PLAN:  Discussed the following assessment and plan:  Uncontrolled hypertension  Tired - ? jpw uch medication is contributing.  Medication management  Stage 3b chronic kidney disease (HCC)  Type 2 diabetes mellitus with hyperglycemia, without long-term current use of insulin (HCC)  Personal history of kidney stones Resistant hypertension with multiple medications and feels that some of her fatigue is related to all the medications. Has follow-up appointment with advanced hypertension clinic will send information to the team. Uncertain if her insurance would cover Jardiance better than Farxiga or sometimes of SGLT2 for renal protection but we will put that on hold at this time. Has mild anemia on follow-up metabolically plan follow-up in 3 months.could be  anemia of chronic disease  No active bleeding or aware of active bleeding. She has a history of gout and uric acid was elevated but I did not address this today as far as adding more medication yet.  Would like to get her blood pressure under control with the least amount of medicine and side effects possible.  -Patient advised to return or notify health care team  if  new concerns arise.  Patient Instructions  Good to see youo today  Will send notes to the HT team for advice .  Blood sugar is ok .  Cost of farxiga   is too much can help kidneys and  heart and  sometimes BP.   maybe jardiance similar is covered .      Eldra Word K. Jayden Kratochvil M.D.

## 2024-03-14 ENCOUNTER — Telehealth (HOSPITAL_BASED_OUTPATIENT_CLINIC_OR_DEPARTMENT_OTHER): Payer: Self-pay | Admitting: *Deleted

## 2024-03-14 NOTE — Telephone Encounter (Signed)
 Lvm for pt to call back with updated BP readings or can bring to follow up visit with Kristin, Pharm-D in HTN clinic next week.

## 2024-03-14 NOTE — Telephone Encounter (Signed)
-----   Message from Reche GORMAN Finder sent at 03/14/2024  7:49 AM EDT ----- Regarding: RE: Thank you for the update!  Kristin - FYI as she sees you next week  Triage team - can we call to get some recent BP readings from her? TY! ----- Message ----- From: Charlett Apolinar POUR, MD Sent: 03/12/2024   5:51 PM EDT To: Reche GORMAN Finder, NP  FYI systolic uncontrolled despite multiple meds  has upcoming appt with  advanced ht clinic . She is tired and uncertain if from meds or other

## 2024-03-18 NOTE — Progress Notes (Signed)
 Office Visit    Patient Name: Kerry Walters Date of Encounter: 03/19/2024  Primary Care Provider:  Charlett Apolinar POUR, MD Primary Cardiologist:  Shelda Bruckner, MD  Chief Complaint    Hypertension - Advanced hypertension clinic  Past Medical History   DM2 7/25 A1c 6, on metformin  1500 mg daily  CKD Stage IIIb  SCr 1.49, GFR 33  hypothyroid 3/25 TSH WNL on levothyroxine  75   HLD 7/25 LDL 68 on atorvastatin  20 mg daily  gout 7/25 uric acid 7.8 (avoid diuretics for now)    Allergies  Allergen Reactions   Contrast Media [Iodinated Contrast Media] Shortness Of Breath    Pt states in the 80's at Urologist visit had asthma attack reaction to IVP dye. Told not to have it again.    Amlodipine  Other (See Comments)    Swelling    Clonidine  Derivatives Swelling and Other (See Comments)    Dry mouth, severe drowsiness.    Codeine Nausea Only   Shrimp [Shellfish Allergy] Swelling    Only Shrimp   Topamax [Topiramate] Rash    History of Present Illness    Kerry Walters is a 78 y.o. female patient who was referred to the [facility]pert[facility] by Dr. Bruckner.  She was seen by Reche Finder in May, with a pressure of 180/80.  She was going through an emotional hurdle, as her husband died suddenly in 01/19/24 after 62 years of marriage.  Caitlin further increased her doxazosin  to 4 mg each night.  She does have a history of gout, which limits our medication options some.   Secondary workup has been unremarkable.    She is in the office today for follow up. Notes that she has been developing edema in her ankles and calves.  Starts in the late mornings and worsens over the day.  Does not seem to go away by elevating her feet, but does lessen overnight.  She has also determined that the medications are making her BP worse, that she is probably fine without them.  Patient has a home BP device, checks randomly, but does not write down the results or take at consistent times of day.     Blood Pressure Goal:  130/80  Current Medications: carvedilol  25 mg bid, doxazosin  4 mg nightly, spironolactone  25 mg daily, valsartan  320 mg daily  Previously tried:   amlodipine  - edema   Clonidine  - edema  Social Hx:      Tobacco:  no  Alcohol:  no  Caffeine:  coffee in the am, half sweet tea, occasional diet soda   Diet:   more home cooked meals; doesn't  eat much; usually 2 meals per day; breakfast is eggs and bacon most mornings; no pastas, some meats or beans for protein, some vegetables, not regularly   Exercise: yard work, Tree surgeon cookies  Home BP readings: doesn't check regularly - only random, no readings with her   Accessory Clinical Findings    Lab Results  Component Value Date   CREATININE 1.49 (H) 03/05/2024   BUN 23 03/05/2024   NA 144 03/05/2024   K 4.5 03/05/2024   CL 109 03/05/2024   CO2 29 03/05/2024   Lab Results  Component Value Date   ALT 14 03/05/2024   AST 20 03/05/2024   ALKPHOS 57 03/05/2024   BILITOT 0.4 03/05/2024   Lab Results  Component Value Date   HGBA1C 6.0 03/05/2024    Screening for Secondary Hypertension:      12/21/2023  3:00 PM  Causes  Drugs/Herbals Screened     - Comments no OTC agents, limited caffeine, estrogen cream per OB  Renovascular HTN Screened     - Comments 11/2023 renal duplex ordered  Sleep Apnea Screened     - Comments mild OSA, declines CPAP  Thyroid  Disease Screened     - Comments managed by PCP  Hyperaldosteronism Screened     - Comments no hyperaldosteronism by labs 06/2023  Pheochromocytoma Screened     - Comments 11/2023 plasma metanephrines ordered  Cushing's Syndrome N/A     - Comments non cushinoid appearance  Coarctation of the Aorta N/A     - Comments BP symmetric  Compliance Screened    Relevant Labs/Studies:    Latest Ref Rng & Units 03/05/2024    9:13 AM 11/01/2023   10:28 AM 09/27/2023   10:23 AM  Basic Labs  Sodium 135 - 145 mEq/L 144  141  143   Potassium 3.5 - 5.1 mEq/L 4.5   4.1  4.1   Creatinine 0.40 - 1.20 mg/dL 8.50  8.60  8.70        Latest Ref Rng & Units 11/01/2023   10:28 AM 02/23/2023   10:05 AM  Thyroid    TSH 0.35 - 5.50 uIU/mL 2.97  2.20           01/19/2024   10:31 AM  Renovascular   Renal Artery US  Completed Yes      Home Medications    Current Outpatient Medications  Medication Sig Dispense Refill   hydrALAZINE  (APRESOLINE ) 50 MG tablet Take 1 tablet (50 mg total) by mouth 3 (three) times daily. 60 tablet 3   acetaminophen  (TYLENOL ) 500 MG tablet Take 500-1,000 mg by mouth every 6 (six) hours as needed for moderate pain.     atorvastatin  (LIPITOR) 20 MG tablet TAKE 1 TABLET BY MOUTH IN THE  EVENING 100 tablet 2   carvedilol  (COREG ) 25 MG tablet Take 1 tablet (25 mg total) by mouth 2 (two) times daily with a meal. 180 tablet 3   cetirizine (ZYRTEC) 10 MG tablet Take 10 mg by mouth daily as needed for allergies.     Cholecalciferol (VITAMIN D) 50 MCG (2000 UT) tablet Take 6,000 Units by mouth daily.     conjugated estrogens  (PREMARIN ) vaginal cream Place vaginally as directed. Use 2-3 times weekly  prnPlace 1 Applicatorful vaginally as directed. Use 2-3 times weekly  prn (Patient taking differently: Place vaginally as directed. 1 Applicatorful vaginally as directed. Use 2-3 times weekly  prn) 42 g 4   Cyanocobalamin  (B-12) 3000 MCG CAPS Take 3,000 mcg by mouth daily.     ferrous sulfate  325 (65 FE) MG tablet Take 1 tablet (325 mg total) by mouth every other day. (Patient taking differently: Take 325 mg by mouth daily.) 15 tablet 11   fluticasone (FLONASE) 50 MCG/ACT nasal spray Place 1 spray into both nostrils daily as needed for allergies or rhinitis.     latanoprost (XALATAN) 0.005 % ophthalmic solution Place 1 drop into both eyes at bedtime.     levothyroxine  (SYNTHROID ) 75 MCG tablet Take 1 tablet (75 mcg total) by mouth daily. 100 tablet 2   magnesium oxide (MAG-OX) 400 (240 Mg) MG tablet Take 800 mg by mouth daily.     metFORMIN   (GLUCOPHAGE -XR) 500 MG 24 hr tablet TAKE 3 TABLETS BY MOUTH DAILY 300 tablet 2   OVER THE COUNTER MEDICATION Take 1 tablet by mouth daily. Total Beets supplements  pantoprazole  (PROTONIX ) 40 MG tablet Take 40 mg by mouth daily.     spironolactone  (ALDACTONE ) 25 MG tablet TAKE 1 TABLET BY MOUTH DAILY 90 tablet 3   Turmeric 500 MG CAPS Take 1,000 mg by mouth daily.     valsartan  (DIOVAN ) 320 MG tablet Take 1 tablet (320 mg total) by mouth daily. 90 tablet 3   Current Facility-Administered Medications  Medication Dose Route Frequency Provider Last Rate Last Admin   betamethasone  acetate-betamethasone  sodium phosphate  (CELESTONE ) injection 3 mg  3 mg Intramuscular Once Evans, Brent M, DPM       betamethasone  acetate-betamethasone  sodium phosphate  (CELESTONE ) injection 3 mg  3 mg Intramuscular Once Janit Thresa HERO, DPM         Assessment & Plan   HYPERTENSION CONTROL Vitals:   03/19/24 1020 03/19/24 1031  BP: (!) 190/84 (!) 188/70    The patient's blood pressure is elevated above target today.  In order to address the patient's elevated BP: A new medication was prescribed today.      Essential hypertension Assessment: BP is uncontrolled in office BP 188/70 mmHg;  above the goal (<130/80). Patient concern edema secondary to doxazosin  Tolerates carvedilol , spironolactone  and valsartan  well, without any side effects Denies SOB, palpitation, chest pain, headaches,or swelling Reiterated the importance of regular exercise and low salt diet  Explained that stopping the medications will not likely cause her pressure to drop and that in order to protect her from organ damage, we need to find the right combination to get her pressure down.  Plan:  Stop taking doxazosin  Start taking hydralazine  50 mg bid Continue taking carvedilol  25 mg bid, spironolactone  25 mg daily and valsartan  320 mg daily Patient to keep record of BP readings with heart rate and report to us  at the next visit Patient  to follow up with me in 2 months  Labs ordered today:   none   Allean Mink PharmD CPP [facility]  [address] [place], [place] [address] [phone]

## 2024-03-19 ENCOUNTER — Ambulatory Visit (HOSPITAL_BASED_OUTPATIENT_CLINIC_OR_DEPARTMENT_OTHER): Admitting: Pharmacist Clinician (PhC)/ Clinical Pharmacy Specialist

## 2024-03-19 ENCOUNTER — Encounter (HOSPITAL_BASED_OUTPATIENT_CLINIC_OR_DEPARTMENT_OTHER): Payer: Self-pay | Admitting: Pharmacist Clinician (PhC)/ Clinical Pharmacy Specialist

## 2024-03-19 VITALS — BP 188/70 | HR 56 | Wt 143.4 lb

## 2024-03-19 DIAGNOSIS — I10 Essential (primary) hypertension: Secondary | ICD-10-CM | POA: Diagnosis not present

## 2024-03-19 MED ORDER — HYDRALAZINE HCL 50 MG PO TABS: 50.0000 mg | ORAL_TABLET | Freq: Three times a day (TID) | ORAL | 3 refills | Status: AC

## 2024-03-19 NOTE — Assessment & Plan Note (Signed)
 Assessment: BP is uncontrolled in office BP 188/70 mmHg;  above the goal (<130/80). Patient concern edema secondary to doxazosin  Tolerates carvedilol , spironolactone  and valsartan  well, without any side effects Denies SOB, palpitation, chest pain, headaches,or swelling Reiterated the importance of regular exercise and low salt diet  Explained that stopping the medications will not likely cause her pressure to drop and that in order to protect her from organ damage, we need to find the right combination to get her pressure down.  Plan:  Stop taking doxazosin  Start taking hydralazine  50 mg bid Continue taking carvedilol  25 mg bid, spironolactone  25 mg daily and valsartan  320 mg daily Patient to keep record of BP readings with heart rate and report to us  at the next visit Patient to follow up with me in 2 months  Labs ordered today:   none

## 2024-03-19 NOTE — Patient Instructions (Signed)
 Follow up appointment: Thursday Sept 11 at 11:30 am  Take your BP meds as follows:  STOP DOXAZOSIN  FOR NOW  START HYDRALAZINE  50 MG TWICE DAILY  Check your blood pressure at home daily (if able) and keep record of the readings.  Your blood pressure goal is <140/80 (for now)  To check your pressure at home you will need to:  1. Sit up in a chair, with feet flat on the floor and back supported. Do not cross your ankles or legs. 2. Rest your left arm so that the cuff is about heart level. If the cuff goes on your upper arm,  then just relax the arm on the table, arm of the chair or your lap. If you have a wrist cuff, we  suggest relaxing your wrist against your chest (think of it as Pledging the Flag with the  wrong arm).  3. Place the cuff snugly around your arm, about 1 inch above the crook of your elbow. The  cords should be inside the groove of your elbow.  4. Sit quietly, with the cuff in place, for about 5 minutes. After that 5 minutes press the power  button to start a reading. 5. Do not talk or move while the reading is taking place.  6. Record your readings on a sheet of paper. Although most cuffs have a memory, it is often  easier to see a pattern developing when the numbers are all in front of you.  7. You can repeat the reading after 1-3 minutes if it is recommended  Make sure your bladder is empty and you have not had caffeine or tobacco within the last 30 min  Always bring your blood pressure log with you to your appointments. If you have not brought your monitor in to be double checked for accuracy, please bring it to your next appointment.  You can find a list of quality blood pressure cuffs at [email or web address]  Important lifestyle changes to control high blood pressure  Intervention  Effect on the BP  Lose extra pounds and watch your waistline Weight loss is one of the most effective lifestyle changes for controlling blood pressure. If you're overweight or obese,  losing even a small amount of weight can help reduce blood pressure. Blood pressure might go down by about 1 millimeter of mercury (mm Hg) with each kilogram (about 2.2 pounds) of weight lost.  Exercise regularly As a general goal, aim for at least 30 minutes of moderate physical activity every day. Regular physical activity can lower high blood pressure by about 5 to 8 mm Hg.  Eat a healthy diet Eating a diet rich in whole grains, fruits, vegetables, and low-fat dairy products and low in saturated fat and cholesterol. A healthy diet can lower high blood pressure by up to 11 mm Hg.  Reduce salt (sodium) in your diet Even a small reduction of sodium in the diet can improve heart health and reduce high blood pressure by about 5 to 6 mm Hg.  Limit alcohol One drink equals 12 ounces of beer, 5 ounces of wine, or 1.5 ounces of 80-proof liquor.  Limiting alcohol to less than one drink a day for women or two drinks a day for men can help lower blood pressure by about 4 mm Hg.   If you have any questions or concerns please use My Chart to send questions or call the office at [phone]

## 2024-03-20 DIAGNOSIS — M9905 Segmental and somatic dysfunction of pelvic region: Secondary | ICD-10-CM | POA: Diagnosis not present

## 2024-03-20 DIAGNOSIS — M9903 Segmental and somatic dysfunction of lumbar region: Secondary | ICD-10-CM | POA: Diagnosis not present

## 2024-03-20 DIAGNOSIS — M9904 Segmental and somatic dysfunction of sacral region: Secondary | ICD-10-CM | POA: Diagnosis not present

## 2024-03-20 DIAGNOSIS — M5136 Other intervertebral disc degeneration, lumbar region with discogenic back pain only: Secondary | ICD-10-CM | POA: Diagnosis not present

## 2024-04-04 DIAGNOSIS — M5136 Other intervertebral disc degeneration, lumbar region with discogenic back pain only: Secondary | ICD-10-CM | POA: Diagnosis not present

## 2024-04-04 DIAGNOSIS — M9905 Segmental and somatic dysfunction of pelvic region: Secondary | ICD-10-CM | POA: Diagnosis not present

## 2024-04-04 DIAGNOSIS — M9904 Segmental and somatic dysfunction of sacral region: Secondary | ICD-10-CM | POA: Diagnosis not present

## 2024-04-04 DIAGNOSIS — M9903 Segmental and somatic dysfunction of lumbar region: Secondary | ICD-10-CM | POA: Diagnosis not present

## 2024-04-10 ENCOUNTER — Other Ambulatory Visit: Payer: Self-pay

## 2024-04-10 DIAGNOSIS — M51361 Other intervertebral disc degeneration, lumbar region with lower extremity pain only: Secondary | ICD-10-CM | POA: Diagnosis not present

## 2024-04-10 DIAGNOSIS — M9904 Segmental and somatic dysfunction of sacral region: Secondary | ICD-10-CM | POA: Diagnosis not present

## 2024-04-10 DIAGNOSIS — M9905 Segmental and somatic dysfunction of pelvic region: Secondary | ICD-10-CM | POA: Diagnosis not present

## 2024-04-10 DIAGNOSIS — M9903 Segmental and somatic dysfunction of lumbar region: Secondary | ICD-10-CM | POA: Diagnosis not present

## 2024-04-17 ENCOUNTER — Ambulatory Visit (INDEPENDENT_AMBULATORY_CARE_PROVIDER_SITE_OTHER): Admitting: Urology

## 2024-04-17 ENCOUNTER — Other Ambulatory Visit: Payer: Self-pay

## 2024-04-17 ENCOUNTER — Encounter: Payer: Self-pay | Admitting: Urology

## 2024-04-17 VITALS — BP 195/46 | HR 51

## 2024-04-17 DIAGNOSIS — N1832 Chronic kidney disease, stage 3b: Secondary | ICD-10-CM

## 2024-04-17 DIAGNOSIS — N2889 Other specified disorders of kidney and ureter: Secondary | ICD-10-CM | POA: Diagnosis not present

## 2024-04-17 DIAGNOSIS — I10 Essential (primary) hypertension: Secondary | ICD-10-CM

## 2024-04-17 DIAGNOSIS — E1165 Type 2 diabetes mellitus with hyperglycemia: Secondary | ICD-10-CM

## 2024-04-17 DIAGNOSIS — Z79899 Other long term (current) drug therapy: Secondary | ICD-10-CM

## 2024-04-17 LAB — URINALYSIS, ROUTINE W REFLEX MICROSCOPIC
Bilirubin, UA: NEGATIVE
Glucose, UA: NEGATIVE
Ketones, UA: NEGATIVE
Nitrite, UA: NEGATIVE
RBC, UA: NEGATIVE
Specific Gravity, UA: 1.01 (ref 1.005–1.030)
Urobilinogen, Ur: 0.2 mg/dL (ref 0.2–1.0)
pH, UA: 5.5 (ref 5.0–7.5)

## 2024-04-17 LAB — MICROSCOPIC EXAMINATION: Bacteria, UA: NONE SEEN

## 2024-04-17 MED ORDER — VALSARTAN 320 MG PO TABS: 320.0000 mg | ORAL_TABLET | Freq: Every day | ORAL | 3 refills | Status: AC

## 2024-04-17 NOTE — Progress Notes (Signed)
 04/17/2024 12:15 PM   Kerry Walters [birth year] [id number]  Referring provider: Charlett Apolinar POUR, MD [address] [place],  [place] [address]  Followup right renal mass   HPI: Kerry Walters is a 78yo here for followup for a right renal mass. She underwent MRI which showed no definitive renal mass. She has chronic mild right hydronephrosis and right renal atrophy.    PMH: Past Medical History:  Diagnosis Date   Allergy    Asthma due to environmental allergies    no inhaler   Diabetes mellitus without complication (HCC)    Diverticulosis of colon    GERD (gastroesophageal reflux disease)    History of esophageal dilatation    FOR STRICTURE IN 2005   History of hiatal hernia    History of hypercalcemia    SECONDARY TO HYPERPARATHYROID   History of hyperparathyroidism    PRIMARY--  S/P RIGHT PARATHYROIDECTOMY   History of kidney stones    History of recurrent UTIs    Hyperlipidemia    Hypertension    Hypothyroidism    Incomplete right bundle branch block    Iron deficiency anemia due to chronic blood loss 12/01/2021   Nephrolithiasis    RIGHT    Nocturia    PONV (postoperative nausea and vomiting)    Pre-diabetes    Sciatica of right side     Surgical History: Past Surgical History:  Procedure Laterality Date   APPENDECTOMY  1974   COLONOSCOPY WITH PROPOFOL  N/A 01/12/2023   Procedure: COLONOSCOPY WITH PROPOFOL ;  Surgeon: Wilhelmenia Aloha Raddle., MD;  Location: [facility];  Service: Gastroenterology;  Laterality: N/A;   CYSTOSCOPY WITH URETEROSCOPY AND STENT PLACEMENT Right 11/11/2014   Procedure: CYSTOSCOPY WITH URETEROSCOPY AND STENT PLACEMENT;  Surgeon: Morene LELON Salines, MD;  Location: [facility];  Service: Urology;  Laterality: Right;   ENDOSCOPIC MUCOSAL RESECTION N/A 01/12/2023   Procedure: ENDOSCOPIC MUCOSAL RESECTION;  Surgeon: Wilhelmenia Aloha Raddle., MD;  Location: [facility];  Service: Gastroenterology;  Laterality: N/A;    ESOPHAGOGASTRODUODENOSCOPY (EGD) WITH PROPOFOL  N/A 01/12/2023   Procedure: ESOPHAGOGASTRODUODENOSCOPY (EGD) WITH PROPOFOL ;  Surgeon: Wilhelmenia Aloha Raddle., MD;  Location: [facility];  Service: Gastroenterology;  Laterality: N/A;   EXCISION URETHRAL CARUNCLE  1981   Open procedure   HEMOSTASIS CLIP PLACEMENT  01/12/2023   Procedure: HEMOSTASIS CLIP PLACEMENT;  Surgeon: Wilhelmenia Aloha Raddle., MD;  Location: [facility];  Service: Gastroenterology;;   HEMOSTASIS CONTROL  01/12/2023   Procedure: HEMOSTASIS CONTROL;  Surgeon: Wilhelmenia Aloha Raddle., MD;  Location: [facility];  Service: Gastroenterology;;   KNEE ARTHROSCOPY Left april 2010   POLYPECTOMY  01/12/2023   Procedure: POLYPECTOMY;  Surgeon: Wilhelmenia Aloha Raddle., MD;  Location: [facility];  Service: Gastroenterology;;  gastric and colon   REMOVAL RIP  1973   First Rib on Right side-- for impingement   RIGHT INFERIOR PARATHYROIDECTOMY, MINIMALLY INVASIVE  08-14-2008   SUBMUCOSAL LIFTING INJECTION  01/12/2023   Procedure: SUBMUCOSAL LIFTING INJECTION;  Surgeon: Wilhelmenia Aloha Raddle., MD;  Location: [facility];  Service: Gastroenterology;;   TONSILLECTOMY  as child   TOTAL KNEE ARTHROPLASTY Left 11-17-2009   TUBAL LIGATION  1973   VAGINAL HYSTERECTOMY  1995   w/ Bilateral Salpingoophorectomy    Home Medications:  Allergies as of 04/17/2024       Reactions   Contrast Media [iodinated Contrast Media] Shortness Of Breath   Pt states in the 80's at Urologist visit had asthma attack reaction to IVP dye. Told not to have it  again.    Amlodipine  Other (See Comments)   Swelling    Clonidine  Derivatives Swelling, Other (See Comments)   Dry mouth, severe drowsiness.    Codeine Nausea Only   Shrimp [shellfish Allergy] Swelling   Only Shrimp   Topamax [topiramate] Rash        Medication List        Accurate as of April 17, 2024 12:15 PM. If you have any questions, ask your nurse or doctor.          acetaminophen   500 MG tablet Commonly known as: TYLENOL  Take 500-1,000 mg by mouth every 6 (six) hours as needed for moderate pain.   atorvastatin  20 MG tablet Commonly known as: LIPITOR TAKE 1 TABLET BY MOUTH IN THE  EVENING   B-12 3000 MCG Caps Take 3,000 mcg by mouth daily.   carvedilol  25 MG tablet Commonly known as: COREG  Take 1 tablet (25 mg total) by mouth 2 (two) times daily with a meal.   cetirizine 10 MG tablet Commonly known as: ZYRTEC Take 10 mg by mouth daily as needed for allergies.   ferrous sulfate  325 (65 FE) MG tablet Take 1 tablet (325 mg total) by mouth every other day. What changed: when to take this   fluticasone 50 MCG/ACT nasal spray Commonly known as: FLONASE Place 1 spray into both nostrils daily as needed for allergies or rhinitis.   hydrALAZINE  50 MG tablet Commonly known as: APRESOLINE  Take 1 tablet (50 mg total) by mouth 3 (three) times daily.   latanoprost 0.005 % ophthalmic solution Commonly known as: XALATAN Place 1 drop into both eyes at bedtime.   levothyroxine  75 MCG tablet Commonly known as: SYNTHROID  Take 1 tablet (75 mcg total) by mouth daily.   magnesium oxide 400 (240 Mg) MG tablet Commonly known as: MAG-OX Take 800 mg by mouth daily.   metFORMIN  500 MG 24 hr tablet Commonly known as: GLUCOPHAGE -XR TAKE 3 TABLETS BY MOUTH DAILY   OVER THE COUNTER MEDICATION Take 1 tablet by mouth daily. Total Beets supplements   pantoprazole  40 MG tablet Commonly known as: PROTONIX  Take 40 mg by mouth daily.   Premarin  vaginal cream Generic drug: conjugated estrogens  Place vaginally as directed. Use 2-3 times weekly  prnPlace 1 Applicatorful vaginally as directed. Use 2-3 times weekly  prn What changed: additional instructions   spironolactone  25 MG tablet Commonly known as: ALDACTONE  TAKE 1 TABLET BY MOUTH DAILY   Turmeric 500 MG Caps Take 1,000 mg by mouth daily.   valsartan  320 MG tablet Commonly known as: DIOVAN  Take 1 tablet (320 mg  total) by mouth daily.   Vitamin D 50 MCG (2000 UT) tablet Take 6,000 Units by mouth daily.        Allergies:  Allergies  Allergen Reactions   Contrast Media [Iodinated Contrast Media] Shortness Of Breath    Pt states in the 80's at Urologist visit had asthma attack reaction to IVP dye. Told not to have it again.    Amlodipine  Other (See Comments)    Swelling    Clonidine  Derivatives Swelling and Other (See Comments)    Dry mouth, severe drowsiness.    Codeine Nausea Only   Shrimp [Shellfish Allergy] Swelling    Only Shrimp   Topamax [Topiramate] Rash    Family History: Family History  Problem Relation Age of Onset   Cancer Father        brain   Cancer Son        liver  Diabetes Sister    Schizophrenia Son    Colon cancer Neg Hx    Colon polyps Neg Hx    Esophageal cancer Neg Hx    Stomach cancer Neg Hx    Pancreatic cancer Neg Hx    Rectal cancer Neg Hx     Social History:  reports that she has never smoked. She has never used smokeless tobacco. She reports that she does not drink alcohol and does not use drugs.  ROS: All other review of systems were reviewed and are negative except what is noted above in HPI  Physical Exam: BP (!) 195/46 Comment: md made aware. pt states this is normal for her. she checks it at home and its regular. Patient states her pcp is aware.  Pulse (!) 51   Constitutional:  Alert and oriented, No acute distress. HEENT: [place] AT, moist mucus membranes.  Trachea midline, no masses. Cardiovascular: No clubbing, cyanosis, or edema. Respiratory: Normal respiratory effort, no increased work of breathing. GI: Abdomen is soft, nontender, nondistended, no abdominal masses GU: No CVA tenderness.  Lymph: No cervical or inguinal lymphadenopathy. Skin: No rashes, bruises or suspicious lesions. Neurologic: Grossly intact, no focal deficits, moving all 4 extremities. Psychiatric: Normal mood and affect.  Laboratory Data: Lab Results  Component  Value Date   WBC 7.0 03/05/2024   HGB 10.6 (L) 03/05/2024   HCT 31.4 (L) 03/05/2024   MCV 88.2 03/05/2024   PLT 242.0 03/05/2024    Lab Results  Component Value Date   CREATININE 1.49 (H) 03/05/2024    No results found for: PSA  No results found for: TESTOSTERONE  Lab Results  Component Value Date   HGBA1C 6.0 03/05/2024    Urinalysis    Component Value Date/Time   COLORURINE YELLOW 09/14/2021 1259   APPEARANCEUR Clear 01/26/2024 1021   LABSPEC 1.010 09/14/2021 1259   PHURINE 7.0 09/14/2021 1259   GLUCOSEU Negative 01/26/2024 1021   GLUCOSEU NEGATIVE 09/14/2021 1259   HGBUR SMALL (A) 09/14/2021 1259   HGBUR negative 01/26/2010 0921   BILIRUBINUR Negative 01/26/2024 1021   KETONESUR NEGATIVE 09/14/2021 1259   PROTEINUR 3+ (A) 01/26/2024 1021   PROTEINUR NEGATIVE 11/19/2009 1442   UROBILINOGEN 0.2 10/11/2023 1157   UROBILINOGEN 0.2 09/14/2021 1259   NITRITE Negative 01/26/2024 1021   NITRITE NEGATIVE 09/14/2021 1259   LEUKOCYTESUR 1+ (A) 01/26/2024 1021   LEUKOCYTESUR LARGE (A) 09/14/2021 1259    Lab Results  Component Value Date   LABMICR See below: 01/26/2024   WBCUA 6-10 (A) 01/26/2024   LABEPIT 0-10 01/26/2024   MUCUS Presence of (A) 09/14/2021   BACTERIA None seen 01/26/2024    Pertinent Imaging: MRI 02/23/2024: Images reviewed and discussed with the patient  Results for orders placed during the hospital encounter of 08/27/07  DG Abd 1 View  Narrative This report is delayed due to PACS failure. Clinical Data: Preoperative exam. Right renal calculi. ABDOMEN - 1 VIEW: Findings: There are several stones overlying the lower pole of the right kidney. One measures approximately 8 mm in diameter and another is approximately 6 mm in diameter. There is a third stone measuring approximately 4 mm in diameter. No stones are seen overlying the left kidney. There are several calcifications in the pelvis which are probably phleboliths. Mild degenerative changes  are present in the lower lumbar spine.  Impression Multiple calculi in the lower pole of the right kidney.  Provider: Ronal Sake  No results found for this or any previous visit.  No  results found for this or any previous visit.  No results found for this or any previous visit.  Results for orders placed during the hospital encounter of 11/17/23  US  RENAL  Narrative CLINICAL DATA:  Chronic uremia  EXAM: RENAL / URINARY TRACT ULTRASOUND COMPLETE  COMPARISON:  Abdomen ultrasound Jan 14, 2021, CT abdomen September 22, 2022  FINDINGS: Right Kidney:  Renal measurements: 10.3 x 3.7 x 5.9 cm = volume: 117 mL. Increased echotexture. No hydronephrosis. 1.3 x 1.1 x 1.1 cm simple cyst identified, no follow-up is recommended. 1.9 x 1.9 x 1.9 cm hypoechoic mass in the midpole right kidney.  Left Kidney:  Renal measurements: 10.6 x 5.7 x 4.5 cm = volume: 144 mL. Echogenicity within normal limits. No mass or hydronephrosis visualized.  Bladder:  Appears normal for degree of bladder distention. Prevoid volume of 85 cc, postvoid volume of 0 cc.  Other:  None.  IMPRESSION: 1. 1.9 cm hypoechoic mass in the midpole right kidney. Renal cell carcinoma is not excluded. Recommend further evaluation with renal protocol CT or MRI. 2. Increased echotexture of the right kidney, nonspecific but can be seen in medical renal disease.   Electronically Signed By: Craig Farr M.D. On: 11/23/2023 13:43  No results found for this or any previous visit.  No results found for this or any previous visit.  No results found for this or any previous visit.   Assessment & Plan:    1. Renal mass (Primary) -No renal mass seen on MRI. Followup PRN - Urinalysis, Routine w reflex microscopic   No follow-ups on file.  Belvie Clara, MD  [facility] 

## 2024-05-01 DIAGNOSIS — M9905 Segmental and somatic dysfunction of pelvic region: Secondary | ICD-10-CM | POA: Diagnosis not present

## 2024-05-01 DIAGNOSIS — M51361 Other intervertebral disc degeneration, lumbar region with lower extremity pain only: Secondary | ICD-10-CM | POA: Diagnosis not present

## 2024-05-01 DIAGNOSIS — M9903 Segmental and somatic dysfunction of lumbar region: Secondary | ICD-10-CM | POA: Diagnosis not present

## 2024-05-01 DIAGNOSIS — M9904 Segmental and somatic dysfunction of sacral region: Secondary | ICD-10-CM | POA: Diagnosis not present

## 2024-05-06 DIAGNOSIS — M51361 Other intervertebral disc degeneration, lumbar region with lower extremity pain only: Secondary | ICD-10-CM | POA: Diagnosis not present

## 2024-05-06 DIAGNOSIS — M9905 Segmental and somatic dysfunction of pelvic region: Secondary | ICD-10-CM | POA: Diagnosis not present

## 2024-05-06 DIAGNOSIS — M9903 Segmental and somatic dysfunction of lumbar region: Secondary | ICD-10-CM | POA: Diagnosis not present

## 2024-05-06 DIAGNOSIS — M9904 Segmental and somatic dysfunction of sacral region: Secondary | ICD-10-CM | POA: Diagnosis not present

## 2024-05-08 NOTE — Progress Notes (Signed)
 Office Visit    Patient Name: Kerry Walters Date of Encounter: 05/09/2024  Primary Care Provider:  Charlett Apolinar POUR, MD Primary Cardiologist:  Shelda Bruckner, MD  Chief Complaint    Hypertension - Advanced hypertension clinic  Past Medical History   DM2 7/25 A1c 6, on metformin  1500 mg daily  CKD Stage IIIb  SCr 1.49, GFR 33  hypothyroid 3/25 TSH WNL on levothyroxine  75   HLD 7/25 LDL 68 on atorvastatin  20 mg daily  gout 7/25 uric acid 7.8 (avoid diuretics for now)    Allergies  Allergen Reactions   Contrast Media [Iodinated Contrast Media] Shortness Of Breath    Pt states in the 80's at Urologist visit had asthma attack reaction to IVP dye. Told not to have it again.    Amlodipine  Other (See Comments)    Swelling    Clonidine  Derivatives Swelling and Other (See Comments)    Dry mouth, severe drowsiness.    Codeine Nausea Only   Shrimp [Shellfish Allergy] Swelling    Only Shrimp   Topamax [Topiramate] Rash    History of Present Illness    Kerry Walters is a 78 y.o. female patient who was referred to the [facility] by Dr. Bruckner.  She was seen by Reche Finder in May, with a pressure of 180/80.  She was going through an emotional hurdle, as her husband died suddenly in 01/05/2024 after 62 years of marriage.  Caitlin further increased her doxazosin  to 4 mg each night.  She does have a history of gout, which limits our medication options some.   Secondary workup has been unremarkable.    I saw her in July, pressure still elevated at 188/70 and due to her concerns about doxazosin  (edema) we discontinued that and instead gave her hydralazine  50 mg twice daily.  She has complained of ankle edema for some time (before starting hydralazine ).   Today she is in the office for follow up.  Believes LEE is caused by her medications despite explanation that venous insufficiency can occur outside medication use.  Still only randomly checking blood pressure and  thinking medications are adversely affecting her readings.    Blood Pressure Goal:  130/80  Current Medications: carvedilol  25 mg bid, hydralazine  50 mg bid, spironolactone  25 mg daily, valsartan  320 mg daily  Previously tried:   amlodipine  - edema   Clonidine  - edema Social Hx:      Tobacco:  no  Alcohol:  no  Caffeine:  coffee in the am, half sweet tea, occasional diet soda   Diet:   more home cooked meals; doesn't  eat much; usually 2 meals per day; breakfast is eggs, no more bacon, now more Cheerios, grits, bagels;  most mornings; no pastas, some meats or beans for protein, some vegetables, not regularly   Exercise: yard work, [employer] cookies  Home BP readings: doesn't check regularly - only random, no readings with her   Systolic 190's, diastolic WNL  Accessory Clinical Findings    Lab Results  Component Value Date   CREATININE 1.49 (H) 03/05/2024   BUN 23 03/05/2024   NA 144 03/05/2024   K 4.5 03/05/2024   CL 109 03/05/2024   CO2 29 03/05/2024   Lab Results  Component Value Date   ALT 14 03/05/2024   AST 20 03/05/2024   ALKPHOS 57 03/05/2024   BILITOT 0.4 03/05/2024   Lab Results  Component Value Date   HGBA1C 6.0 03/05/2024    Screening for  Secondary Hypertension:      12/21/2023    3:00 PM  Causes  Drugs/Herbals Screened     - Comments no OTC agents, limited caffeine, estrogen cream per OB  Renovascular HTN Screened     - Comments 11/2023 renal duplex ordered  Sleep Apnea Screened     - Comments mild OSA, declines CPAP  Thyroid  Disease Screened     - Comments managed by PCP  Hyperaldosteronism Screened     - Comments no hyperaldosteronism by labs 06/2023  Pheochromocytoma Screened     - Comments 11/2023 plasma metanephrines ordered  Cushing's Syndrome N/A     - Comments non cushinoid appearance  Coarctation of the Aorta N/A     - Comments BP symmetric  Compliance Screened    Relevant Labs/Studies:    Latest Ref Rng & Units 03/05/2024    9:13 AM  11/01/2023   10:28 AM 09/27/2023   10:23 AM  Basic Labs  Sodium 135 - 145 mEq/L 144  141  143   Potassium 3.5 - 5.1 mEq/L 4.5  4.1  4.1   Creatinine 0.40 - 1.20 mg/dL 8.50  8.60  8.70        Latest Ref Rng & Units 11/01/2023   10:28 AM 02/23/2023   10:05 AM  Thyroid    TSH 0.35 - 5.50 uIU/mL 2.97  2.20           01/19/2024   10:31 AM  Renovascular   Renal Artery US  Completed Yes      Home Medications    Current Outpatient Medications  Medication Sig Dispense Refill   spironolactone  (ALDACTONE ) 25 MG tablet Take 1.5 tablets (37.5 mg total) by mouth daily. 135 tablet 3   acetaminophen  (TYLENOL ) 500 MG tablet Take 500-1,000 mg by mouth every 6 (six) hours as needed for moderate pain.     atorvastatin  (LIPITOR) 20 MG tablet TAKE 1 TABLET BY MOUTH IN THE  EVENING 100 tablet 2   carvedilol  (COREG ) 25 MG tablet Take 1 tablet (25 mg total) by mouth 2 (two) times daily with a meal. 180 tablet 3   cetirizine (ZYRTEC) 10 MG tablet Take 10 mg by mouth daily as needed for allergies.     Cholecalciferol (VITAMIN D) 50 MCG (2000 UT) tablet Take 6,000 Units by mouth daily.     conjugated estrogens  (PREMARIN ) vaginal cream Place vaginally as directed. Use 2-3 times weekly  prnPlace 1 Applicatorful vaginally as directed. Use 2-3 times weekly  prn (Patient taking differently: Place vaginally as directed. 1 Applicatorful vaginally as directed. Use 2-3 times weekly  prn) 42 g 4   Cyanocobalamin  (B-12) 3000 MCG CAPS Take 3,000 mcg by mouth daily.     ferrous sulfate  325 (65 FE) MG tablet Take 1 tablet (325 mg total) by mouth every other day. (Patient taking differently: Take 325 mg by mouth daily.) 15 tablet 11   fluticasone (FLONASE) 50 MCG/ACT nasal spray Place 1 spray into both nostrils daily as needed for allergies or rhinitis.     hydrALAZINE  (APRESOLINE ) 50 MG tablet Take 1 tablet (50 mg total) by mouth 3 (three) times daily. 60 tablet 3   latanoprost (XALATAN) 0.005 % ophthalmic solution Place 1 drop  into both eyes at bedtime.     levothyroxine  (SYNTHROID ) 75 MCG tablet Take 1 tablet (75 mcg total) by mouth daily. 100 tablet 2   magnesium oxide (MAG-OX) 400 (240 Mg) MG tablet Take 800 mg by mouth daily.     metFORMIN  (GLUCOPHAGE -XR) 500  MG 24 hr tablet TAKE 3 TABLETS BY MOUTH DAILY 300 tablet 2   OVER THE COUNTER MEDICATION Take 1 tablet by mouth daily. Total Beets supplements     pantoprazole  (PROTONIX ) 40 MG tablet Take 40 mg by mouth daily.     Turmeric 500 MG CAPS Take 1,000 mg by mouth daily.     valsartan  (DIOVAN ) 320 MG tablet Take 1 tablet (320 mg total) by mouth daily. 90 tablet 3   Current Facility-Administered Medications  Medication Dose Route Frequency Provider Last Rate Last Admin   betamethasone  acetate-betamethasone  sodium phosphate  (CELESTONE ) injection 3 mg  3 mg Intramuscular Once Evans, Brent M, DPM       betamethasone  acetate-betamethasone  sodium phosphate  (CELESTONE ) injection 3 mg  3 mg Intramuscular Once Janit Thresa HERO, DPM         Assessment & Plan   HYPERTENSION CONTROL Vitals:   05/09/24 1130 05/09/24 1156  BP: (!) 211/61 (!) 190/68    The patient's blood pressure is elevated above target today.  In order to address the patient's elevated BP: A current anti-hypertensive medication was adjusted today.; Blood pressure will be monitored at home to determine if medication changes need to be made.      Essential hypertension Assessment: BP is uncontrolled in office BP 190/68 mmHg;  above the goal (<130/80). Tolerates carvedilol  25 mg bid, hydralazine  50 mg bid, spironolactone  25 mg daily, valsartan  320 mg daily well, without any side effects Denies SOB, palpitation, chest pain, headaches,or swelling Reiterated the importance of regular exercise and low salt diet   Plan:  Increase spironolactone  to 37.5 mg once daily Continue taking carvedilol  25 mg bid, hydralazine  50 mg bid, valsartan  320 mg daily Patient to keep record of BP readings with heart rate  and report to us  at the next visit Patient to follow up with me in October  Labs ordered today:  BMET - notes she is due for nephrology labs in 2-3 weeks, will let them draw  Allean Mink PharmD CPP [facility]  [address] [place], [place] [address] [phone]

## 2024-05-09 ENCOUNTER — Ambulatory Visit (HOSPITAL_BASED_OUTPATIENT_CLINIC_OR_DEPARTMENT_OTHER): Admitting: Pharmacist Clinician (PhC)/ Clinical Pharmacy Specialist

## 2024-05-09 ENCOUNTER — Encounter (HOSPITAL_BASED_OUTPATIENT_CLINIC_OR_DEPARTMENT_OTHER): Payer: Self-pay | Admitting: Pharmacist Clinician (PhC)/ Clinical Pharmacy Specialist

## 2024-05-09 VITALS — BP 190/68 | Wt 137.4 lb

## 2024-05-09 DIAGNOSIS — I10 Essential (primary) hypertension: Secondary | ICD-10-CM | POA: Diagnosis not present

## 2024-05-09 MED ORDER — SPIRONOLACTONE 25 MG PO TABS: 37.5000 mg | ORAL_TABLET | Freq: Every day | ORAL | 3 refills | Status: AC

## 2024-05-09 NOTE — Assessment & Plan Note (Signed)
 Assessment: BP is uncontrolled in office BP 190/68 mmHg;  above the goal (<130/80). Tolerates carvedilol  25 mg bid, hydralazine  50 mg bid, spironolactone  25 mg daily, valsartan  320 mg daily well, without any side effects Denies SOB, palpitation, chest pain, headaches,or swelling Reiterated the importance of regular exercise and low salt diet   Plan:  Increase spironolactone  to 37.5 mg once daily Continue taking carvedilol  25 mg bid, hydralazine  50 mg bid, valsartan  320 mg daily Patient to keep record of BP readings with heart rate and report to us  at the next visit Patient to follow up with me in October  Labs ordered today:  BMET - notes she is due for nephrology labs in 2-3 weeks, will let them draw

## 2024-05-09 NOTE — Patient Instructions (Signed)
 Follow up appointment: October 7 at 11:30  Go to the lab in 3 weeks - your labs for kidney MD will be fine  Take your BP meds as follows:  increase spironolactone  to 1.5 tablets (37.5 mg) once daily  Check your blood pressure at home 3-4 days per week and keep record of the readings.  Your blood pressure goal is < 140/80  To check your pressure at home you will need to:  1. Sit up in a chair, with feet flat on the floor and back supported. Do not cross your ankles or legs. 2. Rest your left arm so that the cuff is about heart level. If the cuff goes on your upper arm,  then just relax the arm on the table, arm of the chair or your lap. If you have a wrist cuff, we  suggest relaxing your wrist against your chest (think of it as Pledging the Flag with the  wrong arm).  3. Place the cuff snugly around your arm, about 1 inch above the crook of your elbow. The  cords should be inside the groove of your elbow.  4. Sit quietly, with the cuff in place, for about 5 minutes. After that 5 minutes press the power  button to start a reading. 5. Do not talk or move while the reading is taking place.  6. Record your readings on a sheet of paper. Although most cuffs have a memory, it is often  easier to see a pattern developing when the numbers are all in front of you.  7. You can repeat the reading after 1-3 minutes if it is recommended  Make sure your bladder is empty and you have not had caffeine or tobacco within the last 30 min  Always bring your blood pressure log with you to your appointments. If you have not brought your monitor in to be double checked for accuracy, please bring it to your next appointment.  You can find a list of quality blood pressure cuffs at [email or web address]  Important lifestyle changes to control high blood pressure  Intervention  Effect on the BP  Lose extra pounds and watch your waistline Weight loss is one of the most effective lifestyle changes for controlling  blood pressure. If you're overweight or obese, losing even a small amount of weight can help reduce blood pressure. Blood pressure might go down by about 1 millimeter of mercury (mm Hg) with each kilogram (about 2.2 pounds) of weight lost.  Exercise regularly As a general goal, aim for at least 30 minutes of moderate physical activity every day. Regular physical activity can lower high blood pressure by about 5 to 8 mm Hg.  Eat a healthy diet Eating a diet rich in whole grains, fruits, vegetables, and low-fat dairy products and low in saturated fat and cholesterol. A healthy diet can lower high blood pressure by up to 11 mm Hg.  Reduce salt (sodium) in your diet Even a small reduction of sodium in the diet can improve heart health and reduce high blood pressure by about 5 to 6 mm Hg.  Limit alcohol One drink equals 12 ounces of beer, 5 ounces of wine, or 1.5 ounces of 80-proof liquor.  Limiting alcohol to less than one drink a day for women or two drinks a day for men can help lower blood pressure by about 4 mm Hg.   If you have any questions or concerns please use My Chart to send questions or call the office at [phone]

## 2024-05-14 DIAGNOSIS — M51361 Other intervertebral disc degeneration, lumbar region with lower extremity pain only: Secondary | ICD-10-CM | POA: Diagnosis not present

## 2024-05-14 DIAGNOSIS — M9905 Segmental and somatic dysfunction of pelvic region: Secondary | ICD-10-CM | POA: Diagnosis not present

## 2024-05-14 DIAGNOSIS — M9903 Segmental and somatic dysfunction of lumbar region: Secondary | ICD-10-CM | POA: Diagnosis not present

## 2024-05-14 DIAGNOSIS — M9904 Segmental and somatic dysfunction of sacral region: Secondary | ICD-10-CM | POA: Diagnosis not present

## 2024-05-20 ENCOUNTER — Ambulatory Visit: Payer: Self-pay

## 2024-05-20 DIAGNOSIS — M9904 Segmental and somatic dysfunction of sacral region: Secondary | ICD-10-CM | POA: Diagnosis not present

## 2024-05-20 DIAGNOSIS — M9903 Segmental and somatic dysfunction of lumbar region: Secondary | ICD-10-CM | POA: Diagnosis not present

## 2024-05-20 DIAGNOSIS — M51361 Other intervertebral disc degeneration, lumbar region with lower extremity pain only: Secondary | ICD-10-CM | POA: Diagnosis not present

## 2024-05-20 DIAGNOSIS — M9905 Segmental and somatic dysfunction of pelvic region: Secondary | ICD-10-CM | POA: Diagnosis not present

## 2024-05-20 NOTE — Telephone Encounter (Signed)
 FYI Only or Action Required?: Action required by provider: request for appointment and update on patient condition.  Patient was last seen in primary care on 03/12/2024 by Panosh, Kerry POUR, MD.  Called Nurse Triage reporting Extremity Weakness and Hypertension.  Symptoms began Thursday (05/16/24) in the afternoon.  Interventions attempted: Prescription medications: patient states she is on 4 blood pressure medications.  Symptoms are: left head, arm , leg weakness completely resolved. Hypertension (190/75 yesterday), intermittent headache.  Triage Disposition: Go to ED Now (Notify PCP)- Refused  Patient/caregiver understands and will follow disposition?: No        Copied from CRM #[id number]. Topic: Clinical - Red Word Triage >> May 20, 2024  2:13 PM Burnard DEL wrote: Red Word that prompted transfer to Nurse Triage: possible mild stroke on Thursday. Reason for Disposition  [1] Weakness (i.e., paralysis, loss of muscle strength) of the face, arm / hand, or leg / foot on one side of the body AND [2] sudden onset AND [3] brief (now gone)  Answer Assessment - Initial Assessment Questions Patient states she does not want to sit around for hours waiting in the ED. RN educated on the risk for TIA and stroke, in addition to her elevated BP that it is recommended to go to the ED. She states she will think about it if her symptoms return but she is just calling in to get an appointment with her PCP. Attempt made to call [facility] for [facility], no answer and no voicemail option.  1. SYMPTOM: What is the main symptom you are concerned about? (e.g., weakness, numbness)     Weakness that went down left side of head, arm , legs and felt like mouth quivered. She states she couldn't stand, swimmy headed.  2. ONSET: When did this start? (e.g., minutes, hours, days; while sleeping)     Thursday, afternoon. Lasted 2 minutes.  3. LAST NORMAL: When was the last time you (the patient) were normal (no  symptoms)?     She states she is at normal ( no symptoms) currently. She states she had her back adjusted which helped.  4. PATTERN Does this come and go, or has it been constant since it started?  Is it present now?     Comes and goes, happened once. Not present now.  5. CARDIAC SYMPTOMS: Have you had any of the following symptoms: chest pain, difficulty breathing, palpitations?     Denies chest pain, difficulty breathing, palpitations.  6. NEUROLOGIC SYMPTOMS: Have you had any of the following symptoms: headache, dizziness, vision loss, double vision, changes in speech, unsteady on your feet?     She states she had a headache now and then but attributes it to allergies. She states at her age she is unsteady when she walks but that is not new or worsening. No vision loss, double vision, changes in speech.  7. OTHER SYMPTOMS: Do you have any other symptoms?     She also states she has been under a lot of stress since her husband passed away about 5 months ago; hypertension (195/70, yesterday. She states it has gotten as high as 220 SBP)  8. PREGNANCY: Is there any chance you are pregnant? When was your last menstrual period?     N/A.  Protocols used: Neurologic Deficit-A-AH

## 2024-05-28 NOTE — Telephone Encounter (Signed)
 If she feels needed please make her appt with me sooner than the NOvember time in record

## 2024-05-29 NOTE — Telephone Encounter (Signed)
 Attempted to follow up with pt. Left a voicemail to call us  back.

## 2024-05-30 DIAGNOSIS — M9903 Segmental and somatic dysfunction of lumbar region: Secondary | ICD-10-CM | POA: Diagnosis not present

## 2024-05-30 DIAGNOSIS — M9905 Segmental and somatic dysfunction of pelvic region: Secondary | ICD-10-CM | POA: Diagnosis not present

## 2024-05-30 DIAGNOSIS — M51361 Other intervertebral disc degeneration, lumbar region with lower extremity pain only: Secondary | ICD-10-CM | POA: Diagnosis not present

## 2024-05-30 DIAGNOSIS — M9904 Segmental and somatic dysfunction of sacral region: Secondary | ICD-10-CM | POA: Diagnosis not present

## 2024-05-31 NOTE — Telephone Encounter (Signed)
 Attempted to reach pt to follow up. Left a voicemail we are following up and to call us  back if needs to be seen sooner.

## 2024-06-04 ENCOUNTER — Ambulatory Visit: Attending: Cardiovascular Disease | Admitting: Pharmacist Clinician (PhC)/ Clinical Pharmacy Specialist

## 2024-06-04 ENCOUNTER — Encounter: Payer: Self-pay | Admitting: Pharmacist Clinician (PhC)/ Clinical Pharmacy Specialist

## 2024-06-04 VITALS — BP 174/64 | HR 58

## 2024-06-04 DIAGNOSIS — I10 Essential (primary) hypertension: Secondary | ICD-10-CM | POA: Diagnosis not present

## 2024-06-04 MED ORDER — CLONIDINE 0.1 MG/24HR TD PTWK: 0.1000 mg | MEDICATED_PATCH | TRANSDERMAL | 12 refills | Status: DC

## 2024-06-04 NOTE — Patient Instructions (Signed)
 Follow up appointment: Tuesday December 16 at 3:15 pm  Take your BP meds as follows: start clonidine  0.1 mg patch - apply once weekly  Continue with all other medications  Check your blood pressure at home daily (if able) and keep record of the readings.  Your blood pressure goal is <130/80  To check your pressure at home you will need to:  1. Sit up in a chair, with feet flat on the floor and back supported. Do not cross your ankles or legs. 2. Rest your left arm so that the cuff is about heart level. If the cuff goes on your upper arm,  then just relax the arm on the table, arm of the chair or your lap. If you have a wrist cuff, we  suggest relaxing your wrist against your chest (think of it as Pledging the Flag with the  wrong arm).  3. Place the cuff snugly around your arm, about 1 inch above the crook of your elbow. The  cords should be inside the groove of your elbow.  4. Sit quietly, with the cuff in place, for about 5 minutes. After that 5 minutes press the power  button to start a reading. 5. Do not talk or move while the reading is taking place.  6. Record your readings on a sheet of paper. Although most cuffs have a memory, it is often  easier to see a pattern developing when the numbers are all in front of you.  7. You can repeat the reading after 1-3 minutes if it is recommended  Make sure your bladder is empty and you have not had caffeine or tobacco within the last 30 min  Always bring your blood pressure log with you to your appointments. If you have not brought your monitor in to be double checked for accuracy, please bring it to your next appointment.  You can find a list of quality blood pressure cuffs at [email or web address]  Important lifestyle changes to control high blood pressure  Intervention  Effect on the BP  Lose extra pounds and watch your waistline Weight loss is one of the most effective lifestyle changes for controlling blood pressure. If you're overweight  or obese, losing even a small amount of weight can help reduce blood pressure. Blood pressure might go down by about 1 millimeter of mercury (mm Hg) with each kilogram (about 2.2 pounds) of weight lost.  Exercise regularly As a general goal, aim for at least 30 minutes of moderate physical activity every day. Regular physical activity can lower high blood pressure by about 5 to 8 mm Hg.  Eat a healthy diet Eating a diet rich in whole grains, fruits, vegetables, and low-fat dairy products and low in saturated fat and cholesterol. A healthy diet can lower high blood pressure by up to 11 mm Hg.  Reduce salt (sodium) in your diet Even a small reduction of sodium in the diet can improve heart health and reduce high blood pressure by about 5 to 6 mm Hg.  Limit alcohol One drink equals 12 ounces of beer, 5 ounces of wine, or 1.5 ounces of 80-proof liquor.  Limiting alcohol to less than one drink a day for women or two drinks a day for men can help lower blood pressure by about 4 mm Hg.   If you have any questions or concerns please use My Chart to send questions or call the office at [phone]

## 2024-06-04 NOTE — Progress Notes (Signed)
 Office Visit    Patient Name: Kerry Walters Date of Encounter: 06/04/2024  Primary Care Provider:  Charlett Apolinar POUR, MD Primary Cardiologist:  Shelda Bruckner, MD  Chief Complaint    Hypertension - Advanced hypertension clinic  Past Medical History   DM2 7/25 A1c 6, on metformin  1500 mg daily  CKD Stage IIIb  SCr 1.49, GFR 33  hypothyroid 3/25 TSH WNL on levothyroxine  75   HLD 7/25 LDL 68 on atorvastatin  20 mg daily  gout 7/25 uric acid 7.8 (avoid diuretics for now)    Allergies  Allergen Reactions   Contrast Media [Iodinated Contrast Media] Shortness Of Breath    Pt states in the 80's at Urologist visit had asthma attack reaction to IVP dye. Told not to have it again.    Amlodipine  Other (See Comments)    Swelling    Clonidine  Derivatives Swelling and Other (See Comments)    Dry mouth, severe drowsiness.    Codeine Nausea Only   Shrimp [Shellfish Allergy] Swelling    Only Shrimp   Topamax [Topiramate] Rash    History of Present Illness    Kerry Walters is a 78 y.o. female patient who was referred to the [facility] by Dr. Bruckner.  She was seen by Reche Finder in May, with a pressure of 180/80.  She was going through an emotional hurdle, as her husband died suddenly in 15-Dec-2023 after 62 years of marriage.  Caitlin further increased her doxazosin  to 4 mg each night.  She does have a history of gout, which limits our medication options some.   Secondary workup has been unremarkable.    I have seen her twice since then, and both times pressure still elevated in the 180-200 systolic range. In July hydralazine  was added and in September increased the spironolactone  to 37.5 mg daily.  She has labs ordered by nephrology that will be drawn today.  Today she is in the office for follow up.  Notes that the few times she checked her BP at home, was similar to previous - 180-200 range.  States that about 10 days ago she had a suspected TIA.  Her whole left  side went limp, dropped a glass of tea, couldn't stand and her head felt funny.  Lasted about 2-3 minutes then went away with no residual issues.  Called her PCP who recommended going to ED, but patient declined.  She did not check her BP after the episode.    Blood Pressure Goal:  130/80  Current Medications: carvedilol  25 mg bid, hydralazine  50 mg bid, spironolactone  37.5 mg daily, valsartan  320 mg daily  Previously tried:   amlodipine  - edema   Clonidine  - edema Social Hx:      Tobacco:  no  Alcohol:  no  Caffeine:  coffee in the am, half sweet tea, occasional diet soda   Diet:   more home cooked meals; doesn't  eat much; usually 2 meals per day; breakfast is eggs, no more bacon, now more Cheerios, grits, bagels;  most mornings; no pastas, some meats or beans for protein, some vegetables, not regularly   Exercise: yard work, [employer] cookies  Home BP readings: doesn't check regularly - only random, no readings with her   Systolic 190's, diastolic WNL  Accessory Clinical Findings    Lab Results  Component Value Date   CREATININE 1.49 (H) 03/05/2024   BUN 23 03/05/2024   NA 144 03/05/2024   K 4.5 03/05/2024   CL 109  03/05/2024   CO2 29 03/05/2024   Lab Results  Component Value Date   ALT 14 03/05/2024   AST 20 03/05/2024   ALKPHOS 57 03/05/2024   BILITOT 0.4 03/05/2024   Lab Results  Component Value Date   HGBA1C 6.0 03/05/2024    Screening for Secondary Hypertension:      12/21/2023    3:00 PM  Causes  Drugs/Herbals Screened     - Comments no OTC agents, limited caffeine, estrogen cream per OB  Renovascular HTN Screened     - Comments 11/2023 renal duplex ordered  Sleep Apnea Screened     - Comments mild OSA, declines CPAP  Thyroid  Disease Screened     - Comments managed by PCP  Hyperaldosteronism Screened     - Comments no hyperaldosteronism by labs 06/2023  Pheochromocytoma Screened     - Comments 11/2023 plasma metanephrines ordered  Cushing's Syndrome N/A      - Comments non cushinoid appearance  Coarctation of the Aorta N/A     - Comments BP symmetric  Compliance Screened    Relevant Labs/Studies:    Latest Ref Rng & Units 03/05/2024    9:13 AM 11/01/2023   10:28 AM 09/27/2023   10:23 AM  Basic Labs  Sodium 135 - 145 mEq/L 144  141  143   Potassium 3.5 - 5.1 mEq/L 4.5  4.1  4.1   Creatinine 0.40 - 1.20 mg/dL 8.50  8.60  8.70        Latest Ref Rng & Units 11/01/2023   10:28 AM 02/23/2023   10:05 AM  Thyroid    TSH 0.35 - 5.50 uIU/mL 2.97  2.20           01/19/2024   10:31 AM  Renovascular   Renal Artery US  Completed Yes      Home Medications    Current Outpatient Medications  Medication Sig Dispense Refill   cloNIDine  (CATAPRES  - DOSED IN MG/24 HR) 0.1 mg/24hr patch Place 1 patch (0.1 mg total) onto the skin once a week. 4 patch 12   acetaminophen  (TYLENOL ) 500 MG tablet Take 500-1,000 mg by mouth every 6 (six) hours as needed for moderate pain.     atorvastatin  (LIPITOR) 20 MG tablet TAKE 1 TABLET BY MOUTH IN THE  EVENING 100 tablet 2   carvedilol  (COREG ) 25 MG tablet Take 1 tablet (25 mg total) by mouth 2 (two) times daily with a meal. 180 tablet 3   cetirizine (ZYRTEC) 10 MG tablet Take 10 mg by mouth daily as needed for allergies.     Cholecalciferol (VITAMIN D) 50 MCG (2000 UT) tablet Take 6,000 Units by mouth daily.     conjugated estrogens  (PREMARIN ) vaginal cream Place vaginally as directed. Use 2-3 times weekly  prnPlace 1 Applicatorful vaginally as directed. Use 2-3 times weekly  prn (Patient taking differently: Place vaginally as directed. 1 Applicatorful vaginally as directed. Use 2-3 times weekly  prn) 42 g 4   Cyanocobalamin  (B-12) 3000 MCG CAPS Take 3,000 mcg by mouth daily.     ferrous sulfate  325 (65 FE) MG tablet Take 1 tablet (325 mg total) by mouth every other day. (Patient taking differently: Take 325 mg by mouth daily.) 15 tablet 11   fluticasone (FLONASE) 50 MCG/ACT nasal spray Place 1 spray into both nostrils  daily as needed for allergies or rhinitis.     hydrALAZINE  (APRESOLINE ) 50 MG tablet Take 1 tablet (50 mg total) by mouth 3 (three) times daily. 60 tablet 3  latanoprost (XALATAN) 0.005 % ophthalmic solution Place 1 drop into both eyes at bedtime.     levothyroxine  (SYNTHROID ) 75 MCG tablet Take 1 tablet (75 mcg total) by mouth daily. 100 tablet 2   magnesium oxide (MAG-OX) 400 (240 Mg) MG tablet Take 800 mg by mouth daily.     metFORMIN  (GLUCOPHAGE -XR) 500 MG 24 hr tablet TAKE 3 TABLETS BY MOUTH DAILY 300 tablet 2   OVER THE COUNTER MEDICATION Take 1 tablet by mouth daily. Total Beets supplements     pantoprazole  (PROTONIX ) 40 MG tablet Take 40 mg by mouth daily.     spironolactone  (ALDACTONE ) 25 MG tablet Take 1.5 tablets (37.5 mg total) by mouth daily. 135 tablet 3   Turmeric 500 MG CAPS Take 1,000 mg by mouth daily.     valsartan  (DIOVAN ) 320 MG tablet Take 1 tablet (320 mg total) by mouth daily. 90 tablet 3   Current Facility-Administered Medications  Medication Dose Route Frequency Provider Last Rate Last Admin   betamethasone  acetate-betamethasone  sodium phosphate  (CELESTONE ) injection 3 mg  3 mg Intramuscular Once Evans, Brent M, DPM       betamethasone  acetate-betamethasone  sodium phosphate  (CELESTONE ) injection 3 mg  3 mg Intramuscular Once Janit Thresa HERO, DPM         Assessment & Plan   HYPERTENSION CONTROL Vitals:   06/04/24 1108 06/04/24 1115  BP: (!) 180/76 (!) 174/64    The patient's blood pressure is elevated above target today.  In order to address the patient's elevated BP:       Essential hypertension Assessment: BP is uncontrolled in office BP 174/64 mmHg;  above the goal (<130/80). Tolerates carvedilol  25 mg bid, hydralazine  50 mg bid, spironolactone  37.5 mg daily, valsartan  320 mg daily well, without any side effects Possible TIA 10 days ago, no current concerns Stressed the need to add medication, as readings are in hypertensive urgency range - offered  clonidine  patch or trial of nifedipine (amlodipine  caused LEE) Denies SOB, palpitation, chest pain, headaches,or swelling Reiterated the importance of regular exercise and low salt diet   Plan:  Start taking clonidine  0.1 mg patch, apply once weekly Continue taking carvedilol  25 mg bid, hydralazine  50 mg bid, spironolactone  37.5 mg daily, valsartan  320 mg daily Patient to keep record of BP readings with heart rate and report to us  at the next visit Patient to follow up with me in 2 months  Labs ordered today:  none Gave GoodRx coupon, as her plan had $100/month copay  Allean Mink PharmD CPP [facility]  [address] [place], [place] [address] [phone]

## 2024-06-04 NOTE — Assessment & Plan Note (Addendum)
 Assessment: BP is uncontrolled in office BP 174/64 mmHg;  above the goal (<130/80). Tolerates carvedilol  25 mg bid, hydralazine  50 mg bid, spironolactone  37.5 mg daily, valsartan  320 mg daily well, without any side effects Possible TIA 10 days ago, no current concerns Stressed the need to add medication, as readings are in hypertensive urgency range - offered clonidine  patch or trial of nifedipine (amlodipine  caused LEE) Denies SOB, palpitation, chest pain, headaches,or swelling Reiterated the importance of regular exercise and low salt diet   Plan:  Start taking clonidine  0.1 mg patch, apply once weekly Continue taking carvedilol  25 mg bid, hydralazine  50 mg bid, spironolactone  37.5 mg daily, valsartan  320 mg daily Patient to keep record of BP readings with heart rate and report to us  at the next visit Patient to follow up with me in 2 months  Labs ordered today:  none Gave GoodRx coupon, as her plan had $100/month copay

## 2024-06-05 DIAGNOSIS — L918 Other hypertrophic disorders of the skin: Secondary | ICD-10-CM | POA: Diagnosis not present

## 2024-06-05 DIAGNOSIS — N189 Chronic kidney disease, unspecified: Secondary | ICD-10-CM | POA: Diagnosis not present

## 2024-06-05 DIAGNOSIS — E559 Vitamin D deficiency, unspecified: Secondary | ICD-10-CM | POA: Diagnosis not present

## 2024-06-05 DIAGNOSIS — L820 Inflamed seborrheic keratosis: Secondary | ICD-10-CM | POA: Diagnosis not present

## 2024-06-05 DIAGNOSIS — D631 Anemia in chronic kidney disease: Secondary | ICD-10-CM | POA: Diagnosis not present

## 2024-06-05 DIAGNOSIS — R809 Proteinuria, unspecified: Secondary | ICD-10-CM | POA: Diagnosis not present

## 2024-06-05 DIAGNOSIS — Z08 Encounter for follow-up examination after completed treatment for malignant neoplasm: Secondary | ICD-10-CM | POA: Diagnosis not present

## 2024-06-05 DIAGNOSIS — Z85828 Personal history of other malignant neoplasm of skin: Secondary | ICD-10-CM | POA: Diagnosis not present

## 2024-06-13 DIAGNOSIS — N261 Atrophy of kidney (terminal): Secondary | ICD-10-CM | POA: Diagnosis not present

## 2024-06-13 DIAGNOSIS — R809 Proteinuria, unspecified: Secondary | ICD-10-CM | POA: Diagnosis not present

## 2024-06-13 DIAGNOSIS — N184 Chronic kidney disease, stage 4 (severe): Secondary | ICD-10-CM | POA: Diagnosis not present

## 2024-06-13 DIAGNOSIS — Q631 Lobulated, fused and horseshoe kidney: Secondary | ICD-10-CM | POA: Diagnosis not present

## 2024-06-23 ENCOUNTER — Other Ambulatory Visit: Payer: Self-pay | Admitting: Family

## 2024-07-16 ENCOUNTER — Ambulatory Visit: Admitting: Internal Medicine

## 2024-07-16 VITALS — BP 170/60 | HR 50 | Temp 98.0°F | Ht 61.0 in | Wt 138.6 lb

## 2024-07-16 DIAGNOSIS — G479 Sleep disorder, unspecified: Secondary | ICD-10-CM

## 2024-07-16 DIAGNOSIS — Z79899 Other long term (current) drug therapy: Secondary | ICD-10-CM | POA: Diagnosis not present

## 2024-07-16 DIAGNOSIS — I10 Essential (primary) hypertension: Secondary | ICD-10-CM

## 2024-07-16 DIAGNOSIS — Z23 Encounter for immunization: Secondary | ICD-10-CM | POA: Diagnosis not present

## 2024-07-16 DIAGNOSIS — E1165 Type 2 diabetes mellitus with hyperglycemia: Secondary | ICD-10-CM

## 2024-07-16 DIAGNOSIS — N1832 Chronic kidney disease, stage 3b: Secondary | ICD-10-CM

## 2024-07-16 LAB — POCT GLYCOSYLATED HEMOGLOBIN (HGB A1C): Hemoglobin A1C: 5.8 % — AB (ref 4.0–5.6)

## 2024-07-16 MED ORDER — CARVEDILOL 25 MG PO TABS: 25.0000 mg | ORAL_TABLET | Freq: Two times a day (BID) | ORAL | 3 refills | Status: AC

## 2024-07-16 NOTE — Progress Notes (Unsigned)
 Chief Complaint  Patient presents with   Medical Management of Chronic Issues    HPI: Kerry Walters 78 y.o. come in for Chronic disease management  Under care from urology ( no renal mass)  nephrology for  ckd3    Hld Cards  had eval for secondary ht  but in advance  HT clinic  echo last mild mod MR  Need refill carvedilol   To begin lasix   hasn't got it yet.  Sleep  interrupted not a whole lot .   Still baking  for children and working   Grandson 33  helping out .  Sleep tried otc     Take mag 500    but  is difficult since death of her spouse   Dm no change  on metformin    ROS: See pertinent positives and negatives per HPI.  Past Medical History:  Diagnosis Date   Allergy    Asthma due to environmental allergies    no inhaler   Diabetes mellitus without complication (HCC)    Diverticulosis of colon    GERD (gastroesophageal reflux disease)    History of esophageal dilatation    FOR STRICTURE IN 2005   History of hiatal hernia    History of hypercalcemia    SECONDARY TO HYPERPARATHYROID   History of hyperparathyroidism    PRIMARY--  S/P RIGHT PARATHYROIDECTOMY   History of kidney stones    History of recurrent UTIs    Hyperlipidemia    Hypertension    Hypothyroidism    Incomplete right bundle branch block    Iron deficiency anemia due to chronic blood loss 12/01/2021   Nephrolithiasis    RIGHT    Nocturia    PONV (postoperative nausea and vomiting)    Pre-diabetes    Sciatica of right side     Family History  Problem Relation Age of Onset   Cancer Father        brain   Cancer Son        liver   Diabetes Sister    Schizophrenia Son    Colon cancer Neg Hx    Colon polyps Neg Hx    Esophageal cancer Neg Hx    Stomach cancer Neg Hx    Pancreatic cancer Neg Hx    Rectal cancer Neg Hx     Social History   Socioeconomic History   Marital status: Married    Spouse name: Not on file   Number of children: Not on file   Years of education: Not on file    Highest education level: Not on file  Occupational History   Not on file  Tobacco Use   Smoking status: Never   Smokeless tobacco: Never  Vaping Use   Vaping status: Never Used  Substance and Sexual Activity   Alcohol use: No   Drug use: No   Sexual activity: Not on file  Other Topics Concern   Not on file  Social History Narrative   Married   hh of 2   4 dogs   G2 P2   Water exercise.   Retired    [employer]    great grandchild          Social Drivers of Corporate Investment Banker Strain: Low Risk  (03/24/2020)   Overall Financial Resource Strain (CARDIA)    Difficulty of Paying Living Expenses: Not hard at all  Food Insecurity: No Food Insecurity (12/21/2023)   Hunger Vital Sign    Worried  About Running Out of Food in the Last Year: Never true    Ran Out of Food in the Last Year: Never true  Transportation Needs: No Transportation Needs (12/21/2023)   PRAPARE - Administrator, Civil Service (Medical): No    Lack of Transportation (Non-Medical): No  Physical Activity: Insufficiently Active (12/21/2023)   Exercise Vital Sign    Days of Exercise per Week: 2 days    Minutes of Exercise per Session: 30 min  Stress: Not on file  Social Connections: Not on file    Outpatient Medications Prior to Visit  Medication Sig Dispense Refill   acetaminophen  (TYLENOL ) 500 MG tablet Take 500-1,000 mg by mouth every 6 (six) hours as needed for moderate pain.     atorvastatin  (LIPITOR) 20 MG tablet TAKE 1 TABLET BY MOUTH IN THE  EVENING 100 tablet 2   cetirizine (ZYRTEC) 10 MG tablet Take 10 mg by mouth daily as needed for allergies.     Cholecalciferol (VITAMIN D) 50 MCG (2000 UT) tablet Take 6,000 Units by mouth daily.     cloNIDine  (CATAPRES  - DOSED IN MG/24 HR) 0.1 mg/24hr patch Place 1 patch (0.1 mg total) onto the skin once a week. 4 patch 12   conjugated estrogens  (PREMARIN ) vaginal cream Place vaginally as directed. Use 2-3 times weekly  prnPlace 1 Applicatorful  vaginally as directed. Use 2-3 times weekly  prn (Patient taking differently: Place vaginally as directed. 1 Applicatorful vaginally as directed. Use 2-3 times weekly  prn) 42 g 4   Cyanocobalamin  (B-12) 3000 MCG CAPS Take 3,000 mcg by mouth daily.     ferrous sulfate  325 (65 FE) MG tablet Take 1 tablet (325 mg total) by mouth every other day. (Patient taking differently: Take 325 mg by mouth daily.) 15 tablet 11   fluticasone (FLONASE) 50 MCG/ACT nasal spray Place 1 spray into both nostrils daily as needed for allergies or rhinitis.     hydrALAZINE  (APRESOLINE ) 50 MG tablet Take 1 tablet (50 mg total) by mouth 3 (three) times daily. 60 tablet 3   latanoprost (XALATAN) 0.005 % ophthalmic solution Place 1 drop into both eyes at bedtime.     levothyroxine  (SYNTHROID ) 75 MCG tablet Take 1 tablet (75 mcg total) by mouth daily. 100 tablet 2   magnesium oxide (MAG-OX) 400 (240 Mg) MG tablet Take 800 mg by mouth daily.     metFORMIN  (GLUCOPHAGE -XR) 500 MG 24 hr tablet TAKE 3 TABLETS BY MOUTH DAILY 300 tablet 2   OVER THE COUNTER MEDICATION Take 1 tablet by mouth daily. Total Beets supplements     pantoprazole  (PROTONIX ) 40 MG tablet Take 40 mg by mouth daily.     spironolactone  (ALDACTONE ) 25 MG tablet Take 1.5 tablets (37.5 mg total) by mouth daily. 135 tablet 3   Turmeric 500 MG CAPS Take 1,000 mg by mouth daily.     valsartan  (DIOVAN ) 320 MG tablet Take 1 tablet (320 mg total) by mouth daily. 90 tablet 3   carvedilol  (COREG ) 25 MG tablet Take 1 tablet (25 mg total) by mouth 2 (two) times daily with a meal. 180 tablet 3   Facility-Administered Medications Prior to Visit  Medication Dose Route Frequency Provider Last Rate Last Admin   betamethasone  acetate-betamethasone  sodium phosphate  (CELESTONE ) injection 3 mg  3 mg Intramuscular Once Evans, Brent M, DPM       betamethasone  acetate-betamethasone  sodium phosphate  (CELESTONE ) injection 3 mg  3 mg Intramuscular Once Janit Thresa HERO, DPM  EXAM:  BP (!) 170/60 (BP Location: Right Arm, Patient Position: Sitting, Cuff Size: Normal)   Pulse (!) 50   Temp 98 F (36.7 C) (Oral)   Ht 5' 1 (1.549 m)   Wt 138 lb 9.6 oz (62.9 kg)   SpO2 97%   BMI 26.19 kg/m   Body mass index is 26.19 kg/m.  GENERAL: vitals reviewed and listed above, alert, oriented, appears well hydrated and in no acute distress HEENT: atraumatic, conjunctiva  clear, no obvious abnormalities on inspection of external nose and ears  NECK: no obvious masses on inspection palpation  LUNGS: clear to auscultation bilaterally, no wheezes, rales or rhonchi, good air movement CV: HRRR, no clubbing cyanosis or  peripheral edema nl cap refill  MS: moves all extremities without noticeable focal  abnormality PSYCH: pleasant and cooperative, no obvious depression or anxiety Lab Results  Component Value Date   WBC 7.0 03/05/2024   HGB 10.6 (L) 03/05/2024   HCT 31.4 (L) 03/05/2024   PLT 242.0 03/05/2024   GLUCOSE 121 (H) 03/05/2024   CHOL 129 03/05/2024   TRIG 80.0 03/05/2024   HDL 44.80 03/05/2024   LDLDIRECT 101.0 03/04/2019   LDLCALC 68 03/05/2024   ALT 14 03/05/2024   AST 20 03/05/2024   NA 144 03/05/2024   K 4.5 03/05/2024   CL 109 03/05/2024   CREATININE 1.49 (H) 03/05/2024   BUN 23 03/05/2024   CO2 29 03/05/2024   TSH 2.97 11/01/2023   INR 1.12 11/19/2009   HGBA1C 5.8 (A) 07/16/2024   MICROALBUR 149.6 (H) 10/11/2023   BP Readings from Last 3 Encounters:  07/16/24 (!) 170/60  06/04/24 (!) 174/64  05/09/24 (!) 190/68    ASSESSMENT AND PLAN:  Discussed the following assessment and plan:  Essential hypertension  Influenza vaccine needed - Plan: Flu vaccine HIGH DOSE PF(Fluzone Trivalent)  Sleep disturbance  Medication management  Type 2 diabetes mellitus with hyperglycemia, without long-term current use of insulin (HCC) - Plan: POC HgB A1c  Stage 3b chronic kidney disease (HCC) So overall bpt is still labile and uncertain control    to continue in advance ht clinicn  interesting that  bp was ok  at nephrologist office .  Over I agree that her poor sleep may be contributing to over all issues   and maybe even help her bp readings.  Will plan on adding medication trials   consider trazodone , or  other .   -Patient advised to return or notify health care team  if  new concerns arise.  Patient Instructions  Good to see you.  A1c is good  5.8 today   Will involve angela and pick out a med to try to help sleep to  no interfere with all your meds .   Also   after Jan  to see if  jardiance type medication would be affordable then. That med  helps protect the kidneys .   Basil Buffin K. Billy Turvey M.D.

## 2024-07-16 NOTE — Patient Instructions (Addendum)
 Good to see you.  A1c is good  5.8 today   Will involve angela and pick out a med to try to help sleep to  no interfere with all your meds .   Also   after Jan  to see if  jardiance type medication would be affordable then. That med  helps protect the kidneys .

## 2024-07-18 ENCOUNTER — Encounter: Payer: Self-pay | Admitting: Internal Medicine

## 2024-07-19 ENCOUNTER — Telehealth: Payer: Self-pay

## 2024-07-19 NOTE — Progress Notes (Signed)
   07/19/2024  Patient ID: Kerry Walters, female   DOB: 02/10/1946, 78 y.o.   MRN: [id number]  Contacted patient to assess affordability of SGLT2.  Unfortunately, patient does not qualify for PAP and cannot afford the expensive copay at pharmacy.  Is expecting her insurance to change in Jan and we can assess cost at that time.  Also reviewed patient's med list in full at PCP request. Trazodone would be a safe sleep aid, with no drug interactions. Message sent to PCP to coordinate rx.  Jon VEAR Lindau, PharmD Clinical Pharmacist [phone]

## 2024-07-29 ENCOUNTER — Telehealth: Payer: Self-pay | Admitting: Internal Medicine

## 2024-07-29 NOTE — Telephone Encounter (Unsigned)
 Copied from CRM #[id number]. Topic: Clinical - Medication Refill >> Jul 29, 2024 12:54 PM Alfonso ORN wrote: Medication:  carvedilol  (COREG ) 25 MG tablet    Has the patient contacted their pharmacy? Yes  This is the patient's preferred pharmacy:  [facility] - [place], [place] - [address] [place] [address] [place] [address] Phone: [phone] Fax: [phone]  Is this the correct pharmacy for this prescription? Yes If no, delete pharmacy and type the correct one.   Has the prescription been filled recently? No  Is the patient out of the medication? Yes  Has the patient been seen for an appointment in the last year OR does the patient have an upcoming appointment? Yes  Can we respond through MyChart? no

## 2024-08-13 ENCOUNTER — Ambulatory Visit: Attending: Internal Medicine | Admitting: Pharmacist Clinician (PhC)/ Clinical Pharmacy Specialist

## 2024-08-13 ENCOUNTER — Encounter: Payer: Self-pay | Admitting: Pharmacist Clinician (PhC)/ Clinical Pharmacy Specialist

## 2024-08-13 VITALS — BP 154/72 | HR 74

## 2024-08-13 DIAGNOSIS — K219 Gastro-esophageal reflux disease without esophagitis: Secondary | ICD-10-CM

## 2024-08-13 DIAGNOSIS — I10 Essential (primary) hypertension: Secondary | ICD-10-CM

## 2024-08-13 MED ORDER — CLONIDINE 0.2 MG/24HR TD PTWK: 0.2000 mg | MEDICATED_PATCH | TRANSDERMAL | 12 refills | Status: AC

## 2024-08-13 MED ORDER — PANTOPRAZOLE SODIUM 40 MG PO TBEC: 40.0000 mg | DELAYED_RELEASE_TABLET | Freq: Every day | ORAL | 3 refills | Status: AC

## 2024-08-13 NOTE — Patient Instructions (Signed)
 Follow up appointment: Wednesday Feb 18 at 3:30 pm   I will have the scheduler call you to arrange appointment with Dr. Lonni for March or April.  Take your BP meds as follows: FINISH YOUR CLONIDINE  0.1 MG PATCHES, THEN INCREASE TO THE 0.2 MG PATCHES.   CONTINUE WITH ALL OTHER MEDICATIONS.  Check your blood pressure at home daily (if able) and keep record of the readings.  Your blood pressure goal is < 140/80  To check your pressure at home you will need to:  1. Sit up in a chair, with feet flat on the floor and back supported. Do not cross your ankles or legs. 2. Rest your left arm so that the cuff is about heart level. If the cuff goes on your upper arm,  then just relax the arm on the table, arm of the chair or your lap. If you have a wrist cuff, we  suggest relaxing your wrist against your chest (think of it as Pledging the Flag with the  wrong arm).  3. Place the cuff snugly around your arm, about 1 inch above the crook of your elbow. The  cords should be inside the groove of your elbow.  4. Sit quietly, with the cuff in place, for about 5 minutes. After that 5 minutes press the power  button to start a reading. 5. Do not talk or move while the reading is taking place.  6. Record your readings on a sheet of paper. Although most cuffs have a memory, it is often  easier to see a pattern developing when the numbers are all in front of you.  7. You can repeat the reading after 1-3 minutes if it is recommended  Make sure your bladder is empty and you have not had caffeine or tobacco within the last 30 min  Always bring your blood pressure log with you to your appointments. If you have not brought your monitor in to be double checked for accuracy, please bring it to your next appointment.  You can find a list of quality blood pressure cuffs at [email or web address]  Important lifestyle changes to control high blood pressure  Intervention  Effect on the BP  Lose extra pounds and  watch your waistline Weight loss is one of the most effective lifestyle changes for controlling blood pressure. If you're overweight or obese, losing even a small amount of weight can help reduce blood pressure. Blood pressure might go down by about 1 millimeter of mercury (mm Hg) with each kilogram (about 2.2 pounds) of weight lost.  Exercise regularly As a general goal, aim for at least 30 minutes of moderate physical activity every day. Regular physical activity can lower high blood pressure by about 5 to 8 mm Hg.  Eat a healthy diet Eating a diet rich in whole grains, fruits, vegetables, and low-fat dairy products and low in saturated fat and cholesterol. A healthy diet can lower high blood pressure by up to 11 mm Hg.  Reduce salt (sodium) in your diet Even a small reduction of sodium in the diet can improve heart health and reduce high blood pressure by about 5 to 6 mm Hg.  Limit alcohol One drink equals 12 ounces of beer, 5 ounces of wine, or 1.5 ounces of 80-proof liquor.  Limiting alcohol to less than one drink a day for women or two drinks a day for men can help lower blood pressure by about 4 mm Hg.   If you have any questions or concerns  please use My Chart to send questions or call the office at [phone]

## 2024-08-13 NOTE — Assessment & Plan Note (Signed)
 Will refill pantoprazole  for her today.

## 2024-08-13 NOTE — Progress Notes (Signed)
 Office Visit    Patient Name: Ramiya Delahunty Date of Encounter: 08/13/2024  Primary Care Provider:  Charlett Apolinar POUR, MD Primary Cardiologist:  Shelda Bruckner, MD  Chief Complaint    Hypertension - Advanced hypertension clinic  Past Medical History   DM2 7/25 A1c 6, on metformin  1500 mg daily  CKD Stage IIIb  SCr 1.49, GFR 33  hypothyroid 3/25 TSH WNL on levothyroxine  75   HLD 7/25 LDL 68 on atorvastatin  20 mg daily  gout 7/25 uric acid 7.8 (avoid diuretics for now)    Allergies  Allergen Reactions   Contrast Media [Iodinated Contrast Media] Shortness Of Breath    Pt states in the 80's at Urologist visit had asthma attack reaction to IVP dye. Told not to have it again.    Amlodipine  Other (See Comments)    Swelling    Clonidine  Derivatives Swelling and Other (See Comments)    Dry mouth, severe drowsiness.    Codeine Nausea Only   Shrimp [Shellfish Allergy] Swelling    Only Shrimp   Topamax [Topiramate] Rash    History of Present Illness    Thomasena Vandenheuvel is a 78 y.o. female patient who was referred to the Advanced Hypertension Clinic by Dr. Bruckner.  She was seen by Reche Finder in May, with a pressure of 180/80.  She was going through an emotional hurdle, as her husband died suddenly in December 21, 2023 after 62 years of marriage.  Caitlin further increased her doxazosin  to 4 mg each night.  She does have a history of gout, which limits our medication options some.   Secondary workup has been unremarkable.    I have seen her several times since then.  She will only occasionally check home readings, never writes them down or brings in home device for validation.  At her most recent visit I added clonidine  0.1 mg patch, and today she notes that home readings are better than they have ever been, although still above goal.   States now mostly 140-160 systolic, with no problems related to the patches.  [facility] and was  recommended to take trazodone for sleep issues, but states they never sent a prescription to her pharmacy.   She is also out of pantoprazole  and notes severe rebound reflux.    Blood Pressure Goal:  130/80  Current Medications: carvedilol  25 mg bid, hydralazine  50 mg bid, spironolactone  37.5 mg daily, valsartan  320 mg daily, clonidine  0.1 mg patch  Previously tried:   amlodipine  - edema   Clonidine  - edema Social Hx:      Tobacco:  no  Alcohol:  no  Caffeine:  coffee in the am, half sweet tea, occasional diet soda   Diet:   more home cooked meals; doesn't  eat much; usually 2 meals per day; breakfast is eggs, no more bacon, now more Cheerios, grits, bagels;  most mornings; no pastas, some meats or beans for protein, some vegetables, not regularly   Exercise: yard work, tree surgeon cookies  Home BP readings: doesn't check regularly - only random, no readings with her   Systolic 190's, diastolic WNL  Accessory Clinical Findings    Lab Results  Component Value Date   CREATININE 1.49 (H) 03/05/2024   BUN 23 03/05/2024   NA 144 03/05/2024   K 4.5 03/05/2024   CL 109 03/05/2024   CO2 29 03/05/2024   Lab Results  Component Value Date   ALT 14 03/05/2024   AST  20 03/05/2024   ALKPHOS 57 03/05/2024   BILITOT 0.4 03/05/2024   Lab Results  Component Value Date   HGBA1C 5.8 (A) 07/16/2024    Screening for Secondary Hypertension:      12/21/2023    3:00 PM  Causes  Drugs/Herbals Screened     - Comments no OTC agents, limited caffeine, estrogen cream per OB  Renovascular HTN Screened     - Comments 11/2023 renal duplex ordered  Sleep Apnea Screened     - Comments mild OSA, declines CPAP  Thyroid  Disease Screened     - Comments managed by PCP  Hyperaldosteronism Screened     - Comments no hyperaldosteronism by labs 06/2023  Pheochromocytoma Screened     - Comments 11/2023 plasma metanephrines ordered  Cushing's Syndrome N/A     - Comments non cushinoid appearance  Coarctation  of the Aorta N/A     - Comments BP symmetric  Compliance Screened    Relevant Labs/Studies:    Latest Ref Rng & Units 03/05/2024    9:13 AM 11/01/2023   10:28 AM 09/27/2023   10:23 AM  Basic Labs  Sodium 135 - 145 mEq/L 144  141  143   Potassium 3.5 - 5.1 mEq/L 4.5  4.1  4.1   Creatinine 0.40 - 1.20 mg/dL 8.50  8.60  8.70        Latest Ref Rng & Units 11/01/2023   10:28 AM 02/23/2023   10:05 AM  Thyroid    TSH 0.35 - 5.50 uIU/mL 2.97  2.20           01/19/2024   10:31 AM  Renovascular   Renal Artery US  Completed Yes      Home Medications    Current Outpatient Medications  Medication Sig Dispense Refill   cloNIDine  (CATAPRES  - DOSED IN MG/24 HR) 0.2 mg/24hr patch Place 1 patch (0.2 mg total) onto the skin once a week. 4 patch 12   acetaminophen  (TYLENOL ) 500 MG tablet Take 500-1,000 mg by mouth every 6 (six) hours as needed for moderate pain.     atorvastatin  (LIPITOR) 20 MG tablet TAKE 1 TABLET BY MOUTH IN THE  EVENING 100 tablet 2   carvedilol  (COREG ) 25 MG tablet Take 1 tablet (25 mg total) by mouth 2 (two) times daily with a meal. 180 tablet 3   cetirizine (ZYRTEC) 10 MG tablet Take 10 mg by mouth daily as needed for allergies.     Cholecalciferol (VITAMIN D) 50 MCG (2000 UT) tablet Take 6,000 Units by mouth daily.     conjugated estrogens  (PREMARIN ) vaginal cream Place vaginally as directed. Use 2-3 times weekly  prnPlace 1 Applicatorful vaginally as directed. Use 2-3 times weekly  prn (Patient taking differently: Place vaginally as directed. 1 Applicatorful vaginally as directed. Use 2-3 times weekly  prn) 42 g 4   Cyanocobalamin  (B-12) 3000 MCG CAPS Take 3,000 mcg by mouth daily.     ferrous sulfate  325 (65 FE) MG tablet Take 1 tablet (325 mg total) by mouth every other day. (Patient taking differently: Take 325 mg by mouth daily.) 15 tablet 11   fluticasone (FLONASE) 50 MCG/ACT nasal spray Place 1 spray into both nostrils daily as needed for allergies or rhinitis.      hydrALAZINE  (APRESOLINE ) 50 MG tablet Take 1 tablet (50 mg total) by mouth 3 (three) times daily. 60 tablet 3   latanoprost (XALATAN) 0.005 % ophthalmic solution Place 1 drop into both eyes at bedtime.     levothyroxine  (  SYNTHROID ) 75 MCG tablet Take 1 tablet (75 mcg total) by mouth daily. 100 tablet 2   magnesium oxide (MAG-OX) 400 (240 Mg) MG tablet Take 800 mg by mouth daily.     metFORMIN  (GLUCOPHAGE -XR) 500 MG 24 hr tablet TAKE 3 TABLETS BY MOUTH DAILY 300 tablet 2   OVER THE COUNTER MEDICATION Take 1 tablet by mouth daily. Total Beets supplements     pantoprazole  (PROTONIX ) 40 MG tablet Take 1 tablet (40 mg total) by mouth daily. 100 tablet 3   spironolactone  (ALDACTONE ) 25 MG tablet Take 1.5 tablets (37.5 mg total) by mouth daily. 135 tablet 3   Turmeric 500 MG CAPS Take 1,000 mg by mouth daily.     valsartan  (DIOVAN ) 320 MG tablet Take 1 tablet (320 mg total) by mouth daily. 90 tablet 3   Current Facility-Administered Medications  Medication Dose Route Frequency Provider Last Rate Last Admin   betamethasone  acetate-betamethasone  sodium phosphate  (CELESTONE ) injection 3 mg  3 mg Intramuscular Once Evans, Brent M, DPM       betamethasone  acetate-betamethasone  sodium phosphate  (CELESTONE ) injection 3 mg  3 mg Intramuscular Once Janit Thresa HERO, DPM         Assessment & Plan   HYPERTENSION CONTROL Vitals:   08/13/24 1534 08/13/24 1537  BP: (!) 160/74 (!) 154/72    The patient's blood pressure is elevated above target today.  In order to address the patient's elevated BP: A current anti-hypertensive medication was adjusted today.; Blood pressure will be monitored at home to determine if medication changes need to be made.      Essential hypertension Assessment: BP is uncontrolled in office BP 154/72 mmHg;  above the goal (<130/80). Noted improvement since adding clonidine  patch Tolerates clonidine , carvedilol , hydralazine , spironolactone  and valsartan  well, without any side  effects Denies SOB, palpitation, chest pain, headaches,or swelling Reiterated the importance of regular exercise and low salt diet   Plan:  Finish current supply of clonidine  0.1 mg patches, when done start the 0.2 mg dose - GoodRx coupon was printed Continue taking carvedilol  25 mg bid, hydralazine  50 mg bid, spironolactone  37.5 mg daily, valsartan  320 mg daily Patient to keep record of BP readings with heart rate and report to us  at the next visit Patient to follow up with me in 2 months, Dr. Lonni in 4 months.    Labs ordered today:  none   GERD Will refill pantoprazole  for her today.    Leam Madero PharmD CPP [facility]  [address] [place], [place] [address] [phone]

## 2024-08-13 NOTE — Assessment & Plan Note (Signed)
 Assessment: BP is uncontrolled in office BP 154/72 mmHg;  above the goal (<130/80). Noted improvement since adding clonidine  patch Tolerates clonidine , carvedilol , hydralazine , spironolactone  and valsartan  well, without any side effects Denies SOB, palpitation, chest pain, headaches,or swelling Reiterated the importance of regular exercise and low salt diet   Plan:  Finish current supply of clonidine  0.1 mg patches, when done start the 0.2 mg dose - GoodRx coupon was printed Continue taking carvedilol  25 mg bid, hydralazine  50 mg bid, spironolactone  37.5 mg daily, valsartan  320 mg daily Patient to keep record of BP readings with heart rate and report to us  at the next visit Patient to follow up with me in 2 months, Dr. Lonni in 4 months.    Labs ordered today:  none

## 2024-08-16 LAB — HM MAMMOGRAPHY

## 2024-08-20 ENCOUNTER — Encounter: Payer: Self-pay | Admitting: Internal Medicine

## 2024-08-30 ENCOUNTER — Other Ambulatory Visit (HOSPITAL_BASED_OUTPATIENT_CLINIC_OR_DEPARTMENT_OTHER): Payer: Self-pay | Admitting: Family

## 2024-08-30 DIAGNOSIS — I10 Essential (primary) hypertension: Secondary | ICD-10-CM

## 2024-09-04 ENCOUNTER — Other Ambulatory Visit

## 2024-09-04 VITALS — BP 133/63

## 2024-09-04 DIAGNOSIS — G479 Sleep disorder, unspecified: Secondary | ICD-10-CM

## 2024-09-04 DIAGNOSIS — I10 Essential (primary) hypertension: Secondary | ICD-10-CM

## 2024-09-04 MED ORDER — TRAZODONE HCL 50 MG PO TABS: 25.0000 mg | ORAL_TABLET | Freq: Every evening | ORAL | 2 refills | Status: AC | PRN

## 2024-09-04 NOTE — Progress Notes (Signed)
 "  09/04/2024 Name: Kerry Walters MRN: [id number] DOB: 1945/10/14  Chief Complaint  Patient presents with   Medication Management   Hypertension    Kerry Walters is a 79 y.o. year old female who presented for a telephone visit.   They were referred to the pharmacist by their PCP for assistance in managing hypertension and complex medication management.    Subjective:  Care Team: Primary Care Provider: Charlett Kerry POUR, MD ; Next Scheduled Visit: 11/20/24  Medication Access/Adherence  Current Pharmacy:  [facility] - [place], [place] - [address] [facility] [address] [place] [address] [place] [address] Phone: [phone] Fax: [phone]  [facility] - [place], [place] - [address] [place] [address] Phone: [phone] Fax: [phone]  [id number] [place], [place] - [address] [facility] [address] [facility] [address] [place] [address] Phone: [phone] Fax: [phone]   Patient reports affordability concerns with their medications: No  Patient reports access/transportation concerns to their pharmacy: No  Patient reports adherence concerns with their medications:  No  (but fill dates suggest otherwise, states excess on hand)   Hypertension:  Current medications: Carvedilol  25mg  BID, Valsartan  320mg , spironolactone  25mg  1 and 1/2 tabs daily, hydralazine  50mg  TID Medications previously tried: Amlodipine  (swelling), Clonidine  (swelling in mouth), Triamterene /hydrochlorothiazide , Losartan   Patient has a validated, automated, upper arm home BP cuff Current blood pressure readings readings: Not checking routinely, did check this morning and was 133/63  Patient denies hypotensive s/sx including dizziness, lightheadedness.  Patient denies hypertensive symptoms including headache, chest pain, shortness of breath  Reports she feels she is on too much HTN  medication and that her BP is falsely elevated at dr office, like white coat syndrome  Sleep -Not sleeping well at night -Still interested in trying trazodone  for sleep    Objective:  Lab Results  Component Value Date   HGBA1C 5.8 (A) 07/16/2024    Lab Results  Component Value Date   CREATININE 1.49 (H) 03/05/2024   BUN 23 03/05/2024   NA 144 03/05/2024   K 4.5 03/05/2024   CL 109 03/05/2024   CO2 29 03/05/2024    Lab Results  Component Value Date   CHOL 129 03/05/2024   HDL 44.80 03/05/2024   LDLCALC 68 03/05/2024   LDLDIRECT 101.0 03/04/2019   TRIG 80.0 03/05/2024   CHOLHDL 3 03/05/2024    Medications Reviewed Today     Reviewed by Kerry Walters, RPH (Pharmacist) on 09/04/24 at [phone]  Med List Status: <None>   Medication Order Taking? Sig Documenting Provider Last Dose Status Informant  acetaminophen  (TYLENOL ) 500 MG tablet [id number]  Take 500-1,000 mg by mouth every 6 (six) hours as needed for moderate pain. [provider]  Active Self  atorvastatin  (LIPITOR) 20 MG tablet [id number]  TAKE 1 TABLET BY MOUTH IN THE  EVENING Walters, Kerry K, MD  Active   betamethasone  acetate-betamethasone  sodium phosphate  (CELESTONE ) injection 3 mg [id number]   Kerry Walters, DPM  Active   betamethasone  acetate-betamethasone  sodium phosphate  (CELESTONE ) injection 3 mg [id number]   Kerry Walters, DPM  Active   carvedilol  (COREG ) 25 MG tablet [id number]  Take 1 tablet (25 mg total) by mouth 2 (two) times daily with a meal. Walters, Kerry POUR, MD  Active   cetirizine (ZYRTEC) 10 MG tablet [id number]  Take 10 mg by mouth daily as needed for allergies.  [provider]  Active Self  Cholecalciferol (VITAMIN D) 50 MCG (2000 UT) tablet [id number]  Take 6,000 Units by mouth daily. [provider]  Active Self  cloNIDine  (CATAPRES  - DOSED IN MG/24 HR) 0.2 mg/24hr patch [id number]  Place 1 patch (0.2 mg total) onto the skin once a week. Kerry Slain, MD  Active    conjugated estrogens  (PREMARIN ) vaginal cream [id number]  Place vaginally as directed. Use 2-3 times weekly  prnPlace 1 Applicatorful vaginally as directed. Use 2-3 times weekly  prn  Patient taking differently: Place vaginally as directed. 1 Applicatorful vaginally as directed. Use 2-3 times weekly  prn   Walters, Kerry K, MD  Active Self  Cyanocobalamin  (B-12) 3000 MCG CAPS [id number]  Take 3,000 mcg by mouth daily. [provider]  Active Self  ferrous sulfate  325 (65 FE) MG tablet [id number]  Take 1 tablet (325 mg total) by mouth every other day.  Patient taking differently: Take 325 mg by mouth daily.   Kerry Iha, MD  Active Self  fluticasone [facility]) 50 MCG/ACT nasal spray [id number]  Place 1 spray into both nostrils daily as needed for allergies or rhinitis. [provider]  Active Self  hydrALAZINE  (APRESOLINE ) 50 MG tablet [id number]  Take 1 tablet (50 mg total) by mouth 3 (three) times daily. Kerry Riggs, MD  Active   latanoprost (XALATAN) 0.005 % ophthalmic solution [id number]  Place 1 drop into both eyes at bedtime. [provider]  Active Self  levothyroxine  (SYNTHROID ) 75 MCG tablet [id number]  Take 1 tablet (75 mcg total) by mouth daily. Walters, Kerry POUR, MD  Active   magnesium oxide (MAG-OX) 400 (240 Mg) MG tablet [id number]  Take 800 mg by mouth daily. [provider]  Active Self  metFORMIN  (GLUCOPHAGE -XR) 500 MG 24 hr tablet [id number]  TAKE 3 TABLETS BY MOUTH DAILY Walters, Kerry K, MD  Active   OVER THE COUNTER MEDICATION [id number]  Take 1 tablet by mouth daily. Total Beets supplements [provider]  Active Self  pantoprazole  (PROTONIX ) 40 MG tablet [id number]  Take 1 tablet (40 mg total) by mouth daily. Kerry Slain, MD  Active   spironolactone  (ALDACTONE ) 25 MG tablet [id number]  Take 1.5 tablets (37.5 mg total) by mouth daily. Kerry Slain, MD  Active   Turmeric 500 MG CAPS [id number]  Take 1,000 mg by mouth  daily. [provider]  Active Self  valsartan  (DIOVAN ) 320 MG tablet [id number]  Take 1 tablet (320 mg total) by mouth daily. Walters, Kerry POUR, MD  Active               Assessment/Plan:   Hypertension: - Currently uncontrolled but controlled reading at home today - Reviewed long term cardiovascular and renal outcomes of uncontrolled blood pressure - Reviewed appropriate blood pressure monitoring technique and reviewed goal blood pressure. Recommended to check home blood pressure and heart rate daily preferably but minimum of 2-3x/week and keep a log -Continue current med therapy  Sleep: -Start trazodone  50mg  1/2 to 1 tab by mouth at bedtime as needed for sleep, okay to send per PCP     Follow Up Plan: 4 weeks  Jon VEAR Lindau, PharmD Clinical Pharmacist [phone]    "

## 2024-10-02 ENCOUNTER — Other Ambulatory Visit

## 2024-10-02 DIAGNOSIS — G479 Sleep disorder, unspecified: Secondary | ICD-10-CM

## 2024-10-02 NOTE — Progress Notes (Signed)
 "  10/02/2024 Name: Kerry Walters MRN: [id number] DOB: 02-06-1946  Chief Complaint  Patient presents with   Medication Management   Hypertension    Kerry Walters is a 79 y.o. year old female who presented for a telephone visit.   They were referred to the pharmacist by their PCP for assistance in managing hypertension and complex medication management.    Subjective:  Care Team: Primary Care Provider: Charlett Apolinar POUR, MD ; Next Scheduled Visit: 11/20/24  Medication Access/Adherence  Current Pharmacy:  [facility] - [place], [place] - [address] [facility] [address] [place] [address] [place] [address] Phone: [phone] Fax: [phone]  [facility] - [place], [place] - [address] [place]  [address] Phone: [phone] Fax: [phone]  [id number] [place], [place] - [address] AT [facility] [address] [place] [address] Phone: [phone] Fax: [phone]   Patient reports affordability concerns with their medications: No  Patient reports access/transportation concerns to their pharmacy: No  Patient reports adherence concerns with their medications:  No  (but fill dates suggest otherwise, states excess on hand)   Hypertension:  Current medications: Carvedilol  25mg  BID, Valsartan  320mg , spironolactone  25mg  1 and 1/2 tabs daily, hydralazine  50mg  TID Medications previously tried: Amlodipine  (swelling), Clonidine  (swelling in mouth), Triamterene /hydrochlorothiazide , Losartan   Patient has a validated, automated, upper arm home BP cuff Current blood pressure readings readings: Reports checking almost every day and keeping a log. Reports that she will see good readings for a few days and then it will randomly be high again one morning. Elevated today in the 160s for SBP, denies any sx of HTN  Patient denies hypotensive s/sx including dizziness,  lightheadedness.  Patient denies hypertensive symptoms including headache, chest pain, shortness of breath  Reports she feels she is on too much HTN medication and that her BP is falsely elevated at dr office, like white coat syndrome. States adamantly today that she refuses to take any more different meds for her BP at this time, no matter what cardio says on 2/18 appt  Sleep -Not sleeping well at night -Tried trazodone  50mg  (full tablet) for one night, did not like how groggy she felt the next morning so she did not take it again    Objective:  Lab Results  Component Value Date   HGBA1C 5.8 (A) 07/16/2024    Lab Results  Component Value Date   CREATININE 1.49 (H) 03/05/2024   BUN 23 03/05/2024   NA 144 03/05/2024   K 4.5 03/05/2024   CL 109 03/05/2024   CO2 29 03/05/2024    Lab Results  Component Value Date   CHOL 129 03/05/2024   HDL 44.80 03/05/2024   LDLCALC 68 03/05/2024   LDLDIRECT 101.0 03/04/2019   TRIG 80.0 03/05/2024   CHOLHDL 3 03/05/2024    Medications Reviewed Today     Reviewed by [name], RPH (Pharmacist) on 10/02/24 at [phone]  Med List Status: <None>   Medication Order Taking? Sig Documenting Provider Last Dose Status Informant  acetaminophen  (TYLENOL ) 500 MG tablet [id number]  Take 500-1,000 mg by mouth every 6 (six) hours as needed for moderate pain. [provider]  Active Self  atorvastatin  (LIPITOR) 20 MG tablet [id number]  TAKE 1 TABLET BY MOUTH IN THE  EVENING Panosh, Wanda K, MD  Active   betamethasone  acetate-betamethasone  sodium phosphate  (CELESTONE ) injection 3 mg [id number]  Janit Thresa HERO, DPM  Active   betamethasone  acetate-betamethasone  sodium phosphate  (CELESTONE ) injection 3 mg [id number]   Janit Thresa HERO, DPM  Active   carvedilol  (COREG ) 25 MG tablet [id number]  Take 1 tablet (25 mg total) by mouth 2 (two) times daily with a meal. Panosh, Apolinar POUR, MD  Active   cetirizine (ZYRTEC) 10 MG tablet [id number]  Take 10 mg by  mouth daily as needed for allergies. [provider]  Active Self  Cholecalciferol (VITAMIN D) 50 MCG (2000 UT) tablet [id number]  Take 6,000 Units by mouth daily. [provider]  Active Self  cloNIDine  (CATAPRES  - DOSED IN MG/24 HR) 0.2 mg/24hr patch [id number]  Place 1 patch (0.2 mg total) onto the skin once a week. Lonni Slain, MD  Active   conjugated estrogens  (PREMARIN ) vaginal cream [id number]  Place vaginally as directed. Use 2-3 times weekly  prnPlace 1 Applicatorful vaginally as directed. Use 2-3 times weekly  prn  Patient taking differently: Place vaginally as directed. 1 Applicatorful vaginally as directed. Use 2-3 times weekly  prn   Panosh, Wanda K, MD  Active Self  Cyanocobalamin  (B-12) 3000 MCG CAPS [id number]  Take 3,000 mcg by mouth daily. [provider]  Active Self  ferrous sulfate  325 (65 FE) MG tablet [id number]  Take 1 tablet (325 mg total) by mouth every other day.  Patient taking differently: Take 325 mg by mouth daily.   Eda Iha, MD  Active Self  fluticasone [facility]) 50 MCG/ACT nasal spray [id number]  Place 1 spray into both nostrils daily as needed for allergies or rhinitis. [provider]  Active Self  hydrALAZINE  (APRESOLINE ) 50 MG tablet [id number]  Take 1 tablet (50 mg total) by mouth 3 (three) times daily. Raford Riggs, MD  Active   latanoprost (XALATAN) 0.005 % ophthalmic solution [id number]  Place 1 drop into both eyes at bedtime. [provider]  Active Self  levothyroxine  (SYNTHROID ) 75 MCG tablet [id number]  Take 1 tablet (75 mcg total) by mouth daily. Panosh, Apolinar POUR, MD  Active   magnesium oxide (MAG-OX) 400 (240 Mg) MG tablet [id number]  Take 800 mg by mouth daily. [provider]  Active Self  metFORMIN  (GLUCOPHAGE -XR) 500 MG 24 hr tablet [id number]  TAKE 3 TABLETS BY MOUTH DAILY Panosh, Wanda K, MD  Active   OVER THE COUNTER MEDICATION [id number]  Take 1 tablet by mouth daily. Total Beets  supplements [provider]  Active Self  pantoprazole  (PROTONIX ) 40 MG tablet [id number]  Take 1 tablet (40 mg total) by mouth daily. Lonni Slain, MD  Active   spironolactone  (ALDACTONE ) 25 MG tablet [id number]  Take 1.5 tablets (37.5 mg total) by mouth daily. Lonni Slain, MD  Active   traZODone  (DESYREL ) 50 MG tablet [id number]  Take 0.5-1 tablets (25-50 mg total) by mouth at bedtime as needed for sleep. Panosh, Wanda K, MD  Active   Turmeric 500 MG CAPS [id number]  Take 1,000 mg by mouth daily. [provider]  Active Self  valsartan  (DIOVAN ) 320 MG tablet [id number]  Take 1 tablet (320 mg total) by mouth daily. Panosh, Apolinar POUR, MD  Active               Assessment/Plan:   Hypertension: - Currently uncontrolled but controlled reading at home today - Reviewed long term cardiovascular and renal outcomes of uncontrolled blood pressure - Reviewed appropriate blood pressure monitoring technique and reviewed goal blood pressure. Recommended to check home blood pressure and heart rate daily preferably  but minimum of 2-3x/week and keep a log -Continue current med therapy  Sleep: -Start trazodone  25mg  again prn sleep, instead of going straight to a full tablet      Follow Up Plan: 4 weeks  Jon VEAR Lindau, PharmD Clinical Pharmacist [phone]    "

## 2024-10-16 ENCOUNTER — Ambulatory Visit: Payer: Self-pay | Admitting: Pharmacist Clinician (PhC)/ Clinical Pharmacy Specialist

## 2024-10-30 ENCOUNTER — Other Ambulatory Visit

## 2024-11-14 ENCOUNTER — Ambulatory Visit: Admitting: Internal Medicine

## 2024-11-20 ENCOUNTER — Ambulatory Visit: Admitting: Internal Medicine
